# Patient Record
Sex: Male | Born: 1935 | Race: White | Hispanic: No | Marital: Married | Smoking: Former smoker
Health system: Southern US, Community
[De-identification: ages and names within clinical notes are randomized; demographics above are authoritative.]

## PROBLEM LIST (undated history)

## (undated) DIAGNOSIS — K219 Gastro-esophageal reflux disease without esophagitis: Secondary | ICD-10-CM

## (undated) DIAGNOSIS — I2510 Atherosclerotic heart disease of native coronary artery without angina pectoris: Secondary | ICD-10-CM

## (undated) DIAGNOSIS — D649 Anemia, unspecified: Secondary | ICD-10-CM

## (undated) DIAGNOSIS — I38 Endocarditis, valve unspecified: Secondary | ICD-10-CM

## (undated) DIAGNOSIS — K2270 Barrett's esophagus without dysplasia: Secondary | ICD-10-CM

## (undated) DIAGNOSIS — G5600 Carpal tunnel syndrome, unspecified upper limb: Secondary | ICD-10-CM

## (undated) DIAGNOSIS — I480 Paroxysmal atrial fibrillation: Secondary | ICD-10-CM

## (undated) DIAGNOSIS — Z972 Presence of dental prosthetic device (complete) (partial): Secondary | ICD-10-CM

## (undated) DIAGNOSIS — M6740 Ganglion, unspecified site: Secondary | ICD-10-CM

## (undated) DIAGNOSIS — K449 Diaphragmatic hernia without obstruction or gangrene: Secondary | ICD-10-CM

## (undated) DIAGNOSIS — I739 Peripheral vascular disease, unspecified: Secondary | ICD-10-CM

## (undated) DIAGNOSIS — H9190 Unspecified hearing loss, unspecified ear: Secondary | ICD-10-CM

## (undated) DIAGNOSIS — I779 Disorder of arteries and arterioles, unspecified: Secondary | ICD-10-CM

## (undated) DIAGNOSIS — Z973 Presence of spectacles and contact lenses: Secondary | ICD-10-CM

## (undated) DIAGNOSIS — Z7901 Long term (current) use of anticoagulants: Secondary | ICD-10-CM

## (undated) DIAGNOSIS — R001 Bradycardia, unspecified: Secondary | ICD-10-CM

## (undated) DIAGNOSIS — N400 Enlarged prostate without lower urinary tract symptoms: Secondary | ICD-10-CM

## (undated) DIAGNOSIS — D126 Benign neoplasm of colon, unspecified: Secondary | ICD-10-CM

## (undated) DIAGNOSIS — K648 Other hemorrhoids: Secondary | ICD-10-CM

## (undated) DIAGNOSIS — E039 Hypothyroidism, unspecified: Secondary | ICD-10-CM

## (undated) DIAGNOSIS — Z950 Presence of cardiac pacemaker: Secondary | ICD-10-CM

## (undated) DIAGNOSIS — I4892 Unspecified atrial flutter: Secondary | ICD-10-CM

## (undated) DIAGNOSIS — I5032 Chronic diastolic (congestive) heart failure: Secondary | ICD-10-CM

## (undated) DIAGNOSIS — G4733 Obstructive sleep apnea (adult) (pediatric): Secondary | ICD-10-CM

## (undated) DIAGNOSIS — M1990 Unspecified osteoarthritis, unspecified site: Secondary | ICD-10-CM

## (undated) DIAGNOSIS — I10 Essential (primary) hypertension: Secondary | ICD-10-CM

## (undated) DIAGNOSIS — K4090 Unilateral inguinal hernia, without obstruction or gangrene, not specified as recurrent: Secondary | ICD-10-CM

## (undated) DIAGNOSIS — M810 Age-related osteoporosis without current pathological fracture: Secondary | ICD-10-CM

## (undated) DIAGNOSIS — E785 Hyperlipidemia, unspecified: Secondary | ICD-10-CM

## (undated) DIAGNOSIS — D509 Iron deficiency anemia, unspecified: Secondary | ICD-10-CM

## (undated) HISTORY — DX: Benign neoplasm of colon, unspecified: D12.6

## (undated) HISTORY — DX: Anemia, unspecified: D64.9

## (undated) HISTORY — DX: Hyperlipidemia, unspecified: E78.5

## (undated) HISTORY — DX: Atherosclerotic heart disease of native coronary artery without angina pectoris: I25.10

## (undated) HISTORY — DX: Essential (primary) hypertension: I10

## (undated) HISTORY — DX: Paroxysmal atrial fibrillation: I48.0

## (undated) HISTORY — DX: Barrett's esophagus without dysplasia: K22.70

## (undated) HISTORY — DX: Iron deficiency anemia, unspecified: D50.9

## (undated) HISTORY — PX: COLONOSCOPY W/ BIOPSIES AND POLYPECTOMY: SHX1376

## (undated) HISTORY — DX: Unspecified atrial flutter: I48.92

## (undated) HISTORY — DX: Long term (current) use of anticoagulants: Z79.01

## (undated) HISTORY — DX: Peripheral vascular disease, unspecified: I73.9

## (undated) HISTORY — DX: Disorder of arteries and arterioles, unspecified: I77.9

## (undated) HISTORY — PX: KNEE ARTHROSCOPY: SUR90

## (undated) HISTORY — DX: Endocarditis, valve unspecified: I38

## (undated) HISTORY — DX: Obstructive sleep apnea (adult) (pediatric): G47.33

## (undated) HISTORY — PX: HERNIA REPAIR: SHX51

## (undated) HISTORY — PX: AORTIC VALVE REPLACEMENT: SHX41

## (undated) HISTORY — PX: TOTAL HIP ARTHROPLASTY: SHX124

## (undated) HISTORY — DX: Age-related osteoporosis without current pathological fracture: M81.0

## (undated) HISTORY — DX: Benign prostatic hyperplasia without lower urinary tract symptoms: N40.0

## (undated) HISTORY — DX: Carpal tunnel syndrome, unspecified upper limb: G56.00

## (undated) HISTORY — DX: Chronic diastolic (congestive) heart failure: I50.32

## (undated) HISTORY — DX: Bradycardia, unspecified: R00.1

## (undated) HISTORY — DX: Diaphragmatic hernia without obstruction or gangrene: K44.9

## (undated) HISTORY — PX: PATELLA FRACTURE SURGERY: SHX735

## (undated) HISTORY — DX: Other hemorrhoids: K64.8

## (undated) HISTORY — DX: Hypothyroidism, unspecified: E03.9

---
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
## 2006-04-29 ENCOUNTER — Ambulatory Visit: Payer: Self-pay | Admitting: Cardiology

## 2006-05-06 ENCOUNTER — Ambulatory Visit: Payer: Self-pay | Admitting: Cardiology

## 2006-05-12 ENCOUNTER — Inpatient Hospital Stay (HOSPITAL_BASED_OUTPATIENT_CLINIC_OR_DEPARTMENT_OTHER): Admission: RE | Admit: 2006-05-12 | Discharge: 2006-05-12 | Payer: Self-pay | Admitting: Cardiology

## 2006-05-12 ENCOUNTER — Ambulatory Visit: Payer: Self-pay | Admitting: Cardiology

## 2006-05-31 ENCOUNTER — Ambulatory Visit: Payer: Self-pay | Admitting: Cardiology

## 2006-06-04 ENCOUNTER — Ambulatory Visit: Payer: Self-pay | Admitting: Cardiology

## 2006-07-27 HISTORY — PX: CORONARY ARTERY BYPASS GRAFT: SHX141

## 2006-09-13 ENCOUNTER — Ambulatory Visit: Payer: Self-pay | Admitting: Cardiology

## 2006-09-14 ENCOUNTER — Ambulatory Visit: Payer: Self-pay | Admitting: Cardiology

## 2006-09-24 ENCOUNTER — Ambulatory Visit: Payer: Self-pay | Admitting: Cardiology

## 2006-10-11 ENCOUNTER — Ambulatory Visit: Payer: Self-pay | Admitting: Cardiology

## 2006-10-12 ENCOUNTER — Ambulatory Visit: Payer: Self-pay | Admitting: Cardiology

## 2006-11-23 ENCOUNTER — Ambulatory Visit: Payer: Self-pay | Admitting: Cardiology

## 2007-05-17 ENCOUNTER — Ambulatory Visit: Payer: Self-pay | Admitting: Cardiology

## 2007-05-24 ENCOUNTER — Ambulatory Visit: Payer: Self-pay | Admitting: Cardiology

## 2007-07-28 HISTORY — PX: CARPAL TUNNEL RELEASE: SHX101

## 2007-07-28 HISTORY — PX: PACEMAKER INSERTION: SHX728

## 2007-08-11 ENCOUNTER — Ambulatory Visit: Payer: Self-pay | Admitting: Cardiology

## 2007-11-21 ENCOUNTER — Ambulatory Visit: Payer: Self-pay | Admitting: Cardiology

## 2008-01-07 ENCOUNTER — Encounter: Payer: Self-pay | Admitting: Cardiology

## 2008-01-09 ENCOUNTER — Ambulatory Visit: Payer: Self-pay | Admitting: Cardiology

## 2008-01-09 ENCOUNTER — Ambulatory Visit: Payer: Self-pay | Admitting: Internal Medicine

## 2008-01-09 ENCOUNTER — Inpatient Hospital Stay (HOSPITAL_COMMUNITY): Admission: AD | Admit: 2008-01-09 | Discharge: 2008-01-11 | Payer: Self-pay | Admitting: Cardiology

## 2008-01-23 ENCOUNTER — Ambulatory Visit: Payer: Self-pay

## 2008-02-02 ENCOUNTER — Ambulatory Visit: Payer: Self-pay | Admitting: Cardiology

## 2008-02-28 ENCOUNTER — Ambulatory Visit (HOSPITAL_BASED_OUTPATIENT_CLINIC_OR_DEPARTMENT_OTHER): Admission: RE | Admit: 2008-02-28 | Discharge: 2008-02-28 | Payer: Self-pay | Admitting: Orthopedic Surgery

## 2008-03-13 ENCOUNTER — Ambulatory Visit: Payer: Self-pay | Admitting: Cardiology

## 2008-04-20 ENCOUNTER — Ambulatory Visit: Payer: Self-pay | Admitting: Internal Medicine

## 2008-06-28 ENCOUNTER — Ambulatory Visit: Payer: Self-pay | Admitting: Cardiology

## 2008-07-02 ENCOUNTER — Encounter: Payer: Self-pay | Admitting: Cardiology

## 2008-07-02 LAB — CONVERTED CEMR LAB
ALT: 15 units/L
AST: 19 units/L
Alkaline Phosphatase: 65 units/L
Total Bilirubin: 0.8 mg/dL
Total Protein: 6.6 g/dL

## 2008-07-10 ENCOUNTER — Encounter: Payer: Self-pay | Admitting: Cardiology

## 2008-07-12 ENCOUNTER — Ambulatory Visit: Payer: Self-pay | Admitting: Cardiology

## 2008-08-13 ENCOUNTER — Ambulatory Visit: Payer: Self-pay | Admitting: Cardiology

## 2008-08-17 ENCOUNTER — Ambulatory Visit: Payer: Self-pay | Admitting: Internal Medicine

## 2008-10-15 ENCOUNTER — Encounter: Payer: Self-pay | Admitting: Cardiology

## 2008-12-25 ENCOUNTER — Encounter: Payer: Self-pay | Admitting: Cardiology

## 2009-01-11 ENCOUNTER — Encounter: Payer: Self-pay | Admitting: Cardiology

## 2009-01-23 ENCOUNTER — Ambulatory Visit: Payer: Self-pay | Admitting: Cardiology

## 2009-02-01 ENCOUNTER — Encounter (INDEPENDENT_AMBULATORY_CARE_PROVIDER_SITE_OTHER): Payer: Self-pay | Admitting: *Deleted

## 2009-02-12 ENCOUNTER — Encounter: Payer: Self-pay | Admitting: Cardiology

## 2009-02-25 ENCOUNTER — Encounter (INDEPENDENT_AMBULATORY_CARE_PROVIDER_SITE_OTHER): Payer: Self-pay | Admitting: *Deleted

## 2009-04-10 DIAGNOSIS — I4892 Unspecified atrial flutter: Secondary | ICD-10-CM | POA: Insufficient documentation

## 2009-04-10 DIAGNOSIS — I429 Cardiomyopathy, unspecified: Secondary | ICD-10-CM | POA: Insufficient documentation

## 2009-04-10 DIAGNOSIS — I2510 Atherosclerotic heart disease of native coronary artery without angina pectoris: Secondary | ICD-10-CM | POA: Insufficient documentation

## 2009-04-10 DIAGNOSIS — I4891 Unspecified atrial fibrillation: Secondary | ICD-10-CM | POA: Insufficient documentation

## 2009-04-10 DIAGNOSIS — I6529 Occlusion and stenosis of unspecified carotid artery: Secondary | ICD-10-CM | POA: Insufficient documentation

## 2009-04-10 DIAGNOSIS — I119 Hypertensive heart disease without heart failure: Secondary | ICD-10-CM | POA: Insufficient documentation

## 2009-04-10 DIAGNOSIS — I498 Other specified cardiac arrhythmias: Secondary | ICD-10-CM | POA: Insufficient documentation

## 2009-06-07 ENCOUNTER — Telehealth (INDEPENDENT_AMBULATORY_CARE_PROVIDER_SITE_OTHER): Payer: Self-pay | Admitting: *Deleted

## 2009-06-11 ENCOUNTER — Ambulatory Visit (HOSPITAL_BASED_OUTPATIENT_CLINIC_OR_DEPARTMENT_OTHER): Admission: RE | Admit: 2009-06-11 | Discharge: 2009-06-11 | Payer: Self-pay | Admitting: Orthopedic Surgery

## 2009-06-11 ENCOUNTER — Encounter (INDEPENDENT_AMBULATORY_CARE_PROVIDER_SITE_OTHER): Payer: Self-pay | Admitting: Orthopedic Surgery

## 2009-08-26 ENCOUNTER — Ambulatory Visit: Payer: Self-pay | Admitting: Cardiology

## 2009-08-30 ENCOUNTER — Encounter (INDEPENDENT_AMBULATORY_CARE_PROVIDER_SITE_OTHER): Payer: Self-pay | Admitting: *Deleted

## 2009-08-30 ENCOUNTER — Telehealth (INDEPENDENT_AMBULATORY_CARE_PROVIDER_SITE_OTHER): Payer: Self-pay | Admitting: *Deleted

## 2009-09-05 ENCOUNTER — Encounter: Payer: Self-pay | Admitting: Cardiology

## 2009-09-05 ENCOUNTER — Ambulatory Visit: Payer: Self-pay | Admitting: Cardiology

## 2009-09-12 ENCOUNTER — Encounter (INDEPENDENT_AMBULATORY_CARE_PROVIDER_SITE_OTHER): Payer: Self-pay | Admitting: *Deleted

## 2009-09-25 ENCOUNTER — Encounter: Payer: Self-pay | Admitting: Cardiology

## 2009-10-11 ENCOUNTER — Ambulatory Visit: Payer: Self-pay | Admitting: Internal Medicine

## 2009-10-14 ENCOUNTER — Encounter: Payer: Self-pay | Admitting: Internal Medicine

## 2009-10-14 LAB — CONVERTED CEMR LAB
ALT: 21 units/L (ref 0–53)
AST: 24 units/L (ref 0–37)
Albumin: 3.8 g/dL (ref 3.5–5.2)
Alkaline Phosphatase: 67 units/L (ref 39–117)
BUN: 19 mg/dL (ref 6–23)
Bilirubin, Direct: 0.1 mg/dL (ref 0.0–0.3)
CO2: 30 meq/L (ref 19–32)
Calcium: 9.2 mg/dL (ref 8.4–10.5)
Chloride: 104 meq/L (ref 96–112)
Creatinine, Ser: 1.1 mg/dL (ref 0.4–1.5)
Free T4: 0.9 ng/dL (ref 0.6–1.6)
GFR calc non Af Amer: 68.71 mL/min (ref >60)
Glucose, Bld: 83 mg/dL (ref 70–99)
Potassium: 4 meq/L (ref 3.5–5.1)
Sodium: 140 meq/L (ref 135–145)
TSH: 6.19 microintl units/mL — ABNORMAL HIGH (ref 0.35–5.50)
Total Bilirubin: 0.6 mg/dL (ref 0.3–1.2)
Total Protein: 6.8 g/dL (ref 6.0–8.3)

## 2009-11-01 ENCOUNTER — Encounter: Payer: Self-pay | Admitting: Cardiology

## 2009-11-01 DIAGNOSIS — G4733 Obstructive sleep apnea (adult) (pediatric): Secondary | ICD-10-CM | POA: Insufficient documentation

## 2009-12-09 ENCOUNTER — Encounter: Payer: Self-pay | Admitting: Cardiology

## 2010-01-20 ENCOUNTER — Encounter: Payer: Self-pay | Admitting: Cardiology

## 2010-01-23 ENCOUNTER — Encounter (INDEPENDENT_AMBULATORY_CARE_PROVIDER_SITE_OTHER): Payer: Self-pay | Admitting: *Deleted

## 2010-04-14 ENCOUNTER — Ambulatory Visit: Payer: Self-pay | Admitting: Internal Medicine

## 2010-04-18 ENCOUNTER — Ambulatory Visit: Payer: Self-pay | Admitting: Cardiology

## 2010-05-23 ENCOUNTER — Encounter (INDEPENDENT_AMBULATORY_CARE_PROVIDER_SITE_OTHER): Payer: Self-pay | Admitting: *Deleted

## 2010-06-30 ENCOUNTER — Encounter: Payer: Self-pay | Admitting: Cardiology

## 2010-07-23 ENCOUNTER — Encounter: Payer: Self-pay | Admitting: Cardiology

## 2010-08-26 NOTE — Letter (Signed)
Summary: Engineer, materials at [facility]  [address]   [place], [place] [address]   Phone: [phone]  Fax: [phone]    February 25, 2009 MRN: [id number]   [name] [address] [place], [place]  [address]   Dear [name],  Your test ordered by Selena Batten has been reviewed by your physician (or physician assistant) and was found to be normal or stable. Your physician (or physician assistant) felt no changes were needed at this time.  ____ Echocardiogram  ____ Cardiac Stress Test  __X__ Lab Work (02/12/09)  ____ Peripheral vascular study of arms, legs or neck  ____ CT scan or X-ray  ____ Lung or Breathing test  ____ Other:   Thank you.   Cyril Loosen, RN, BSN    Duane Boston, M.D., F.A.C.C. Thressa Sheller, M.D., F.A.C.C. Oneal Grout, M.D., F.A.C.C. Cheree Ditto, M.D., F.A.C.C. Daiva Nakayama, M.D., F.A.C.C. Kenney Houseman, M.D., F.A.C.C. Jeanne Ivan, PA-C

## 2010-08-26 NOTE — Miscellaneous (Signed)
Summary: refill amiodarone 200mg   Clinical Lists Changes  Medications: Added new medication of AMIODARONE HCL 200 MG TABS (AMIODARONE HCL) take 1 tablet daily - Signed Rx of AMIODARONE HCL 200 MG TABS (AMIODARONE HCL) take 1 tablet daily;  #30 x 6;  Signed;  Entered by: Dreama Saa, CNA;  Authorized by: Lewayne Bunting, MD, [facility];  Method [facility]  [address] [id number] [address] [place], [place]  [address], Ph: [phone], Fax: [phone]    Prescriptions: AMIODARONE HCL 200 MG TABS (AMIODARONE HCL) take 1 tablet daily  #30 x 6   Entered by:   Dreama Saa, CNA   Authorized by:   Lewayne Bunting, MD, [facility]   Signed by:   Dreama Saa, CNA on 02/01/2009   Method used:   [facility]        [facility]  [address] [phone]* (retail)       [address]       [place], [place]  [address]       Ph: [phone]       Fax: [phone]   RxID:   [id number]

## 2010-08-26 NOTE — Assessment & Plan Note (Signed)
Summary: 6 MONTH FU -RECV LETTER VS   Visit Type:  Follow-up Primary Provider:  Criss Alvine  CC:  follow-up visit.  History of Present Illness: the patient is a 77 year old male with a history of left-sided atrial flutter remaining now an AV sequential paced rhythm. The patient has a history of aortic valve replacement and mitral valve repair as well as Cox-Maze procedure. He remains on amiodarone therapy. Related was some concern that the patient had gait instability versus neurotoxicity from amiodarone, however this was very unlikely given the patient's significant problems with degenerative hip disorder and no definite neurological symptoms. Patient's main complaint is noting occasional blood in his mouth usually in the morning. He also had a hematoma drained from his right hand. Patient is requesting whether he needs to stay on both aspirin and warfarin and we discussed his in the office today. The patient denies any chest pain shortness of breath apnea PND. His ejection fraction normalized to 55-60% in June of 2009. However he did not have a recent echocardiogram. He denies any palpitations or syncope.  Preventive Screening-Counseling & Management  Alcohol-Tobacco     Smoking Status: quit     Year Quit: 1956  Current Medications (verified): 1)  Amlodipine Besylate 10 Mg Tabs (Amlodipine Besylate) .... Take 1 Tablet By Mouth Once A Day 2)  Aspirin 81 Mg Tabs (Aspirin) .... Take One Tab By Mouth Every Other Day 3)  Benazepril Hcl 40 Mg Tabs (Benazepril Hcl) .... Take 1 Tablet By Mouth Once A Day 4)  Carvedilol 6.25 Mg Tabs (Carvedilol) .... Take 1 Tablet By Mouth Two Times A Day 5)  Warfarin Sodium 2 Mg Tabs (Warfarin Sodium) .... Use As Directed 6)  Furosemide 40 Mg Tabs (Furosemide) .... Take 1 1/2 Tablet By Mouth Two Times A Day 7)  Klor-Con M20 20 Meq Cr-Tabs (Potassium Chloride Crys Cr) .... Take 1 Tablet By Mouth Two Times A Day 8)  Oxybutynin Chloride 5 Mg Xr24h-Tab (Oxybutynin  Chloride) .... Take 1 Tablet By Mouth Once A Day 9)  Pacerone 200 Mg Tabs (Amiodarone Hcl) .... Take 1 Tablet By Mouth Once A Day 10)  Pravastatin Sodium 40 Mg Tabs (Pravastatin Sodium) .... Take 1 Tablet By Mouth At Bedtime 11)  Multivitamins  Tabs (Multiple Vitamin) .... Take 1 Tablet By Mouth Once A Day 12)  Metamucil 30.9 % Powd (Psyllium) .... Daily 13)  Nitrostat 0.4 Mg Subl (Nitroglycerin) .... Use As Directed  Allergies (verified): No Known Drug Allergies  Comments:  Nurse/Medical Assistant: The patient's medications and allergies were reviewed with the patient and were updated in the Medication and Allergy Lists. List reviewed.  Past History:  Past Surgical History: Last updated: 04/10/2009 CABG Hip Arthroplasty-Total Knee Arthroscopy Carpal tunnel pacemaker Valve Replacement-Aortic (S/P)  Past Medical History: 1. Symptomatic bradycardia status post dual-chamber pacemaker     implantation Medtronic device on January 10, 2008. 2. Recurrent left-sided atrial flutter.     a.     Amiodarone therapy.     b.     Coumadin therapy.     c.     Prior history of cardioversion and overdrive pacing.     d.     Maintenance on normal sinus rhythm. 3. Mixed ischemic and nonischemic cardiomyopathy with previous     ejection fraction of 35-40%, most recently in June 2009 normalized     to 55-60%. 4. Coronary artery disease status post coronary artery bypass grafting     x2 at [facility] in September 2008.  5. Valvular heart disease.     a.     Status post aortic valve replacement to bioprosthetic valve      at [facility] in September 2008.     b.     Status post mitral valve repair secondary to mitral      regurgitation in December 2008 also at [facility].     c.     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 6. History of atrial fibrillation status post Cox maze procedure. 7. Chronic Coumadin therapy. 8. Less than 50% bilateral internal carotid artery stenosis by      Dopplers 2009. 9. Hypertension, controlled. 10.Benign prostatic hypertrophy. 11.Left hip and thigh fusion at age 17. 12.Status post left total hip replacement at age 36. 13.Carpal tunnel syndrome.CAROTID ARTERY STENOSIS, WITHOUT INFARCTION (ICD-433.10) HYPERTENSION, UNSPECIFIED (ICD-401.9) ATRIAL FIBRILLATION (ICD-427.31) CAD, ARTERY BYPASS GRAFT (ICD-414.04) CARDIOMYOPATHY, ISCHEMIC (ICD-414.8) ATRIAL FLUTTER (ICD-427.32) BRADYCARDIA (ICD-427.89) PACEMAKER (ICD-V45.Marland Kitchen01) Benign prostatic hypertrophy.  Carpal tunnel syndrome.   Family History: Reviewed history and no changes required. Negative FH of Diabetes, Hypertension, or Coronary Artery Disease  Social History: Reviewed history from 04/10/2009 and no changes required. Married  Tobacco Use - No.  Alcohol Use - no Smoking Status:  quit  Review of Systems       The patient complains of coughing up blood, muscle weakness, and joint pain.    Vital Signs:  Patient profile:   77 year old male Height:      67 inches Weight:      184 pounds BMI:     28.92 Pulse rate:   75 / minute BP sitting:   116 / 73  (left arm) Cuff size:   large  Vitals Entered By: Carlye Grippe (August 26, 2009 11:09 AM)  Nutrition Counseling: Patient's BMI is greater than 25 and therefore counseled on weight management options. CC: follow-up visit   Physical Exam  Additional Exam:  General: Well-developed, well-nourished in no distress head: Normocephalic and atraumatic eyes PERRLA/EOMI intact, conjunctiva and lids normal nose: No deformity or lesions mouth normal dentition, normal posterior pharynx neck: Supple, no JVD.  No masses, thyromegaly or abnormal cervical nodes lungs: Normal breath sounds bilaterally without wheezing.  Normal percussion heart: regular rate and rhythm with normal S1 and S2, no S3 or S4.  PMI is normal.  No pathological murmurs abdomen: Normal bowel sounds, abdomen is soft and nontender without masses, organomegaly  or hernias noted.  No hepatosplenomegaly musculoskeletal: Back normal, difficulty with gait due to chronic hip problems. pulsus: Pulse is normal in all 4 extremities Extremities: No peripheral pitting edema neurologic: Alert and oriented x 3 skin: Intact without lesions or rashes cervical nodes: No significant adenopathy psychologic: Normal affect    EKG  Procedure date:  08/26/2009  Findings:      AV sequential paced rhythm heart rate 75 beats per minute  Impression & Recommendations:  Problem # 1:  ATRIAL FLUTTER (ICD-427.32) the patient has a history of atrial flutter. He now remains in AV sequential paced rhythm on amiodarone therapy. His laboratory work is followed by Dr. Criss Alvine. No definite evidence of major side effects due to amiodarone.of note is that the patient is also status post Cox-Maze procedure at the time of his bypass surgery. The following medications were removed from the medication list:    Amiodarone Hcl 200 Mg Tabs (Amiodarone hcl) .Marland Kitchen... Take 1 tablet daily His updated medication list for this problem includes:    Aspirin 81 Mg Tabs (Aspirin) .Marland Kitchen... Take  one tab by mouth every other day    Carvedilol 6.25 Mg Tabs (Carvedilol) .Marland Kitchen... Take 1 tablet by mouth two times a day    Warfarin Sodium 2 Mg Tabs (Warfarin sodium) ..... Use as directed    Pacerone 200 Mg Tabs (Amiodarone hcl) .Marland Kitchen... Take 1 tablet by mouth once a day  Problem # 2:  CARDIOMYOPATHY, ISCHEMIC (ICD-414.8) the patient has a mixed ischemic and nonischemic cardiomyopathy but his ejection fraction has normalized 55-60%. However there's been no recent evaluation with an echocardiogram and this has been ordered. The following medications were removed from the medication list:    Amiodarone Hcl 200 Mg Tabs (Amiodarone hcl) .Marland Kitchen... Take 1 tablet daily His updated medication list for this problem includes:    Amlodipine Besylate 10 Mg Tabs (Amlodipine besylate) .Marland Kitchen... Take 1 tablet by mouth once a day     Aspirin 81 Mg Tabs (Aspirin) .Marland Kitchen... Take one tab by mouth every other day    Benazepril Hcl 40 Mg Tabs (Benazepril hcl) .Marland Kitchen... Take 1 tablet by mouth once a day    Carvedilol 6.25 Mg Tabs (Carvedilol) .Marland Kitchen... Take 1 tablet by mouth two times a day    Warfarin Sodium 2 Mg Tabs (Warfarin sodium) ..... Use as directed    Furosemide 40 Mg Tabs (Furosemide) .Marland Kitchen... Take 1 1/2 tablet by mouth two times a day    Pacerone 200 Mg Tabs (Amiodarone hcl) .Marland Kitchen... Take 1 tablet by mouth once a day    Nitrostat 0.4 Mg Subl (Nitroglycerin) ..... Use as directed  Problem # 3:  CAD, ARTERY BYPASS GRAFT (ICD-414.04) the patient reports no recurrent chest pain. Given his promise with bleeding I recommended to change his aspirin to every other day. We will also decrease his Coumadin level to be maintained to between 2 and 2.5. His updated medication list for this problem includes:    Amlodipine Besylate 10 Mg Tabs (Amlodipine besylate) .Marland Kitchen... Take 1 tablet by mouth once a day    Aspirin 81 Mg Tabs (Aspirin) .Marland Kitchen... Take one tab by mouth every other day    Benazepril Hcl 40 Mg Tabs (Benazepril hcl) .Marland Kitchen... Take 1 tablet by mouth once a day    Carvedilol 6.25 Mg Tabs (Carvedilol) .Marland Kitchen... Take 1 tablet by mouth two times a day    Warfarin Sodium 2 Mg Tabs (Warfarin sodium) ..... Use as directed    Nitrostat 0.4 Mg Subl (Nitroglycerin) ..... Use as directed  Problem # 4:  PACEMAKER (ICD-V45.Marland Kitchen01) apparent normal pacemaker function.  Problem # 5:  AORTIC VALVE REPLACEMENT, HX OF (ICD-V43.3) the patient is status post aortic valve replacement with no evidence of malfunction on physical exam. He has a bioprosthetic valve placed in September of 2008 and this was followed by mitral valve repair in December of 2008 at [facility].  Problem # 6:  CAROTID ARTERY STENOSIS, WITHOUT INFARCTION (ICD-433.10) the patient has less than 50% stenosis bilaterally of the internal carotid arteries by Doppler in 2009. No definite  indication for followup carotid Dopplers. No definite carotid bruits on exam. His updated medication list for this problem includes:    Aspirin 81 Mg Tabs (Aspirin) .Marland Kitchen... Take one tab by mouth every other day    Warfarin Sodium 2 Mg Tabs (Warfarin sodium) ..... Use as directed  Problem # 7:  COUMADIN THERAPY (ICD-V58.61) recommendations as outlined above.  Other Orders: EKG w/ Interpretation (93000) 2-D Echocardiogram (2D Echo)  Patient Instructions: 1)  Aspirin 81mg  every other day 2)  Decrease  Warfarin goal to:  INR 2.0 - 2.5 3)  Schedule appt. with Dr. Johney Frame  4)  Echo 5)  Follow up in  6 months.     Appended Document: Orders Update    Clinical Lists Changes  Orders: Added new Referral order of Sleep Study (Sleep Study) - Signed

## 2010-08-26 NOTE — Miscellaneous (Signed)
Summary: Orders Update - Neurology referra  Clinical Lists Changes  Problems: Added new problem of OBSTRUCTIVE SLEEP APNEA (ICD-327.23) Orders: Added new Referral order of Neurology Referral (Neuro) - Signed

## 2010-08-26 NOTE — Progress Notes (Signed)
Summary: Needs to hold coumadn for surgery  Phone Note Call from Patient Call back at Home Phone [phone]   Summary of Call: Pt's wife called stating he is having surgery on Tuesday, November 16th. He has 2 hematoma's on his hands from using the cane. Dr. Teressa Senter is going to do I & D on this. She needs to know when to stop the Coumadin. She doesn't think he needs Lovenox but needs to know for sure. His INR was 2.9 on Monday. Please advise ASAP. Initial call taken by: Cyril Loosen, RN, BSN,  June 07, 2009 12:05 PM  Follow-up for Phone Call        Discussed with Dr. Earnestine Leys. He states to confirm with patient that he has never had a stroke. As long as he has not had a stroke, he is cleared to hold coumadin without Lovenox bridging for 5 days prior to surgery and resume per surgeon.  Pt's wife notified and verbalizes understanding. Follow-up by: Cyril Loosen, RN, BSN,  June 07, 2009 12:30 PM

## 2010-08-26 NOTE — Cardiovascular Report (Signed)
Summary: Office Visit   Office Visit   Imported By: Roderic Ovens 10/23/2009 15:48:51  _____________________________________________________________________  External Attachment:    Type:   Image     Comment:   External Document

## 2010-08-26 NOTE — Miscellaneous (Signed)
Summary: [id number]  Clinical Lists Changes  Medications: Changed medication from FUROSEMIDE 40 MG TABS (FUROSEMIDE) Take 1 tablet by mouth every eight hours to FUROSEMIDE 40 MG TABS (FUROSEMIDE) Take one tablet by mouth two times a day - Signed Removed medication of KLOR-CON 20 MEQ PACK (POTASSIUM CHLORIDE) Take 1 packet by mouth every twelve hours Changed medication from KLOR-CON M20 20 MEQ CR-TABS (POTASSIUM CHLORIDE CRYS CR) Take 1 tablet by mouth once a day to KLOR-CON M20 20 MEQ CR-TABS (POTASSIUM CHLORIDE CRYS CR) Take 1 tablet by mouth two times a day - Signed Rx of FUROSEMIDE 40 MG TABS (FUROSEMIDE) Take one tablet by mouth two times a day;  #60 x 6;  Signed;  Entered by: Cyril Loosen, RN, BSN;  Authorized by: Lewayne Bunting, MD, [facility];  Method [facility]  [address] [id number] [address] [place], [place]  [address], Ph: [phone], Fax: [phone] Rx of KLOR-CON M20 20 MEQ CR-TABS (POTASSIUM CHLORIDE CRYS CR) Take 1 tablet by mouth two times a day;  #60 x 6;  Signed;  Entered by: Cyril Loosen, RN, BSN;  Authorized by: Lewayne Bunting, MD, [facility];  Method used: [facility]  [address] [address] [place], [place]  [address], Ph: [phone], Fax: [phone]    Prescriptions: KLOR-CON M20 20 MEQ CR-TABS (POTASSIUM CHLORIDE CRYS CR) Take 1 tablet by mouth two times a day  #60 x 6   Entered by:   Cyril Loosen, RN, BSN   Authorized by:   Lewayne Bunting, MD, [facility]   Signed by:   Cyril Loosen, RN, BSN on 12/25/2008   Method used:   Electronically to        [facility]  [address] [phone]* (retail)       [address]       [place], [place]  [address]       Ph: [phone]       Fax: [phone]   RxID:   [id number] FUROSEMIDE 40 MG TABS (FUROSEMIDE) Take one tablet by mouth two times a day  #60 x 6   Entered by:   Cyril Loosen, RN, BSN   Authorized by:   Lewayne Bunting, MD, [facility]   Signed by:   Cyril Loosen, RN, BSN on 12/25/2008  Method used:   [facility]        [facility]   [phone][facility] (retail)       [address]       [place], [place]  [address]       Ph: [phone]       Fax: [phone]   RxID:   [id number]

## 2010-08-26 NOTE — Cardiovascular Report (Signed)
Summary: Office Visit   Office Visit   Imported By: Roderic Ovens 04/14/2010 16:18:07  _____________________________________________________________________  External Attachment:    Type:   Image     Comment:   External Document

## 2010-08-26 NOTE — Assessment & Plan Note (Signed)
Summary: f38m  --agh   Visit Type:  Follow-up Primary Provider:  Criss Alvine   History of Present Illness: the patient is a 77 year old male with a history of left-sided atrial flutter, status post aortic valve replacement and mitral valve repair as well as Cox Maze procedure. The patient status post pacemaker implantation and is in an AV sequential paced rhythm. He is on amiodarone. He has been doing well from a cardiac standpoint. He denies any chest pain short of breath orthopnea or PND. Last echocardiogram in February 2011 demonstrates an ejection fraction of 55-60%. He denies any palpitations or syncope. He presents for routine followup.  Preventive Screening-Counseling & Management  Alcohol-Tobacco     Smoking Status: quit     Year Quit: 1956  Current Medications (verified): 1)  Amlodipine Besylate 10 Mg Tabs (Amlodipine Besylate) .... Take 1 Tablet By Mouth Once A Day 2)  Aspirin 81 Mg Tabs (Aspirin) .... Take One Tab By Mouth Every Other Day 3)  Benazepril Hcl 40 Mg Tabs (Benazepril Hcl) .... Take 1 Tablet By Mouth Once A Day 4)  Carvedilol 6.25 Mg Tabs (Carvedilol) .... Take 1 Tablet By Mouth Two Times A Day 5)  Warfarin Sodium 2 Mg Tabs (Warfarin Sodium) .... Use As Directed 6)  Furosemide 40 Mg Tabs (Furosemide) .... Take 1 1/2 Tablet By Mouth Two Times A Day 7)  Klor-Con M20 20 Meq Cr-Tabs (Potassium Chloride Crys Cr) .... Take 1 Tablet By Mouth Two Times A Day 8)  Oxybutynin Chloride 5 Mg Xr24h-Tab (Oxybutynin Chloride) .... Take 1 Tablet By Mouth Once A Day 9)  Pacerone 200 Mg Tabs (Amiodarone Hcl) .... Take 1/2 Tab (100mg ) Daily 10)  Pravastatin Sodium 40 Mg Tabs (Pravastatin Sodium) .... Take 1 Tablet By Mouth At Bedtime 11)  Multivitamins  Tabs (Multiple Vitamin) .... Take 1 Tablet By Mouth Once A Day 12)  Metamucil 30.9 % Powd (Psyllium) .... Daily 13)  Nitrostat 0.4 Mg Subl (Nitroglycerin) .... Use As Directed 14)  Staph Vaccination/placebo .... Use As Directed Per Research  Program At [facility]  Allergies (verified): No Known Drug Allergies  Comments:  Nurse/Medical Assistant: The patient's medication list and allergies were reviewed with the patient and were updated in the Medication and Allergy Lists.  Past History:  Past Medical History: 1. Symptomatic bradycardia status post dual-chamber pacemaker     implantation Medtronic device on January 10, 2008. 2. Recurrent left-sided atrial flutter.     a.     Amiodarone therapy.     b.     Coumadin therapy.     c.     Prior history of cardioversion and overdrive pacing.     d.     Maintenance on normal sinus rhythm. 3. Mixed ischemic and nonischemic cardiomyopathy with previous     ejection fraction of 35-40%, most recently in June 2009 normalized     to 55-60%.echocardiogram February 2011 ejection fraction 55-60%, status post mitral valve repair with mild MS.  Status post aVR with no aortic insufficiency. 4. Coronary artery disease status post coronary artery bypass grafting     x2 at [facility] in September 2008. 5. Valvular heart disease.     a.     Status post aortic valve replacement to bioprosthetic valve      at [facility] in September 2008.     b.     Status post mitral valve repair secondary to mitral      regurgitation in December 2008 also at [facility].  c.     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 6. History of atrial fibrillation status post Cox maze procedure. 7. Chronic Coumadin therapy. 8. Less than 50% bilateral internal carotid artery stenosis by     Dopplers 2009. 9. Hypertension, controlled. 10.Benign prostatic hypertrophy. 11.Left hip and thigh fusion at age 67. 12.Status post left total hip replacement at age 11. 13.Carpal tunnel syndrome.CAROTID ARTERY STENOSIS, WITHOUT INFARCTION (ICD-433.10) HYPERTENSION, UNSPECIFIED (ICD-401.9) ATRIAL FIBRILLATION (ICD-427.31) CAD, ARTERY BYPASS GRAFT (ICD-414.04) CARDIOMYOPATHY, ISCHEMIC (ICD-414.8) ATRIAL FLUTTER  (ICD-427.32) BRADYCARDIA (ICD-427.89) PACEMAKER (ICD-V45.Marland Kitchen01) Benign prostatic hypertrophy.  Carpal tunnel syndrome.   Review of Systems  The patient denies fatigue, malaise, fever, weight gain/loss, vision loss, decreased hearing, hoarseness, chest pain, palpitations, shortness of breath, prolonged cough, wheezing, sleep apnea, coughing up blood, abdominal pain, blood in stool, nausea, vomiting, diarrhea, heartburn, incontinence, blood in urine, muscle weakness, joint pain, leg swelling, rash, skin lesions, headache, fainting, dizziness, depression, anxiety, enlarged lymph nodes, easy bruising or bleeding, and environmental allergies.    Vital Signs:  Patient profile:   77 year old male Height:      67 inches Weight:      185 pounds Pulse rate:   71 / minute BP sitting:   113 / 69  (left arm) Cuff size:   regular  Vitals Entered By: Carlye Grippe (April 18, 2010 9:27 AM)  Physical Exam  Additional Exam:  General: Well-developed, well-nourished in no distress head: Normocephalic and atraumatic eyes PERRLA/EOMI intact, conjunctiva and lids normal nose: No deformity or lesions mouth normal dentition, normal posterior pharynx neck: Supple, no JVD.  No masses, thyromegaly or abnormal cervical nodes lungs: Normal breath sounds bilaterally without wheezing.  Normal percussion heart: regular rate and rhythm with normal S1 and S2, no S3 or S4.  PMI is normal.  No pathological murmurs abdomen: Normal bowel sounds, abdomen is soft and nontender without masses, organomegaly or hernias noted.  No hepatosplenomegaly musculoskeletal: Back normal, difficulty with gait due to chronic hip problems. pulsus: Pulse is normal in all 4 extremities Extremities: No peripheral pitting edema neurologic: Alert and oriented x 3 skin: Intact without lesions or rashes cervical nodes: No significant adenopathy psychologic: Normal affect    EKG  Procedure date:  04/18/2010  Findings:      AV  sequential paced rhythm. Heart rate 73 beats per minute  PPM Specifications Following MD:  Hillis Range, MD     PPM Vendor:  Medtronic     PPM Model Number:  [id number]     PPM Serial Number:  [id number] PPM DOI:  01/10/2008     PPM Implanting MD:  Sherryl Manges, MD  Lead 1    Location: RA     DOI: 01/10/2008     Model #: [id number]     Serial #: [id number]     Status: active Lead 2    Location: RV     DOI: 01/10/2008     Model #: [id number]     Serial #: [id number]     Status: active   Indications:  SSS, AFIB   PPM Follow Up Pacer Dependent:  No      Episodes Coumadin:  Yes  Parameters Mode:  DDDR     Lower Rate Limit:  70     Upper Rate Limit:  120 Paced AV Delay:  320     Sensed AV Delay:  300  Impression & Recommendations:  Problem # 1:  OBSTRUCTIVE SLEEP APNEA (ICD-327.23) Assessment Comment Only  Problem #  2:  COUMADIN THERAPY (ICD-V58.61) Assessment: Comment Only  Problem # 3:  AORTIC VALVE REPLACEMENT, HX OF (ICD-V43.3) normal valve function by recent echocardiogram  Problem # 4:  ATRIAL FIBRILLATION (ICD-427.31) no recurrence of atrial arrhythmia. We will further decrease the patient's amiodarone to 100 mg p.o. q. daily His updated medication list for this problem includes:    Aspirin 81 Mg Tabs (Aspirin) .Marland Kitchen... Take one tab by mouth every other day    Carvedilol 6.25 Mg Tabs (Carvedilol) .Marland Kitchen... Take 1 tablet by mouth two times a day    Warfarin Sodium 2 Mg Tabs (Warfarin sodium) ..... Use as directed    Pacerone 200 Mg Tabs (Amiodarone hcl) .Marland Kitchen... Take 1/2 tab (100mg ) daily  Orders: EKG w/ Interpretation (93000)  Problem # 5:  PACEMAKER (ICD-V45.Marland Kitchen01) Assessment: Comment Only  Patient Instructions: 1)  Decrease Amiodarone to 100mg  daily 2)  Follow up in  6 months

## 2010-08-26 NOTE — Letter (Signed)
Summary: Handout Printed  Printed Handout:  - Echocardiogram (ECHO) 

## 2010-08-26 NOTE — Letter (Signed)
Summary: Engineer, materials at [facility]  [address]   [place], [place] [address]   Phone: [phone]  Fax: [phone]        September 12, 2009 MRN: [id number]   RHONE OZAKI [address] [place], [place]  [address]   Dear Mr. KOHLBECK,  Your test ordered by Selena Batten has been reviewed by your physician (or physician assistant) and was found to be normal or stable. Your physician (or physician assistant) felt no changes were needed at this time.  __X__ Echocardiogram  ____ Cardiac Stress Test  ____ Lab Work  ____ Peripheral vascular study of arms, legs or neck  ____ CT scan or X-ray  ____ Lung or Breathing test  ____ Other:   Thank you.   Hoover Brunette, LPN    Duane Boston, M.D., F.A.C.C. Thressa Sheller, M.D., F.A.C.C. Oneal Grout, M.D., F.A.C.C. Cheree Ditto, M.D., F.A.C.C. Daiva Nakayama, M.D., F.A.C.C. Kenney Houseman, M.D., F.A.C.C. Jeanne Ivan, PA-C

## 2010-08-26 NOTE — Assessment & Plan Note (Signed)
Summary: 6 MONTH ROV.SL   Visit Type:  Follow-up Primary Provider:  Criss Alvine   History of Present Illness: The patient presents today for routine electrophysiology followup. He reports doing very well since last being seen in our clinic. The patient denies symptoms of palpitations, chest pain, shortness of breath, orthopnea, PND, lower extremity edema,  presyncope, syncope, or neurologic sequela. He reports rare dizziness. The patient is tolerating medications without difficulties and is otherwise without complaint today.   Current Medications (verified): 1)  Amlodipine Besylate 10 Mg Tabs (Amlodipine Besylate) .... Take 1 Tablet By Mouth Once A Day 2)  Aspirin 81 Mg Tabs (Aspirin) .... Take One Tab By Mouth Every Other Day 3)  Benazepril Hcl 40 Mg Tabs (Benazepril Hcl) .... Take 1 Tablet By Mouth Once A Day 4)  Carvedilol 6.25 Mg Tabs (Carvedilol) .... Take 1 Tablet By Mouth Two Times A Day 5)  Warfarin Sodium 2 Mg Tabs (Warfarin Sodium) .... Use As Directed 6)  Furosemide 40 Mg Tabs (Furosemide) .... Take 1 1/2 Tablet By Mouth Two Times A Day 7)  Klor-Con M20 20 Meq Cr-Tabs (Potassium Chloride Crys Cr) .... Take 1 Tablet By Mouth Two Times A Day 8)  Oxybutynin Chloride 5 Mg Xr24h-Tab (Oxybutynin Chloride) .... Take 1 Tablet By Mouth Once A Day 9)  Pacerone 200 Mg Tabs (Amiodarone Hcl) .... Take 1 Tablet By Mouth Once A Day 10)  Pravastatin Sodium 40 Mg Tabs (Pravastatin Sodium) .... Take 1 Tablet By Mouth At Bedtime 11)  Multivitamins  Tabs (Multiple Vitamin) .... Take 1 Tablet By Mouth Once A Day 12)  Metamucil 30.9 % Powd (Psyllium) .... Daily 13)  Nitrostat 0.4 Mg Subl (Nitroglycerin) .... Use As Directed  Allergies (verified): No Known Drug Allergies  Past History:  Past Medical History: Reviewed history from 08/26/2009 and no changes required. 1. Symptomatic bradycardia status post dual-chamber pacemaker     implantation Medtronic device on January 10, 2008. 2. Recurrent  left-sided atrial flutter.     a.     Amiodarone therapy.     b.     Coumadin therapy.     c.     Prior history of cardioversion and overdrive pacing.     d.     Maintenance on normal sinus rhythm. 3. Mixed ischemic and nonischemic cardiomyopathy with previous     ejection fraction of 35-40%, most recently in June 2009 normalized     to 55-60%. 4. Coronary artery disease status post coronary artery bypass grafting     x2 at [facility] in September 2008. 5. Valvular heart disease.     a.     Status post aortic valve replacement to bioprosthetic valve      at [facility] in September 2008.     b.     Status post mitral valve repair secondary to mitral      regurgitation in December 2008 also at [facility].     c.     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 6. History of atrial fibrillation status post Cox maze procedure. 7. Chronic Coumadin therapy. 8. Less than 50% bilateral internal carotid artery stenosis by     Dopplers 2009. 9. Hypertension, controlled. 10.Benign prostatic hypertrophy. 11.Left hip and thigh fusion at age 53. 12.Status post left total hip replacement at age 67. 13.Carpal tunnel syndrome.CAROTID ARTERY STENOSIS, WITHOUT INFARCTION (ICD-433.10) HYPERTENSION, UNSPECIFIED (ICD-401.9) ATRIAL FIBRILLATION (ICD-427.31) CAD, ARTERY BYPASS GRAFT (ICD-414.04) CARDIOMYOPATHY, ISCHEMIC (ICD-414.8) ATRIAL FLUTTER (ICD-427.32) BRADYCARDIA (ICD-427.89) PACEMAKER (ICD-V45.Marland Kitchen01) Benign  prostatic hypertrophy.  Carpal tunnel syndrome.   Past Surgical History: Reviewed history from 04/10/2009 and no changes required. CABG Hip Arthroplasty-Total Knee Arthroscopy Carpal tunnel pacemaker Valve Replacement-Aortic (S/P)  Social History: Reviewed history from 04/10/2009 and no changes required. Married  Tobacco Use - No.  Alcohol Use - no  Review of Systems       All systems are reviewed and negative except as listed in the HPI.   Vital Signs:  Patient  profile:   77 year old male Height:      67 inches Weight:      185 pounds BMI:     29.08 O2 Sat:      92 % Pulse rate:   94 / minute BP sitting:   100 / 70  (left arm)  Vitals Entered By: Laurance Flatten CMA (April 14, 2010 10:04 AM)  Physical Exam  General:  Well developed, well nourished, in no acute distress. Head:  normocephalic and atraumatic Mouth:  Teeth, gums and palate normal. Oral mucosa normal. Neck:  Neck supple, no JVD. No masses, thyromegaly or abnormal cervical nodes. Chest Wall:  pacemaker is well healed Lungs:  Clear bilaterally to auscultation and percussion. Heart:  RRR,  no m/r/g Abdomen:  Bowel sounds positive; abdomen soft and non-tender without masses, organomegaly, or hernias noted. No hepatosplenomegaly. Msk:  walks with a cane Pulses:  pulses normal in all 4 extremities Extremities:  trace edema Neurologic:  Alert and oriented x 3.   PPM Specifications Following MD:  Hillis Range, MD     PPM Vendor:  Medtronic     PPM Model Number:  [id number]     PPM Serial Number:  [id number] PPM DOI:  01/10/2008     PPM Implanting MD:  Sherryl Manges, MD  Lead 1    Location: RA     DOI: 01/10/2008     Model #: [id number]     Serial #: [id number]     Status: active Lead 2    Location: RV     DOI: 01/10/2008     Model #: [id number]     Serial #: [id number]     Status: active   Indications:  SSS, AFIB   PPM Follow Up Battery Voltage:  2.99 V     Pacer Dependent:  No       PPM Device Measurements Atrium  Amplitude: 0.5 mV, Impedance: 568 ohms, Threshold: 1.0 V at 0.4 msec Right Ventricle  Amplitude: 16.1 mV, Impedance: 480 ohms, Threshold: 0.5 V at 0.4 msec  Episodes MS Episodes:  731     Percent Mode Switch:  9%     Coumadin:  Yes Ventricular High Rate:  0     Atrial Pacing:  83.1%     Ventricular Pacing:  99%  Parameters Mode:  DDDR     Lower Rate Limit:  70     Upper Rate Limit:  120 Paced AV Delay:  320     Sensed AV Delay:  300 Next Cardiology Appt Due:   09/25/2010 Tech Comments:  731 AF EPISODES--LONGEST WAS 45 HRS.  + COUMADIN.  NORMAL DEVICE FUNCTION.  NO CHANGES MADE. ROV IN 6 MTHS W/DEVICE CLINIC IN [place]. Vella Kohler  April 14, 2010 10:30 AM MD Comments:  agree  Impression & Recommendations:  Problem # 1:  BRADYCARDIA (ICD-427.89) Normal pacemaker function no changes today  Problem # 2:  ATRIAL FIBRILLATION (ICD-427.31) stable, minimal symptoms of afib or atypical atrial flutter with amiodarone chronically anticoagulated with coumadin  he is not a candidate for pradaxa due to valvular heart disease LFTs/TFTs stable in march,  to be followed by Dr Andee Lineman  Problem # 3:  COUMADIN THERAPY (ICD-V58.61) stable  Problem # 4:  HYPERTENSION, UNSPECIFIED (ICD-401.9) controlled consider decreasing norvasc if BP remains low due to lower extremity swelling  Patient Instructions: 1)  return in 6 months

## 2010-08-26 NOTE — Letter (Signed)
Summary: Engineer, materials at [facility]  [address]   [place], [place] [address]   Phone: [phone]  Fax: [phone]        January 23, 2010 MRN: [id number]   LADONTE VERSTRAETE [address] [place], [place]  [address]   Dear [name],  Your test ordered by Selena Batten has been reviewed by your physician (or physician assistant) and was found to be normal or stable. Your physician (or physician assistant) felt no changes were needed at this time.  ____ Echocardiogram  ____ Cardiac Stress Test  ____ Lab Work - Amiodarone levels within normal limits   ____ Peripheral vascular study of arms, legs or neck  ____ CT scan or X-ray  ____ Lung or Breathing test  ____ Other:   Thank you.   Hoover Brunette, LPN    Duane Boston, M.D., F.A.C.C. Thressa Sheller, M.D., F.A.C.C. Oneal Grout, M.D., F.A.C.C. Cheree Ditto, M.D., F.A.C.C. Daiva Nakayama, M.D., F.A.C.C. Kenney Houseman, M.D., F.A.C.C. Jeanne Ivan, PA-C

## 2010-08-26 NOTE — Progress Notes (Signed)
Summary: PHONE: SLEEP STUDY     Phone Note Call from Patient Call back at Home Phone [phone]   Caller: Spouse Details for Reason: SLEEP STUDY Summary of Call: [name] was under the impression that we were going to order a sleep study and that he may can get into a program that could be free.   09/04/2009 MR. [name] RETURNED YOUR CALL.  Initial call taken by: Zachary George,  August 30, 2009 9:56 AM  Follow-up for Phone Call        Left message to return call. Hoover Brunette, LPN  August 30, 2009 4:20 PM   Spoke with wife [name]).  After further discussion, sounds like this may be a [employer] that she is talking about.   Looks like we don't have lunch scheduled with rep. till 2/22.  Advised her of this also.  Please confirm.    Follow-up by: Hoover Brunette, LPN,  September 04, 2009 3:11 PM  Additional Follow-up for Phone Call Additional follow up Details #1::        he is indeed I was talking about [employer]-please talk with [name] about this and put patient on waiting list Additional Follow-up by: Lewayne Bunting, MD, [facility],  September 04, 2009 4:38 PM    Additional Follow-up for Phone Call Additional follow up Details #2::    Wife notified. Follow-up by: Hoover Brunette, LPN,  September 05, 2009 11:43 AM

## 2010-08-26 NOTE — Miscellaneous (Signed)
Summary: dx correction  Clinical Lists Changes  Problems: Changed problem from PACEMAKER (ICD-V45..01) to PACEMAKER, PERMANENT (ICD-V45.01)  changed the incorrect dx code to correct dx code 

## 2010-08-26 NOTE — Letter (Signed)
Summary: External Correspondence/ REFERRAL REQUEST [facility]  External Correspondence/ REFERRAL REQUEST [facility]   Imported By: Dorise Hiss 11/05/2009 11:17:11  _____________________________________________________________________  External Attachment:    Type:   Image     Comment:   External Document

## 2010-08-26 NOTE — Letter (Signed)
Summary: [employer] Order for Home Sleep Test  [facility] Order for Home Sleep Test   Imported By: Cyril Loosen, RN, BSN 09/25/2009 13:02:15  _____________________________________________________________________  External Attachment:    Type:   Image     Comment:   External Document

## 2010-08-26 NOTE — Miscellaneous (Signed)
Summary: WN:IOEVO  Clinical Lists Changes  Medications: Changed medication from FUROSEMIDE 40 MG TABS (FUROSEMIDE) Take one tablet by mouth two times a day to FUROSEMIDE 40 MG TABS (FUROSEMIDE) Take 1 1/2 tablet by mouth two times a day - Signed Rx of FUROSEMIDE 40 MG TABS (FUROSEMIDE) Take 1 1/2 tablet by mouth two times a day;  #90 x 12;  Signed;  Entered by: Cyril Loosen, RN, BSN;  Authorized by: Lewayne Bunting, MD, [facility];  Method used: [facility]  [id number] [address] [place], [place]  [address], Ph: [phone], Fax: [phone]    Prescriptions: FUROSEMIDE 40 MG TABS (FUROSEMIDE) Take 1 1/2 tablet by mouth two times a day  #90 x 12   Entered by:   Cyril Loosen, RN, BSN   Authorized by:   Lewayne Bunting, MD, [facility]   Signed by:   Cyril Loosen, RN, BSN on 01/23/2009   Method used:   [facility]  [address] [phone]* (retail)       [address]       [place], [place]  [address]       Ph: [phone]       Fax: [phone]   RxID:   [id number]

## 2010-08-26 NOTE — Assessment & Plan Note (Signed)
Summary: 1 YR FU PER DEGENT NEEDED   Visit Type:  Follow-up Primary Provider:  Criss Alvine   History of Present Illness: The patient presents today for routine electrophysiology followup. He reports doing very well recently. The patient denies symptoms of palpitations, chest pain, shortness of breath, orthopnea, PND, lower extremity edema, dizziness, presyncope, syncope, or neurologic sequela. The patient is tolerating medications without difficulties and is otherwise without complaint today.   Current Medications (verified): 1)  Amlodipine Besylate 10 Mg Tabs (Amlodipine Besylate) .... Take 1 Tablet By Mouth Once A Day 2)  Aspirin 81 Mg Tabs (Aspirin) .... Take One Tab By Mouth Every Other Day 3)  Benazepril Hcl 40 Mg Tabs (Benazepril Hcl) .... Take 1 Tablet By Mouth Once A Day 4)  Carvedilol 6.25 Mg Tabs (Carvedilol) .... Take 1 Tablet By Mouth Two Times A Day 5)  Warfarin Sodium 2 Mg Tabs (Warfarin Sodium) .... Use As Directed 6)  Furosemide 40 Mg Tabs (Furosemide) .... Take 1 1/2 Tablet By Mouth Two Times A Day 7)  Klor-Con M20 20 Meq Cr-Tabs (Potassium Chloride Crys Cr) .... Take 1 Tablet By Mouth Two Times A Day 8)  Oxybutynin Chloride 5 Mg Xr24h-Tab (Oxybutynin Chloride) .... Take 1 Tablet By Mouth Once A Day 9)  Pacerone 200 Mg Tabs (Amiodarone Hcl) .... Take 1 Tablet By Mouth Once A Day 10)  Pravastatin Sodium 40 Mg Tabs (Pravastatin Sodium) .... Take 1 Tablet By Mouth At Bedtime 11)  Multivitamins  Tabs (Multiple Vitamin) .... Take 1 Tablet By Mouth Once A Day 12)  Metamucil 30.9 % Powd (Psyllium) .... Daily 13)  Nitrostat 0.4 Mg Subl (Nitroglycerin) .... Use As Directed  Allergies (verified): No Known Drug Allergies  Past History:  Past Medical History: Reviewed history from 08/26/2009 and no changes required. 1. Symptomatic bradycardia status post dual-chamber pacemaker     implantation Medtronic device on January 10, 2008. 2. Recurrent left-sided atrial flutter.     a.      Amiodarone therapy.     b.     Coumadin therapy.     c.     Prior history of cardioversion and overdrive pacing.     d.     Maintenance on normal sinus rhythm. 3. Mixed ischemic and nonischemic cardiomyopathy with previous     ejection fraction of 35-40%, most recently in June 2009 normalized     to 55-60%. 4. Coronary artery disease status post coronary artery bypass grafting     x2 at [facility] in September 2008. 5. Valvular heart disease.     a.     Status post aortic valve replacement to bioprosthetic valve      at [facility] in September 2008.     b.     Status post mitral valve repair secondary to mitral      regurgitation in December 2008 also at [facility].     c.     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 6. History of atrial fibrillation status post Cox maze procedure. 7. Chronic Coumadin therapy. 8. Less than 50% bilateral internal carotid artery stenosis by     Dopplers 2009. 9. Hypertension, controlled. 10.Benign prostatic hypertrophy. 11.Left hip and thigh fusion at age 78. 12.Status post left total hip replacement at age 8. 13.Carpal tunnel syndrome.CAROTID ARTERY STENOSIS, WITHOUT INFARCTION (ICD-433.10) HYPERTENSION, UNSPECIFIED (ICD-401.9) ATRIAL FIBRILLATION (ICD-427.31) CAD, ARTERY BYPASS GRAFT (ICD-414.04) CARDIOMYOPATHY, ISCHEMIC (ICD-414.8) ATRIAL FLUTTER (ICD-427.32) BRADYCARDIA (ICD-427.89) PACEMAKER (ICD-V45.Marland Kitchen01) Benign prostatic hypertrophy.  Carpal tunnel syndrome.  Past Surgical History: Reviewed history from 04/10/2009 and no changes required. CABG Hip Arthroplasty-Total Knee Arthroscopy Carpal tunnel pacemaker Valve Replacement-Aortic (S/P)  Social History: Reviewed history from 04/10/2009 and no changes required. Married  Tobacco Use - No.  Alcohol Use - no  Vital Signs:  Patient profile:   77 year old male Height:      67 inches Weight:      183 pounds BMI:     28.77 Pulse rate:   70 / minute BP sitting:   103 /  63  (right arm)  Vitals Entered By: Laurance Flatten CMA (October 11, 2009 10:10 AM)  Physical Exam  General:  Well developed, well nourished, in no acute distress. Head:  normocephalic and atraumatic Mouth:  Teeth, gums and palate normal. Oral mucosa normal. Neck:  Neck supple, no JVD. No masses, thyromegaly or abnormal cervical nodes. Chest Wall:  pacemaker is well healed Lungs:  Clear bilaterally to auscultation and percussion. Heart:  RRR,  no m/r/g Abdomen:  Bowel sounds positive; abdomen soft and non-tender without masses, organomegaly, or hernias noted. No hepatosplenomegaly. Msk:  walks with a cane Extremities:  trace edema Neurologic:  Alert and oriented x 3. Skin:  Intact without lesions or rashes. Psych:  Normal affect.   PPM Specifications Following MD:  Hillis Range, MD     PPM Vendor:  Medtronic     PPM Model Number:  [id number]     PPM Serial Number:  [id number] PPM DOI:  01/10/2008     PPM Implanting MD:  Sherryl Manges, MD  Lead 1    Location: RA     DOI: 01/10/2008     Model #: [id number]     Serial #: [id number]     Status: active Lead 2    Location: RV     DOI: 01/10/2008     Model #: [id number]     Serial #: [id number]     Status: active   Indications:  SSS, AFIB   PPM Follow Up Remote Check?  No Battery Voltage:  2.99 V     Pacer Dependent:  No       PPM Device Measurements Atrium  Amplitude: PACED AT 30 mV, Impedance: 504 ohms, Threshold: 0.5 V at 0.4 msec Right Ventricle  Amplitude: 16.6 mV, Impedance: 448 ohms, Threshold: 1.0 V at 0.4 msec  Episodes MS Episodes:  1401     Percent Mode Switch:  6.3%     Coumadin:  Yes Ventricular High Rate:  0     Atrial Pacing:  97.5%     Ventricular Pacing:  97.5%  Parameters Mode:  DDDR     Lower Rate Limit:  70     Upper Rate Limit:  120 Paced AV Delay:  320     Sensed AV Delay:  300 Next Cardiology Appt Due:  03/27/2010 Tech Comments:  Normal device function. RA decreased to 2V today.  No other changes made.  Pt prefers office  visits to [facility].  ROV 6 months [facility]. Gypsy Balsam RN BSN  October 11, 2009 10:34 AM  MD Comments:  agree  Impression & Recommendations:  Problem # 1:  ATRIAL FLUTTER (ICD-427.32) The patient has a h/o atypical atrial flutter.  He appears to be maintaining sinus the majority of the time with amiodarone.  We will check TFTs and LFTs today.  Continue coumadin. No changes  Problem # 2:  BRADYCARDIA (ICD-427.89)  Normal pacemaker function no changes  His updated medication list for  this problem includes:    Amlodipine Besylate 10 Mg Tabs (Amlodipine besylate) .Marland Kitchen... Take 1 tablet by mouth once a day    Aspirin 81 Mg Tabs (Aspirin) .Marland Kitchen... Take one tab by mouth every other day    Benazepril Hcl 40 Mg Tabs (Benazepril hcl) .Marland Kitchen... Take 1 tablet by mouth once a day    Carvedilol 6.25 Mg Tabs (Carvedilol) .Marland Kitchen... Take 1 tablet by mouth two times a day    Warfarin Sodium 2 Mg Tabs (Warfarin sodium) ..... Use as directed    Pacerone 200 Mg Tabs (Amiodarone hcl) .Marland Kitchen... Take 1 tablet by mouth once a day    Nitrostat 0.4 Mg Subl (Nitroglycerin) ..... Use as directed  Problem # 3:  COUMADIN THERAPY (ICD-V58.61) goal INR 2-3  Other Orders: TLB-BMP (Basic Metabolic Panel-BMET) (80048-METABOL) TLB-T4 (Thyrox), Free [phone]) TLB-TSH (Thyroid Stimulating Hormone) (84443-TSH) TLB-Hepatic/Liver Function Pnl (80076-HEPATIC)  Patient Instructions: 1)  Your physician recommends that you schedule a follow-up appointment in: 6 months in the [facility] clinic 2)  Your physician recommends that you return for lab work today

## 2010-09-11 ENCOUNTER — Encounter (INDEPENDENT_AMBULATORY_CARE_PROVIDER_SITE_OTHER): Payer: Medicare Other | Admitting: Internal Medicine

## 2010-09-11 ENCOUNTER — Encounter: Payer: Self-pay | Admitting: Internal Medicine

## 2010-09-11 DIAGNOSIS — I495 Sick sinus syndrome: Secondary | ICD-10-CM

## 2010-09-11 DIAGNOSIS — I4891 Unspecified atrial fibrillation: Secondary | ICD-10-CM

## 2010-09-11 DIAGNOSIS — I5022 Chronic systolic (congestive) heart failure: Secondary | ICD-10-CM

## 2010-09-17 NOTE — Assessment & Plan Note (Signed)
Summary: [facility] pacer check   Visit Type:  Pacemaker check Primary Provider:  Criss Alvine   History of Present Illness: The patient is a 77 year old male with a history of left-sided atrial flutter, status post aortic valve replacement and mitral valve repair as well as Cox Maze procedure. The patient status post pacemaker implantation and is on amiodarone 100mg  daily. He has been doing well from a cardiac standpoint.  His primary limitation is due to hip and knee pain.  He walks with  a cane.  He reports mild BLE edema.  He denies any chest pain, short of breath, orthopnea or PND. Last echocardiogram in February 2011 demonstrates an ejection fraction of 55-60%. He denies any palpitations or syncope.    Preventive Screening-Counseling & Management  Alcohol-Tobacco     Smoking Status: quit     Year Quit: 1956  Current Medications (verified): 1)  Amlodipine Besylate 10 Mg Tabs (Amlodipine Besylate) .... Take 1 Tablet By Mouth Once A Day 2)  Aspirin 81 Mg Tabs (Aspirin) .... Take One Tab By Mouth Every Other Day 3)  Benazepril Hcl 40 Mg Tabs (Benazepril Hcl) .... Take 1 Tablet By Mouth Once A Day 4)  Carvedilol 6.25 Mg Tabs (Carvedilol) .... Take 1 Tablet By Mouth Two Times A Day 5)  Warfarin Sodium 2 Mg Tabs (Warfarin Sodium) .... Use As Directed 6)  Furosemide 40 Mg Tabs (Furosemide) .... Take 1 1/2 Tablet By Mouth Two Times A Day 7)  Klor-Con M20 20 Meq Cr-Tabs (Potassium Chloride Crys Cr) .... Take 1 Tablet By Mouth Two Times A Day 8)  Oxybutynin Chloride 5 Mg Xr24h-Tab (Oxybutynin Chloride) .... Take 1 Tablet By Mouth Once A Day 9)  Pacerone 200 Mg Tabs (Amiodarone Hcl) .... Take 1/2 Tab (100mg ) Daily 10)  Pravastatin Sodium 40 Mg Tabs (Pravastatin Sodium) .... Take 1 Tablet By Mouth At Bedtime 11)  Multivitamins  Tabs (Multiple Vitamin) .... Take 1 Tablet By Mouth Once A Day 12)  Metamucil 30.9 % Powd (Psyllium) .... Daily 13)  Nitrostat 0.4 Mg Subl (Nitroglycerin) .... Use As  Directed 14)  Staph Vaccination/placebo .... Use As Directed Per [facility]  Allergies (verified): No Known Drug Allergies  Comments:  Nurse/Medical Assistant: The patient's medications and allergies were reviewed with the patient and were updated in the Medication and Allergy Lists. Tammi Romine CMA (September 11, 2010 3:25 PM)  Past History:  Past Medical History: Reviewed history from 04/18/2010 and no changes required. 1. Symptomatic bradycardia status post dual-chamber pacemaker     implantation Medtronic device on January 10, 2008. 2. Recurrent left-sided atrial flutter.     a.     Amiodarone therapy.     b.     Coumadin therapy.     c.     Prior history of cardioversion and overdrive pacing.     d.     Maintenance on normal sinus rhythm. 3. Mixed ischemic and nonischemic cardiomyopathy with previous     ejection fraction of 35-40%, most recently in June 2009 normalized     to 55-60%.echocardiogram February 2011 ejection fraction 55-60%, status post mitral valve repair with mild MS.  Status post aVR with no aortic insufficiency. 4. Coronary artery disease status post coronary artery bypass grafting     x2 at [facility] in September 2008. 5. Valvular heart disease.     a.     Status post aortic valve replacement to bioprosthetic valve      at [facility] in  September 2008.     b.     Status post mitral valve repair secondary to mitral      regurgitation in December 2008 also at [facility].     c.     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 6. History of atrial fibrillation status post Cox maze procedure. 7. Chronic Coumadin therapy. 8. Less than 50% bilateral internal carotid artery stenosis by     Dopplers 2009. 9. Hypertension, controlled. 10.Benign prostatic hypertrophy. 11.Left hip and thigh fusion at age 22. 12.Status post left total hip replacement at age 44. 13.Carpal tunnel syndrome.CAROTID ARTERY STENOSIS, WITHOUT INFARCTION  (ICD-433.10) HYPERTENSION, UNSPECIFIED (ICD-401.9) ATRIAL FIBRILLATION (ICD-427.31) CAD, ARTERY BYPASS GRAFT (ICD-414.04) CARDIOMYOPATHY, ISCHEMIC (ICD-414.8) ATRIAL FLUTTER (ICD-427.32) BRADYCARDIA (ICD-427.89) PACEMAKER (ICD-V45.Marland Kitchen01) Benign prostatic hypertrophy.  Carpal tunnel syndrome.   Past Surgical History: Reviewed history from 04/10/2009 and no changes required. CABG Hip Arthroplasty-Total Knee Arthroscopy Carpal tunnel pacemaker Valve Replacement-Aortic (S/P)  Social History: Reviewed history from 04/10/2009 and no changes required. Married  Tobacco Use - No.  Alcohol Use - no  Review of Systems       All systems are reviewed and negative except as listed in the HPI.   Vital Signs:  Patient profile:   77 year old male Height:      67 inches Weight:      189 pounds BMI:     29.71 O2 Sat:      93 % on Room air Pulse rate:   72 / minute BP sitting:   118 / 75  (left arm) Cuff size:   regular  Vitals Entered By: Fuller Plan CMA (September 11, 2010 3:25 PM)  O2 Flow:  Room air  Physical Exam  General:  elderly, NAD Head:  normocephalic and atraumatic Eyes:  PERRLA/EOM intact; conjunctiva and lids normal. Mouth:  Teeth, gums and palate normal. Oral mucosa normal. Neck:  supple Chest Wall:  pacemaker is well healed Lungs:  Clear bilaterally to auscultation and percussion. Heart:  RRR (paced),  no m/r/g Abdomen:  Bowel sounds positive; abdomen soft and non-tender without masses, organomegaly, or hernias noted. No hepatosplenomegaly. Msk:  walks with a cane Extremities:  1+ edema Neurologic:  Alert and oriented x 3. Skin:  Intact without lesions or rashes. Psych:  Normal affect.   PPM Specifications Following MD:  Hillis Range, MD     PPM Vendor:  Medtronic     PPM Model Number:  [id number]     PPM Serial Number:  [id number] PPM DOI:  01/10/2008     PPM Implanting MD:  Sherryl Manges, MD  Lead 1    Location: RA     DOI: 01/10/2008     Model #: [id number]      Serial #: [id number]     Status: active Lead 2    Location: RV     DOI: 01/10/2008     Model #: [id number]     Serial #: [id number]     Status: active   Indications:  SSS, AFIB   PPM Follow Up Pacer Dependent:  No      Episodes Coumadin:  Yes  Parameters Mode:  DDDR     Lower Rate Limit:  70     Upper Rate Limit:  120 Paced AV Delay:  320     Sensed AV Delay:  300 MD Comments:  see scanned report  Impression & Recommendations:  Problem # 1:  CARDIOMYOPATHY, ISCHEMIC (ICD-414.8) He has stable chronic systolic dysfunction with mild volume  overload.  No symptoms of ischemia Salt restriction advised no medicine changes today  Problem # 2:  ATRIAL FIBRILLATION (ICD-427.31) he has both afib and atypical flutter,  minimally symptomatic at this time continue amiodarone 100mg  daily pt reports that PCP is following TFTs and LFTs,  he will ask Dr Criss Alvine to forward results to our office  Problem # 3:  COUMADIN THERAPY (ICD-V58.61) goal INR 2-3 not a candidate for pradaxa or xarelto due to valvular heart disease  Problem # 4:  HYPERTENSION, UNSPECIFIED (ICD-401.9) stable  Problem # 5:  BRADYCARDIA (ICD-427.89) normal pacemaker function see scanned report  Patient Instructions: 1)  return to device clinic in 6 months 2)  follow-up with Dr Andee Lineman as scheduled 3)  Amiodarone [facility] labs with Dr Criss Alvine every 6 months

## 2010-09-22 ENCOUNTER — Encounter: Payer: Self-pay | Admitting: Cardiology

## 2010-10-02 NOTE — Cardiovascular Report (Signed)
Summary: [facility]/  [facility]  [facility]   Imported By: Dorise Hiss 09/24/2010 16:49:25  _____________________________________________________________________  External Attachment:    Type:   Image     Comment:   External Document

## 2010-10-10 ENCOUNTER — Encounter: Payer: Self-pay | Admitting: Cardiology

## 2010-10-10 ENCOUNTER — Ambulatory Visit (INDEPENDENT_AMBULATORY_CARE_PROVIDER_SITE_OTHER): Payer: Medicare Other | Admitting: Cardiology

## 2010-10-10 DIAGNOSIS — I4891 Unspecified atrial fibrillation: Secondary | ICD-10-CM

## 2010-10-10 DIAGNOSIS — I059 Rheumatic mitral valve disease, unspecified: Secondary | ICD-10-CM | POA: Insufficient documentation

## 2010-10-10 DIAGNOSIS — I359 Nonrheumatic aortic valve disorder, unspecified: Secondary | ICD-10-CM

## 2010-10-10 DIAGNOSIS — E785 Hyperlipidemia, unspecified: Secondary | ICD-10-CM | POA: Insufficient documentation

## 2010-10-21 ENCOUNTER — Encounter: Payer: Self-pay | Admitting: Cardiology

## 2010-10-23 NOTE — Assessment & Plan Note (Signed)
Summary: FOLLOW UP - 6 MONTHS   Visit Type:  Follow-up Primary Provider:  Criss Alvine   History of Present Illness: Patient 77 year old male with history of left-sided atrial flutter, status post aortic valve replacement and mitral valve repair as well as a Cox maze procedure.  The patient is status post pacemaker but patient is on amiodarone hundred milligrams daily.  His most recent ejection fraction is 55 to 60% in February of 2011.  He remains on Coumadin. Thyroid function studies are followed by Dr. Criss Alvine.  Her last TSH however was from December of 2011. The patient does report some dizziness upon standing.  He also had some lower extremity edema and his wife increased his Lasix to 80 mg twice a day.  He reported a single episode of double vision which lasted 30 to 35 seconds. He has had no cardiac complications and reports no orthopnea PND palpitations or syncope. 12-lead electrocardiogram in the office shows AV sequential paced rhythm. A limited portable ultrasound of the heart was done there was normal LV and RV function.  There was mild to moderate tricuspid regurgitation but no significant mitral regurgitation the aortic prosthesis was well seated with no significant regurgitation.   Preventive Screening-Counseling & Management  Alcohol-Tobacco     Smoking Status: quit     Year Quit: 1956  Current Medications (verified): 1)  Amlodipine Besylate 10 Mg Tabs (Amlodipine Besylate) .... Take 1 Tablet By Mouth Once A Day 2)  Aspirin 81 Mg Tabs (Aspirin) .... Take One Tab By Mouth Every Other Day 3)  Benazepril Hcl 40 Mg Tabs (Benazepril Hcl) .... Take 1 Tablet By Mouth Once A Day 4)  Carvedilol 6.25 Mg Tabs (Carvedilol) .... Take 1 Tablet By Mouth Two Times A Day 5)  Warfarin Sodium 2 Mg Tabs (Warfarin Sodium) .... Use As Directed 6)  Furosemide 40 Mg Tabs (Furosemide) .... Take 2 Tablet By Mouth Two Times A Day 7)  Klor-Con M20 20 Meq Cr-Tabs (Potassium Chloride Crys Cr) .... Take 1  Tablet By Mouth Two Times A Day 8)  Oxybutynin Chloride 5 Mg Xr24h-Tab (Oxybutynin Chloride) .... Take 1 Tablet By Mouth Once A Day 9)  Pacerone 200 Mg Tabs (Amiodarone Hcl) .... Take 1/2 Tab (100mg ) Daily 10)  Pravastatin Sodium 40 Mg Tabs (Pravastatin Sodium) .... Take 1 Tablet By Mouth At Bedtime 11)  Multivitamins  Tabs (Multiple Vitamin) .... Take 1 Tablet By Mouth Once A Day 12)  Metamucil 30.9 % Powd (Psyllium) .... Daily 13)  Nitrostat 0.4 Mg Subl (Nitroglycerin) .... Use As Directed  Allergies (verified): No Known Drug Allergies  Comments:  Nurse/Medical Assistant: The patient's medication list and allergies were reviewed with the patient and were updated in the Medication and Allergy Lists.  Past History:  Past Medical History: Last updated: 04/18/2010 1. Symptomatic bradycardia status post dual-chamber pacemaker     implantation Medtronic device on January 10, 2008. 2. Recurrent left-sided atrial flutter.     a.     Amiodarone therapy.     b.     Coumadin therapy.     c.     Prior history of cardioversion and overdrive pacing.     d.     Maintenance on normal sinus rhythm. 3. Mixed ischemic and nonischemic cardiomyopathy with previous     ejection fraction of 35-40%, most recently in June 2009 normalized     to 55-60%.echocardiogram February 2011 ejection fraction 55-60%, status post mitral valve repair with mild MS.  Status post aVR with  no aortic insufficiency. 4. Coronary artery disease status post coronary artery bypass grafting     x2 at [facility] in September 2008. 5. Valvular heart disease.     a.     Status post aortic valve replacement to bioprosthetic valve      at [facility] in September 2008.     b.     Status post mitral valve repair secondary to mitral      regurgitation in December 2008 also at [facility].     c.     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 6. History of atrial fibrillation status post Cox maze procedure. 7. Chronic Coumadin  therapy. 8. Less than 50% bilateral internal carotid artery stenosis by     Dopplers 2009. 9. Hypertension, controlled. 10.Benign prostatic hypertrophy. 11.Left hip and thigh fusion at age 49. 12.Status post left total hip replacement at age 75. 13.Carpal tunnel syndrome.CAROTID ARTERY STENOSIS, WITHOUT INFARCTION (ICD-433.10) HYPERTENSION, UNSPECIFIED (ICD-401.9) ATRIAL FIBRILLATION (ICD-427.31) CAD, ARTERY BYPASS GRAFT (ICD-414.04) CARDIOMYOPATHY, ISCHEMIC (ICD-414.8) ATRIAL FLUTTER (ICD-427.32) BRADYCARDIA (ICD-427.89) PACEMAKER (ICD-V45.Marland Kitchen01) Benign prostatic hypertrophy.  Carpal tunnel syndrome.   Past Surgical History: Last updated: 04/10/2009 CABG Hip Arthroplasty-Total Knee Arthroscopy Carpal tunnel pacemaker Valve Replacement-Aortic (S/P)  Family History: Last updated: 08/26/2009 Negative FH of Diabetes, Hypertension, or Coronary Artery Disease  Social History: Last updated: 04/10/2009 Married  Tobacco Use - No.  Alcohol Use - no  Risk Factors: Smoking Status: quit (10/10/2010)  Review of Systems       The patient complains of muscle weakness and joint pain.  The patient denies fatigue, malaise, fever, weight gain/loss, vision loss, decreased hearing, hoarseness, chest pain, palpitations, shortness of breath, prolonged cough, wheezing, sleep apnea, coughing up blood, abdominal pain, blood in stool, nausea, vomiting, diarrhea, heartburn, incontinence, blood in urine, leg swelling, rash, skin lesions, headache, fainting, dizziness, depression, anxiety, enlarged lymph nodes, easy bruising or bleeding, and environmental allergies.    Vital Signs:  Patient profile:   77 year old male Height:      67 inches Weight:      186 pounds O2 Sat:      94 % on Room air Pulse rate:   69 / minute Pulse (ortho):   78 / minute BP sitting:   96 / 63  (left arm) BP standing:   101 / 66 Cuff size:   large  Vitals Entered By: Carlye Grippe (October 10, 2010 2:39 PM)  O2  Flow:  Room air  Serial Vital Signs/Assessments:  Time      Position  BP       Pulse  Resp  Temp     By 3:18 PM   Lying LA  106/67   71                    Lydia Anderson 3:18 PM   Sitting   98/63    71                    Lydia Anderson 3:18 PM   Standing  101/66   78                    Lydia Anderson 3:22 PM   Standing  115/69   71                    Lydia Anderson 3:27 PM   Standing  117/71   69  Carlye Grippe   Physical Exam  Additional Exam:  General: Well-developed, well-nourished in no distress head: Normocephalic and atraumatic eyes PERRLA/EOMI intact, conjunctiva and lids normal nose: No deformity or lesions mouth normal dentition, normal posterior pharynx neck: Supple, no JVD.  No masses, thyromegaly or abnormal cervical nodes lungs: Normal breath sounds bilaterally without wheezing.  Normal percussion heart: regular rate and rhythm with normal S1 and S2, no S3 or S4.  PMI is normal.  No pathological murmurs abdomen: Normal bowel sounds, abdomen is soft and nontender without masses, organomegaly or hernias noted.  No hepatosplenomegaly musculoskeletal: Back normal, difficulty with gait due to chronic hip problems. pulsus: Pulse is normal in all 4 extremities Extremities: No peripheral pitting edema neurologic: Alert and oriented x 3 skin: Intact without lesions or rashes cervical nodes: No significant adenopathy psychologic: Normal affect    PPM Specifications Following MD:  Hillis Range, MD     PPM Vendor:  Medtronic     PPM Model Number:  [id number]     PPM Serial Number:  [id number] PPM DOI:  01/10/2008     PPM Implanting MD:  Sherryl Manges, MD  Lead 1    Location: RA     DOI: 01/10/2008     Model #: [id number]     Serial #: [id number]     Status: active Lead 2    Location: RV     DOI: 01/10/2008     Model #: [id number]     Serial #: [id number]     Status: active   Indications:  SSS, AFIB   PPM Follow Up Pacer Dependent:  No      Episodes Coumadin:   Yes  Parameters Mode:  DDDR     Lower Rate Limit:  70     Upper Rate Limit:  120 Paced AV Delay:  320     Sensed AV Delay:  300  Impression & Recommendations:  Problem # 1:  AORTIC VALVE DISORDERS (ICD-424.1)  status-post aortic valve replacement secondary to endocarditis His updated medication list for this problem includes:    Benazepril Hcl 40 Mg Tabs (Benazepril hcl) .Marland Kitchen... Take 1 tablet by mouth once a day    Carvedilol 6.25 Mg Tabs (Carvedilol) .Marland Kitchen... Take 1 tablet by mouth two times a day    Furosemide 40 Mg Tabs (Furosemide) .Marland Kitchen... Take 2 tablet by mouth two times a day    Nitrostat 0.4 Mg Subl (Nitroglycerin) ..... Use as directed  Orders: T-Basic Metabolic Panel [phone]) T-TSH [phone])  Problem # 2:  COUMADIN THERAPY (ICD-V58.61) left-sided atrial flutter status post Cox maze procedure and amiodarone therapy: TSH followed by his primary care physician Coumadin therapy  Problem # 3:  CAD, ARTERY BYPASS GRAFT (ICD-414.04) coronary disease status post coronary bypass grafting x 2 His updated medication list for this problem includes:    Amlodipine Besylate 10 Mg Tabs (Amlodipine besylate) .Marland Kitchen... Take 1 tablet by mouth once a day    Aspirin 81 Mg Tabs (Aspirin) .Marland Kitchen... Take one tab by mouth every other day    Benazepril Hcl 40 Mg Tabs (Benazepril hcl) .Marland Kitchen... Take 1 tablet by mouth once a day    Carvedilol 6.25 Mg Tabs (Carvedilol) .Marland Kitchen... Take 1 tablet by mouth two times a day    Warfarin Sodium 2 Mg Tabs (Warfarin sodium) ..... Use as directed    Nitrostat 0.4 Mg Subl (Nitroglycerin) ..... Use as directed  Problem # 4:  PACEMAKER, PERMANENT (ICD-V45.01) Assessment: Comment Only  Problem # 5:  CARDIOMYOPATHY,  ISCHEMIC (ICD-414.8) mixed ischemic and nonischemic cardiomyopathy normal as ejection fraction His updated medication list for this problem includes:    Amlodipine Besylate 10 Mg Tabs (Amlodipine besylate) .Marland Kitchen... Take 1 tablet by mouth once a day    Aspirin 81 Mg  Tabs (Aspirin) .Marland Kitchen... Take one tab by mouth every other day    Benazepril Hcl 40 Mg Tabs (Benazepril hcl) .Marland Kitchen... Take 1 tablet by mouth once a day    Carvedilol 6.25 Mg Tabs (Carvedilol) .Marland Kitchen... Take 1 tablet by mouth two times a day    Warfarin Sodium 2 Mg Tabs (Warfarin sodium) ..... Use as directed    Furosemide 40 Mg Tabs (Furosemide) .Marland Kitchen... Take 2 tablet by mouth two times a day    Pacerone 200 Mg Tabs (Amiodarone hcl) .Marland Kitchen... Take 1/2 tab (100mg ) daily    Nitrostat 0.4 Mg Subl (Nitroglycerin) ..... Use as directed  Other Orders: EKG w/ Interpretation (93000) T-Lipid Profile ([address]-60454) T-Hepatic Function (09811-91478)  Patient Instructions: 1)  Labs today:  BMET & TSH 2)  Follow up in  6 months Prescriptions: BENAZEPRIL HCL 40 MG TABS (BENAZEPRIL HCL) Take 1 tablet by mouth once a day  #30 Tablet x 6   Entered by:   Hoover Brunette, LPN   Authorized by:   Lewayne Bunting, MD, [facility]   Signed by:   Hoover Brunette, LPN on 29/56/2130   Method used:   Electronically to        [facility]  [address] [phone]* (retail)       [address]       [place], [place]  [address]       Ph: [phone]       Fax: [phone]   RxID:   [id number] PRAVASTATIN SODIUM 40 MG TABS (PRAVASTATIN SODIUM) Take 1 tablet by mouth at bedtime  #30 x 6   Entered by:   Hoover Brunette, LPN   Authorized by:   Lewayne Bunting, MD, [facility]   Signed by:   Hoover Brunette, LPN on 56/43/3295   Method used:   [facility]ron[facility]        [facility]  [address] [phone]* (retail)       [address]       [place], [place]  [address]       Ph: [phone]       Fax: [phone]   RxID:   [id number] CARVEDILOL 6.25 MG TABS (CARVEDILOL) Take 1 tablet by mouth two times a day  #60 x 6   Entered by:   Hoover Brunette, LPN   Authorized by:   Lewayne Bunting, MD, [facility]   Signed by:   Hoover Brunette, LPN on 61/60/7371   Method used:   [facility]ically [facility]        [facility]  [address] [phone]* (retail)       [address]       [place], [place]  [address]       Ph:  [phone]       Fax: [phone]   RxID:   [id number] FUROSEMIDE 40 MG TABS (FUROSEMIDE) Take 2 tablet by mouth two times a day  #120 x 6   Entered by:   Hoover Brunette, LPN   Authorized by:   Lewayne Bunting, MD, [facility]   Signed by:   Hoover Brunette, LPN on 85/27/7824   Method used:   [facility]        [facility]  [address] [phone]* (retail)       [address]  [place], [place]  [address]       Ph: [phone]       Fax: [phone]   RxID:   [id number]

## 2010-10-29 LAB — POCT I-STAT, CHEM 8
BUN: 19 mg/dL (ref 6–23)
Calcium, Ion: 1.17 mmol/L (ref 1.12–1.32)
Chloride: 102 mEq/L (ref 96–112)
Creatinine, Ser: 1.1 mg/dL (ref 0.4–1.5)
Glucose, Bld: 95 mg/dL (ref 70–99)
HCT: 45 % (ref 39.0–52.0)
Hemoglobin: 15.3 g/dL (ref 13.0–17.0)
Potassium: 3.6 mEq/L (ref 3.5–5.1)
Sodium: 141 mEq/L (ref 135–145)
TCO2: 30 mmol/L (ref 0–100)

## 2010-10-29 LAB — WOUND CULTURE: Culture: NO GROWTH

## 2010-10-29 LAB — ANAEROBIC CULTURE

## 2010-10-29 LAB — PROTIME-INR
INR: 1.41 (ref 0.00–1.49)
Prothrombin Time: 17.1 seconds — ABNORMAL HIGH (ref 11.6–15.2)

## 2010-10-29 LAB — AFB CULTURE WITH SMEAR (NOT AT ARMC): Acid Fast Smear: NONE SEEN

## 2010-10-29 LAB — APTT: aPTT: 32 seconds (ref 24–37)

## 2010-12-09 NOTE — Assessment & Plan Note (Signed)
[facility]                          [facility] OFFICE NOTE   NAME:WINNZhi, Geier                  MRN:          [id number]  DATE:08/11/2007                            DOB:          [birth year]    REFERRING PHYSICIAN:  Murlean Hark, III in [place].   PRESENT ILLNESS:  The patient is a 77 year old male with a history of  nonischemic cardiomyopathy and LV dysfunction secondary to aortic  insufficiency and mitral regurgitation.  Due to progressive symptoms,  the patient was referred, last year, for valve surgery.  The patient had  2-vessel coronary artery bypass grafting, a Cox-Mays procedure as well a  bioaortic valve replacement, and mitral valve repair.  Postoperatively  the patient was initially in atrial flutter.  This was confirmed in the  last office visit on May 17, 2007.  The patient; however, in the  interim, has reverted back to normal sinus rhythm (albeit sinus  bradycardia with variable P-wave amplitude).  The patient has done  remarkably well postoperatively, and he states that his lifestyle has  much improved.  He has increased level of activity.  His shortness of  breath has markedly improved.  He denies any substernal chest pain,  palpitations, or syncope.   Of note is that his preoperative course was complicated by a bout of  endocarditis which was treated successfully after the patient received  antibiotics for a prolonged period of time.  He also reports to me that  somewhat unrelated, he has developed significant improvement in his left  hip pain.  The patient and his wife are very pleased with the care, and  they both feel that he has made a dramatic improvement in his functional  capacity.   MEDICATIONS:  1. Benazepril 40 mg p.o. daily.  2. Lasix 40 mg p.o. daily.  3. Potassium 20 mg p.o. daily.  4. Norvasc 10 mg p.o. daily.  5. Metamucil p.r.n.  6. Coumadin as directed.  7. Multivitamin.  8.  Aspirin 81 mg p.o. daily.  9. Oxybutynin 5 mg p.o. daily.  10.Nitroglycerin p.r.n.   PHYSICAL EXAMINATION:  VITAL SIGNS:  Blood pressure is 110/57, heart  rate 55, weight 163 pounds.  NECK EXAM:  Normal carotid upstroke and no carotid bruits.  LUNGS:  Clear breath sounds bilaterally with no crackles or wheezing.  HEART:  Regular rate and rhythm, bradycardic with a normal S1-S2.  The  patient does have a 1-2/6 systolic murmur at the left upper sternal  border as well as somewhat of a harsh murmur at the apex, which is  approximately 2/6.  ABDOMEN:  Soft, nontender, no rebound or guarding.  Good bowel sounds.  EXTREMITY EXAM:  No cyanosis, clubbing or edema.   A 12-lead electrocardiogram sinus bradycardia with nonspecific ST-T wave  changes.   PROBLEM LIST:  1. Nonischemic/ischemic cardiomyopathy.      a.     Current ejection fraction 40%.      b.     NYH class II.  2. Status post aortic valve replacement by prosthesis (secondary to      mild-to-severe aortic insufficiency).  3. Status  post mitral valve repair secondary to mitral regurgitation.  4. Status post 2-vessel coronary artery bypass grafting as outlined      above.  5. Status post Cox-Mays procedure with postoperative atrial flutter,      now in sinus bradycardia.  6. Hypertension, controlled.  7. History of endocarditis, prevalve surgery.  8. Coumadin anticoagulation (secondary to postoperative atrial      flutter).  9. Small left pleural effusion, resolved.  10.Abnormal cardiac exam with systolic murmurs at the base and the      apex of the heart (see details above.)   PLAN:  1. The patient has somewhat of a harsh systolic murmur at the apex of      the heart, however, an echocardiographic study was done and shows      durability of his mitral valve repair with only mild mitral      regurgitation.  Also, the bioprosthetic aortic valve was well      seated, and demonstrated normal gradients.  The patient will get a       follow up echocardiographic study in October 2009, and continue to      monitor the durability of his mitral valve repair and aortic valve      replacement.  I documented, carefully, the somewhat unusual cardiac      murmurs; but, given the patient's very good functional status and      recent echocardiogram study, I do not think a repeat      echocardiographic study is indicated at the present time.  2. In regards to the patient's anticoagulation.  He should be      discontinued, hopefully, in the next couple of months.  The patient      reports to me that he has a followup appointment at [facility] where they plan to do one more Holter monitor, I      assume, to confirm the continued maintenance of a normal sinus      rhythm.  The plan is then. to stop his Coumadin.  I told the      patient's wife to further discuss this with [facility], and and if they      have not heard from [facility] to call me and we will make a decision to      discontinue his Coumadin at the appropriate time.  3. The patient's blood pressure is controlled, and he appears to be      under good medical regimen.  The patient does report rare episodes      of epistaxis; and, again, I told him that hopefully this will      resolve once Coumadin has been discontinued hopefully within the      next 3 months.  4. The patient will continue to follow up with me, and we will see him      back for a follow up appointment in October 2009 at the time of his      echocardiographic study.    [name], MD,FACC  Electronically Signed   GED/MedQ  DD: 08/12/2007  DT: 08/12/2007  Job #: [id number]   cc:   [facility]  [place] Murlean Hark, III

## 2010-12-09 NOTE — Op Note (Signed)
NAME:  [name], [name] NO.:  [id number]   MEDICAL RECORD NO.:  [id number]          PATIENT TYPE:  INP   LOCATION:  2034                         FACILITY:  [facility]   PHYSICIAN:  [facility] Salvia, MD, FACCDATE OF BIRTH:  September 29, 1928   DATE OF PROCEDURE:  01/11/2008  DATE OF DISCHARGE:  01/11/2008                               OPERATIVE REPORT   Mr. Spainhour was admitted to anesthesia under the care of Dr. Gelene Mink.  He  received 175 mg of sodium Pentothal.  A single 150-joule shock was  delivered synchronizely via biphasic defibrillator restoring sinus  rhythm .  The patient tolerated the procedure without apparent  complication.      [facility] Salvia, MD, [facility]  Electronically Signed     SCK/MEDQ  D:  01/11/2008  T:  01/12/2008  Job:  [id number]

## 2010-12-09 NOTE — Assessment & Plan Note (Signed)
[facility]                          [facility]   NAME:[name], [name]                  MRN:          [id number]  DATE:02/02/2008                            DOB:          13-Mar-1929    CARDIOLOGIST:  [name], MD, [facility]   PRIMARY CARE PHYSICIAN:  Lajoyce Lauber, MD   REASON FOR VISIT:  Posthospitalization followup.   HISTORY OF PRESENT ILLNESS:  [name] is a 77 year old male patient with  a history of mixed ischemic and nonischemic cardiomyopathy with a  previous EF of 35-40%, CAD and atrial fibrillation.  He is status post  aortic valve replacement and mitral valve repair secondary to mitral  regurgitation.  He developed bacterial endocarditis sometime prior to  his surgery.  He underwent two-vessel CABG and Cox-Maze procedure at the  time of his valve surgery at [facility] in September  2008.  The patient developed postoperative atrial flutter.  He has been  maintained on Coumadin therapy as well as amiodarone therapy, and when  he was last seen by Dr. Andee Lineman in April 2009, the plan was to hopefully  get him off Coumadin and amiodarone at some point in the extended  future.  He presented back to [facility] in mid June secondary to  syncope.  He was noted to have significant pauses of 2-3 seconds.  He  was also noted to be back in atrial flutter.  He was transferred to  [facility] for possible radiofrequency catheter ablation  therapy as well as permanent pacemaker implantation.   The patient did undergo dual-chamber pacemaker implantation by Dr. Graciela Husbands  with a Medtronic device.  The patient was not a candidate for ablation  and antitachycardia pacing was attempted in the EP Lab, but this was  unsuccessful.  He was cardioverted to normal sinus rhythm on the day of  discharge.  He has seen one of our EP nurses back in the EP Clinic for  wound check.  He does have a small hematoma noted  over his device.  He  was apparently noted to still be in atrial flutter when he was seen in  clinic sometime last week.   The patient notes today that he is doing well.  He denies chest pain.  There is swelling over his pacemaker site that seems to be resolving.  He denies pain or discharge.  He denies any fevers.  He denies any  significant changes in his shortness of breath.  He denies orthopnea or  PND.  He denies any syncope.  He does note some lightheadedness from  time to time.  Of note, he is to undergo carpal tunnel release on the  right with Dr. Molly Maduro Cypher on February 28, 2008.  We have received word  from Dr. Teressa Senter that the patient needs to come off his Coumadin for the  procedure.  His recent INR per Dr. Criss Alvine was 2.1 on February 01, 2008.   It should be noted that during the patient's admission recently at  [facility], he did have a followup echocardiogram that revealed  recovery of his  LV function.  His EF was 55-60%.  The bioprosthetic  aortic valve appeared be functioning normally.  He had mild mitral  regurgitation status post mitral valve repair.   CURRENT MEDICATIONS:  1. Benazepril 40 mg daily.  2. Lasix 40 mg daily.  3. Potassium 20 mEq daily.  4. Metamucil.  5. Warfarin as directed.  6. Amiodarone 100 mg daily.  7. Study drug from [place].  8. Multivitamin.  9. Pravastatin 40 mg daily.  10.Coreg 3.125 mg b.i.d.  11.Aspirin 81 mg daily.  12.Oxybutynin 5 mg daily.  13.Nitroglycerin p.r.n.  14.Potassium p.r.n.   ALLERGIES:  No known drug allergies.   PHYSICAL EXAMINATION:  GENERAL:  He is a well nourished, well developed,  in no distress.  VITAL SIGNS:  Blood pressure is 112/66, pulse 76, and weight 161.2  pounds.  HEENT:  Normal.  NECK:  Without JVD.  CARDIAC:  Normal S1 and S2.  Regular rate and rhythm.  A 2/6 systolic  ejection murmur heard best along the left sternal border that is harsh.  LUNGS:  Clear to auscultation bilaterally.  ABDOMEN:   Soft and nontender.  EXTREMITIES:  Without Edema.  NEUROLOGIC:  Alert and oriented x3.  Cranial nerves II-XII are grossly  intact.  SKIN:  Pacemaker site on the left reveals no erythema or discharge.  He  does have a soft hematoma noted over the pacemaker.  This is nontender.  As noted, the patient feels it is decreasing in size.   Electrocardiogram reveals AV pacing with a ventricular rate of 74.   IMPRESSION:  1. Symptomatic bradycardia status post dual-chamber pacemaker      implantation (Medtronic device) on January 10, 2008.  2. Recurrent atrial flutter.      a.     Amiodarone therapy.      b.     Coumadin therapy.      c.     Status post direct current cardioversion on January 11, 2008.      d.     Maintaining normal sinus rhythm.  3. Mixed ischemic and nonischemic cardiomyopathy with a previous      ejection fraction of 35-40%.      a.     Improved ejection fraction to 55-60% by recent       echocardiogram.  4. Coronary artery disease, status post coronary artery bypass graft      x2 at [facility] in September 2008.  5. Valvular heart disease.      a.     Status post aortic valve replacement with a bioprosthetic       valve at [facility] in September 2008.      b.     Status post mitral valve repair secondary to mitral       regurgitation in September 2008 at [facility].      c.     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  6. History of atrial fibrillation, status post Cox-Maze procedure at      the time of his bypass and valve surgery at [facility].  7. Chronic Coumadin therapy.      a.     Less than 50% bilateral internal carotid artery stenosis by       carotid Dopplers on January 09, 2008.  8. Hypertension.  9. Benign prostatic hypertrophy.  10.History of left hip and thigh fusion at age 25.  11.Status post left total hip replacement at age 56.  12.Carpal tunnel syndrome.  PLAN:  1. [name] returns for posthospitalization followup.  He  does have a      small hematoma noted over his pacer site.  This is soft, and the      patient notes that it is decreasing in size.  There is no pain,      erythema, or discharge.  There have been no reports of fevers.  2. The patient is maintaining sinus rhythm with AV pacing noted on his      electrocardiogram.  According to the report, he apparently had      recurrent atrial flutter when he had his wound checked last week in      our EP Clinic in [place].  It appears he may be having      paroxysmal atrial flutter.  Given the fact that he could not      undergo radiofrequency catheter ablation and he has significant      thromboembolic risk factors with a CHADS2 score of 3, he may indeed      need long-term Coumadin therapy.  We will continue to review this      over time with Dr. Andee Lineman who is his primary cardiologist.  3. The patient needs carpal tunnel surgery with Dr. Teressa Senter in early      August 2009.  This will be greater than 4 weeks post cardioversion.      He does not have any history of stroke, and he does not have a      mechanical valve.  It would be acceptable for him to come off      Coumadin preoperatively without any bridging therapy.  He has been      instructed to hold his Coumadin a total of 4 days prior to his      procedure.  I had suggested that he get his INR checked with Dr.      Criss Alvine the day before he starts to hold his Coumadin.  Dr. Teressa Senter      wants his INR at 1.4      for the surgery.  4. The patient will follow up with Dr. Andee Lineman in the next 4-6 weeks or      sooner p.r.n.      Tereso Newcomer, PA-C  Electronically Signed      Luis Abed, MD, [facility]  Electronically Signed   SW/MedQ  DD: 02/02/2008  DT: 02/03/2008  Job #: [id number]   cc:   Lajoyce Lauber, MD  Katy Fitch. Sypher, M.D.

## 2010-12-09 NOTE — Assessment & Plan Note (Signed)
[facility]                          [facility]   NAME:[name], [name]                  MRN:          [id number]  DATE:05/17/2007                            DOB:          1929-06-20    HISTORY OF PRESENT ILLNESS:  Patient is a 77 year old male with a  history of previous chronic atrial fibrillation and predominantly non-  ischemic cardiomyopathy with a known ejection fraction of 35%.  The  patient has a history of valvular heart disease, aortic insufficiency,  and mitral regurgitation.  Due to progressive symptoms of congestive  heart failure and dyspnea, the patient was referred to [facility] for possible valve surgery.  The patient underwent  cardiac catheterization, and it was felt he could benefit from aortic  replacement and mitral valve repair.  Unfortunately, prior to coronary  artery bypass grafting, the patient developed endocarditis, and was  treated with a prolonged course of antibiotics.  Ultimately, the patient  underwent surgery.  Although I do not have the details of the operative  report, I do have a discharge summary from [facility], which reports that the patient had 2-vessel coronary artery  bypass grafting, a Cox maze procedure, as well as a bioprosthetic aortic  valve replacement and mitral valve repair.  Postoperatively, the patient  remained in atrial flutter, and according to the patient's wife, there  are possible future plans for cardioversion.  The patient, however, is  currently not on antiarrhythmic drug therapy.  The patient also  currently has remained on Coumadin therapy.  Overall, the patient had a  remarkably good recovery post surgery.  He stated he feels stronger.  His appetite has increased.  His exercise tolerance has also improved.  He does have to walk with a 4-wheel walker, but he is able to walk more  than 1000 feet every day.  His wife, today, is  concerned particularly  about the patient's blood pressure, which has been recorded on several  occasions at home to be well over 200 mm of mercury systolic.  Today in  the office, the patient's blood pressure was 152/90.   At rest, he has no shortness of breath.  The patient denies any  substernal chest pain, orthopnea, or PND.  He also appears to have no  complications related to his chest sternotomy.   MEDICATIONS:  1. Coumadin as directed.  2. Pravastatin 40 mg p.o. daily.  3. Coreg 3.125 mg p.o. b.i.d.  4. Lasix 40 mg p.o. q.8 hours.  5. Benazepril 20 mg p.o. daily.  6. Aspirin 81 mg p.o. daily.  7. Oxybutynin 5 mg p.o. daily.  8. Potassium 20 mEq every 12 hours while on Lasix.  9. Magnesium q.12 hours x30 days at 400 mg.  10.Metamucil 1 tablespoon p.o. daily.   PHYSICAL EXAMINATION:  VITAL SIGNS:  Blood pressure 149/89 in the left  arm, 152/90 in the right arm.  Heart rate 79 beats per minute.  Weight  is 162 pounds.  NECK EXAM:  Normal carotid upstroke.  No carotid bruits.  JVD is  approximately 7 cm.  LUNGS:  Clear breath sounds bilaterally with diminished breath sounds in  the left base.  HEART:  Regular rate and rhythm with normal S1 and S2 with no  pathological murmurs at present.  PMI is non-displaced.  ABDOMEN:  Soft and nontender.  EXTREMITY EXAM:  No cyanosis, clubbing, or edema.  NEUROLOGIC:  Patient is alert, oriented, and grossly nonfocal.   PROBLEM LIST:  1. Mixed nonischemic/ischemic cardiomyopathy.  Previous ejection      fraction 35%.  2. New [place] Heart Association Class II.  3. Status post aortic replacement and bioprosthesis secondary to      moderately severe aortic insufficiency.  4. History of mitral regurgitation, status post mitral valve repair.  5. Status post 2-vessel coronary artery bypass graft as outlined      above.  6. Postoperative atrial flutter.  7. Hypertension.  Poorly controlled.  8. History of endocarditis pre-valve surgery.  9.  Coumadin anticoagulation.  10.Small left pleural effusion.   PLAN:  1. The patient has done remarkably well.  Symptomatically, he appears      to have improved.  2. Major concern currently is the patient's uncontrolled hypertension.      I have increased the patient's benazepril back from 20 mg to 40 mg      p.o. daily, and also added Norvasc 5 mg p.o. daily.  I told the      patient and his wife that we will further increase Norvasc to 10 mg      p.o. daily if indicated, and if the patient remains hypertensive,      Coreg can be further increased also, particularly now in light of      the fact the patient's aortic insufficiency has been corrected.  3. In regards to the anticoagulation, the patient should remain on      Coumadin, as he may still need to undergo cardioversion.  He      currently is in atrial flutter.  The patient's wife tells me that      Dr. Dorna Leitz does have plans in the next couple of months after the      inflammation of the surgery has subsided to possibly proceed with      cardioversion.  Whether or not the patient will require      antiarrhythmic therapy will need to be determined.  4. The patient was instructed to adhere to future endocarditis      prophylaxis.  5. An echocardiographic study will be obtained in 4 to 6 weeks to      reassess the patient's ejection fraction, and a followup CBC, BMET,      and BNP level will also be obtained.  6. The patient's wife told me that she will e-mail me the results of      the blood pressure readings, and we will adjust medications further      as needed.     [name], MD,FACC  Electronically Signed    GED/MedQ  DD: 05/19/2007  DT: 05/19/2007  Job #: [id number]   cc:   Murlean Hark, MD

## 2010-12-09 NOTE — Letter (Signed)
November 21, 2007    [facility]  [address] [address]  [place], [place]  [address]   RE:  [name], [name]  MRN:  [id number]  /  DOB:  1928-08-21   To whom this may concern at the [facility]:   I am referring [name], a patient with known valvular heart  disease status post aortic valve replacement and cardioversion for  evaluation of right arm pain and numbness. The patient mentioned these  symptoms during the last office visit and would like to seek medical  opinion. I have referred the patient to your practice. Please see my  last office note regarding the details of the patient's current  symptoms. If you have any further questions, please do not hesitate to  contact me.    Sincerely,      [name], MD,FACC  Electronically Signed    GED/MedQ  DD: 01/04/2008  DT: 01/04/2008  Job #: [id number]

## 2010-12-09 NOTE — Letter (Signed)
August 17, 2008    [name], MD,FACC  [address]  [place], [place] [address]   RE:  [name], [name]  MRN:  [id number]  /  DOB:  [birth year]   Dear [name],   It was a pleasure to see [name] today at your her request regarding  his recurrent atrial arrhythmias.   As you remember, he is an elderly gentleman who has a history of  endocarditis for which, he underwent mitral valve replacement with a  bioprosthesis in [facility] in 2008.  This occurred in the context of having  Strep viridans endocarditis, and I do not have all the details of the  timing of that.  He also underwent a Maze procedure.   Sometime subsequent to that, he was having atrial arrhythmias and post-  termination pauses.  Apparently, he underwent an EP study at where he  was found to have left atrial arrhythmias and a pacemaker was implanted.  Amiodarone was initiated and he was then rhythm free for some period of  time.   In December 2009, he had an episode where he had significant tachy  palpitations.  He presented to the hospital here.  He was found to be in  an atrial arrhythmia with a rate of about 110 and underwent overdrive  pacing with termination of that rhythm.   He then presented to hospital this last week with fevers, chills.  Blood  cultures were taken there were sterile after 48 hours and sterile at  this time.  Endocarditis is still a concern; however, he was also again  noted to be in his slow atrial tachycardia and again anti-tachycardial  pacing was undertaken.   After the recurrence of the events in December, amiodarone which had  been initiated by Dr. Romona Curls at [facility] was up titrated to 400 mg a day for  couple of weeks and has subsequently been down titrated to 200 mg a day.  The patient thinks that he feels better on the lower dose of amiodarone.  There is also concerns about gait instability, which may be attributable  to the amiodarone.   He has mixed ischemic and nonischemic heart  disease.  He is status post  bypass grafting as noted in September 2008 at the time of his Maze.  His  most recent ejection fraction was pretty good.  I should note that his  surgery was aortic valve replacement and mitral valve repair.  We do not  have data as to whether left atrial appendage was resected or not.   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   His past medical history in addition to above is notable for  hypertension and prostatic hypertrophy.   His family history is noncontributory.   SOCIAL HISTORY:  He is married.  He lives with his wife.  He does not  use alcohol or cigarettes.   REVIEW OF SYSTEMS:  Broadly reviewed and is most notable for the  instability and the fall.   His medications currently include:  1. Pravastatin 40.  2. Carvedilol 6.25 b.i.d.  3. Amiodarone 200 a day.  4. Amlodipine 10.  5. Coumadin.  6. Benazepril 40.  7. Lasix 40 b.i.d. titrated up and down by his wife related to his      edema.   He  has no known drug allergies.   PHYSICAL EXAMINATION:  GENERAL:  He is an older Caucasian male appearing  his stated age of 85, his blood pressure was 137/71, his pulse was 80,  and his weight was 172.  HEENT:  Demonstrated no icterus or xanthomata.  NECK:  Veins were flat.  His carotids were brisk.  There was no bruit  appreciated.  BACK:  Without kyphosis or scoliosis.  LUNGS:  Clear.  HEART:  Sounds were regular with the 2/6 holosystolic murmur at the  apex.  ABDOMEN:  Soft with active bowel sounds.  EXTREMITIES:  Femoral pulses were not examined.  Distal pulses were  trace.  There was no clubbing or cyanosis or edema.  He does have the  leg length discrepancy on the left and wears a prosthesis in his shoe.  NEUROLOGIC:  Grossly normal.  His speech was somewhat  hesitant.   His laboratories were reviewed from the old record and were normal.   Pacemaker was interrogated and unfortunately the flutter/atrial  tachycardia rate is below the programmable rate for atrial therapy using  the device.   IMPRESSION:  1. Recurrent atrial arrhythmias either a very slow flutter and atrial      tachycardia.  The cycle length is about 550 milliseconds.  2. Status post Maze operation at the time of aortic valve replacement      (bioprosthesis) and mitral valve repair/coronary artery bypass      grafting.  3. Unknown status as to the presence of a left atrial appendage.  4. Recurrent falls and questioned gait instability.  5. Somewhat symptomatically better on lower doses of amiodarone.  6. Antecedent history of bacterial endocarditis now with recurrent      episode of fevers and chills with blood cultures pending.  Negative      here till date.   [name], [name] has atrial tachycardia or very slow atrial flutter, which  was amenable to anti-tachycardia pacing, but unfortunately is below the  automatic program ability of the device.  That means, he will need to be  done manually.  I spoke with the family that in the event that it recurs  as it almost certainly will, but this is not an emergency unless his  symptoms dictate it and that the office can be called and that can be  done electively in the office.  This would easily be doable in  [place], as there is one of the P docs in the office almost every  day.   The other issue is amiodarone and whether it is contributing to his gait  instability versus neurotoxicity.  Neurotoxicity is typically quite dose-  dependent and so, if the symptoms resolve in large part as this dose is  down titrated, most of that will have to make [place] quite wary to using  amiodarone on long term.  Alternative strategies would be to consider  Dronedarone or catheter ablation using a CARTO system and Dr. Jenel Lucks  skills.   The  last question that I have is whether he has the left atrial  appendage or not.  In the event that he had surgical appendectomy, a  consideration will be given to the discontinuation of Coumadin, which  may be a value particularly in this man who is falling.   Thanks very much for allowing [place] to see him.    Sincerely,      [place] Salvia, MD, [facility]  Electronically Signed    SCK/MedQ  DD: 08/17/2008  DT: 08/18/2008  Job #: [id number]   CC:    Jesusita Oka Dr. Criss Alvine

## 2010-12-09 NOTE — Op Note (Signed)
NAME:  [name], [name] NO.:  [id number]   MEDICAL RECORD NO.:  [id number]          PATIENT TYPE:  AMB   LOCATION:  DSC                          FACILITY:  [facility]   PHYSICIAN:  Katy Fitch. Sypher, M.D. DATE OF BIRTH:  12/31/1928   DATE OF PROCEDURE:  02/28/2008  DATE OF DISCHARGE:                               OPERATIVE REPORT   PREOPERATIVE DIAGNOSIS:  Severe right carpal tunnel syndrome with thenar  atrophy.   POSTOPERATIVE DIAGNOSIS:  Severe right carpal tunnel syndrome with  thenar atrophy.   OPERATIONS:  1. Release of right transverse carpal ligament.  2. Palmaris longus transferred to abductor pollicis brevis and flexor      pollicis brevis in the manner of Camitz to reconstruct thumb palmar      abduction.   OPERATING SURGEON:  Katy Fitch. Sypher, MD   ASSISTANT:  Marveen Reeks. Dasnoit, PA-C.   ANESTHESIA:  General by LMA.   SUPERVISING ANESTHESIOLOGIST:  Zenon Mayo, MD   INDICATIONS:  [name]. Tracz is a 77 year old gentleman referred through  the courtesy of Dr. Avie Echevaria, attending neurologist for evaluation and  management of severe right carpal tunnel syndrome with thenar atrophy.  [name] was evaluated by Dr. Sandria Manly due to hand numbness and thenar  atrophy.  Clinical examination, electrodiagnostic studies confirmed  severe right carpal tunnel syndrome.  [name] has multiple background  medical problems including a cardiomyopathy, a history of aortic valve  replacement, mitral valve repair, and coronary artery bypass performed  at [facility].  He is followed by Dr. Willa Rough  at [facility] and Dr. Andee Lineman.   He has been on chronic Coumadin anticoagulation and was advised  preoperatively to wean from the Coumadin for 5 days preoperatively.  He  is now brought to the operating room anticipating right carpal tunnel  release and transfer of his palmaris longus to augment his palmar  abduction and MP flexion  muscles.   After informed consent, he is brought to the operating room at this  time.   [name]. Kalas is brought to the operating room and placed in supine  position on the operating table.   Following an anesthesia consult with Dr. Sampson Goon, general anesthesia  by LMA technique was recommended and accepted.   He was brought to room six, placed in supine position on the operating  table, and under Dr. Jarrett Ables direct supervision, general anesthesia  by LMA technique induced.   [name] right arm was prepped with Betadine soap solution and  sterilely draped.  Following exsanguination of the right arm with  Esmarch bandage, the arterial tourniquet was inflated to 220 mmHg.  The  procedure commenced with a traditional carpal tunnel incision  paralleling the thenar crease.  Subcutaneous tissues were carefully  divided taking care to isolate the palmar fascia and identified the  extension to the palmaris longus proximally.   The fascial extensions of the palmaris longus in the distal forearm were  released with scissors followed by mobilization of an extension of the  palmar fascia measuring 3.5  cm in length attached to the palmaris  longus.  This was placed under traction and penalized until a 1.5-cm  excursion was achieved.   The distal margin in the transverse carpal ligament was then identified  followed by release of the transverse carpal ligament along its ulnar  border extending into the distal forearm.  This widely opened carpal  canal.   The median nerve was noted have a hourglass deformity with hyperemia.  The motor branch was dissected distally and found to penetrate the  thenar fascia and transverse carpal ligament.  A formal neurolysis of  the thenar motor branch was not performed due to the fact that we found  a very significant level of entrapped neuropathy proximal to the motor  branch takeoff.   The margins of the transverse carpal ligament were inspected  for  bleeding points, which were then electrocauterized with bipolar current  followed by passage of the palmaris longus through a subcutaneous tunnel  to the radial aspect of the metacarpal phalangeal joint of the thumb.   The tendon of the flexor pollicis brevis and abductor pollicis brevis  was identified and a two past Pulvertaft weave accomplished with a  curved tendon braider secured by total of four mattress sutures of 3-0  Ethibond.   Very satisfactory palmar abduction of the thumb was achieved with a very  slight flexion moment at the MP joint.   The wounds were then repaired with intradermal 3-0 Prolene suture and  Steri-Strips followed by infiltration of the wounds with 2% lidocaine.  The hand and arm was then placed in a compressive dressing with a volar  plaster splint and thumb spica extension maintaining the thumb in full  palmar abduction.   There are no apparent complications.   [name] tolerated the surgery and anesthesia well.  He is transferred  to the recovery room in stable signs.   He and his wife have been advised to resume his Coumadin with a dose of  5 mg p.o. this evening followed by his routine dosing.  He will check  with Dr. Myrtis Ser' office in one week to check a INR and protime to  determine whether or not he has adequate anticoagulation established.       Katy Fitch Sypher, M.D.  Electronically Signed     RVS/MEDQ  D:  02/28/2008  T:  02/29/2008  Job:  [id number]   cc:   Genene Churn. Love, M.D.  Luis Abed, MD, [facility]

## 2010-12-09 NOTE — Assessment & Plan Note (Signed)
[facility]                          [facility] NOTE   NAME:WINNLarry, Alcock                  MRN:          [id number]  DATE:03/13/2008                            DOB:          10/09/1928    Mr. Gasper is doing very well.  There is an extensive note in the chart  from February 02, 2008.  He feels well.  He has had hand surgery by Dr. Laroy Apple.  He is doing very well.  He is not having any chest pain.  He  had had LV dysfunction, but this improved.  He hopes to have some dental  work done soon.   ALLERGIES:  No known drug allergies.   MEDICATIONS:  1. Benazepril 40.  2. Lasix 40.  3. Potassium 20.  4. Metamucil.  5. Coumadin.  6. Amiodarone 100 mg daily.  7. Study drug from [place].  8. Amlodipine 10 mg daily.   OTHER MEDICAL PROBLEMS:  See the list below.   REVIEW OF SYSTEMS:  He is feeling well.  His review of systems is  negative.   PHYSICAL EXAMINATION:  VITAL SIGNS:  Blood pressure is 111/68.  Heart  rate is 71.  Weight is 163 pounds.  GENERAL:  The patient is oriented to person, time, and place.  Affect is  normal.  HEENT:  Reveals no xanthelasma.  He has normal extraocular motion.  NECK:  There are no carotid bruits.  There is no jugular venous  distention.  LUNGS:  Clear.  Respiratory effort is not labored.  CARDIAC:  A 2-3/6 outflow murmur from his aortic prosthesis.  There is  no MR heard.  ABDOMEN:  Soft.  EXTREMITIES:  He has no significant peripheral edema.  The patient is  wearing a cast on his right arm after his wrist surgery.  Also, there  was question of whether he could have a remaining sutures at his  pacemaker site.  There is the slightest possibility that there may be a  very slight remaining segment, but I feel it is not appropriate to  manipulate this area.  There is no redness, heat, or swelling.  This can  be followed.   No labs were done today.   PROBLEMS:  Problems include:  1. Symptomatic  bradycardia with a dual-chamber pacemaker placed on      January 10, 2008.  2. Recurrent atrial flutter.  He is on amiodarone and Coumadin and      appears to be holding sinus rhythm since his cardioversion in June      2009.  3. Mixed ischemic and nonischemic cardiomyopathy.  His ejection      fraction had been 35%-40% in the past, but improved to 55%-60% by      recent echo.  4. Status post two-vessel coronary artery bypass graft at [facility] in      September 2008, aortic valve replacement with a bioprosthetic valve      at [facility] in 2008, and a mitral valve repair at [facility] in 2008.  5. History of endocarditis in the past prior to his valve surgery.  6. History of  atrial fibrillation with a Cox maze procedure done at      the time of his bypass.  7. Coumadin therapy that is chronic and ongoing at this point.  8. Mild carotid stenosis by history.  9. Hypertension.  10.BPH.  11.History of left hip and thigh fusion at age 46.  12.Status post total hip at age 45 on the left.  13.Carpal tunnel with surgery recently by Dr. Teressa Senter.   His pacer site is improving and looking well.  His rhythm is stable.  I  will not change his Coumadin at this time.  He is doing well after his  carpal tunnel surgery.  It is now, but a year since his heart surgery.  He would like to have dental work done.  From the cardiac viewpoint,  this can be allowed.  We will ask him to check with Dr. Stark Jock office  to see if there is any limitations after his hand surgery.     Luis Abed, MD, [facility]  Electronically Signed    JDK/MedQ  DD: 03/13/2008  DT: 03/14/2008  Job #: [id number]   cc:   Lajoyce Lauber, MD

## 2010-12-09 NOTE — Assessment & Plan Note (Signed)
[facility]                          [facility] NOTE   NAME:[name], [name]                  MRN:          [id number]  DATE:11/21/2007                            DOB:          10/05/1928    REFERRING PHYSICIAN:  Barrie Folk, M.D.   The patient is a 78-year male with a history of nonischemic  cardiomyopathy status post aortic bioprosthetic valve replacement and  mitral valve repair.  The patient's ejection fraction was 35-40%.  The  patient has been recently cardioverted by Dr. Romona Curls.  The patient is in  sinus bradycardia with a very slow rate of 43 beats per minute.  He  denies having substernal chest pain or shortness of breath.  He actually  participates in cardiac rehab.  The patient has been placed on  amiodarone and Coumadin.  The anticipated plan is for him to see Dr.  Romona Curls in July for another 48-hour Holter monitor.   MEDICATIONS:  1. Benazepril 40 mg p.o. daily.  2. Lasix 40 mg p.o. daily.  3. Potassium 20 mEq p.o. daily.  4. Norvasc 10 mg daily.  5. Metamucil.  6. Warfarin 2 mg a day as directed by Dr. Criss Alvine.  7. Amiodarone 200 mg p.o. daily.   PHYSICAL EXAMINATION:  VITAL SIGNS:  Blood pressure is 113/55, heart  rate is 46, weight is 160 pounds.  NECK:  Normal carotid stroke.  No carotid bruits.  LUNGS:  Clear breath sounds bilaterally.  No crackles or wheezing.  HEART:  Regular rate and rhythm.  Bradycardic with a normal S1-S2.  There is a 2/6 to 3/6 harsh systolic murmur at the left lower sternal  border and radiating to the apex as described previously.  ABDOMEN:  Soft, nontender.  No rebound or guarding.  EXTREMITIES:  No cyanosis, clubbing or edema.   PROBLEM LIST:  1. Nonischemic/ischemic cardiomyopathy.      a.     Ejection fraction 35-40%.      b.     NYHA class II.  2. Status post aortic valve replacement with bioprosthesis.  3. Status post mitral valve repair secondary to mitral  regurgitation.  4. Status post two-vessel coronary bypass grafting.  5. Cox Maze procedure.  6. Postoperative atrial flutter.      a.     Sinus bradycardia.      b.     Recurrent flutter status post cardioversion.  7. History of endocarditis (prior to valve surgery).  8. Coumadin anticoagulation secondary to postoperative atrial flutter.  9. Abnormal cardiac exam with a systolic murmur at the base and apex;      see details above.   PLAN:  1. The patient will need SBE prophylaxis in the future, and this has      been clearly explained.  2. Given the patient's profound sinus bradycardia, I will cut back on      his amiodarone to 100 mg p.o. daily.  3. I anticipate that the patient should stay on amiodarone until he      sees Dr. Romona Curls back for a 48-hour Holter monitor.  If he is in  normal sinus rhythm, I would be inclined to discontinue this.  4. Regarding anticoagulation, I would certainly extend this to a full      year post surgery.  My preference would be to repeat a CardioNet      monitor in November or October with no evidence of recurrent atrial      fibrillation.  I would also anticipate to discontinue warfarin at      that time.  5. The patient was given a schedule for amiodarone monitoring and      trying avoiding future side effects.  The patient will also give a      copy of this to Dr. Criss Alvine who can do the blood work.  6. The patient will follow up with me after his appointment with Dr.      Romona Curls.     [name], MD,FACC  Electronically Signed    GED/MedQ  DD: 11/21/2007  DT: 11/21/2007  Job #: [id number]   cc:   Barrie Folk, M.D.

## 2010-12-09 NOTE — Discharge Summary (Signed)
NAME:  [name], [name] NO.:  [id number]   MEDICAL RECORD NO.:  [id number]          PATIENT TYPE:  INP   LOCATION:  2034                         FACILITY:  [facility]   PHYSICIAN:  Duke Salvia, MD, FACCDATE OF BIRTH:  11-Jul-1929   DATE OF ADMISSION:  01/09/2008  DATE OF DISCHARGE:  01/11/2008                               DISCHARGE SUMMARY   FINAL DIAGNOSES:  1. Admitted to [facility] with syncope and bradycardia.  2. The patient was ruled out for pulmonary embolism by computed      tomogram of the chest on January 07, 2008.  3. CT of the head on January 07, 2008, no acute intracranial abnormality.  4. A 2-D echocardiogram on January 08, 2008, at [facility] ejection      fraction of 50%-55%.  5. Carotid duplex ultrasound done at [facility] not on our chart.  6. Suspected post termination pauses transferred to [facility] for pacemaker.  7. Discharge day 1 status post implant of a Medtronic EnRhythm dual-      chamber pacemaker.      a.     ATP attempted to convert atrial flutter in the       electrophysiologic lab without success.  8. Discharging day of direct current cardioversion atrial flutter      converted to sinus rhythm with a 150 joule shock.   SECONDARY DIAGNOSES:  1. Nonischemic cardiomyopathy/ischemic cardiomyopathy.      a.     Prior ejection fraction 35%-40%.      b.     New York Heart Association class II chronic systolic       congestive heart failure.  2. 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  3. Mitral valve annuloplasty, aortic valve replacement with      bioprosthetic valve on April 11, 2007.  4. Cox-Maze procedure done at the time of the valvuloplasty on      April 11, 2007.  5. Two-vessel coronary artery bypass graft surgery on April 11, 2007.  6. Post surgery atrial flutter.      a.     Left sided, unable to ablate at [facility].      b.     Recurrent flutter after cardioversion in January  2009.      c.     Started on amiodarone about 1 month ago.  7. Status post left hip and thigh fusion at age 89.  41. Status post left total hip arthroplasty at age 37.  38. Status post hiatal hernia repair.  10.Hypertension.  11.Benign prostatic hypertrophy with prior prostate biopsy benign.   PROCEDURES:  1. On January 10, 2008, implant of the Medtronic EnRhythm dual-chamber      pacemaker, Dr. Sherryl Manges.  The patient had no postprocedural      complications.  No hematoma at the pacer pocket.  Interrogation      showed the device within normal limits.  Chest x-ray shows no      pneumothorax.  Leads in appropriate position.  2.  On January 11, 2008, direct current cardioversion, atrial flutter to      sinus rhythm with a 150 joule shock, Dr. Sherryl Manges.   BRIEF HISTORY:  [name] is a 77 year old male.  He has a history of  valvulopathy.  He had endocarditis, and then after treatment aortic  valve replacement with a bioprosthetic valve, mitral valve annuloplasty  for mitral regurgitation and then in a combined procedure the Cox-Maze  procedure as well as two-vessel coronary artery bypass graft surgery all  done on April 10, 2008.  The patient did have atrial flutter after  the surgery.  This was identified as a left-sided flutter.  He has had  cardioversion in January 2009, which did not maintain sinus rhythm and  then was started on amiodarone.   On Saturday, January 07, 2008, the patient was outside of the garden  briefly after about 10 minutes in the garden he came in to read the  paper.  He read about 1 page and then suddenly fell warmth involving his  head.  He became diaphoretic.  He called to his wife and the next room.  When she arrived, she noted that he had passed out.  She called 911.   The patient was admitted to [facility].  Extensive workup was  accomplished including a new echocardiogram which showed ejection  fraction had improved to 50%-55%.  He had CT of the  chest which ruled  out pulmonary embolism.  He had a CT of the head which showed no acute  intracranial process.  He also underwent carotid duplex ultrasound.  The  results of that study are not available on discharge chart.  He was  assessed on telemetry and known to be bradycardic.  He also is suspected  of having post atrial flutter termination pauses.  Because of this, he  was transferred to [facility] for possible pacemaker  and possible atrial flutter ablation.   HOSPITAL COURSE:  The patient presented to [facility] on December 30, 2007, at about 4 o'clock.  Dr. Andee Lineman had been in communication with  the electrophysiologists, both Dr. Lewayne Bunting and Dr. Sherryl Manges.  The patient was set up for pacemaker implantation the following day on  September 16.  Careful study of the electrocardiograms and telemetry  showed that the flutter was probably left-sided and the patient would be  able to have the ablation therapy in our electrophysiology lab.  In  addition, the ejection fraction had improved from the prior study to 50%-  555 and he was not a candidate for cardioverter defibrillator.  Medtronic dual-chamber pacemaker was implanted on January 10, 2008.  In the  EP lab, the patient underwent antitachycardia pacing in an effort to  overdrive pace out of atrial flutter.  This was unsuccessful.  Cardioversion was set up for the next day on January 11, 2008.  This was  done and successful.  The patient maintaining sinus rhythm for least a 3-  hour.  After the procedure, the patient was discharged the same day on  January 11, 2008.   [name] discharges on the following medication:  1. Pravastatin 40 mg daily at bedtime.  2. Carvedilol 6.25 mg twice daily.  3. Furosemide 40 mg daily.  4. Benazepril 40 mg daily.  5. Warfarin 3 mg Monday, Wednesday, Friday, and Saturday and 4 mg      Tuesday, Thursday, and Sunday.  6. Amiodarone 100 mg daily.  7. Oxybutynin 5 mg  daily.   8. Potassium chloride 20 mEq daily.  9. Amlodipine 10 mg daily.  10.Multivitamin daily.  11.Metamucil 1 packet daily.  12.Nitroglycerin 0.4 mg one tablet under the tongue every 5 minutes x3      doses as needed for chest pain.   I might add that the patient's protime has been monitored during this  hospitalization and has been within normal limits for atrial flutter a  target between 2 and 3 INR.  The patient will follow up at both [facility] in [place] and also [facility] in [place].  1. Appointment on Monday, January 23, 2008, at 9:20 at the [facility] in [place].  2. Follow up with Dr. Criss Alvine for protime in about 10 days, this      appointment has already been scheduled.  3. He will see Dr. Myrtis Ser at Dr. Margarita Mail office on Thursday, February 02, 2008, at 3 o'clock, the purpose of this visit will be to check to      see if the patient is still in sinus rhythm and to check to see if      he has had any further syncopal events.  The patient has been asked      not to drive until he sees Dr. Myrtis Ser in the office on February 02, 2008.  4. See Dr. Graciela Husbands, [facility] in [place] office on Friday,      April 20, 2008, at 2 o'clock for reinterrogation of the      pacemaker.   LAB STUDIES THIS ADMISSION:  On January 11, 2008, sodium 138, potassium  3.8, chloride 104, carbonate 28, BUN is 24, creatinine 1.23, and glucose  126.  Protime on day of discharge 29.2 and INR is 2.7.      Maple Mirza, [place]      Duke Salvia, MD, [facility]  Electronically Signed    GM/MEDQ  D:  01/11/2008  T:  01/12/2008  Job:  [id number]   cc:   [name], MD,FACC  Duke Salvia, MD, [facility]  Carmelo Milano  Maryclare Labrador

## 2010-12-09 NOTE — Assessment & Plan Note (Signed)
[facility]                          [facility]   NAME:[name], [name]                  MRN:          [id number]  DATE:01/23/2009                            DOB:          1928/08/12    REFERRING PHYSICIAN:  Albertha Ghee, MD   HISTORY OF PRESENT ILLNESS:  The patient is a 77 year old male with a  history of left-sided atrial flutter with a most recent admission in  January 2010, when his pacemaker was checked in at the upper rate.  He  is also status post aortic valve replacement and mitral valve repair, as  well as a Cox maze procedure.  He is on amiodarone therapy for  maintenance of normal sinus rhythm.  The patient presents for followup.  Overall, he stated he has been doing well.  He was seen by  electrophysiologist, Dr. Graciela Husbands.  He was worried that he had gait  instability versus neurotoxicity from amiodarone.  However, I felt this  is very unlikely.  The patient had significant problems with  degenerative hip disorder and clinically there is absolutely no evidence  that he has amiodarone toxicity particularly at this low dose.  The  patient also states that he feels quite good and that he is able to  ambulate with a walker without any difficulty.  There have been no  recent falls.  The patient denies chest pain, shortness of breath,  orthopnea or PND.  He does report some increasing lower extremity edema.   MEDICATIONS:  1. Pravastatin 40 mg p.o. nightly.  2. Benazepril 40 mg p.o. daily.  3. Coumadin as directed and followed by Dr. Criss Alvine.  4. Oxybutynin 5 mg p.o. daily.  5. Amlodipine 10 mg p.o. daily.  6. Multivitamin daily.  7. Aspirin 81 mg daily p.o. daily.  8. Metamucil 1 pack daily.  9. Lasix 40 mg p.o. b.i.d.  10.Amiodarone 200 mg p.o. daily.  11.Carvedilol 6.25 b.i.d.  12.Potassium supplements 20 mg p.o. b.i.d.   PHYSICAL EXAMINATION:  VITAL SIGNS:  Blood pressure 111/66, heart rate  70, weight 177  pounds.  GENERAL:  A well-nourished white male in no apparent distress.  HEENT:  Pupils are isocoric.  Conjunctivae are clear.  NECK:  Supple.  Normal carotid upstroke.  No carotid bruits.  LUNGS:  Clear breath sounds bilaterally.  HEART:  Regular rate and rhythm with normal S1 and S2.  There is a 2/6  holosystolic murmur at the apex.  ABDOMEN:  Soft, nontender.  EXTREMITIES:  No cyanosis.  There is no clubbing, but there is some  edema.   PROBLEM LIST:  1. Symptomatic bradycardia status post dual-chamber pacemaker      implantation Medtronic device on January 10, 2008.  2. Recurrent left-sided atrial flutter.      a.     Amiodarone therapy.      b.     Coumadin therapy.      c.     Prior history of cardioversion and overdrive pacing.      d.     Maintenance on normal sinus rhythm.  3. Mixed ischemic and nonischemic cardiomyopathy with  previous      ejection fraction of 35-40%, most recently in June 2009 normalized      to 55-60%.  4. Coronary artery disease status post coronary artery bypass grafting      x2 at [facility] in September 2008.  5. Valvular heart disease.      a.     Status post aortic valve replacement to bioprosthetic valve       at [facility] in September 2008.      b.     Status post mitral valve repair secondary to mitral       regurgitation in December 2008 also at [facility].      c.     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  6. History of atrial fibrillation status post Cox maze procedure.  7. Chronic Coumadin therapy.  8. Less than 50% bilateral internal carotid artery stenosis by      Dopplers 2009.  9. Hypertension, controlled.  10.Benign prostatic hypertrophy.  11.Left hip and thigh fusion at age 62.  12.Status post left total hip replacement at age 5.  13.Carpal tunnel syndrome.   PLAN:  1. From my clinical perspective, I have no concern about neurotoxicity      from amiodarone therapy.  The patient has a history of left hip and       thigh fusion at age 21 and total hip replacement, which is quite      acceptable explanation for his somewhat unstable gait requiring a      walker.  2. The patient does appear to be somewhat fluid overloaded and I asked      him to increase his Lasix to 60 mg twice a day.  I did gave him an      order to follow up with Dr. Criss Alvine carefully and followup his      creatinine as well as potassium levels.  3. At this point, we will continue amiodarone.  He will also need      every 6 months TSH and liver function test done to follow for      possible amiodarone toxicity.  4. Hemodynamically, the patient is actually quite well compensated.      In short of the Lasix, I have made no further changes in medical      regimen.     [name], MD,FACC  Electronically Signed    GED/MedQ  DD: 01/24/2009  DT: 01/24/2009  Job #: [id number]   cc:   Albertha Ghee, MD

## 2010-12-09 NOTE — Op Note (Signed)
NAME:  [name], [name] NO.:  [id number]   MEDICAL RECORD NO.:  [id number]          PATIENT TYPE:  INP   LOCATION:  2034                         FACILITY:  [facility]   PHYSICIAN:  Duke Salvia, MD, FACCDATE OF BIRTH:  1929/04/09   DATE OF PROCEDURE:  01/10/2008  DATE OF DISCHARGE:  01/11/2008                               OPERATIVE REPORT   PREOPERATIVE DIAGNOSES:  Syncope and bradycardia.   POSTOPERATIVE DIAGNOSIS:  Syncope and bradycardia.   PROCEDURE:  Dual-chamber pacemaker insertion with attempted termination  of atrial arrhythmias persistent post maze procedure.   Following obtaining informed consent, the patient was brought to the  electrophysiology laboratory and placed on the fluoroscopic table in  supine position.  After routine prep and drape of the left upper chest,  lidocaine was infiltrated into the pre and subclavicular region.  An  incision was made and carried down to the level of the prepectoral  fascia with electrocautery and sharp dissection.  A pocket was formed  similarly.  Hemostasis was obtained.   Thereafter, attention was turned to gain access to the extrathoracic  left subclavian vein, which was accomplished without difficulty without  the aspiration of air and without puncturing the artery.  Two separate  venipunctures were accomplished.  Guidewires were placed and retained,  and sequentially, a Medtronic 4076, 52-cm active fixation atrial lead,  serial number [id number] and a Medtronic 4076, 58-cm active fixation  ventricular lead, serial number [id number] placed under fluoroscopic  guidance to the right ventricle and the right atrium respectively.  The  locations are not recalled at this time.  The bipolar R wave was 14.1  with pace impedance of 906 and threshold of 0.8 at 0.5.  The current at  threshold was 0.9 mA and the current of injury was prominent.   The bipolar flutter wave was 1.1 with a pace impedance of 589 and  threshold of 1.7 V at 0.5 msec.  The current threshold was 1.2 mA.  The  current of injury was brisk.   These leads were secured to the prepectoral fascia and were then  attached to Medtronic EnRhythm P1501 pulse generator, serial number  [id number].  The pocket was copiously irrigated with antibiotic  containing saline solution.  Hemostasis was assured and the leads and  the pulse generator was placed in the pocket, secured to the  prepectoral fascia, and the wound was then closed in three layers in  normal fashion.  The wound was washed, dried, and a benzoin and Steri-  Strip dressing was applied.  Needle counts, sponge counts, and  instrument counts were correct at the end of the procedure according to  the staff.      Duke Salvia, MD, [facility]  Electronically Signed     SCK/MEDQ  D:  04/05/2008  T:  04/05/2008  Job:  [id number]   cc:   Albertha Ghee, MD  [name], MD,FACC  G. Romona Curls, MD

## 2010-12-12 NOTE — Cardiovascular Report (Signed)
NAME:  WADDELL, ITEN NO.:  [id number]   MEDICAL RECORD NO.:  [id number]          PATIENT TYPE:  OIB   LOCATION:  1963                         FACILITY:  [facility]   PHYSICIAN:  Arturo Morton. Riley Kill, MD, FACCDATE OF BIRTH:  1929/06/21   DATE OF PROCEDURE:  05/12/2006  DATE OF DISCHARGE:  05/12/2006                              CARDIAC CATHETERIZATION   INDICATIONS:  Mr. Guarisco is a delightful 77 year old gentleman who comes in  for further evaluation.  He has had some mild symptoms.  His studies have  suggested a significant reduction in overall left ventricular function and a  redistribution defect in the lateral wall.  The patient has had long  standing aortic regurgitation as well as atrial fibrillation.  His aortic  regurgitation has been thought to be moderate, but Dr. Andee Lineman thought the LV  function was out of proportion to the presence of coronary findings on the  radionuclide imaging and felt that right and left heart catheterization was  indicated.   PROCEDURE:  1. Right left heart catheterization.  2. Selective coronary arteriography.  3. Selective left ventriculography.   DESCRIPTION OF PROCEDURE:  The patient was taken to the cath lab and prepped  and draped in the usual fashion.  Following informed consent, through an  anterior puncture, the right femoral vein was entered and a 7-French sheath  placed. A 7.5 French thermodilution Swan-Ganz catheter was then taken to the  superior vena cava where saturations were obtained.  Serial pressures were  obtained in the right heart. Pulmonary capillary wedge pressure was  recorded.  Thermodilution cardiac outputs were performed.  Following this,  the right femoral artery was entered and a 5-French sheath was placed.  Central aortic and left ventricular pressures were measured with the  pigtail.  Ventriculography was performed.  Following a pullback, proximal  aortic root aortography was performed.  This was  followed by coronary  arteriography.  All of this was accomplished without complication and he was  taken to the holding area in satisfactory clinical condition.   ANGIOGRAPHIC DATA:  1. Ventriculography was performed in the RAO projection.  The overall      ejection fraction was somewhat hard to determine due to the patient's      atrial fibrillation.  I would estimate the ejection fraction in the      range of 30-40%, but again it is difficult to be sure.  There is not a      definite wall motion abnormality, although the inferior base is      somewhat hypokinetic.   1. Aortic root aortography was performed.  There is mild dilatation of the      aortic root.  There is at least possibly moderately severe aortic      regurgitation.  The ventricle nearly fills completely.   1. The right coronary artery is a moderate size vessel.  There is a fair      amount of ectasia throughout the right coronary.  There is a small PD      branch and then the posterior descending  is a fairly large vessel and      actually divides proximally and there is segmental irregularities      throughout the point of division, although it does not appear to be      critical stenoses. The posterolateral segment is diffusely and      segmentally irregular but without critical narrowing. On late filling,      the circumflex fills by retrograde collaterals in the distribution of      the marginal branch.   1. The left main fills and is free of critical disease.   1. The circumflex coronary artery provides some irregularity in the first      marginal branch with bumpy irregularity throughout. The second      significant marginal branch is basically totally occluded, and does      fill by retrograde collaterals as noted from the right coronary artery.      The AV circumflex is irregular throughout, but supplies only a tiny      posterolateral segment.   1. The proximal left anterior descending artery is without  critical      disease. The LAD proper demonstrates segmental plaquing of 30-40%      within its mid portion at the takeoff of its two major diagonal      branches.  There are two sequential diagonal branches, both of them      have a lot of segmental irregularities of 30-40% throughout and one has      about a 60% area of narrowing just before it divides and that      represents predominantly the second diagonal. The LAD proper does      contain luminal irregularities, but is without critical disease going      all the way down to the apex.   CONCLUSION:  1. Reduced overall LV function out of proportion to the degree of coronary      artery disease.  2. Moderately severe and cannot exclude severe aortic regurgitation.  3. Persistent atrial fibrillation.  4. 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   DISPOSITION:  I have discussed the case in detail with Dr. Andee Lineman.  He will  see him back and the patient really does need a TEE to determine the issue  with regard to the aortic regurgitation.  His examination is really quite  impressive suggesting more severe aortic regurgitation.  Dr. Andee Lineman will  make the final disposition.      Arturo Morton. Riley Kill, MD, [facility]  Electronically Signed     TDS/MEDQ  D:  05/13/2006  T:  05/14/2006  Job:  [id number]

## 2010-12-12 NOTE — Assessment & Plan Note (Signed)
[facility]                            [facility]   NAME:[name], [name]                  MRN:          [id number]  DATE:04/29/2006                            DOB:          05/09/1929    REFERRING PHYSICIAN:  Dr. Alta Corning III at [facility], [address] [place], [place]  [address].   HISTORY OF PRESENT ILLNESS:  The patient is a pleasant 77 year old male  husband of Ms. Sharee Pimple who is a patient of mine.  The patient has a history of  chronic atrial fibrillation for which he is on Coumadin.  He also has a  history of aortic insufficiency with a late diastolic murmur all the way up  to S1.  The patient has mild aortic insufficiency by echo.  He denies any  substernal chest pain and had a prior stress test done in 1995 which was  within normal limits.  There is no history of prior cardiac catheterization.  The patient denies any chest pain.  He has no orthopnea or PND.  The patient  has problems with chronic arthritis and is status post left hip replacement  and left knee cap surgery as well as left hip replacement redo.   The patient is here to establish with a new cardiologist.  He was seen  previously by Dr. Jim Desanctis and Dr. Mayford Knife in [place] and  echocardiographic report is in the chart today which showed normal LV  function and mild-to-moderate aortic insufficiency and mild mitral  regurgitation.  Previously the patient had been taking antibiotic  prophylaxis.  From a cardiac perspective the patient appears to be doing  well. He denies any palpitations or syncope.   MEDICATIONS:  1. Caduet 5 mg/10 mg p.o. daily.  2. Benazepril HCl 40 mg p.o. daily.  3. Warfarin.  4. Sodium.  5. Oxybutynin.  6. Multivitamin.   PAST MEDICAL HISTORY:  See problem list below.   SOCIAL HISTORY:  The patient is retired from [employer] in  [place], [place].  He is married for 50 years and has 3 children.   Mother died at age 56 from cancer.  Father died at age 67 from heart attack  or stroke.  Sisters are age 32 and 49.   REVIEW OF SYSTEMS:  As per HPI.  No nausea or vomiting.  No fever or chills.  No abdominal pain, no dysuria or frequency.  The remainder of the review of  systems is negative.  Short of chronic hip and joint pain secondary to  arthritis.   PHYSICAL EXAMINATION:  VITAL SIGNS:  Blood pressure is 142/74, heart rate is  84 beats/minute.  NECK EXAM:  Reveals normal carotid upstroke.  No carotid bruits.  LUNGS:  Clear breath sounds bilaterally.  HEART:  Irregular rate and rhythm, normal S1-S2 and at the right upper  sternal border as well as the left upper sternal border there is a soft  systolic ejection murmur and a long diastolic murmur extending all the way  up to S1 with an intensity of approximately 2-3/6.  The PMI is not  displaced.  There is no  definite S3.  ABDOMEN:  Soft, nontender.  No rebound or guarding.  EXTREMITIES EXAM:  No clubbing, cyanosis, or edema.  NEUROLOGIC:  The patient is alert, oriented, grossly nonfocal.   PROBLEM LIST:  1. Atrial fibrillation, rate controlled.  2. Coumadin anticoagulation.  3. Mild-to-moderate aortic insufficiency.  4. Diastolic murmur on exam.  5. History of left hip surgery with redo.  6. History of hypertension.  7. History of benign prostatic hypertrophy.  8. Dyslipidemia.   PLAN:  1. From a perspective of atrial fibrillation the patient's heart rate      appears to be well-controlled.  No further change in medication is      required at this point in time.  2. The patient is followed by Dr. Criss Alvine regarding his Coumadin and is      monitored.  I did recommend a medical alert bracelet.  3. The patient has not had any recent stress testing done and we have      scheduled her for an adenosine Cardiolite study.  4. From the perspective of antibiotic prophylaxis, given the new      guidelines not particularly needed in  the setting of his aortic      insufficiency.  On exam his aortic insufficiency although loud is mild      based on the duration of the diastolic murmur.  5. The patient can follow up with [place] in 6 months.       [name], MD,FACC     GED/MedQ  DD:  04/29/2006  DT:  04/30/2006  Job #:  [id number]   cc:   Christy Sartorius, MD

## 2010-12-12 NOTE — Assessment & Plan Note (Signed)
[facility]                            [facility] NOTE   NAME:WINNBronsyn, Shappell                  MRN:          [id number]  DATE:06/04/2006                            DOB:          [birth year]    REFERRING PHYSICIAN:  Dr. Rosanna Randy   HISTORY OF PRESENT ILLNESS:  The patient is a 77 year old male with a  history of chronic atrial fibrillation. The patient underwent recent cardiac  catheterization with a finding of significant LV dysfunction. His ejection  fraction is 30-35%. Catheterization demonstrated no obstructive coronary  artery disease with total occlusion of the circumflex marginal with  retrograde filling. It was not certain whether the patient could have  moderate to severe aortic regurgitation. He does have a diastolic aortic  murmur on exam. Following his catheterization, the patient underwent  transesophageal echocardiographic study. This confirmed the presence of an  ejection fraction of 35%. The patient had mild aortic insufficiency with a  prolapsed cusp. He also has mild to moderate mitral regurgitation. It was  not felt that his valvular heart disease was the cause of his  cardiomyopathy. The patient was placed on beta blocker therapy for improved  control of his atrial fibrillation. He now has been placed on a CardioNet  monitor to assess his rate control. He has been 4 days on the monitor and he  appears to have adequate rate control with heart rates between 50-70. He  also felt that he has improved. He denies any substernal chest pain,  shortness of breath, orthopnea or PND. He has no palpitations or syncope.   MEDICATIONS:  Please see listed in the chart. This includes:  1. Caduet 5/10 mg p.o. daily.  2. Benazepril 40 mg a day.  3. Coumadin as directed.  4. Multivitamin.  5. Bisoprolol 2.5 mg p.o. daily.   PHYSICAL EXAMINATION:  VITAL SIGNS:  Blood pressure is 120/60, heart rate  64, weight is 197  pounds.  NECK:  Reveals normal carotid upstroke and no carotid bruits.  LUNGS:  Clear bilaterally.  HEART:  Reveal regular rate and rhythm with normal S1, S2. There is a soft 2-  3/6 diastolic murmur at the left upper sternal border. Heart rate is  irregular.  LUNGS:  Clear.  EXTREMITIES:  Reveals no cyanosis, clubbing or edema.   PROBLEM LIST:  1. Nonischemic cardiomyopathy, ejection fraction 35%.  2. Mild aortic insufficiency.  3. Mild to moderate mitral regurgitation.  4. 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  5. Permanent atrial fibrillation with rate control.  6. History of hypertension.  7. History of benign prostatic hypertrophy.   PLAN:  1. The patient seems to be tolerating his bisoprolol well. His heart rate      is well-controlled. He has 2 more weeks of monitoring with the      CardioNet monitor which we will review.  2. The patient will follow back up 6 months to reassess his ejection      fraction. This needs to be followed on a longitudinal basis going      forward.  3. The patient may be  at risk for sleep apnea and we have ordered an apnea      link for him.     [name], MD,FACC  Electronically Signed    GED/MedQ  DD: 06/04/2006  DT: 06/04/2006  Job #: [id number]

## 2010-12-12 NOTE — Assessment & Plan Note (Signed)
[facility]                          [facility] NOTE   NAME:[name], [name]                  MRN:          [id number]  DATE:11/23/2006                            DOB:          25-Jun-1929    REFERRING PHYSICIAN:  Alta Corning, MD   HISTORY OF PRESENT ILLNESS:  The patient is a 77 year old male with a  history of chronic atrial fibrillation and nonischemic cardiomyopathy  with ejection fraction of 35%. The patient has known aortic  insufficiency and mitral regurgitation. It is felt that his ejection  fraction is out of proportion to the degree of his valvular heart  disease. Of note was at the time of catheterization, his AI was noted to  be more severe. He had recent hospitalization with congestive heart  failure largely due to volume overload. The patient is currently NYHA  class 2B. He has some difficulty getting around due to chronic arthritic  back and hip problems. The patient states that he has had no chest pain,  he has markedly improved and his weight is down about 8 pounds since his  last visit. The patient denies any chest pain, palpitations or syncope.   MEDICATIONS:  1. Benazepril 40 mg a day.  2. Coumadin as directed.  3. Pravastatin 40 daily.  4. Coreg 3.125 b.i.d.  5. Detrol LA 2 mg p.o. daily.  6. __________  40 mg a day.  7. Spironolactone 25 mg p.o. daily.   PHYSICAL EXAMINATION:  VITAL SIGNS:  Blood pressure is 155/74, heart  rate 72 beats per minute, weight 181 pounds.  NECK:  Normal carotid upstrokes, no carotid bruits.  LUNGS:  Clear breath sounds bilaterally.  HEART:  Regular rate and rhythm. Normal S1, S2. There is a diastolic  murmur at the left sternal border.  ABDOMEN:  Soft.  EXTREMITIES:  No cyanosis, clubbing or edema.   PROBLEM LIST:  1. Nonischemic cardiomyopathy with ejection fraction 35% (also total      occlusion of circumflex retrograde filling out of proportion to the      ejection  fraction).  2. Aortic insufficiency mild to moderate by TEE, possibly severe by      catheterization.  3. Moderate mitral regurgitation.  4. Status post heart failure with volume overload.  5. Atrial fibrillation with rate control.  6. History of hypertension.   PLAN:  1. The patient is doing remarkably well after his recent      hospitalization. His weight is down and he has controlled his      volume. He really has cut back on his salt intake.  2. I have made no change in the medical regimen.  3. The patient remains an NYHA class 2B. His ejection fraction is out      of proportion to his degree of coronary disease and also appears to      be out of proportion to his degree of valvular heart disease. At      this point, I do not feel that is a surgical candidate. His      situation is also complicated by the fact that he has  chronic      arthritic hip and back problems which cause him to walk at a      limited pace with a walker.  4. We will send the patient to [facility] to see Dr. Regino Schultze for a      second opinion for evaluation of his aortic insufficiency and      mitral regurgitation. I have also asked Dr. Regino Schultze to review the      catheterization films __________ that the patient's aortic      insufficiency was more severe.     [name], MD,FACC     GED/MedQ  DD: 11/23/2006  DT: 11/23/2006  Job #: [id number]   cc:   Nat Christen, MD

## 2010-12-12 NOTE — Assessment & Plan Note (Signed)
[facility]                          [facility] OFF[facility] NOTE   NAME:[name], [name]                  MRN:          [id number]  DATE:10/11/2006                            DOB:          1928/10/01    REFERRING PHYSICIAN:  Alta Corning, Dr.   ADMISSION HISTORY AND PHYSICAL.   REASON FOR ADMISSION:  Congestive heart failure.   HISTORY OF PRESENT ILLNESS:  The patient is a 77 year old male with a  history of chronic atrial fibrillation and ischemic cardiomyopathy.  The  patient was last evaluated on September 13, 2006, in the office.  The  patient was found to have increased symptoms of dyspnea.  I had concerns  regarding worsening valvular heart disease.  The patient underwent a TEE  to reassess his aortic insufficiency and mitral regurgitation; aortic  insufficiency still sounded mild by physical examination, however.  It  was also felt that physical therapy was contributing to the patient's  decreased exercise tolerance, potentially worsening his aortic  insufficiency.  The transesophageal echocardiographic study showed mild  aortic insufficiency secondary to cusp prolapse as well as moderate to  moderately severe mitral regurgitation.  There was biventricular failure  with LV and RV dysfunction.  Ejection fraction was estimated at 30-35%.   PAST MEDICAL HISTORY:  1. Catheterization was done by Dr. Riley Kill.  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  He had no other significant coronary artery      disease, ejection fraction was 30-35% by catheterization, he was      felt to have significant aortic regurgitation.  This was      interpreted as mild to severe.  We called back later on for      catheterization report result and the patient's PA pressures were      essentially within near normal limits.  2. The patient now presents to the office after he developed  increased      symptoms of dyspnea.  Interestingly, since his procedure he had a      weight gain of 10 pounds.  He also is quite hypertensive with blood      pressure 170/74.  He states, I was unable to lie down at home      without getting a smothering feeling.  He also has shortness of      breath on minimal exertion.  In the office, saturation dropped from      91 to 86% just moving from the table to the chair.  He denies,      however, any substernal chest pain; he remains in atrial      fibrillation but his rate is controlled.  He has no acute ischemic      changes by EKG.   MEDICATIONS:  1. Benazepril 40 mg a day.  2. Coumadin as directed.  3. Multivitamin.  4. Pravastatin 40 mg a day.  5. Coreg 3.125 b.i.d.  6. Lasix 40 mg 1/2 tab p.o. daily.  7. Detrol LA 2 mg p.o. daily.  ALLERGIES:  NO KNOWN DRUG ALLERGIES.   SOCIAL HISTORY:  1. The patient lives in [place] with his wife, he does not smoke      or drink.  2. The patient is retired from [employer] in [place].  3. He is married for 50 years and has 3 children.   FAMILY HISTORY:  1. Mother died at age 85 from cancer.  2. Father died at age 45 from heart attack or stroke.  3. He has sisters that are age 84 and 90.   REVIEW OF SYSTEMS:  As per HPI.  Dyspnea and orthopnea as well as PND.  No fever or chills, no chest pain, no melena or hematochezia, no dysuria  or frequency.  Chronic hip pain and joint pain secondary to arthritis.  Remainder of review of systems is negative.   PHYSICAL EXAMINATION:  VITAL SIGNS:  Blood pressure 170/74, heart rate  is 95 b.p.m., weight is 197 pounds which is up from 189.  GENERAL:  A somewhat cyanotic-appearing white male in mild respiratory  distress.  HEENT:  Pupils are isocoric, conjunctiva clear.  NECK:  Supple, normal carotid upstroke.  The JVD is markedly elevated at  10 cm and normal carotid upstroke, no carotid bruits.  LUNGS:  Clear breath sounds  bilaterally.  HEART:  Regular rate and rhythm with normal S1, S2.  No murmur, rubs or  gallops.  ABDOMEN:  Soft and nontender.  EXTREMITY:  No cyanosis, clubbing or edema.  NEURO:  Patient alert, oriented, grossly nonfocal.   PROBLEM LIST:  1. Nonischemic cardiomyopathy, ejection fraction 35%.  2. History of aortic insufficiency, mild to moderate by      transesophageal echocardiogram, possibly severe by catheterization.  3. Moderate mitral regurgitation.  4. Heart failure with volume overload.  5. Normal right heart catheterization data previously.  6. 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  7. Atrial fibrillation with rate control.  8. History of hypertension, poorly controlled.   PLAN:  1. The patient presents with congestive heart failure, appears to be      volume overloaded.  2. His Lasix was cut back recently and this may have contributed to      his symptomatology.  3. The patient will be admitted for IV diuresis.  He was placed on      oxygen and nitro paste with marked immediate improvement in his      symptoms.  4. The patient will be ruled out for myocardial infarction.  5. I will make a CD copy of the TEE and his catheterization and will      ask for a second opinion from Dr. Modesto Charon at [facility].  I do      not think the patient is a candidate for valve replacement given      the lack of severity of both AI and mitral regurgitation.  He does      have LV dysfunction, ejection fraction of 30-35% as well as RV      dysfunction.   Patient will be admitted for diuresis and will continue to make  decisions based on his improvement.     [name], MD,FACC  Electronically Signed    GED/MedQ  DD: 10/11/2006  DT: 10/11/2006  Job #: [id number]   cc:   Alta Corning, Dr.

## 2010-12-12 NOTE — Assessment & Plan Note (Signed)
[facility]                          [facility] NOTE   NAME:[name], [name]                  MRN:          [id number]  DATE:09/13/2006                            DOB:          [birth year]    REFERRING PHYSICIAN:  Dr. Criss Becker   HISTORY OF PRESENT ILLNESS:  The patient is a 77 year old male with a  history of chronic atrial fibrillation and nonischemic cardiomyopathy.  The patient was last evaluated in the office on June 04, 2006. He  presented after a recent catheterization performed by Dr. [name]. 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 He had no other significant  coronary artery disease. His ejection fraction was 30-35%. By  catheterization, he is felt to have significant aortic regurgitation.  This was read as moderate to severe. Unfortunately during the  catheterization in reviewing the report, no pressures were reported and  also the right heart catheterization data are currently missing from the  dictated report from that catheterization. However, the patient in  followup had a transesophageal echocardiographic study done which showed  only mild aortic insufficiency with a prolapsed cusp. He also had mild  to moderate mitral regurgitation. At the time, it was felt that his  valvular heart disease was not contributing to his cardiomyopathy. The  patient was at that time placed on a low-dose beta blocker therapy and  given the options of severe aortic insufficiency. In followup on  June 04, 2006, the patient stated that he was doing well.   The patient now has been referred actually by his wife who noticed that  he has developed increased dyspnea on minimal exertion. She questions  whether this could be attributed to the bisoprolol. She also noticed  that he has significant loss of stamina and she feels that he is not  quite the same.   The patient  reports that right after Christmas he had an episode of  sinusitis complicated by post nasal drip. Since that time, he has had  difficulty sleeping, had some loud snoring and has been increasingly  fatigued.   A 12-lead electrocardiogram  in the office today demonstrates atrial  fibrillation with left ventricular hypertrophy.   MEDICATIONS:  1. Caduet 5/10 mg p.o. daily.  2. Benazepril 40 mg p.o. daily.  3. Coumadin as directed.  4. Multivitamin.  5. Bisoprolol 2.5 mg p.o. daily.   PHYSICAL EXAMINATION:  VITAL SIGNS:  Blood pressure 153/77 mmHg, heart  rate 74 beats per minute, the weight is 189 pounds.  NECK:  Normal carotid upstroke with no carotid bruits.  LUNGS:  Clear breath sounds bilaterally.  HEART:  Regular rate and rhythm with a 3/6 decrescendo murmur at the  right upper sternal border. This murmur is pan diastolic and more  noticeable with the patient leaning forward.  ABDOMEN:  Soft, nontender, no rebound tenderness or guarding, good bowel  sounds.  EXTREMITIES:  No cyanosis, clubbing or edema.  NEUROLOGIC:  The patient is alert and oriented, the rest nonfocal.   PROBLEM LIST:  1. Nonischemic cardiomyopathy with ejection fraction 35%.  2. History of aortic insufficiency felt to be mild by TEE, possibly      more severe by catheterization.  3. No right heart catheterization data currently available.  4. Murmur of aortic insufficiency appears mild by physical exam.  5. 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  6. Atrial fibrillation with rate control.  7. History of hypertension.   PLAN:  1. The patient's symptoms are concerning for worsening valvular      disease.  2. I have ordered an echocardiographic study to reassess the patient's      aortic insufficiency and mitral regurgitation. The aortic      insufficiency still sounds mild by physical examination however.  3. If the patient has more severe aortic insufficiency, certainly       bisoprolol could be exacerbating his symptoms. I have discussed      with the patient's wife.  4. The patient could also have developed significant bradycardia on      bisoprolol therapy contributing to decreased exercise tolerance.  5. The patient will have laboratory work done by Dr. Criss Becker which we      have asked to fax over to our office.  6. Will see the patient back in a week and review his      electrocardiographic study. If there is any suggestion of more      severe aortic insufficiency, the patient will likely need a repeat      transesophageal echocardiographic study.     [name], MD,FACC  Electronically Signed    GED/MedQ  DD: 09/13/2006  DT: 09/13/2006  Job #: [id number]   cc:   Dr. Rosanna Becker, [place]

## 2011-01-06 DIAGNOSIS — I359 Nonrheumatic aortic valve disorder, unspecified: Secondary | ICD-10-CM

## 2011-01-07 DIAGNOSIS — I339 Acute and subacute endocarditis, unspecified: Secondary | ICD-10-CM

## 2011-01-08 DIAGNOSIS — I339 Acute and subacute endocarditis, unspecified: Secondary | ICD-10-CM

## 2011-01-09 ENCOUNTER — Inpatient Hospital Stay (HOSPITAL_COMMUNITY)
Admission: AD | Admit: 2011-01-09 | Discharge: 2011-01-20 | DRG: 260 | Disposition: A | Discharge status: Skilled Nursing Facility | Payer: Medicare Other | Source: Other Acute Inpatient Hospital | Attending: Cardiovascular Disease | Admitting: Cardiovascular Disease

## 2011-01-09 ENCOUNTER — Encounter: Payer: Self-pay | Admitting: Cardiology

## 2011-01-09 DIAGNOSIS — Z7982 Long term (current) use of aspirin: Secondary | ICD-10-CM

## 2011-01-09 DIAGNOSIS — R7881 Bacteremia: Secondary | ICD-10-CM | POA: Diagnosis present

## 2011-01-09 DIAGNOSIS — N400 Enlarged prostate without lower urinary tract symptoms: Secondary | ICD-10-CM | POA: Diagnosis present

## 2011-01-09 DIAGNOSIS — J189 Pneumonia, unspecified organism: Secondary | ICD-10-CM | POA: Diagnosis present

## 2011-01-09 DIAGNOSIS — I10 Essential (primary) hypertension: Secondary | ICD-10-CM | POA: Diagnosis present

## 2011-01-09 DIAGNOSIS — I5033 Acute on chronic diastolic (congestive) heart failure: Secondary | ICD-10-CM | POA: Diagnosis present

## 2011-01-09 DIAGNOSIS — G4733 Obstructive sleep apnea (adult) (pediatric): Secondary | ICD-10-CM | POA: Diagnosis present

## 2011-01-09 DIAGNOSIS — I2510 Atherosclerotic heart disease of native coronary artery without angina pectoris: Secondary | ICD-10-CM | POA: Diagnosis present

## 2011-01-09 DIAGNOSIS — Z951 Presence of aortocoronary bypass graft: Secondary | ICD-10-CM

## 2011-01-09 DIAGNOSIS — I498 Other specified cardiac arrhythmias: Secondary | ICD-10-CM | POA: Diagnosis present

## 2011-01-09 DIAGNOSIS — Z954 Presence of other heart-valve replacement: Secondary | ICD-10-CM

## 2011-01-09 DIAGNOSIS — B952 Enterococcus as the cause of diseases classified elsewhere: Secondary | ICD-10-CM | POA: Diagnosis present

## 2011-01-09 DIAGNOSIS — Z7901 Long term (current) use of anticoagulants: Secondary | ICD-10-CM

## 2011-01-09 DIAGNOSIS — Z96649 Presence of unspecified artificial hip joint: Secondary | ICD-10-CM

## 2011-01-09 DIAGNOSIS — I959 Hypotension, unspecified: Secondary | ICD-10-CM | POA: Diagnosis present

## 2011-01-09 DIAGNOSIS — Z950 Presence of cardiac pacemaker: Secondary | ICD-10-CM

## 2011-01-09 DIAGNOSIS — I4891 Unspecified atrial fibrillation: Secondary | ICD-10-CM | POA: Diagnosis present

## 2011-01-09 DIAGNOSIS — D649 Anemia, unspecified: Secondary | ICD-10-CM | POA: Diagnosis present

## 2011-01-09 DIAGNOSIS — R5381 Other malaise: Secondary | ICD-10-CM | POA: Diagnosis present

## 2011-01-09 DIAGNOSIS — I6529 Occlusion and stenosis of unspecified carotid artery: Secondary | ICD-10-CM | POA: Diagnosis present

## 2011-01-09 DIAGNOSIS — E876 Hypokalemia: Secondary | ICD-10-CM | POA: Diagnosis not present

## 2011-01-09 DIAGNOSIS — I2589 Other forms of chronic ischemic heart disease: Secondary | ICD-10-CM | POA: Diagnosis present

## 2011-01-09 DIAGNOSIS — I330 Acute and subacute infective endocarditis: Principal | ICD-10-CM | POA: Diagnosis present

## 2011-01-09 DIAGNOSIS — I509 Heart failure, unspecified: Secondary | ICD-10-CM | POA: Diagnosis present

## 2011-01-09 DIAGNOSIS — I428 Other cardiomyopathies: Secondary | ICD-10-CM | POA: Diagnosis present

## 2011-01-09 LAB — MRSA PCR SCREENING: MRSA by PCR: NEGATIVE

## 2011-01-09 LAB — PROTIME-INR
INR: 2.47 — ABNORMAL HIGH (ref 0.00–1.49)
Prothrombin Time: 27.2 seconds — ABNORMAL HIGH (ref 11.6–15.2)

## 2011-01-10 ENCOUNTER — Inpatient Hospital Stay (HOSPITAL_COMMUNITY): Payer: Medicare Other

## 2011-01-10 DIAGNOSIS — R7881 Bacteremia: Secondary | ICD-10-CM

## 2011-01-10 DIAGNOSIS — R0902 Hypoxemia: Secondary | ICD-10-CM

## 2011-01-10 DIAGNOSIS — B952 Enterococcus as the cause of diseases classified elsewhere: Secondary | ICD-10-CM

## 2011-01-10 DIAGNOSIS — I38 Endocarditis, valve unspecified: Secondary | ICD-10-CM

## 2011-01-10 LAB — BASIC METABOLIC PANEL
BUN: 15 mg/dL (ref 6–23)
BUN: 16 mg/dL (ref 6–23)
CO2: 34 mEq/L — ABNORMAL HIGH (ref 19–32)
CO2: 33 mEq/L — ABNORMAL HIGH (ref 19–32)
Calcium: 9 mg/dL (ref 8.4–10.5)
Calcium: 8.7 mg/dL (ref 8.4–10.5)
Chloride: 100 mEq/L (ref 96–112)
Chloride: 99 mEq/L (ref 96–112)
Creatinine, Ser: 1.02 mg/dL (ref 0.50–1.35)
Creatinine, Ser: 0.9 mg/dL (ref 0.50–1.35)
GFR calc Af Amer: >60 mL/min (ref >60)
GFR calc Af Amer: >60 mL/min (ref >60)
GFR calc non Af Amer: >60 mL/min (ref >60)
GFR calc non Af Amer: >60 mL/min (ref >60)
Glucose, Bld: 145 mg/dL — ABNORMAL HIGH (ref 70–99)
Glucose, Bld: 104 mg/dL — ABNORMAL HIGH (ref 70–99)
Potassium: 4.1 mEq/L (ref 3.5–5.1)
Potassium: 3 mEq/L — ABNORMAL LOW (ref 3.5–5.1)
Sodium: 138 mEq/L (ref 135–145)
Sodium: 139 mEq/L (ref 135–145)

## 2011-01-10 LAB — STREP PNEUMONIAE URINARY ANTIGEN: Strep Pneumo Urinary Antigen: NEGATIVE

## 2011-01-10 LAB — APTT: aPTT: 66 seconds — ABNORMAL HIGH (ref 24–37)

## 2011-01-10 LAB — PROTIME-INR
INR: 2.42 — ABNORMAL HIGH (ref 0.00–1.49)
Prothrombin Time: 26.7 seconds — ABNORMAL HIGH (ref 11.6–15.2)

## 2011-01-10 LAB — PRO B NATRIURETIC PEPTIDE: Pro B Natriuretic peptide (BNP): 3110 pg/mL — ABNORMAL HIGH (ref 0–450)

## 2011-01-11 ENCOUNTER — Inpatient Hospital Stay (HOSPITAL_COMMUNITY): Payer: Medicare Other

## 2011-01-11 LAB — GENTAMICIN LEVEL, TROUGH: Gentamicin Trough: 0.8 ug/mL (ref 0.5–2.0)

## 2011-01-11 LAB — BASIC METABOLIC PANEL
BUN: 15 mg/dL (ref 6–23)
BUN: 16 mg/dL (ref 6–23)
CO2: 33 mEq/L — ABNORMAL HIGH (ref 19–32)
CO2: 31 mEq/L (ref 19–32)
Calcium: 8.9 mg/dL (ref 8.4–10.5)
Calcium: 9 mg/dL (ref 8.4–10.5)
Chloride: 100 mEq/L (ref 96–112)
Chloride: 98 mEq/L (ref 96–112)
Creatinine, Ser: 0.92 mg/dL (ref 0.50–1.35)
Creatinine, Ser: 0.91 mg/dL (ref 0.50–1.35)
GFR calc Af Amer: >60 mL/min (ref >60)
GFR calc Af Amer: >60 mL/min (ref >60)
GFR calc non Af Amer: >60 mL/min (ref >60)
GFR calc non Af Amer: >60 mL/min (ref >60)
Glucose, Bld: 110 mg/dL — ABNORMAL HIGH (ref 70–99)
Glucose, Bld: 135 mg/dL — ABNORMAL HIGH (ref 70–99)
Potassium: 2.9 mEq/L — ABNORMAL LOW (ref 3.5–5.1)
Potassium: 3.3 mEq/L — ABNORMAL LOW (ref 3.5–5.1)
Sodium: 139 mEq/L (ref 135–145)
Sodium: 138 mEq/L (ref 135–145)

## 2011-01-11 LAB — CBC
HCT: 30.5 % — ABNORMAL LOW (ref 39.0–52.0)
Hemoglobin: 10.1 g/dL — ABNORMAL LOW (ref 13.0–17.0)
MCH: 27 pg (ref 26.0–34.0)
MCHC: 33.1 g/dL (ref 30.0–36.0)
MCV: 81.6 fL (ref 78.0–100.0)
Platelets: 141 10*3/uL — ABNORMAL LOW (ref 150–400)
RBC: 3.74 MIL/uL — ABNORMAL LOW (ref 4.22–5.81)
RDW: 17.2 % — ABNORMAL HIGH (ref 11.5–15.5)
WBC: 7.7 10*3/uL (ref 4.0–10.5)

## 2011-01-11 LAB — PROCALCITONIN: Procalcitonin: 0.3 ng/mL

## 2011-01-11 LAB — HIV ANTIBODY (ROUTINE TESTING W REFLEX): HIV: NONREACTIVE

## 2011-01-11 LAB — PROTIME-INR
INR: 2.71 — ABNORMAL HIGH (ref 0.00–1.49)
Prothrombin Time: 29.2 seconds — ABNORMAL HIGH (ref 11.6–15.2)

## 2011-01-11 LAB — SEDIMENTATION RATE: Sed Rate: 89 mm/hr — ABNORMAL HIGH (ref 0–16)

## 2011-01-11 LAB — PRO B NATRIURETIC PEPTIDE: Pro B Natriuretic peptide (BNP): 1884 pg/mL — ABNORMAL HIGH (ref 0–450)

## 2011-01-11 LAB — C-REACTIVE PROTEIN: CRP: 18.8 mg/dL — ABNORMAL HIGH (ref <0.6)

## 2011-01-12 ENCOUNTER — Inpatient Hospital Stay (HOSPITAL_COMMUNITY): Payer: Medicare Other

## 2011-01-12 DIAGNOSIS — J13 Pneumonia due to Streptococcus pneumoniae: Secondary | ICD-10-CM

## 2011-01-12 DIAGNOSIS — B952 Enterococcus as the cause of diseases classified elsewhere: Secondary | ICD-10-CM

## 2011-01-12 DIAGNOSIS — J810 Acute pulmonary edema: Secondary | ICD-10-CM

## 2011-01-12 DIAGNOSIS — I509 Heart failure, unspecified: Secondary | ICD-10-CM

## 2011-01-12 DIAGNOSIS — R7881 Bacteremia: Secondary | ICD-10-CM

## 2011-01-12 LAB — BASIC METABOLIC PANEL
BUN: 17 mg/dL (ref 6–23)
CO2: 32 mEq/L (ref 19–32)
Calcium: 8.8 mg/dL (ref 8.4–10.5)
Chloride: 102 mEq/L (ref 96–112)
Creatinine, Ser: 1.03 mg/dL (ref 0.50–1.35)
GFR calc Af Amer: >60 mL/min (ref >60)
GFR calc non Af Amer: >60 mL/min (ref >60)
Glucose, Bld: 105 mg/dL — ABNORMAL HIGH (ref 70–99)
Potassium: 3.8 mEq/L (ref 3.5–5.1)
Sodium: 139 mEq/L (ref 135–145)

## 2011-01-12 LAB — LEGIONELLA ANTIGEN, URINE: Legionella Antigen, Urine: NEGATIVE

## 2011-01-12 LAB — PROTIME-INR
INR: 2.7 — ABNORMAL HIGH (ref 0.00–1.49)
Prothrombin Time: 29.1 seconds — ABNORMAL HIGH (ref 11.6–15.2)

## 2011-01-13 ENCOUNTER — Inpatient Hospital Stay (HOSPITAL_COMMUNITY): Payer: Medicare Other

## 2011-01-13 LAB — BASIC METABOLIC PANEL
BUN: 21 mg/dL (ref 6–23)
CO2: 32 mEq/L (ref 19–32)
Calcium: 9 mg/dL (ref 8.4–10.5)
Chloride: 102 mEq/L (ref 96–112)
Creatinine, Ser: 0.99 mg/dL (ref 0.50–1.35)
GFR calc Af Amer: >60 mL/min (ref >60)
GFR calc non Af Amer: >60 mL/min (ref >60)
Glucose, Bld: 101 mg/dL — ABNORMAL HIGH (ref 70–99)
Potassium: 3.6 mEq/L (ref 3.5–5.1)
Sodium: 140 mEq/L (ref 135–145)

## 2011-01-13 LAB — PHOSPHORUS: Phosphorus: 3.6 mg/dL (ref 2.3–4.6)

## 2011-01-13 LAB — MAGNESIUM: Magnesium: 2.3 mg/dL (ref 1.5–2.5)

## 2011-01-13 LAB — VANCOMYCIN, TROUGH: Vancomycin Tr: 25.3 ug/mL (ref 10.0–20.0)

## 2011-01-14 DIAGNOSIS — T827XXA Infection and inflammatory reaction due to other cardiac and vascular devices, implants and grafts, initial encounter: Secondary | ICD-10-CM

## 2011-01-14 LAB — TYPE AND SCREEN
ABO/RH(D): A POS
Antibody Screen: NEGATIVE

## 2011-01-14 LAB — BASIC METABOLIC PANEL
BUN: 22 mg/dL (ref 6–23)
CO2: 31 mEq/L (ref 19–32)
Calcium: 8.9 mg/dL (ref 8.4–10.5)
Chloride: 101 mEq/L (ref 96–112)
Creatinine, Ser: 1.15 mg/dL (ref 0.50–1.35)
GFR calc Af Amer: >60 mL/min (ref >60)
GFR calc non Af Amer: >60 mL/min (ref >60)
Glucose, Bld: 101 mg/dL — ABNORMAL HIGH (ref 70–99)
Potassium: 3.1 mEq/L — ABNORMAL LOW (ref 3.5–5.1)
Sodium: 139 mEq/L (ref 135–145)

## 2011-01-14 LAB — CBC
HCT: 30.7 % — ABNORMAL LOW (ref 39.0–52.0)
Hemoglobin: 10.3 g/dL — ABNORMAL LOW (ref 13.0–17.0)
MCH: 27.5 pg (ref 26.0–34.0)
MCHC: 33.6 g/dL (ref 30.0–36.0)
MCV: 81.9 fL (ref 78.0–100.0)
Platelets: 223 10*3/uL (ref 150–400)
RBC: 3.75 MIL/uL — ABNORMAL LOW (ref 4.22–5.81)
RDW: 17.4 % — ABNORMAL HIGH (ref 11.5–15.5)
WBC: 8.8 10*3/uL (ref 4.0–10.5)

## 2011-01-14 LAB — PROTIME-INR
INR: 2.04 — ABNORMAL HIGH (ref 0.00–1.49)
Prothrombin Time: 23.4 seconds — ABNORMAL HIGH (ref 11.6–15.2)

## 2011-01-14 LAB — ABO/RH: ABO/RH(D): A POS

## 2011-01-15 ENCOUNTER — Inpatient Hospital Stay (HOSPITAL_COMMUNITY): Payer: Medicare Other

## 2011-01-15 DIAGNOSIS — T82198A Other mechanical complication of other cardiac electronic device, initial encounter: Secondary | ICD-10-CM

## 2011-01-15 LAB — BASIC METABOLIC PANEL
BUN: 19 mg/dL (ref 6–23)
CO2: 30 mEq/L (ref 19–32)
Calcium: 9 mg/dL (ref 8.4–10.5)
Chloride: 104 mEq/L (ref 96–112)
Creatinine, Ser: 1.11 mg/dL (ref 0.50–1.35)
GFR calc Af Amer: >60 mL/min (ref >60)
GFR calc non Af Amer: >60 mL/min (ref >60)
Glucose, Bld: 97 mg/dL (ref 70–99)
Potassium: 3.6 mEq/L (ref 3.5–5.1)
Sodium: 143 mEq/L (ref 135–145)

## 2011-01-15 LAB — DIFFERENTIAL
Basophils Absolute: 0.2 10*3/uL — ABNORMAL HIGH (ref 0.0–0.1)
Basophils Relative: 2 % — ABNORMAL HIGH (ref 0–1)
Eosinophils Absolute: 0.4 10*3/uL (ref 0.0–0.7)
Eosinophils Relative: 4 % (ref 0–5)
Lymphocytes Relative: 18 % (ref 12–46)
Lymphs Abs: 1.6 10*3/uL (ref 0.7–4.0)
Monocytes Absolute: 1 10*3/uL (ref 0.1–1.0)
Monocytes Relative: 11 % (ref 3–12)
Neutro Abs: 5.7 10*3/uL (ref 1.7–7.7)
Neutrophils Relative %: 65 % (ref 43–77)

## 2011-01-15 LAB — CBC
HCT: 30 % — ABNORMAL LOW (ref 39.0–52.0)
Hemoglobin: 10 g/dL — ABNORMAL LOW (ref 13.0–17.0)
MCH: 27.5 pg (ref 26.0–34.0)
MCHC: 33.3 g/dL (ref 30.0–36.0)
MCV: 82.4 fL (ref 78.0–100.0)
Platelets: 231 10*3/uL (ref 150–400)
RBC: 3.64 MIL/uL — ABNORMAL LOW (ref 4.22–5.81)
RDW: 17.6 % — ABNORMAL HIGH (ref 11.5–15.5)
WBC: 8.9 10*3/uL (ref 4.0–10.5)

## 2011-01-15 LAB — PROTIME-INR
INR: 1.7 — ABNORMAL HIGH (ref 0.00–1.49)
Prothrombin Time: 20.3 seconds — ABNORMAL HIGH (ref 11.6–15.2)

## 2011-01-16 ENCOUNTER — Inpatient Hospital Stay (HOSPITAL_COMMUNITY): Payer: Medicare Other

## 2011-01-16 LAB — CULTURE, BLOOD (ROUTINE X 2)
Culture  Setup Time: 201206161705
Culture  Setup Time: 201206161705
Culture: NO GROWTH
Culture: NO GROWTH

## 2011-01-16 LAB — BASIC METABOLIC PANEL
BUN: 14 mg/dL (ref 6–23)
CO2: 32 mEq/L (ref 19–32)
Calcium: 8.8 mg/dL (ref 8.4–10.5)
Chloride: 103 mEq/L (ref 96–112)
Creatinine, Ser: 1.09 mg/dL (ref 0.50–1.35)
GFR calc Af Amer: >60 mL/min (ref >60)
GFR calc non Af Amer: >60 mL/min (ref >60)
Glucose, Bld: 99 mg/dL (ref 70–99)
Potassium: 3.6 mEq/L (ref 3.5–5.1)
Sodium: 141 mEq/L (ref 135–145)

## 2011-01-16 LAB — CBC
HCT: 31.6 % — ABNORMAL LOW (ref 39.0–52.0)
Hemoglobin: 10.4 g/dL — ABNORMAL LOW (ref 13.0–17.0)
MCH: 27.3 pg (ref 26.0–34.0)
MCHC: 32.9 g/dL (ref 30.0–36.0)
MCV: 82.9 fL (ref 78.0–100.0)
Platelets: 269 10*3/uL (ref 150–400)
RBC: 3.81 MIL/uL — ABNORMAL LOW (ref 4.22–5.81)
RDW: 17.5 % — ABNORMAL HIGH (ref 11.5–15.5)
WBC: 9.4 10*3/uL (ref 4.0–10.5)

## 2011-01-16 LAB — DIFFERENTIAL
Basophils Absolute: 0.2 10*3/uL — ABNORMAL HIGH (ref 0.0–0.1)
Basophils Relative: 2 % — ABNORMAL HIGH (ref 0–1)
Eosinophils Absolute: 0.4 10*3/uL (ref 0.0–0.7)
Eosinophils Relative: 4 % (ref 0–5)
Lymphocytes Relative: 14 % (ref 12–46)
Lymphs Abs: 1.3 10*3/uL (ref 0.7–4.0)
Monocytes Absolute: 0.9 10*3/uL (ref 0.1–1.0)
Monocytes Relative: 10 % (ref 3–12)
Neutro Abs: 6.6 10*3/uL (ref 1.7–7.7)
Neutrophils Relative %: 71 % (ref 43–77)

## 2011-01-16 LAB — PROTIME-INR
INR: 1.77 — ABNORMAL HIGH (ref 0.00–1.49)
Prothrombin Time: 20.9 seconds — ABNORMAL HIGH (ref 11.6–15.2)

## 2011-01-17 LAB — BASIC METABOLIC PANEL
BUN: 15 mg/dL (ref 6–23)
CO2: 30 mEq/L (ref 19–32)
Calcium: 8.7 mg/dL (ref 8.4–10.5)
Chloride: 102 mEq/L (ref 96–112)
Creatinine, Ser: 1.06 mg/dL (ref 0.50–1.35)
GFR calc Af Amer: >60 mL/min (ref >60)
GFR calc non Af Amer: >60 mL/min (ref >60)
Glucose, Bld: 102 mg/dL — ABNORMAL HIGH (ref 70–99)
Potassium: 3.3 mEq/L — ABNORMAL LOW (ref 3.5–5.1)
Sodium: 140 mEq/L (ref 135–145)

## 2011-01-17 LAB — PROTIME-INR
INR: 1.76 — ABNORMAL HIGH (ref 0.00–1.49)
Prothrombin Time: 20.8 seconds — ABNORMAL HIGH (ref 11.6–15.2)

## 2011-01-17 LAB — DIFFERENTIAL
Basophils Absolute: 0.1 10*3/uL (ref 0.0–0.1)
Basophils Relative: 2 % — ABNORMAL HIGH (ref 0–1)
Eosinophils Absolute: 0.3 10*3/uL (ref 0.0–0.7)
Eosinophils Relative: 4 % (ref 0–5)
Lymphocytes Relative: 18 % (ref 12–46)
Lymphs Abs: 1.5 10*3/uL (ref 0.7–4.0)
Monocytes Absolute: 0.8 10*3/uL (ref 0.1–1.0)
Monocytes Relative: 10 % (ref 3–12)
Neutro Abs: 5.5 10*3/uL (ref 1.7–7.7)
Neutrophils Relative %: 67 % (ref 43–77)

## 2011-01-17 LAB — CBC
HCT: 29.3 % — ABNORMAL LOW (ref 39.0–52.0)
Hemoglobin: 9.9 g/dL — ABNORMAL LOW (ref 13.0–17.0)
MCH: 27.8 pg (ref 26.0–34.0)
MCHC: 33.8 g/dL (ref 30.0–36.0)
MCV: 82.3 fL (ref 78.0–100.0)
Platelets: 249 10*3/uL (ref 150–400)
RBC: 3.56 MIL/uL — ABNORMAL LOW (ref 4.22–5.81)
RDW: 17.2 % — ABNORMAL HIGH (ref 11.5–15.5)
WBC: 8.2 10*3/uL (ref 4.0–10.5)

## 2011-01-17 LAB — PRO B NATRIURETIC PEPTIDE: Pro B Natriuretic peptide (BNP): 817 pg/mL — ABNORMAL HIGH (ref 0–450)

## 2011-01-18 DIAGNOSIS — I471 Supraventricular tachycardia: Secondary | ICD-10-CM

## 2011-01-18 LAB — BASIC METABOLIC PANEL
BUN: 17 mg/dL (ref 6–23)
CO2: 30 mEq/L (ref 19–32)
Calcium: 8.7 mg/dL (ref 8.4–10.5)
Chloride: 104 mEq/L (ref 96–112)
Creatinine, Ser: 1.13 mg/dL (ref 0.50–1.35)
GFR calc Af Amer: >60 mL/min (ref >60)
GFR calc non Af Amer: >60 mL/min (ref >60)
Glucose, Bld: 99 mg/dL (ref 70–99)
Potassium: 4 mEq/L (ref 3.5–5.1)
Sodium: 140 mEq/L (ref 135–145)

## 2011-01-18 LAB — PROTIME-INR
INR: 1.86 — ABNORMAL HIGH (ref 0.00–1.49)
Prothrombin Time: 21.8 seconds — ABNORMAL HIGH (ref 11.6–15.2)

## 2011-01-18 LAB — PRO B NATRIURETIC PEPTIDE: Pro B Natriuretic peptide (BNP): 717.2 pg/mL — ABNORMAL HIGH (ref 0–450)

## 2011-01-19 DIAGNOSIS — I5033 Acute on chronic diastolic (congestive) heart failure: Secondary | ICD-10-CM

## 2011-01-19 LAB — CBC
HCT: 32.6 % — ABNORMAL LOW (ref 39.0–52.0)
Hemoglobin: 10.6 g/dL — ABNORMAL LOW (ref 13.0–17.0)
MCH: 27.2 pg (ref 26.0–34.0)
MCHC: 32.5 g/dL (ref 30.0–36.0)
MCV: 83.6 fL (ref 78.0–100.0)
Platelets: 280 10*3/uL (ref 150–400)
RBC: 3.9 MIL/uL — ABNORMAL LOW (ref 4.22–5.81)
RDW: 17.6 % — ABNORMAL HIGH (ref 11.5–15.5)
WBC: 8.4 10*3/uL (ref 4.0–10.5)

## 2011-01-19 LAB — BASIC METABOLIC PANEL
BUN: 14 mg/dL (ref 6–23)
CO2: 30 mEq/L (ref 19–32)
Calcium: 9.1 mg/dL (ref 8.4–10.5)
Chloride: 103 mEq/L (ref 96–112)
Creatinine, Ser: 1.11 mg/dL (ref 0.50–1.35)
GFR calc Af Amer: >60 mL/min (ref >60)
GFR calc non Af Amer: >60 mL/min (ref >60)
Glucose, Bld: 98 mg/dL (ref 70–99)
Potassium: 3.9 mEq/L (ref 3.5–5.1)
Sodium: 140 mEq/L (ref 135–145)

## 2011-01-19 LAB — PROTIME-INR
INR: 2.01 — ABNORMAL HIGH (ref 0.00–1.49)
Prothrombin Time: 23.1 seconds — ABNORMAL HIGH (ref 11.6–15.2)

## 2011-01-20 LAB — PROTIME-INR
INR: 2.14 — ABNORMAL HIGH (ref 0.00–1.49)
Prothrombin Time: 24.3 seconds — ABNORMAL HIGH (ref 11.6–15.2)

## 2011-01-20 LAB — GENTAMICIN LEVEL, TROUGH: Gentamicin Trough: 2.5 ug/mL (ref 0.5–2.0)

## 2011-01-29 ENCOUNTER — Ambulatory Visit (INDEPENDENT_AMBULATORY_CARE_PROVIDER_SITE_OTHER): Payer: Medicare Other | Admitting: *Deleted

## 2011-01-29 DIAGNOSIS — Z7901 Long term (current) use of anticoagulants: Secondary | ICD-10-CM

## 2011-01-29 DIAGNOSIS — I4891 Unspecified atrial fibrillation: Secondary | ICD-10-CM

## 2011-01-29 LAB — POCT INR: INR: 2.9

## 2011-01-29 NOTE — Discharge Summary (Addendum)
  NAME:  [name], MIMBS NO.:  [id number]  MEDICAL RECORD NO.:  [id number]  LOCATION:  3712                         FACILITY:  [facility]  PHYSICIAN:  Hillis Range, MD       DATE OF BIRTH:  [birth year]  DATE OF ADMISSION:  01/09/2011 DATE OF DISCHARGE:  01/20/2011                              DISCHARGE SUMMARY   Please note, two changes were made to the patient's medication reconciliation upon discharge.  He will be taking gentamicin 70 mg q.12 h., which has been adjusted per pharmacy as well his Coumadin has been changed to 3 mg daily.     Ronie Spies, P.A.C.   ______________________________ Hillis Range, MD    DD/MEDQ  D:  01/20/2011  T:  01/20/2011  Job:  [id number]  cc:   Hillis Range, MD Dr. Alta Corning  Electronically Signed by Ronie Spies  on 01/29/2011 01:31:32 PM Electronically Signed by Hillis Range MD on 02/16/2011 09:58:24 AM

## 2011-01-29 NOTE — Progress Notes (Signed)
Pt also presents for wound check following extraction of PTVP on January 15, 2011. Tegaderm removed. Unable to remove Tegaderm with removal of steri-strips. Pt's incision is completely closed without evidence of drainage or other signs/symptoms of infection. There is minimal bruising and a small amount of swelling remains at pocket site.   Pt has not complaints or problems at present. He is doing well at [facility] center. His wife asked that Dr. Johney Frame call Dr. Criss Alvine to discuss discharge plans. Pt's wife is aware a note will be sent to Dr. Johney Frame requesting that this call be made.

## 2011-02-12 NOTE — Consult Note (Signed)
NAME:  [name], [name] NO.:  [id number]  MEDICAL RECORD NO.:  [id number]  LOCATION:  3712                         FACILITY:  [facility]  PHYSICIAN:  Doylene Canning. Ladona Ridgel, MD    DATE OF BIRTH:  06-01-1929  DATE OF CONSULTATION:  01/12/2011 DATE OF DISCHARGE:                                CONSULTATION   Consultation is requested by Dr. Ninetta Lights.  INDICATION FOR CONSULTATION:  Evaluation for possible pacemaker lead extraction in the setting of enterococcus faecalis bacteremia and pneumonia.  HISTORY OF PRESENT ILLNESS:  The patient is a 77 year old man who has a history of valvular heart disease and is status post aortic valve replacement and mitral valve repair.  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  He was in his usual state of health until several days prior to admission when he began to experience fevers, chills, and presented to the hospital profoundly weak.  Blood cultures grew out Enterococcus faecalis and he was transferred here for additional evaluation.  Transesophageal echo demonstrated no obvious vegetations on either his bowels or on his pacing leads.  He is referred for additional evaluation.  He denies a history of syncope.  He has a history of recurrent atrial arrhythmias, which has been treated with medical therapy.  He has a history of atrial fibrillation status post Cox maze procedure.  He has a history of hypertension.  FAMILY HISTORY:  Negative for premature coronary artery disease.  SOCIAL HISTORY:  The patient is married.  He retired.  He denies tobacco or ethanol abuse.  REVIEW OF SYSTEMS:  All systems reviewed and negative except as noted in the HPI.  He does have chronic class II heart failure symptoms.  PHYSICAL EXAMINATION:  GENERAL:  He is a pleasant 77 year old man in no distress. VITAL SIGNS:  Blood pressure was 115/65, pulse was 70 and regular, respirations were 18, temperature  98. HEENT:  Normocephalic and atraumatic.  Pupils are equal and round. Oropharynx moist.  Sclerae anicteric. NECK:  No jugular venous distention. LUNGS:  Rales in the bases bilaterally.  No egophony was present.  No wheezes or rhonchi. CARDIOVASCULAR:  Regular rate and rhythm.  Normal S1 and S2.  There was systolic ejection murmur present.  PMI was enlarged laterally displaced. ABDOMEN:  Soft, nontender.  There is no organomegaly. EXTREMITIES:  No cyanosis, clubbing, or edema.  Pulses are 2+ and symmetric.  LABORATORY DATA:  Notable for hypokalemia with potassium in the 3 range. The chest x-ray is notable for patchy right upper and right lower infiltrates and confluent air space disease and cardiomegaly.  IMPRESSION: 1. Enterococcus bacteremia/septicemia/pneumonia in the setting of an     indwelling permanent pacemaker. 2. Status post aortic, mitral valve repair/replacement.  DISCUSSION:  The patient has an indication for pacemaker leads and system extraction.  I discussed the risks and benefits with the patient. It would be highly unlikely that his infection could be cured permanently without removal of his pacing hardware.  The main question is whether or not his valves are in fact contaminated and if so whether they can be cured, but in addition to  removal of his pacing system, he will require long-term antibiotics.  We will follow the patient once his device is removed to determine whether or not he will need a temporary permanent pacemaker to be inserted prior to discharge home.  We will also plan to defer the decision about permanent pacing once his device is removed based on his clinical course.     Doylene Canning. Ladona Ridgel, MD     GWT/MEDQ  D:  01/16/2011  T:  01/16/2011  Job:  [id number]  cc:   [name], MD,FACC Hillis Range, MD  Electronically Signed by Lewayne Bunting MD on 02/12/2011 08:35:03 AM

## 2011-02-16 ENCOUNTER — Encounter: Payer: Self-pay | Admitting: Internal Medicine

## 2011-02-16 NOTE — Discharge Summary (Signed)
NAME:  [name], [name] NO.:  [id number]  MEDICAL RECORD NO.:  [id number]  LOCATION:  3712                         FACILITY:  [facility]  PHYSICIAN:  Hillis Range, MD       DATE OF BIRTH:  1929-01-15  DATE OF ADMISSION:  01/09/2011 DATE OF DISCHARGE:  01/20/2011                              DISCHARGE SUMMARY   PROCEDURES: 1. Fluoroscopy for peripherally inserted central catheter line     placement. 2. Serial chest x-rays to follow up heart failure. 3. Extraction of a permanent pacemaker system.  PRIMARY FINAL DISCHARGE DIAGNOSES: 1. Enterococcus faecalis bacteremia. 2. Acute-on-chronic diastolic congestive heart failure.  SECONDARY DIAGNOSES: 1. Status post two-vessel bypass surgery as well as bioprosthetic     aortic valve replacement and mitral valve repair with a cox maze     procedure in 2008 at [facility]. 2. History of endocarditis prior to bypass surgery. 3. Chronic atrial fibrillation. 4. History of atrial flutter. 5. History of bradycardia and posttermination pauses requiring a     [employer]tronic dual-lead pacemaker. 6. Chronic anticoagulation with Coumadin. 7. History of nonobstructive carotid artery disease. 8. Hypertension. 9. Benign prostatic hypertrophy. 10.Status post left total hip replacement and left hip/thigh effusion     remotely. 11.History of obstructive sleep apnea, on continuous positive airway     pressure. 12.Mixed ischemic and nonischemic cardiomyopathy. 13.Possible pneumonia at [facility].  TIME AT DISCHARGE:  67 minutes.  HOSPITAL COURSE:  [name] is a 78 year old male with a history of coronary artery disease and valvular heart disease.  He went to [facility] on January 06, 2011, with fevers and chills.  Blood cultures were positive, and he was started on antibiotics.  A 2-D echocardiogram was performed and he was also felt to have pneumonia with an abnormal chest x-ray.  It was felt that he  should be transferred to [facility] for further evaluation and treatment and this was done on January 09, 2011.  [name] was felt to have congestive heart failure and was diuresed with his weight decreasing by 4 kg during his hospital stay.  His O2 saturation improved with this but he may still need oxygen at discharge. His chest x-ray improved significantly with diuresis.  His renal function was followed closely with diuresis and he required some potassium supplementation, but his BUN and creatinine remained stable. Prior to discharge, his BNP was down to 717.  He was anemic with a hemoglobin of 10.6 and hematocrit of 32.6.  His MCV was within normal limits and this is to be followed as an outpatient.  His Coumadin level was followed by pharmacy and was therapeutic at discharge.  Blood cultures came back with Enterococcus faecalis, and Infectious Disease was consulted for recommendations on antibiotic therapy and management of this.  Dr. Ninetta Lights recommended removing of the permanent pacemaker system as the most likely source of the bacteremia.  He recommended a 3- week course of antibiotics and stated the best options for this patient are ampicillin and gentamicin.  Because of the frequency of administration, the patient is not able to return home with home health assistance.  Skilled nursing facility is therefore  indicated.  The patient also had problems with deconditioning and will need physical therapy and occupational therapy as well.  As the patient's respiratory status improved, he was scheduled for extraction of the pacemaker system.  This was performed on January 15, 2011.  Overnight, his heart rate dropped into the 30s and he had some problems with junctional bradycardia.  His heart rate was frequently dropping into the 40s.  He is asymptomatic with this.  Dr. Ladona Ridgel and Dr. Johney Frame were in agreement that he should be followed closely but the best option is to avoid pacemaker  reimplantation at this time.  The longer pauses were happening while he was asleep and he was asymptomatic.  He was felt to have sick sinus syndrome, but is currently asymptomatic.  Dr. Johney Frame recommended continuing amiodarone and low-dose Coreg.  Dr. Johney Frame feels repeat pacing may be inevitable but recommends avoiding it if possible until antibiotics are complete.  On January 18, 2011, his Foley was discontinued. He was voiding well without it.  He had some problems with hypotension, but as his condition improved, his blood pressure improved.  As he improved, his blood pressure medications and heart failure medications were resumed.  As his volume status has improved, he will be reassessed for hypoxia prior to discharge to see if home O2 was required.  Because of his physical therapy and IV antibiotic needs, short-term skilled nursing facility is being pursued.  By January 19, 2011, Dr. Johney Frame felt that [name] was stable for discharge to a skilled nursing facility when a bed became available.  DISCHARGE INSTRUCTIONS: 1. His activity level is to be increased per Physical and Occupational     Therapy. 2. He is to be on a 2-g sodium fat modified diet.  He is to get daily     weights. 3. His incision has Steri-Strips and it is to be kept dry until January 23, 2011.  Signs of infection such as drainage are to be reported.     After January 23, 2011, he can shower.  He is to follow up with Dr.     Johney Frame on February 19, 2011, at 9:30.  He is to follow up with a wound     check in the [facility] which will be set up prior to discharge.     He is to follow up with Dr. Andee Lineman and Dr. Alta Corning as needed.  DISCHARGE MEDICATIONS: 1. Gentamicin 40 mg/mL, 70 mg IV q.8 h. for 17 more days. 2. Benazepril 40 mg daily. 3. Amiodarone 100 mg daily. 4. Coreg 3.125 mg b.i.d. 5. Colace 100 mg b.i.d. 6. Psyllium fiber pack daily. 7. Senokot daily p.r.n. 8. Amlodipine 10 mg daily. 9. Ampicillin 500 mg in 2 mL,  3000 mg IV q.6 h. for 17 more days. 10.Ditropan 5 mg nightly. 11.Pravachol 40 mg a day. 12.Lasix 40 mg p.o. b.i.d. 13.Multivitamins daily. 14.Sublingual nitroglycerin q.5 minutes x3 p.r.n. 15.Aspirin 81 mg a day. 16.Protonix 40 mg a day. 17.Potassium 20 mEq b.i.d. 18.Coumadin 2 mg p.o. nightly or as directed.     Theodore Demark, PA-C   ______________________________ Hillis Range, MD    RB/MEDQ  D:  01/19/2011  T:  01/19/2011  Job:  [id number]  cc:   Dr. Alta Corning  Electronically Signed by Theodore Demark PA-C on 02/10/2011 04:00:06 PM Electronically Signed by Hillis Range MD on 02/16/2011 09:58:26 AM

## 2011-02-18 NOTE — Consult Note (Signed)
NAME:  [name], [name] NO.:  [id number]  MEDICAL RECORD NO.:  [id number]  LOCATION:  2927                         FACILITY:  [facility]  PHYSICIAN:  Acey Lav, MD  DATE OF BIRTH:  1928/08/24  DATE OF CONSULTATION:  01/10/2011 DATE OF DISCHARGE:                                CONSULTATION   REQUESTING PHYSICIAN:  Marca Ancona, MD  REASON FOR INFECTIOUS DISEASE CONSULTATION:  The patient with enterococcus faecalis bacteremia in the setting of bioprosthetic aortic valve and pacemaker, also with what appears to be healthcare-associated ammonia.  HISTORY OF PRESENT ILLNESS:  [name] is a 77 year old Caucasian male with a past medical history significant for coronary artery disease status post coronary artery bypass grafting, also with history of cardiomyopathy and valvular heart disease who was diagnosed with viridans group streptococcal endocarditis just prior to replacement of his aortic valve and repair of his mitral valve.  He was diagnosed shortly before they were planning to treat his valvular heart disease and he was treated with antibiotics per the family effectively prior to repair of the mitral valve and replacement of the aortic valve in September 2008 at [facility].  The patient had been feeling in his usual state of health up until Monday, June 11 when he became acutely febrile with shaking chills and profoundly weak.  At that time, he had no other symptoms of cough or abdominal pain.  He had no increased urination at night or difficulty emptying his bladder.  He did not have nausea, but he was simply profoundly fatigued and febrile.  He was so weak that he was unable to get up and walk and EMS was called and he was transported to [facility].  Upon arrival at [facility], there was concern for pneumonia based on a right-sided infiltrate in the medial lobe and he was placed on levofloxacin.  Blood cultures were obtained and  have subsequently grown ampicillin-sensitive enterococcus.  The patient has been changed in the interim to ampicillin and gentamicin, along with continuation of his levofloxacin.  He underwent transesophageal echocardiogram at [facility] by Dr. Andee Lineman and this failed to show any vegetations or involvement of the pacemaker lead on TEE.  He had progressive respiratory problems with increasing oxygen requirements and has been transferred to [facility] to the 2900 unit for further care.  We are asked to see the patient to assist in management and work up this patient with bacteremia, concern for prosthetic valve infection, pacemaker infection, and now also for healthcare-associated pneumonia. Currently, the patient is on face mask.  His chest x-ray here does show an extensive right-sided infiltrate concerning for possible healthcare- associated pneumonia.  PAST MEDICAL HISTORY: 1. Multivessel coronary artery disease. 2. Ischemic and nonischemic cardiomyopathy. 3. Mixed valvular heart disease.  Bioprosthetic heart valve     replacement in 2008, mitral valve repair in 2008 at [facility],     endocarditis Streptococcus viridans group treated prior to     replacement of his valves. 4. Two-vessel coronary bypass grafting in 2008 at [facility]. 5. History of atrial fibrillation status post Cox maze procedure. 6. Atrial flutter, on  amiodarone. 7. Carotid artery disease without obstructive pathology. 8. Dual-chamber pacemaker in 2009 placed due to symptomatic     bradycardia. 9. Hypertension. 10.Benign prostatic hypertrophy. 11.Left hip and thigh fusion at the age of 77, left-sided hip     replacement.  SOCIAL HISTORY:  The patient lives in [place], [place] with wife and 2 sons.  No tobacco, no alcohol use.  FAMILY HISTORY:  Negative for coronary artery disease, otherwise unremarkable.  REVIEW OF SYSTEMS:  Described in history of present illness, otherwise 12-point  review of systems is negative.  ALLERGIES:  No known drug allergies.  CURRENT MEDICATIONS:  Levaquin, gentamicin, ampicillin, Pravachol, Norvasc, amiodarone, Lotensin, Coreg, aspirin, Lasix, K-Dur, potassium, Coumadin, and Ditropan.  PHYSICAL EXAMINATION:  VITAL SIGNS:  Temperature maximum since admission to [facility] is 99.4, temperature current 98.8; blood pressure 112/64; heart rate 75, sinus rhythm; saturating 89% on 50% face mask. GENERAL:  A pleasant gentleman in no acute distress. HEENT:  Normocephalic and atraumatic.  His pupils are equal, round, and reactive to light.  His sclerae are anicteric.  His conjunctivae were clear.  The oropharynx is clear without petechiae, erythema, or exudate. NECK:  Supple without significant lymphadenopathy. CARDIOVASCULAR:  Regular rate and rhythm with a loud 3/6 systolic murmur heard most prominently at the PMI. LUNGS:  Diminished breath sounds on the right side. ABDOMEN:  Distended, but minimally tender to palpation.  No evidence of rebound.  Positive bowel sounds. EXTREMITIES:  Trace edema. NEUROLOGIC:  Nonfocal. SKIN:  No evidence of petechiae or Janeway or splinter lesions.  No evidence of Osler nodes.  LABORATORY DATA:  Chest x-ray done at [facility] shows extensive right-sided upper and lower airspace disease, also possible left lower lobe infiltrate.  Beta natriuretic peptide was 3110.  His metabolic panel shows a sodium of 138, potassium 4.1, chloride 100, bicarb 34, BUN and creatinine 15 and 1.02, and glucose 145.  Protime was 26.7 and INR 2.42. MRSA by PCR screening was negative.  Blood cultures done on June 12 at [facility] were positive for Enterococcus faecalis sensitive to ampicillin.  Of note, I cannot see this actual cultures as they do not come with the patient, but this is per the notes that accompanied the patient.  IMPRESSION AND RECOMMENDATIONS:  This is a 77 year old gentleman with history of prosthetic aortic valve  replacement, pacemaker placement as well, now found to be with enterococcal bacteremia and concern for healthcare-associated pneumonia. 1. Enterococcal bacteremia:  Despite the patient's clean TEE, I would     be concerned for pacemaker infection and prosthetic valve     infection.  My recommendation would be to remove the pacemaker and     to treat him for prosthetic valve infection with high-dose     ampicillin and gentamicin provided his kidneys can tolerate the     gentamicin.  I would then do surveillance cultures to make sure he     has cleared the infection and then replace the pacemaker.  For now,     however, I am going to broaden his antibiotics to Zosyn,     vancomycin, and gentamicin.  See below discussion.  The source of     this could be due to prostatitis, bladder infection potentially,     although I do not believe anything has been isolated from urine.     Additionally, he could have an intra-abdominal source.  I think it     might be prudent to get a CT scan of his abdomen, although I  am     going to hold off doing so at this point in time as we are in the     midst of trying to resolve his pulmonary process.  There is no     evidence that he has anything going on in the abdomen at this point     in time. 2. Healthcare-associated pneumonia.  We will broaden him out to     vancomycin for coverage for methicillin-resistant staph aureus as     well as Zosyn which will cover his enterococcus nicely but also     cover Pseudomonas.  We will also continue gentamicin, but actually     using more doses for synergy for endocarditis for the time being.     I will check a pneumococcal antigen in the urine and a Legionella     antigen, although I am skeptical of Legionella infection.  I will     discontinue his levofloxacin.  I will check an HIV antibody.  Thank you for this infectious disease consultation.  We will follow along closely.     Acey Lav,  MD     CV/MEDQ  D:  01/10/2011  T:  01/10/2011  Job:  [id number]  cc:   Leslye Peer, MD  Electronically Signed by Paulette Blanch DAM MD on 02/18/2011 05:46:36 PM

## 2011-02-19 ENCOUNTER — Other Ambulatory Visit: Payer: Self-pay | Admitting: Internal Medicine

## 2011-02-19 ENCOUNTER — Other Ambulatory Visit (INDEPENDENT_AMBULATORY_CARE_PROVIDER_SITE_OTHER): Payer: Medicare Other | Admitting: *Deleted

## 2011-02-19 ENCOUNTER — Telehealth: Payer: Self-pay | Admitting: *Deleted

## 2011-02-19 ENCOUNTER — Encounter: Payer: Self-pay | Admitting: Internal Medicine

## 2011-02-19 ENCOUNTER — Ambulatory Visit (INDEPENDENT_AMBULATORY_CARE_PROVIDER_SITE_OTHER): Payer: Medicare Other | Admitting: Internal Medicine

## 2011-02-19 DIAGNOSIS — I4891 Unspecified atrial fibrillation: Secondary | ICD-10-CM

## 2011-02-19 DIAGNOSIS — I359 Nonrheumatic aortic valve disorder, unspecified: Secondary | ICD-10-CM

## 2011-02-19 DIAGNOSIS — I498 Other specified cardiac arrhythmias: Secondary | ICD-10-CM

## 2011-02-19 DIAGNOSIS — Z954 Presence of other heart-valve replacement: Secondary | ICD-10-CM

## 2011-02-19 NOTE — Patient Instructions (Signed)
Follow up as scheduled with Dr. Johney Frame and Dr. Earnestine Leys. Your physician has requested that you have an echocardiogram. Echocardiography is a painless test that uses sound waves to create images of your heart. It provides your doctor with information about the size and shape of your heart and how well your heart's chambers and valves are working. This procedure takes approximately one hour. There are no restrictions for this procedure. Your physician recommends that you continue on your current medications as directed. Please refer to the Current Medication list given to you today.

## 2011-02-19 NOTE — Assessment & Plan Note (Addendum)
Stable with minimal symptoms We will continue to follow,  I would like to defer PPM implant as long as possible to minimize risks for re-infection.  He is instructed not to drive until I see him again in 6 weeks.

## 2011-02-19 NOTE — Telephone Encounter (Signed)
2 D ECHO FOR 02-19-2011 IN OFFICE Checking percert

## 2011-02-19 NOTE — Telephone Encounter (Signed)
No precert required 

## 2011-02-19 NOTE — Progress Notes (Signed)
The patient presents today for routine electrophysiology followup.  Since his recent hospital discharge, the patient reports doing very well. He has had only 1 brief epiosode of dizziness several days ago lasting only several seconds.  Today, he denies symptoms of fevers, chills, palpitations, chest pain, shortness of breath, orthopnea, PND, lower extremity edema, presyncope, syncope, or neurologic sequela.   He is now off of antibiotics.  His initial blood cultures were negative.  The patient feels that he is tolerating medications without difficulties and is otherwise without complaint today.   Past Medical History  Diagnosis Date  . Symptomatic bradycardia   . Atrial flutter     Recurrent, left side. Amiodarone therapy, coumadin therapy, prior history of cardioversion and overdrive pacing; maintenance on normal sinus rhythm  . Cardiomyopathy     Mixed ischemic and nonischemic with previous EF of 35-40%, most recently in June 2009 normalized to 55-60%. Echo 2/11 EF 55-60%  . CAD (coronary artery disease)   . Valvular heart disease   . Endocarditis     s/p PPM extraction by Dr Ladona Ridgel 2012  . Atrial fibrillation   . History of Doppler echocardiogram     Less than 50% bilateral internal carotid artery stenosis by Doppler 2009  . Hypertension   . Benign prostatic hyperplasia   . Carpal tunnel syndrome   . Carotid artery stenosis    Past Surgical History  Procedure Date  . Coronary artery bypass graft   . Total hip arthroplasty   . Knee arthroscopy   . Carpal tunnel release   . Pacemaker insertion     initially implanted at [facility],  extracted by Dr Ladona Ridgel for endocarditisi  . Aortic valve replacement     Current Outpatient Prescriptions  Medication Sig Dispense Refill  . amiodarone (PACERONE) 200 MG tablet Take 100 mg by mouth daily.        Marland Kitchen amLODipine (NORVASC) 10 MG tablet Take 10 mg by mouth daily.        Marland Kitchen aspirin 81 MG tablet Take 81 mg by mouth daily.        . benazepril  (LOTENSIN) 40 MG tablet Take 40 mg by mouth daily.        . carvedilol (COREG) 3.125 MG tablet Take 3.125 mg by mouth 2 (two) times daily with a meal.        . furosemide (LASIX) 40 MG tablet Take 80 mg by mouth 2 (two) times daily.        . Multiple Vitamins-Minerals (THEREMS M PO) Take 1 tablet by mouth daily.        . nitroGLYCERIN (NITROSTAT) 0.4 MG SL tablet Place 0.4 mg under the tongue every 5 (five) minutes as needed.        Marland Kitchen oxybutynin (DITROPAN) 5 MG tablet Take 5 mg by mouth at bedtime.        . potassium chloride SA (K-DUR,KLOR-CON) 20 MEQ tablet Take 20 mEq by mouth 2 (two) times daily.        . pravastatin (PRAVACHOL) 40 MG tablet Take 40 mg by mouth daily.        Marland Kitchen docusate sodium (COLACE) 100 MG capsule Take 100 mg by mouth 2 (two) times daily.        . pantoprazole (PROTONIX) 40 MG tablet Take 40 mg by mouth daily.        . psyllium (METAMUCIL) 58.6 % packet Take 1 packet by mouth daily.        Marland Kitchen senna (SENOKOT) 8.6  MG tablet Take 1 tablet by mouth daily as needed.        . torsemide (DEMADEX) 20 MG tablet Take 40 mg by mouth daily.        Marland Kitchen warfarin (COUMADIN) 2 MG tablet Take 1-2 tablets daily as directed          No Known Allergies  History   Social History  . Marital Status: Married    Spouse Name: N/A    Number of Children: N/A  . Years of Education: N/A   Occupational History  . Not on file.   Social History Main Topics  . Smoking status: Unknown If Ever Smoked  . Smokeless tobacco: Not on file  . Alcohol Use: No  . Drug Use: Not on file  . Sexually Active: Not on file   Other Topics Concern  . Not on file   Social History Narrative  . No narrative on file    Family History  Problem Relation Age of Onset  . Diabetes Neg Hx   . Hypertension Neg Hx   . Coronary artery disease Neg Hx     ROS-  All systems are reviewed and are negative except as outlined in the HPI above    Physical Exam: Filed Vitals:   02/19/11 0933  BP: 123/73  Pulse:  65  Resp: 18  Height: 5\' 7"  (1.702 m)  Weight: 180 lb 12.8 oz (82.01 kg)  SpO2: 94%    GEN- The patient is well appearing, alert and oriented x 3 today.   Head- normocephalic, atraumatic Eyes-  Sclera clear, conjunctiva pink Ears- hearing intact Oropharynx- clear Neck- supple, no JVP Lymph- no cervical lymphadenopathy L chest pacemaker extraction site is healing nicely,  There was a small retained stitch superficially which I removed today. Lungs- Clear to ausculation bilaterally, normal work of breathing Heart- Regular rate and rhythm, no murmurs, rubs or gallops, PMI not laterally displaced GI- soft, NT, ND, + BS Extremities- no clubbing, cyanosis, or edema MS- no significant deformity or atrophy Skin- no rash or lesion Psych- euthymic mood, full affect Neuro- strength and sensation are intact  ekg today reveals atrial flutter with variable V response, V rate 50 bpm  Assessment and Plan:

## 2011-02-19 NOTE — Assessment & Plan Note (Addendum)
Stable Repeat echo prior to follow-up with Dr Andee Lineman to assess valves with recent endocarditis  He also has ID follow-up with Dr Ninetta Lights in 2 weeks.

## 2011-02-19 NOTE — Assessment & Plan Note (Addendum)
He has a h/o afib and atrial flutter but is minimally symptomatic Continue coumadin

## 2011-02-28 ENCOUNTER — Emergency Department (HOSPITAL_COMMUNITY): Payer: Medicare Other

## 2011-02-28 ENCOUNTER — Inpatient Hospital Stay (HOSPITAL_COMMUNITY)
Admission: EM | Admit: 2011-02-28 | Discharge: 2011-03-03 | DRG: 312 | Disposition: A | Discharge status: Home / Self Care | Payer: Medicare Other | Attending: Cardiology | Admitting: Cardiology

## 2011-02-28 DIAGNOSIS — I428 Other cardiomyopathies: Secondary | ICD-10-CM | POA: Diagnosis present

## 2011-02-28 DIAGNOSIS — Z8249 Family history of ischemic heart disease and other diseases of the circulatory system: Secondary | ICD-10-CM

## 2011-02-28 DIAGNOSIS — R55 Syncope and collapse: Principal | ICD-10-CM | POA: Diagnosis present

## 2011-02-28 DIAGNOSIS — Z96649 Presence of unspecified artificial hip joint: Secondary | ICD-10-CM

## 2011-02-28 DIAGNOSIS — Z951 Presence of aortocoronary bypass graft: Secondary | ICD-10-CM

## 2011-02-28 DIAGNOSIS — I2589 Other forms of chronic ischemic heart disease: Secondary | ICD-10-CM | POA: Diagnosis present

## 2011-02-28 DIAGNOSIS — Z79899 Other long term (current) drug therapy: Secondary | ICD-10-CM

## 2011-02-28 DIAGNOSIS — I4892 Unspecified atrial flutter: Secondary | ICD-10-CM | POA: Diagnosis present

## 2011-02-28 DIAGNOSIS — Z7982 Long term (current) use of aspirin: Secondary | ICD-10-CM

## 2011-02-28 DIAGNOSIS — I2510 Atherosclerotic heart disease of native coronary artery without angina pectoris: Secondary | ICD-10-CM | POA: Diagnosis present

## 2011-02-28 DIAGNOSIS — I4891 Unspecified atrial fibrillation: Secondary | ICD-10-CM | POA: Diagnosis present

## 2011-02-28 DIAGNOSIS — Z952 Presence of prosthetic heart valve: Secondary | ICD-10-CM

## 2011-02-28 DIAGNOSIS — N400 Enlarged prostate without lower urinary tract symptoms: Secondary | ICD-10-CM | POA: Diagnosis present

## 2011-02-28 DIAGNOSIS — G4733 Obstructive sleep apnea (adult) (pediatric): Secondary | ICD-10-CM | POA: Diagnosis present

## 2011-02-28 DIAGNOSIS — I10 Essential (primary) hypertension: Secondary | ICD-10-CM | POA: Diagnosis present

## 2011-02-28 DIAGNOSIS — Z7901 Long term (current) use of anticoagulants: Secondary | ICD-10-CM

## 2011-02-28 DIAGNOSIS — E876 Hypokalemia: Secondary | ICD-10-CM | POA: Diagnosis not present

## 2011-02-28 LAB — BASIC METABOLIC PANEL
BUN: 23 mg/dL (ref 6–23)
CO2: 31 mEq/L (ref 19–32)
Calcium: 10.1 mg/dL (ref 8.4–10.5)
Chloride: 102 mEq/L (ref 96–112)
Creatinine, Ser: 1.13 mg/dL (ref 0.50–1.35)
GFR calc Af Amer: >60 mL/min (ref >60)
GFR calc non Af Amer: >60 mL/min (ref >60)
Glucose, Bld: 94 mg/dL (ref 70–99)
Potassium: 4.7 mEq/L (ref 3.5–5.1)
Sodium: 140 mEq/L (ref 135–145)

## 2011-02-28 LAB — CBC
HCT: 35.7 % — ABNORMAL LOW (ref 39.0–52.0)
Hemoglobin: 11.9 g/dL — ABNORMAL LOW (ref 13.0–17.0)
MCH: 27.3 pg (ref 26.0–34.0)
MCHC: 33.3 g/dL (ref 30.0–36.0)
MCV: 81.9 fL (ref 78.0–100.0)
Platelets: 154 10*3/uL (ref 150–400)
RBC: 4.36 MIL/uL (ref 4.22–5.81)
RDW: 16.1 % — ABNORMAL HIGH (ref 11.5–15.5)
WBC: 7.1 10*3/uL (ref 4.0–10.5)

## 2011-02-28 LAB — TROPONIN I: Troponin I: <0.3 ng/mL (ref <0.30)

## 2011-02-28 LAB — PROTIME-INR
INR: 2.7 — ABNORMAL HIGH (ref 0.00–1.49)
Prothrombin Time: 29.1 seconds — ABNORMAL HIGH (ref 11.6–15.2)

## 2011-03-01 DIAGNOSIS — R55 Syncope and collapse: Secondary | ICD-10-CM

## 2011-03-01 NOTE — H&P (Signed)
NAME:  [name], [name] NO.:  [id number]  MEDICAL RECORD NO.:  [id number]  LOCATION:  [facility]                         FACILITY:  [facility]  PHYSICIAN:  Seward Speck, MD        DATE OF BIRTH:  1928-08-29  DATE OF ADMISSION:  03/01/2011 DATE OF DISCHARGE:                             HISTORY & PHYSICAL   PRIMARY CARDIOLOGIST:  Hillis Range, MD  PRIMARY CARE PHYSICIAN:  Dr. Alta Corning, [place], [place].  CHIEF COMPLAINT:  Lightheadedness.  HISTORY OF PRESENT ILLNESS:  The patient is a 77 year old male with a significant cardiac history, status post CABG, status post bioprosthetic aortic valve replacement, status post mitral valve repair, status post permanent pacemaker implantation for history of bradycardia and post AFib termination pauses, who presents for the above complaint.  The patient was in the hospital in June 2012 with infective endocarditis. It was thought that his pacemaker could be the nidus of infection and thus it was explanted during that admission.  He completed his antibiotics in mid July.  Since that time has had no signs of recurrent endocarditis such as fevers, chills, decreased appetite, malaise, fatigue, etc.  On his blood work that he has been getting weekly per his wife locally suggests no recurrence of his infection.  His pacemaker was never reimplanted after the episode due to concerns that it could cause recurrence of his infection.  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  The patient has been doing well without his pacemaker implanted until yesterday on August 4 when at noon and at 7 p.m. he had identical episode.  He was sitting down and had 5-10 seconds spell of lightheadedness described as a funny feeling in his head where he felt like he may pass out.  During this spell he was conscious.  He knew what was going on and soon after the spell resolved, he felt normal.  He feels normal currently.  He was sitting during the  spell both times.  He did not have any head turning motions during the spell.  Do not feel like vertigo.  He has not had any hearing changes or tinnitus recently. He did not have any sort of headache.  He did not have any weakness, numbness, or tingling in any extremities.  He did not have any palpitations, chest pain, or shortness of breath.  He simply states it was a funny sensation where he felt like he may pass out.  He did nothing to make these resolve.  They went away on their own after simply sitting still for 5-10 seconds.  His wife took his blood pressure and pulse immediately after each episode.  His blood pressure was high with a systolic in the 160s.  She was unable to remember what his pulse was, but she states it was "not low."  Currently, he is asymptomatic.  He received no intervention in the emergency room.  The on-call cardiologist Marca Ancona for [facility] was contacted regarding the patient who stated he could either follow up in the clinic on Monday or be admitted.  The patient elected to be admitted for telemetry monitoring.PAST MEDICAL HISTORY: 1. CAD status post two-vessel  bypass surgery and bioprosthetic aortic     valve replacement and mitral valve repair with a maze procedure in     2008 at [facility]. 2. History of endocarditis prior to his bypass surgery. 3. 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 4. History of bradycardia and post AFib termination pauses requiring     implantation of a dual lead permanent pacemaker that was explanted     in June 2012 during his endocarditis spell and has not been     reimplanted.  Pacemaker implantation was 2009. 5. History of chronic atrial fibrillation, on Coumadin. 6. History of atrial flutter. 7. History of chronic anticoagulation. 8. History of nonobstructive coronary artery disease status post      bypass. 9. Hypertension. 10.BPH. 11.History of left hip replacement. 12.History of OSA on CPAP. 13.History of mixed ischemic and nonischemic cardiomyopathy.  FAMILY HISTORY:  Noncontributory.  SOCIAL HISTORY:  The patient lives with his wife.  He used to smoke but quit in the 1950s.  No recent alcohol use.  REVIEW OF SYSTEMS:  The 12-point review of systems was reviewed with the patient as HPI, otherwise negative.  His wife thinks that he may be a little more distended in the abdomen possibly due to fluid retention. He is currently 3 pounds above his dry weight.  ALLERGIES:  No known drug allergies.  MEDICATIONS:  Medication list confirmed with him and his wife.  Warfarin 3 mg once nightly, potassium 20 mEq once daily, Lotensin 40 mg once daily, amiodarone 100 mg daily, Coreg 3.125 twice daily, Metamucil one- pack once daily, oxybutynin 5 mg once daily, pravastatin 40 mg at night, furosemide 40 mg twice daily, amlodipine 5 mg daily, aspirin 81 mg every other day.  PHYSICAL EXAM:  VITAL SIGNS:  Afebrile, blood pressure 138/75, pulse 62, respiratory 12, oxygen saturation 99% on room air. GENERAL/CONSTITUTIONAL:  Well-developed elderly male in no acute distress. HEENT:  Moist mucous membranes. NECK:  Supple.  His jugular veins are distended and elevated to the mid neck sitting at 45 degrees. CARDIOVASCULAR:  Regular rate and rhythm.  Normal splitting of S2.  No S3.  No S4.  He has a 4/6 systolic murmur heard throughout the precordium, loudest at the left upper sternal border.  There is early peaking and radiates to the carotids and subclavians. PULMONARY:  Clear to auscultation bilaterally. ABDOMEN:  Soft, nontender.  His abdomen seems little bit protuberant. EXTREMITIES:  Warm, well perfused.  He has normal pulses.  He has trace bilateral ankle edema. PSYCHIATRIC:  Appropriate. NEUROLOGIC:  Alert and oriented x3.  Nonfocal.  Normal strength.  Normal motor and sensory exam in  both extremities.  EOMI.  No tongue deviation. SKIN:  No rash.  DIAGNOSTIC STUDIES:  An EKG rhythm strip was performed after his initial EKG which shows either an atrial tachycardia or slow atypical atrial flutter at a rate of approximately 180 with 3:1 block with a ventricular response rate of around 62.  Both his atrial rhythm and his ventricular rhythm are regular.  His PR interval was constant and prolonged after the P waves that do conduct in a 3:1 fashion as noted above.  QTc is mildly prolonged at 493.  There are no ischemic changes.  LABORATORY STUDIES:  CBC with a mild anemia, mildly reduced hemoglobin and INR of 2. 7 and BMP that is normal including a  negative troponin. His chest x-ray is unremarkable.  ASSESSMENTS: 1. Presyncopal spell. 2. Coronary artery disease status post coronary artery bypass graft. 3. Status post bioprosthetic aortic valve replacement. 4. Chronic atrial fibrillation. 5. Therapeutic anticoagulation. 6. Status post pacemaker explantation. 7. Status post recent endocarditis. 8. History of hypertension. 9. Atrial flutter vs. Atrial Tachycardia on current ECG.  PLAN:  It is difficulty to know what the spell was.  Additionally, he does say in the past he has had a few episodes where head turning elicited presyncopal spells.  This raises the possibility of carotid sinus hypersensitivity or even vertigo as explanation of her symptoms, although today's spells were not associated with head turning or movement.  He was given the option of being discharged with followup on Monday with cardiology or inpatient observation.  He elected for the latter.  Thus, we will place him on observation with telemetry to see if we can document any sort of bradycardia.  His wife took his pulse soon after these episodes and his pulse was per her not low.  This does not rule out a bradycardic episode at the time of the event.  His EKG do show some sort of atrial tachycardia or  slow atrial flutter with a 3:1 block with ventricular rate in the low 60s.  This in and of itself should not cause presyncope, although we cannot exclude a higher degree of heart block at the time of the event.  He certainly does not have any evidence of sinus bradycardia or sinus node dysfunction on his current EKGs.  This does not sound like a TIA or stroke and he is on therapeutic anticoagulation for AFib with an INR of 2.7 in the setting that is not likely explanation for his symptoms and needs no further workup at this time.  If he has no symptoms and his telemetry is stable, he can likely be discharged with some sort of event monitor with followup with cardiology.  It will probably be a good idea to touch base with the on- call physician for [facility] tomorrow regarding his followup as they were onto the impression that he was going to be likely discharged with followup scheduled for early next week.  Obviously should his condition change, we will reevaluate the need for additional evaluations.  We will continue all of his outpatient medicines unchanged here in the hospital.          ______________________________ Seward Speck, MD     DP/MEDQ  D:  03/01/2011  T:  03/01/2011  Job:  [id number]  Electronically Signed by Seward Speck MD on 03/01/2011 06:56:28 AM

## 2011-03-02 DIAGNOSIS — Y831 Surgical operation with implant of artificial internal device as the cause of abnormal reaction of the patient, or of later complication, without mention of misadventure at the time of the procedure: Secondary | ICD-10-CM

## 2011-03-02 DIAGNOSIS — R42 Dizziness and giddiness: Secondary | ICD-10-CM

## 2011-03-02 DIAGNOSIS — T827XXA Infection and inflammatory reaction due to other cardiac and vascular devices, implants and grafts, initial encounter: Secondary | ICD-10-CM

## 2011-03-02 LAB — PROTIME-INR
INR: 2.66 — ABNORMAL HIGH (ref 0.00–1.49)
Prothrombin Time: 28.8 seconds — ABNORMAL HIGH (ref 11.6–15.2)

## 2011-03-03 ENCOUNTER — Other Ambulatory Visit: Payer: Self-pay | Admitting: *Deleted

## 2011-03-03 DIAGNOSIS — R55 Syncope and collapse: Secondary | ICD-10-CM

## 2011-03-03 LAB — BASIC METABOLIC PANEL
BUN: 21 mg/dL (ref 6–23)
BUN: 23 mg/dL (ref 6–23)
CO2: 31 mEq/L (ref 19–32)
CO2: 33 mEq/L — ABNORMAL HIGH (ref 19–32)
Calcium: 9.1 mg/dL (ref 8.4–10.5)
Calcium: 9.5 mg/dL (ref 8.4–10.5)
Chloride: 101 mEq/L (ref 96–112)
Chloride: 100 mEq/L (ref 96–112)
Creatinine, Ser: 1.13 mg/dL (ref 0.50–1.35)
Creatinine, Ser: 1.22 mg/dL (ref 0.50–1.35)
GFR calc Af Amer: >60 mL/min (ref >60)
GFR calc Af Amer: >60 mL/min (ref >60)
GFR calc non Af Amer: >60 mL/min (ref >60)
GFR calc non Af Amer: 57 mL/min — ABNORMAL LOW (ref >60)
Glucose, Bld: 101 mg/dL — ABNORMAL HIGH (ref 70–99)
Glucose, Bld: 109 mg/dL — ABNORMAL HIGH (ref 70–99)
Potassium: 2.6 mEq/L — CL (ref 3.5–5.1)
Potassium: 3.4 mEq/L — ABNORMAL LOW (ref 3.5–5.1)
Sodium: 142 mEq/L (ref 135–145)
Sodium: 142 mEq/L (ref 135–145)

## 2011-03-03 LAB — PROTIME-INR
INR: 2.96 — ABNORMAL HIGH (ref 0.00–1.49)
Prothrombin Time: 31.3 seconds — ABNORMAL HIGH (ref 11.6–15.2)

## 2011-03-04 ENCOUNTER — Ambulatory Visit: Payer: Medicare Other | Admitting: Infectious Diseases

## 2011-03-06 DIAGNOSIS — R55 Syncope and collapse: Secondary | ICD-10-CM

## 2011-03-06 NOTE — Discharge Summary (Signed)
NAME:  [name], ZOLLNER NO.:  [id number]  MEDICAL RECORD NO.:  [id number]  LOCATION:  2040                         FACILITY:  [facility]  PHYSICIAN:  Doylene Canning. Ladona Ridgel, MD    DATE OF BIRTH:  1928-08-15  DATE OF ADMISSION:  02/28/2011 DATE OF DISCHARGE:  03/03/2011                              DISCHARGE SUMMARY   PROCEDURES:  Two-view chest x-ray.  PRIMARY FINAL DISCHARGE DIAGNOSIS:  Presyncope.  SECONDARY DIAGNOSES: 1. History of symptomatic bradycardia with syncope, status post     Medtronic pacemaker in 2009. 2. History of endocarditis in June 2012, status post explantation of     pacemaker. 3. Status post aortocoronary bypass surgery in 2008 with two vessels,     no further details available and a bioprosthetic aortic valve     replacement, mitral valve repair and Cox Maze procedure. 4. History of atrial fibrillation and left-sided atrial flutter. 5. History of endocarditis in June 2012. 6. History of mixed ischemic and nonischemic cardiomyopathy with an     ejection fraction of 60-65%, no vegetations and normal bowel     function by echocardiogram in July 2012. 7. History of nonobstructive carotid arterial disease in 2009 (less     than 50% bilaterally). 8. Hypertension. 9. Benign prostatic hypertrophy. 10.Osteoarthritis with left total hip replacement. 11.Obstructive sleep apnea, on continuous positive airway pressure. 12.History of right hand and carpal tunnel surgery. 13.Family history of coronary artery disease in his father.  TIME AT DISCHARGE:  41 minutes.  HOSPITAL COURSE:  [name] is a 77 year old male with a history of coronary artery disease.  He had presyncope and came to the hospital where he was admitted for further evaluation and treatment.  His symptoms were concerning for bradyarrhythmias, some pauses, and bradycardia were seen, but his heart rate was generally sustained greater than 50.  He was seen by Infectious Disease to offer an  opinion on replacing his pacemaker.  Dr. Daiva Eves followed up on blood cultures that had been done on February 23, 2011 and these were negative.  He felt that it was okay to reimplant the device.  Mr. Alcorn had been on Coumadin prior to admission and this was continued.  His INR was therapeutic during his hospital stay.  He had problems with hypokalemia and his potassium was supplemented as needed.  He was on Lasix on admission, but the dose was decreased to avoid hypotension.  He remained in atrial fibrillation, but with no critical bradycardia and no significant tachycardia either.  His blood pressure was well controlled.  Dr. Ladona Ridgel evaluated Mr. Gee and felt that it was unclear it the syncope was from bradycardia or not.  Therefore, an outpatient event monitor is indicated and he is to follow up in the office.  On March 03, 2011, Mr. Ballo had been seen by Physical Therapy with no further needs identified.  He was ambulating without chest pain or shortness of breath and had no critical arrhythmias on telemetry.  Dr. Ladona Ridgel considered him stable for discharge, to follow up as an outpatient.  DISCHARGE INSTRUCTIONS: 1. His activity level is to be increased gradually. 2. He is encouraged to stick  to a low-sodium, heart-healthy diet. 3. He is to follow up with Dr. Johney Frame in [place] on April 02, 2011, at     8:45 after his event monitor. 4. Reimplantation of the pacemaker can be discussed at that time. 5. He is to follow up with Dr. Andee Lineman and Dr. Criss Alvine as needed or as     scheduled.  DISCHARGE MEDICATIONS: 1. Benazepril 40 mg a day. 2. Amiodarone 100 mg a day. 3. Coumadin 3 mg, one half tablet today and then one tablet daily,     Coumadin check Monday or Tuesday next week. 4. Coreg 3.125 b.i.d. 5. Metamucil daily. 6. Amlodipine 5 mg a day. 7. Saline nasal spray p.r.n. 8. Oxybutynin 5 mg nightly. 9. Pravachol 40 mg daily. 10.Lasix 40 mg one half tablet daily, one tablet daily p.r.n.  for     weight gain of 3 pounds in 24 hours or 5 pounds in a week. 11.Multivitamins daily. 12.Aspirin 81 mg a day. 13.K-Dur 20 mEq a day.     Theodore Demark, PA-C   ______________________________ Doylene Canning. Ladona Ridgel, MD    RB/MEDQ  D:  03/03/2011  T:  03/04/2011  Job:  [id number]  cc:   Albertha Ghee III, MD  Electronically Signed by Theodore Demark PA-C on 03/05/2011 02:29:21 PM Electronically Signed by Lewayne Bunting MD on 03/06/2011 09:54:49 AM

## 2011-03-08 ENCOUNTER — Telehealth: Payer: Self-pay | Admitting: Physician Assistant

## 2011-03-08 NOTE — Telephone Encounter (Signed)
[name] is a 77 y.o. male with a h/o AFib and prior pacer, s/p explant in setting of SBE who was recently admitted for near syncope.  He is now on an event monitor.  [employer] called the answering service for 3 significant pauses tonight.  The patient had 4.3 sec pause, then went in AF with HR in 60s, then a 5.5 sec pause followed by sinus brady with HR 23, then another 4.3 second pause.  Last check demonstrated AFib with HR in 60s.   I do not have access to a fax machine.  I called the patient.  He denies syncope.  He had some lightheadedness.  No chest pain or SOB.  I spoke with the patient and his wife.  I have recommended he go to the closest emergency room, which is [place].  I have called [facility] to make them aware the patient is coming. Tereso Newcomer, PA-C

## 2011-03-09 ENCOUNTER — Inpatient Hospital Stay (HOSPITAL_COMMUNITY): Payer: Medicare Other

## 2011-03-09 ENCOUNTER — Inpatient Hospital Stay (HOSPITAL_COMMUNITY)
Admission: EM | Admit: 2011-03-09 | Discharge: 2011-03-12 | DRG: 244 | Disposition: A | Discharge status: Home / Self Care | Payer: Medicare Other | Source: Other Acute Inpatient Hospital | Attending: Internal Medicine | Admitting: Internal Medicine

## 2011-03-09 DIAGNOSIS — I10 Essential (primary) hypertension: Secondary | ICD-10-CM | POA: Diagnosis present

## 2011-03-09 DIAGNOSIS — N400 Enlarged prostate without lower urinary tract symptoms: Secondary | ICD-10-CM | POA: Diagnosis present

## 2011-03-09 DIAGNOSIS — I498 Other specified cardiac arrhythmias: Principal | ICD-10-CM | POA: Diagnosis present

## 2011-03-09 DIAGNOSIS — Z8249 Family history of ischemic heart disease and other diseases of the circulatory system: Secondary | ICD-10-CM

## 2011-03-09 DIAGNOSIS — G4733 Obstructive sleep apnea (adult) (pediatric): Secondary | ICD-10-CM | POA: Diagnosis present

## 2011-03-09 DIAGNOSIS — I2510 Atherosclerotic heart disease of native coronary artery without angina pectoris: Secondary | ICD-10-CM | POA: Diagnosis present

## 2011-03-09 DIAGNOSIS — Z8679 Personal history of other diseases of the circulatory system: Secondary | ICD-10-CM

## 2011-03-09 DIAGNOSIS — M1990 Unspecified osteoarthritis, unspecified site: Secondary | ICD-10-CM | POA: Diagnosis present

## 2011-03-09 DIAGNOSIS — Z96649 Presence of unspecified artificial hip joint: Secondary | ICD-10-CM

## 2011-03-09 DIAGNOSIS — R42 Dizziness and giddiness: Secondary | ICD-10-CM

## 2011-03-09 DIAGNOSIS — Z954 Presence of other heart-valve replacement: Secondary | ICD-10-CM

## 2011-03-09 DIAGNOSIS — Z951 Presence of aortocoronary bypass graft: Secondary | ICD-10-CM

## 2011-03-09 LAB — COMPREHENSIVE METABOLIC PANEL
ALT: 9 U/L (ref 0–53)
AST: 16 U/L (ref 0–37)
Albumin: 3.4 g/dL — ABNORMAL LOW (ref 3.5–5.2)
Alkaline Phosphatase: 62 U/L (ref 39–117)
BUN: 21 mg/dL (ref 6–23)
CO2: 27 mEq/L (ref 19–32)
Calcium: 9.3 mg/dL (ref 8.4–10.5)
Chloride: 106 mEq/L (ref 96–112)
Creatinine, Ser: 0.93 mg/dL (ref 0.50–1.35)
GFR calc Af Amer: >60 mL/min (ref >60)
GFR calc non Af Amer: >60 mL/min (ref >60)
Glucose, Bld: 93 mg/dL (ref 70–99)
Potassium: 3.4 mEq/L — ABNORMAL LOW (ref 3.5–5.1)
Sodium: 140 mEq/L (ref 135–145)
Total Bilirubin: 0.3 mg/dL (ref 0.3–1.2)
Total Protein: 6.6 g/dL (ref 6.0–8.3)

## 2011-03-09 LAB — MAGNESIUM: Magnesium: 2.2 mg/dL (ref 1.5–2.5)

## 2011-03-09 LAB — TSH: TSH: 7.382 u[IU]/mL — ABNORMAL HIGH (ref 0.350–4.500)

## 2011-03-09 LAB — PROTIME-INR
INR: 3.29 — ABNORMAL HIGH (ref 0.00–1.49)
Prothrombin Time: 34 seconds — ABNORMAL HIGH (ref 11.6–15.2)

## 2011-03-09 LAB — MRSA PCR SCREENING: MRSA by PCR: NEGATIVE

## 2011-03-09 NOTE — Consult Note (Signed)
NAME:  [name], [name] NO.:  [id number]  MEDICAL RECORD NO.:  [id number]  LOCATION:  2040                         FACILITY:  [facility]  PHYSICIAN:  Cassell Clement, M.D. DATE OF BIRTH:  28-Nov-1928  DATE OF CONSULTATION:  03/01/2011 DATE OF DISCHARGE:                                CONSULTATION   PRIMARY CARDIOLOGIST:  [name], MD, FACC  ELECTROPHYSIOLOGIST:  Hillis Range, MD  PRIMARY CARE DOCTOR:  De Hollingshead in [place], [place].  PATIENT PROFILE:  A 77 year old male with prior history of symptomatic bradycardia and syncope status post pacemaker and subsequent pacemaker explant related to endocarditis who presents with presyncope.  PROBLEM LIST: 1. Presyncope. 2. History of symptomatic bradycardia and syncope.     a.     Status post Medtronic dual-chamber permanent pacemaker, June      2009.     b.     Status post explant of permanent pacemaker June 2012      secondary to endocarditis. 3. Coronary artery disease.     a.     Status post coronary artery bypass graft x2 in 2008 at [facility]. 4. History of aortic insufficiency and mitral regurgitation.     a.     Status post bioprosthetic aortic valve replacement and      mitral valve replacement at [facility] in 2008. 5. History of atrial fibrillation/left-sided atrial flutter.     a.     Status post Cox maze procedure at [facility] in 2008. 6. History of endocarditis.     a.     First episode occurred preoperatively in 2008.     b.     Status post prolonged course of antibiotics starting in June      2012 related to endocarditis with pacemaker explant at that time.      Infectious Disease follow up tentatively scheduled for August 8. 7. History of mixed ischemic and nonischemic cardiomyopathy.     a.     February 19, 2011, 2-D echocardiogram, ejection fraction 60-65%,      normal wall motion, no vegetation, normal valvular function. 8. History of Enterococcus faecalis bacteremia, June 2012. 9. History of  nonobstructive carotid arterial disease with less than     50% stenoses bilaterally in 2009. 10.Hypertension. 11.Benign prostatic hypertrophy. 12.Osteoarthritis status post left total hip arthroplasty. 13.Obstructive sleep apnea, on CPAP. 14.Status post right hand surgery, November 2010. 15.Status post right carpal tunnel release, August 2009.  ALLERGIES:  No known drug allergies.  HISTORY OF PRESENT ILLNESS:  A 77 year old male with the above-complex problem list.  The patient has a history of bradycardia with post termination pauses and was status post pacemaker placement in 2009, but this required explantation in June 2012 secondary to endocarditis.  The patient had since completed his course of antibiotics and ID follow up scheduled.  He was seen in the office on July 26 by Dr. Johney Frame and was doing well at that time.  He was noted be in A flutter with variable ventricular response at a rate of 50 which was asymptomatic.  He has been maintained on amiodarone and carvedilol.  It is accepted that the patient will require  reimplantation of pacemaker; however, hope has been to hold off as long as possible to distance him from his endocarditis episode.  He has follow up with Infectious Disease scheduled for later this week.  Over the past few months, the patient has had 4 lightheaded spells.  The first occurred prior to his pacer explant and he described this as blurred vision while in his car, had to stop, lasted about 5 seconds, and resolving spontaneously.  Second episode occurred shortly after being discharged after his explant and it was described as a pressure- like sensation around his head followed by lightheadedness and feeling as though he might pass out.  He did not had any blackening of his vision or vertiginous-type symptoms.  Symptoms lasted about 5-10 seconds and resolved spontaneously.  The third and fourth episodes both occurred yesterday and were similar in  description to the second episode described above.  The episode that occurred yesterday evening while he was sitting on his porch lasted about 10-15 seconds and was more pronounced, although he did not experience syncope.  It was witnessed by his wife who said his stand shaking somewhat.  Immediately following the episode, she checked his blood pressure and noted that his heart rate was normal while his blood pressure was elevated.  They decided to have him come to the ED for evaluation.  In the ED, he was in A flutter with variable response with rates between 50s and 60s.  He has had no recurrence of presyncope.  On the monitor, he has had a few pauses of greater than 2 seconds but nothing greater than 3 seconds.  We have been asked to evaluate.  CURRENT MEDICATIONS: 1. Amiodarone 100 mg daily. 2. Norvasc 5 mg daily. 3. Aspirin 81 mg q.48 h. 4. Lotensin 40 mg daily. 5. Coreg 3.125 mg b.i.d. 6. Lasix 80 mg b.i.d. 7. Ditropan 5 mg daily. 8. K-Dur 20 mEq daily. 9. Simvastatin 20 mg at bedtime. 10.Coumadin per pharmacy.  FAMILY HISTORY:  Mother died of cancer at 59.  Father died of heart disease at 68.  He has 2 sisters, one is 19, is alive and well and the other is 8, who recently had a mini stroke.  SOCIAL HISTORY:  The patient lives in [place] with his wife.  He is retired.  He previously smoked for a few years but quit in 1957.  He drinks a half a glass of red wine each night.  He denies any drug use. He does not routinely exercise secondary to chronic left hip pain.  REVIEW OF SYSTEMS:  Positive for presyncope as outlined above. Otherwise, all systems are reviewed and negative.  He is a full code.  PHYSICAL EXAMINATION:  VITAL SIGNS:  Temperature 97.9, heart rate 63, respirations 19, blood pressure 137/75, and pulse oximetry 95% on room air.  Weight 80 kg.  Orthostatic vital signs are unrevealing. GENERAL:  A pleasant white male in no acute distress.  Awake, alert,  and oriented x3.  He has a normal affect. HEENT:  Normal. NEUROLOGIC:  Grossly intact and nonfocal. SKIN:  Warm and dry without lesions or masses. NECK:  Supple without bruits or JVD. LUNGS:  Respirations are regular and unlabored with mildly diminished breath sounds in the left base but otherwise clear to auscultation. CARDIAC:  Irregular S1-S2.  A 3/6 systolic murmur loudest at the right upper sternal border but heard throughout. ABDOMEN:  Round, soft, nontender, and nondistended.  Bowel sounds present x4. EXTREMITIES:  Warm, dry, and pink  with trace bilateral lower extremity edema.  Distal pulses are 2+ and equal bilaterally.  EKG shows A flutter with variable response, normal axis, rate of 62, no acute ST-T changes.  Hemoglobin 11.9, hematocrit 35.7, WBC 7.1, and platelets 154.  Sodium 140, potassium 4.7, chloride 102, CO2 of 31, BUN 23, creatinine 1.13, and glucose 94.  Troponin I less than 0.30.  INR 2.7.  ASSESSMENT AND PLAN: 1. Presyncope.  The patient had increasing frequency of presyncopal     episodes, two occurring during the day yesterday, that are     concerning for potential bradyarrhythmias.  The patient has a     history of atrial flutter with variable response with rates     generally in the 50s to 60s and here has had a few asymptomatic     pauses of greater than 2 seconds, but no prolonged pauses.  He is     followed by Dr. Johney Frame with a goal of holding off on pacemaker     implantation as long as possible to further distance him from his     recent bout of endocarditis.  Given his symptoms, however, it is     likely he will need pacemaker placement this admission.  Therefore,     we will keep him in the hospital and transfer him to our service.     We will ask Infectious Disease to see him in the a.m. for clearance     for possible pacemaker placement this admission.  EP to see as     well. 2. History of atrial arrhythmias.  The patient remains on amiodarone      as well as low-dose carvedilol.  We will not change his dose of     either of these medications.  He is on chronic Coumadin therapy.     INR is 2.7.  We like to keep him in the lower end of the     therapeutic range given probable pacer placement this admission. 3. Coronary artery disease.  The patient denies any history of chest     pain. 4. Recent Enterococcus faecalis endocarditis.  The patient is status     post antibiotic placement and has been followed by Infectious     Disease as an outpatient.  He has had surveillance blood cultures     drawn and these have not shown growth.  We will ask Infectious     Disease to see him in the a.m. to assist [place] as we go forward. 5. History of mixed ischemic and nonischemic cardiomyopathy.  Follow     up echo July 26 showed normal left ventricular function.  His     volume status is stable.  Continue low-dose beta-blocker therapy     and ACE inhibitor.     Nicolasa Ducking, ANP   ______________________________ Cassell Clement, M.D.    CB/MEDQ  D:  03/01/2011  T:  03/01/2011  Job:  [id number]  Electronically Signed by Nicolasa Ducking ANP on 03/06/2011 03:35:45 PM Electronically Signed by Cassell Clement M.D. on 03/09/2011 02:29:05 PM

## 2011-03-10 DIAGNOSIS — I495 Sick sinus syndrome: Secondary | ICD-10-CM

## 2011-03-10 LAB — CBC
HCT: 33.2 % — ABNORMAL LOW (ref 39.0–52.0)
Hemoglobin: 10.8 g/dL — ABNORMAL LOW (ref 13.0–17.0)
MCH: 26.9 pg (ref 26.0–34.0)
MCHC: 32.5 g/dL (ref 30.0–36.0)
MCV: 82.6 fL (ref 78.0–100.0)
Platelets: 114 10*3/uL — ABNORMAL LOW (ref 150–400)
RBC: 4.02 MIL/uL — ABNORMAL LOW (ref 4.22–5.81)
RDW: 16.3 % — ABNORMAL HIGH (ref 11.5–15.5)
WBC: 5.9 10*3/uL (ref 4.0–10.5)

## 2011-03-10 LAB — BASIC METABOLIC PANEL
BUN: 14 mg/dL (ref 6–23)
CO2: 27 mEq/L (ref 19–32)
Calcium: 8.8 mg/dL (ref 8.4–10.5)
Chloride: 111 mEq/L (ref 96–112)
Creatinine, Ser: 0.89 mg/dL (ref 0.50–1.35)
GFR calc Af Amer: >60 mL/min (ref >60)
GFR calc non Af Amer: >60 mL/min (ref >60)
Glucose, Bld: 94 mg/dL (ref 70–99)
Potassium: 3.3 mEq/L — ABNORMAL LOW (ref 3.5–5.1)
Sodium: 143 mEq/L (ref 135–145)

## 2011-03-10 LAB — PROTIME-INR
INR: 3.01 — ABNORMAL HIGH (ref 0.00–1.49)
Prothrombin Time: 31.7 seconds — ABNORMAL HIGH (ref 11.6–15.2)

## 2011-03-11 DIAGNOSIS — I498 Other specified cardiac arrhythmias: Secondary | ICD-10-CM

## 2011-03-11 LAB — PROTIME-INR
INR: 2.56 — ABNORMAL HIGH (ref 0.00–1.49)
Prothrombin Time: 27.9 seconds — ABNORMAL HIGH (ref 11.6–15.2)

## 2011-03-11 LAB — APTT: aPTT: 43 seconds — ABNORMAL HIGH (ref 24–37)

## 2011-03-12 ENCOUNTER — Inpatient Hospital Stay (HOSPITAL_COMMUNITY): Payer: Medicare Other

## 2011-03-13 ENCOUNTER — Ambulatory Visit: Payer: Medicare Other | Admitting: Cardiology

## 2011-03-14 ENCOUNTER — Telehealth: Payer: Self-pay | Admitting: Nurse Practitioner

## 2011-03-14 NOTE — Telephone Encounter (Signed)
pts wife called stating that since d/c following ppm replacement earlier this week (d/c'd 8/16), he has not had a bowel movement despite using metamucil and MOM.  He feels bloated and is getting uncomfortable.  I recommended that they try and OTC Fleets enema and that if this is not successful and pt is more uncomfortable, consider eval @ local UC or ER.  Pts. Wife verbalized understanding.

## 2011-03-15 NOTE — Consult Note (Signed)
NAME:  [name], [name] NO.:  [id number]  MEDICAL RECORD NO.:  [id number]  LOCATION:  2040                         FACILITY:  [facility]  PHYSICIAN:  Acey Lav, MD  DATE OF BIRTH:  27-Aug-1928  DATE OF CONSULTATION:  03/02/2011 DATE OF DISCHARGE:                                CONSULTATION   REQUESTING PHYSICIAN:  Cassell Clement, M.D. with Cardiology.  REASON FOR INFECTIOUS DISEASE CONSULTATION:  Clearance for reimplantation of pacemaker in patient with prior enterococcal bacteremia and presumed pacemaker infection.  DISCUSSION:  I have examined the patient.  I have reviewed the pertinent electronic and the paper medical record and reviewed pertinent laboratory data including microbiological data.  Briefly, this is a 77 year old Caucasian male that I met in June of 2012.  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  He was admitted to the hospital in [facility] in June and found to have enterococcus faecalis bacteremia.  I saw the patient as an inpatient and was concerned for infection of the pacemaker lead.  He was admitted to the hospital and placed on a broad-spectrum antibiotics initially as there was concern also for pneumonia, but ultimately narrowed to ampicillin and gentamicin.  He had repeat blood cultures done on June 16 when he was admitted to [facility] and these were negative for growth of any organism.  His pacemaker was removed on the 18th.  He continued on ampicillin and gentamicin and continued therapy through February 06, 2011. He had repeat surveillance cultures done on 3 occasions, the first was on the February 09, 2011 three days after stopping antibiotics and this  did not grow any organisms, the next was on the 23rd which was 10 days after stopping antibiotics and these failed to grow any organism.  The final one was on the 30th and do not yet have the results back from his primary care physician with regards to these culture data.  In any case, he appears to have received more than a month of antibiotics after initial diagnosis of his bacteremia and at least 26 days of effective antibiotics with beta-lactam and aminoglycoside for enterococcal endocarditis treatment and his renal cultures are negative.  In the interim, unfortunately he has had three episodes of presyncopal symptoms, one occurred while he was driving his car and required to pull off to the side road, two occurred this past Saturday and ultimately led to his admission to [facility].  The patient has not had much in the way of chest infection.  He occasionally has chills, but he has had these chronically and he attributes this to having anemia.  He is otherwise not had any fevers or drenching sweats or other evidence to suggest recurrence of infection.  He has been admitted to the cardiology service and while an inpatient so far has been afebrile.  His labs are reviewed and showed him to have normal white count,  slightly depressed hemoglobin, INR of 2.70.  Serum creatinine of 1.13, troponin of note that was normal.  We were consulted to comment whether this patient has received adequate therapy for enterococcal bacteremia with the presumption of endocarditis and device-related infection.  PHYSICAL EXAMINATION:  GENERAL:  On exam, the patient is afebrile, in no acute distress.  He was walking down the hallway conducting physical therapy with his physical therapist and then later with his wife with the walker. HEENT:  He is normocephalic, atraumatic.  Pupils are equal, round, and reactive to light.  Sclerae icteric.  Oropharynx clear. NECK:  Supple. CARDIOVASCULAR:  Regular  rate and rhythm with 2/6 systolic murmur most prominent at the PMI. ABDOMEN:  Soft, nondistended, nontender. EXTREMITIES:  Without edema. NEUROLOGIC:  Exam was nonfocal. SKIN:  No evidence of petechiae or splinter hemorrhages.  LABORATORY DATA:  As described above.  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  The patient has in total received more than a month of effective antibiotic therapy with beta lactam and aminoglycoside.  He has received 26 days of beta lactam therapy with aminoglycoside therapy after explantation of his pacemaker leads.  His surveillance cultures have been done have so far all been negative.  I will check his doctor's office, his primary care physician, Lajoyce Lauber, his phone number [phone], fax number [phone].  I will call him and request the final cultures be faxed to me from the February 23, 2011.  If these cultures were negative, I feel confident that we have aggressively and effectively cured his bacteremia.  Certainly, I feel safe with cardiology proceeding to reimplant his device again provided his surveillance cultures on the 30th were negative.  Thank you for this Infectious Disease consultation.  I will leave a note back in the chart as to whether his cultures were negative on the 30th and give the go ahead for reimplantation of his echo if this is negative.   ADDENDUM: Surveillance cultures faxed to me by PCP were also negative.  Acey Lav, MD     CV/MEDQ  D:  03/02/2011  T:  03/02/2011  Job:  [id number]  Electronically Signed by Paulette Blanch DAM MD on  03/15/2011 11:24:16 AM

## 2011-03-19 NOTE — Discharge Summary (Signed)
NAME:  [name], [name] NO.:  [id number]  MEDICAL RECORD NO.:  [id number]  LOCATION:  3712                         FACILITY:  [facility]  PHYSICIAN:  Hillis Range, MD       DATE OF BIRTH:  1929-06-20  DATE OF ADMISSION:  03/09/2011 DATE OF DISCHARGE:  03/12/2011                              DISCHARGE SUMMARY   PROCEDURES:  Insertion of a Medtronic dual lead permanent pacemaker.  PRIMARY FINAL DISCHARGE DIAGNOSIS:  Symptomatic bradycardia.  SECONDARY DIAGNOSES: 1. History of syncope with bradycardia in 2009, leading to a     pacemaker. 2. Endocarditis in June 2012, with explantation of pacemaker. 3. Status post aortocoronary bypass surgery in 2008, 2 vessel with a     bioprosthetic aortic valve, mitral valve repair, and Cox maze     procedure. 4. History of atrial fibrillation and left-sided atrial flutter. 5. History of mixed ischemic and nonischemic cardiomyopathy, EF     improved to 60% by echocardiogram in July 2012. 6. Cerebrovascular disease, less than 50% bilateral internal carotid     artery stenosis. 7. Hypertension. 8. Benign prostatic hypertrophy. 9. Osteoarthritis. 10.Left total hip replacement. 11.Obstructive sleep apnea on CPAP. 12.History of right hand surgery and carpal tunnel surgery. 13.Family history of coronary artery disease.  TIME AT DISCHARGE:  34 minutes.  HOSPITAL COURSE:  Mr. Leard is a 77 year old male with a recent admission for syncope.  He had recurrent syncopal episodes and an outpatient monitor showed a 5-second pauses.  He came to the hospital and was admitted for permanent pacemaker.  His Coumadin was held and his INR was allowed to drift down.  By March 11, 2011, his INR was 2.56.  A permanent pacemaker was implanted without complications.  He had some hypokalemia and his potassium was supplemented.  He was continued on his home dose of Lasix.  He was also on amiodarone.  On March 12, 2011, the postprocedure chest  x-ray showed pacemaker leads in satisfactory position with no pneumothorax.  His device was checked and had normal device function.  He was evaluated by Dr. Ladona Ridgel and considered stable for discharge to follow up as an outpatient.  DISCHARGE INSTRUCTIONS:  His activity level is to be increased gradually per the discharge instruction sheet.  He is to get a wound check in [place] on March 26, 2011, at 2 p.m. and follow up with Dr. Johney Frame in [place] on June 25, 2011, at 1:45.  He is to follow up with Dr. Andee Lineman and Dr. Criss Alvine as needed.  He is to get a Coumadin check next week.  DISCHARGE MEDICATIONS: 1. Benazepril 40 mg a day. 2. Amiodarone 200 mg 1/2 tablet daily. 3. Coumadin 3 mg at bedtime. 4. Coreg 3.125 mg b.i.d. 5. Metamucil daily. 6. Amlodipine 5 mg a day. 7. Sodium chloride nasal spray at bedtime p.r.n. 8. Oxybutynin 5 mg at bedtime. 9. Pravachol 40 mg at bedtime. 10.Lasix 40 mg 1/2 tab daily p.r.n. 11.Multivitamins daily. 12.Aspirin 81 mg every other day. 13.Potassium 20 mEq daily with Lasix.     Theodore Demark, PA-C   ______________________________ Hillis Range, MD    RB/MEDQ  D:  03/12/2011  T:  03/13/2011  Job:  [id number]  cc:   Dr. Criss Alvine  Electronically Signed by Theodore Demark PA-C on 03/18/2011 06:38:00 AM Electronically Signed by Hillis Range MD on 03/19/2011 09:44:28 AM

## 2011-03-23 ENCOUNTER — Ambulatory Visit: Payer: BC Managed Care – PPO | Admitting: *Deleted

## 2011-03-24 ENCOUNTER — Other Ambulatory Visit: Payer: Self-pay | Admitting: *Deleted

## 2011-03-24 MED ORDER — POTASSIUM CHLORIDE CRYS ER 20 MEQ PO TBCR: 20.0000 meq | EXTENDED_RELEASE_TABLET | Freq: Two times a day (BID) | ORAL | Status: DC

## 2011-03-26 ENCOUNTER — Ambulatory Visit (INDEPENDENT_AMBULATORY_CARE_PROVIDER_SITE_OTHER): Payer: Medicare Other | Admitting: *Deleted

## 2011-03-26 DIAGNOSIS — Z950 Presence of cardiac pacemaker: Secondary | ICD-10-CM

## 2011-03-26 DIAGNOSIS — I4892 Unspecified atrial flutter: Secondary | ICD-10-CM

## 2011-03-26 DIAGNOSIS — I4891 Unspecified atrial fibrillation: Secondary | ICD-10-CM

## 2011-03-26 LAB — PACEMAKER DEVICE OBSERVATION
AL AMPLITUDE: 0.7 mv
AL IMPEDENCE PM: 558 Ohm
ATRIAL PACING PM: 1
BATTERY VOLTAGE: 2.79 V
BRDY-0002RV: 60 {beats}/min
RV LEAD AMPLITUDE: 22.4 mv
RV LEAD IMPEDENCE PM: 633 Ohm
RV LEAD THRESHOLD: 0.5 V
VENTRICULAR PACING PM: 1

## 2011-03-26 NOTE — Progress Notes (Signed)
Pacer check in clinic  

## 2011-04-02 ENCOUNTER — Ambulatory Visit: Payer: Medicare Other | Admitting: Internal Medicine

## 2011-04-02 ENCOUNTER — Encounter: Payer: BC Managed Care – PPO | Admitting: Internal Medicine

## 2011-04-19 NOTE — Progress Notes (Signed)
NAME:  [name], JOUNG NO.:  [id number]  MEDICAL RECORD NO.:  [id number]  LOCATION:  3315                         FACILITY:  [facility]  PHYSICIAN:  Henderson Cloud, MD     DATE OF BIRTH:  08/10/1928                                PROGRESS NOTE   ATTENDING PHYSICIAN: Dr. Johney Frame.  CHIEF COMPLAINT: Dizziness.  HISTORY OF PRESENT ILLNESS: The patient is a 77 year old white male, past medical history significant for syncope and bradycardia which lead to pacemaker placement in 2009, endocarditis in June 2012 status post explantation of pacemaker, coronary artery disease status post 2-vessel bypass surgery in 2008 with bioprosthetic aortic valve placement and mitral valve repair at that time, who is presenting with recurrent dizziness.  The patient was discharged from the hospital on March 04, 2011 with home telemetry monitor.  During that hospital admission, he was seen by Infectious Disease, who felt that it would be safe to reimplant a device as the patient's blood cultures have been negative.  While at home this evening, the patient had a several-second episode of dizziness while in the seated position.  The home telemetry monitoring noted a pause greater than 4 seconds correlated with these symptoms.  The patient states other than this one episode, he has been feeling more fatigued than he had prior to pacemaker explantation.  PAST MEDICAL HISTORY: As above in HPI.  The patient also has a history of: 1. Atrial fibrillation. 2. Nonobstructive carotid artery disease. 3. Hypertension. 4. BPH. 5. Osteoarthritis. 6. Obstructive sleep apnea.  SOCIAL HISTORY: He lives with his wife, does not smoke or use alcohol.  FAMILY HISTORY: Noncontributory.  ALLERGIES: No known drug allergies.  MEDICATIONS: 1. Aspirin 81 mg daily. 2. Coumadin as directed by his INR. 3. Amiodarone 100 mg daily. 4. Norvasc 5 mg daily. 5. Coreg 3.125 mg b.i.d. 6. Benazepril 40 mg  daily. 7. Oxybutynin 5 mg every evening. 8. Pravastatin 40 mg daily. 9. Lasix 20 mg daily.  REVIEW OF SYSTEMS: As in HPI.  All other systems are reviewed and are negative.  PHYSICAL EXAMINATION: VITAL SIGNS:  Temperature 97.6, blood pressure 130/50, heart rate 60, respiratory rate 12, satting 98% on 2 L. GENERAL:  No acute distress. HEENT:  Normocephalic, atraumatic. NECK:  Supple. HEART:  Regular rate and rhythm with a 3/6 holosystolic murmur throughout the precordium. LUNGS:  Clear bilaterally. ABDOMEN:  Soft, nontender, nondistended. EXTREMITIES: A 1+ bilateral lower extremity edema. SKIN:  Warm and dry. PSYCHIATRIC:  The patient is appropriate.  LABORATORY DATA: Sodium 138, potassium 3.5, chloride 105, CO2 of 28, BUN 25, creatinine 1.1, glucose 95.  White count 7, hemoglobin 11, hematocrit 35, platelet count 121.  Reviewed the at the patient's telemetry strips from the home monitoring system, demonstrates an approximate 5-second pause with a baseline atrial fibrillation.  ASSESSMENT: 1. Symptomatic bradycardia with a pause greater than 4 seconds. 2. Atrial fibrillation. 3. Coronary artery disease. 4. Valvular heart disease. 5. History of recent endocarditis.  PLAN: The patient will be admitted.  His Coreg will be held.  His other home medications with the exception of Coumadin will be given.  Coumadin will be held in  anticipation for a pacemaker reinsertion.     Henderson Cloud, MD     SGA/MEDQ  D:  03/09/2011  T:  03/09/2011  Job:  [id number]  Electronically Signed by Raynelle Bring MD on 04/19/2011 10:05:54 AM

## 2011-04-23 LAB — BASIC METABOLIC PANEL
BUN: 16
BUN: 24 — ABNORMAL HIGH
CO2: 28
CO2: 32
Calcium: 9.2
Calcium: 9.2
Chloride: 104
Chloride: 104
Creatinine, Ser: 0.96
Creatinine, Ser: 1.23
GFR calc Af Amer: >60
GFR calc Af Amer: >60
GFR calc non Af Amer: 57 — ABNORMAL LOW
GFR calc non Af Amer: >60
Glucose, Bld: 126 — ABNORMAL HIGH
Glucose, Bld: 88
Potassium: 3.2 — ABNORMAL LOW
Potassium: 3.8
Sodium: 138
Sodium: 139

## 2011-04-23 LAB — PROTIME-INR
INR: 2.2 — ABNORMAL HIGH
INR: 2.2 — ABNORMAL HIGH
INR: 2.7 — ABNORMAL HIGH
Prothrombin Time: 24.7 — ABNORMAL HIGH
Prothrombin Time: 25.5 — ABNORMAL HIGH
Prothrombin Time: 29.2 — ABNORMAL HIGH

## 2011-04-23 LAB — APTT: aPTT: 40 — ABNORMAL HIGH

## 2011-04-24 LAB — BASIC METABOLIC PANEL
BUN: 19
CO2: 33 — ABNORMAL HIGH
Calcium: 9.5
Chloride: 104
Creatinine, Ser: 0.83
GFR calc Af Amer: >60
GFR calc non Af Amer: >60
Glucose, Bld: 69 — ABNORMAL LOW
Potassium: 4.1
Sodium: 140

## 2011-04-24 LAB — PROTIME-INR
INR: 1.3
Prothrombin Time: 17 — ABNORMAL HIGH

## 2011-04-24 LAB — APTT: aPTT: 33

## 2011-04-24 LAB — POCT HEMOGLOBIN-HEMACUE: Hemoglobin: 13.5

## 2011-04-27 ENCOUNTER — Other Ambulatory Visit: Payer: Self-pay | Admitting: *Deleted

## 2011-04-27 MED ORDER — AMIODARONE HCL 200 MG PO TABS: 100.0000 mg | ORAL_TABLET | Freq: Every day | ORAL | Status: DC

## 2011-05-07 NOTE — Op Note (Signed)
NAME:  [name], [name] NO.:  [id number]  MEDICAL RECORD NO.:  [id number]  LOCATION:  3712                         FACILITY:  [facility]  PHYSICIAN:  Doylene Canning. Ladona Ridgel, MD    DATE OF BIRTH:  August 07, 1928  DATE OF PROCEDURE:  03/11/2011 DATE OF DISCHARGE:                              OPERATIVE REPORT   PROCEDURE PERFORMED:  Dual-chamber pacemaker insertion.  INDICATIONS:  Symptomatic bradycardia with pauses of 5 seconds.  INTRODUCTION:  The patient is a 77 year old male with a history of atrial fibrillation/flutter, status post pacemaker insertion who developed pacemaker lead endocarditis approximately 3 months ago.  He underwent extraction.  He has been stable except he had recurrent syncopal episodes.  These were thought to be orthostatic in nature, however, a cardiac monitor was obtained and while the patient was in the supine position, he had a 5-second pause in atrial fibrillation/flutter. He is now referred for dual-chamber pacemaker insertion.  PROCEDURE:  After informed consent was obtained, the patient was taken to the diagnostic EP lab in a fasting state.  After usual preparation and draping, intravenous fentanyl and midazolam was given for sedation. Lidocaine 30 mL was infiltrated into the right infraclavicular region. A 5-cm incision was carried out over this region.  Electrocautery was utilized to dissect down to the fascial plane.  The right subclavian vein was punctured x2 and the Medtronic model 5076 52-cm active fixation pacing lead, serial number [id number] was advanced in the right ventricle and a Medtronic model 5076 45-cm active fixation pacing lead, serial number [id number] was advanced into the right atrium.  Mapping was carried out in the right ventricle.  At the final site, the R-waves were 13 mV, the pacing impedance was 850 ohms and threshold 0.7 volts at 0.5 milliseconds.  There was a modest injury current with active fixation of the lead.   With the ventricular lead in satisfactory position, attention was then turned to placement of the atrial lead.  It should be noted that the atrium was electrically fairly silent.  At the final site, the fibrillation waves measured between 0.7 and 1 mV. Pacing impedance with lead actively fixed was 520 ohms and 10 volts pacing both in the atrium and the ventricle did not stimulate the diaphragm.  With these satisfactory parameters, the leads were secured to the subpectoralis fascia with a figure-of-eight silk suture.  The sewing sleeve was secured with silk suture.  Electrocautery was utilized to make a subcutaneous pocket.  Antibiotic irrigation was utilized to irrigate the pocket and electrocautery was utilized to assure hemostasis.  The Medtronic Sensia dual-chamber pacemaker, serial number [id number] was connected to the atrial and RV leads and placed back in the subcutaneous pocket where they were secured.  The pocket was irrigated and the incision was closed with 2-0 and 3-0 Vicryl.  Benzoin and Steri-Strips were painted on the skin, a pressure dressing was applied, and the patient was returned to his room in satisfactory condition.  COMPLICATIONS:  There were no immediate procedure complications.  RESULTS:  Demonstrate successful implantation of a Medtronic dual- chamber pacemaker in a patient with symptomatic bradycardia.     Sharlot Gowda  Myna Hidalgo, MD     GWT/MEDQ  D:  03/11/2011  T:  03/12/2011  Job:  [id number]  cc:   Hillis Range, MD  Electronically Signed by Lewayne Bunting MD on 05/07/2011 06:33:59 PM

## 2011-05-18 ENCOUNTER — Ambulatory Visit (INDEPENDENT_AMBULATORY_CARE_PROVIDER_SITE_OTHER): Payer: Medicare Other | Admitting: Cardiology

## 2011-05-18 ENCOUNTER — Encounter: Payer: Self-pay | Admitting: Cardiology

## 2011-05-18 VITALS — BP 136/75 | HR 68 | Ht 67.0 in | Wt 179.0 lb

## 2011-05-18 DIAGNOSIS — I4891 Unspecified atrial fibrillation: Secondary | ICD-10-CM

## 2011-05-18 DIAGNOSIS — I498 Other specified cardiac arrhythmias: Secondary | ICD-10-CM

## 2011-05-18 DIAGNOSIS — I059 Rheumatic mitral valve disease, unspecified: Secondary | ICD-10-CM

## 2011-05-18 DIAGNOSIS — I25810 Atherosclerosis of coronary artery bypass graft(s) without angina pectoris: Secondary | ICD-10-CM

## 2011-05-18 DIAGNOSIS — I2589 Other forms of chronic ischemic heart disease: Secondary | ICD-10-CM

## 2011-05-18 DIAGNOSIS — Z950 Presence of cardiac pacemaker: Secondary | ICD-10-CM

## 2011-05-18 DIAGNOSIS — I359 Nonrheumatic aortic valve disorder, unspecified: Secondary | ICD-10-CM

## 2011-05-18 DIAGNOSIS — R0602 Shortness of breath: Secondary | ICD-10-CM

## 2011-05-18 NOTE — Patient Instructions (Signed)
   Lab - Blood cultures x 2 & sed rate   Echo   If the results of your test are normal or stable, you will receive a letter.  If they are abnormal, the nurse will contact you by phone. Your physician wants you to follow up in: 6 months.  You will receive a reminder letter in the mail one-two months in advance.  If you don't receive a letter, please call our office to schedule the follow up appointment

## 2011-05-20 ENCOUNTER — Telehealth: Payer: Self-pay | Admitting: *Deleted

## 2011-05-20 ENCOUNTER — Other Ambulatory Visit: Payer: Self-pay | Admitting: *Deleted

## 2011-05-20 ENCOUNTER — Encounter: Payer: Self-pay | Admitting: *Deleted

## 2011-05-20 DIAGNOSIS — Z01810 Encounter for preprocedural cardiovascular examination: Secondary | ICD-10-CM

## 2011-05-20 DIAGNOSIS — I4891 Unspecified atrial fibrillation: Secondary | ICD-10-CM

## 2011-05-20 NOTE — Telephone Encounter (Signed)
Patient scheduled for TEE/DCCV for Friday, 10/26 at 9 [facility]   Checking percert

## 2011-05-20 NOTE — Progress Notes (Signed)
    Sent from my iPhone   Begin forwarded message:  From: "DeGent, Guy" [email or web address]> Date: May 19, 2011, 1:15:58 PM EDT To: "Allred, Urbano" [email or web address]> Subject: Re: Billee Cashing Medtronic [facility]  [place]- will make sure he has appointment with you .  Inocencio Homes can you review Dr. Amedeo Plenty recommendation below regarding follow up and take care of this.  Lorre Munroe from my iPhone  On May 19, 2011, at 11:15 AM, "Allred, Jasyah" [email or web address]> wrote:  > I like that idea. Has he seen Sharlot Gowda since the procedure? If not, just make sure he has scheduled follow-up w me on [place]. Would typically be 3 months post implant. I could see him sooner if needed. > > Thanks > > Sent from my iPhone > > On May 19, 2011, at 10:58 AM, "DeGent, Guy" [email or web address]> wrote: > >> Gordy, >> Correct, you have not seem him since. If ok with you I am going to cardiovert him. He is on 100 mg Amio - I am going to bump his maintenance dose for now. >> Thanks >> [name] >> >> Sent from my iPhone >> >> On May 18, 2011, at 8:37 PM, "Allred, Rankin" [email or web address]> wrote: >> >>> Agreed. Appears to have a very high atrial fib burden on interrogation. I recall previously that he had atrial tachycardia and afib when in the hospital but was predominant sinus at that time. I don't think I've seen him since his new device was placed. >>> >>> Sent from my iPhone >>> >>> On May 18, 2011, at 5:45 PM, "Glory Buff, Hospital doctor" [email or web address]> wrote: >>> >>>> 95% of time. Because other atrial rates are relatively fast, would think that is prob undersensing. More than likely 100% afib >>>> >>>> Gypsy Balsam >>>> >>>> On May 18, 2011, at 5:08 PM, "Peyton Bottoms" [email or web address]> wrote: >>>> >>>>> Amber/Mavin >>>>> Patient in persistent afib- correct? >>>>> Let me know >>>>> [name] >>>>> >>>>> -- >>>>> Alvin Critchley [facility] >>>>> >>>>>  Board Certified in Internal Medicine, Adult Cardiovascular Medicine and >>>>> Critical Care Medicine >>>>> >>>>> The information in this electronic mail is sensitive, protected information >>>>> intended only for the addressee(s). Any other person, including anyone who >>>>> believes he/she might have received it due to an addressing error, is >>>>> requested >>>>> to notify the sender immediately and delete it without further reading or >>>>> retention. The information is not to be forwarded or shared unless in >>>>> compliance with [employer] on confidentiality and/or with >>>>> the approval of >>>>> the sender. >>>>> <Medtronic Eula Flax [email or web address]>

## 2011-05-21 NOTE — Progress Notes (Signed)
History of present illness:  The patient is a 77 year old male with a history of coronary artery disease, status post bypass surgery, status post bioprosthetic aortic valve replacement status post mitral valve repair. He had a prior history of pacemaker implantation Mr. bradycardia and postop atrial fibrillation termination pauses. In June the patient was admitted with infective endocarditis secondary to Enterococcus faecalis. It was felt that the pacemaker was a site of infection and this was removed. The patient was done several months without pacemaker until he developed presyncope and significant pauses and required reimplantation of a pacemaker with a Medtronic device in August of 2012. The patient previously has a history of paroxysmal atrial fibrillation and was maintained on amiodarone. In August apparently he was in normal sinus rhythm but now has reverted back into atrial fibrillation. He denies any presyncope or syncope. He does report lightheadedness. He also has noticed some lower extremity edema. 12 electrocardiogram today demonstrates atrial fibrillation with complete heart block with a junctional escape rhythm. The patient is currently on Coumadin but his INRs have not been particularly therapeutic. His latest INR however was 2.9.    Allergies, family history and social history: As documented in chart and reviewed  Medications: Documented and reviewed in chart.  Past medical history: Reviewed and see problem list below   Review of systems: No nausea or vomiting. No fever chills no melena hematochezia. No dysuria or frequency. No orthopnea PND. No visual changes.   Physical examination : Vital signs documented below General: Well-nourished white male with difficult gait HEENT: EOMI, PERRLA normal carotid upstroke no carotid bruits. No thyromegaly nonnodular thyroid Lungs: Clear breath sounds bilaterally. No crackles or wheezes Heart: Regular rate and rhythm with normal S1-S2  and no murmur rubs or gallops Abdomen: Soft nontender no rebound or guarding good bowel sounds. Extremity exam: No cyanosis clubbing or edema Neurologic: Alert and oriented and grossly nonfocal.  Psychiatric: Normal affect Vascular exam: Normal peripheral pulses bilaterally  Medtronic [facility] interrogation revealed that the patient was in permanent atrial fibrillation 12-lead electrocardiogram is as discussed above.

## 2011-05-21 NOTE — Assessment & Plan Note (Signed)
Normal pacemaker function. Pacemaker reinserted due to due symptomatic pauses.

## 2011-05-21 NOTE — Telephone Encounter (Signed)
No precert required 

## 2011-05-21 NOTE — Assessment & Plan Note (Signed)
No recurrent chest pain. Continue medical therapy. 

## 2011-05-21 NOTE — Assessment & Plan Note (Signed)
Normal pacemaker function. Patient is in atrial fibrillation with complete heart block this was confirmed by Medtronic interrogation

## 2011-05-21 NOTE — Assessment & Plan Note (Signed)
No evidence of volume overload continue medical therapy 

## 2011-05-21 NOTE — Assessment & Plan Note (Signed)
Recurrent atrial fibrillation. No evidence however of significant mitral regurgitation

## 2011-05-21 NOTE — Assessment & Plan Note (Signed)
No pathological murmurs

## 2011-05-21 NOTE — Assessment & Plan Note (Signed)
We'll proceed with increasing amiodarone to 200 mg a day and then proceed with TEE guided cardioversion as the patient's INRs have not been well-controlled recently

## 2011-05-22 DIAGNOSIS — I4891 Unspecified atrial fibrillation: Secondary | ICD-10-CM

## 2011-05-22 LAB — PROTIME-INR

## 2011-05-26 ENCOUNTER — Ambulatory Visit (INDEPENDENT_AMBULATORY_CARE_PROVIDER_SITE_OTHER): Payer: Medicare Other | Admitting: *Deleted

## 2011-05-26 DIAGNOSIS — I4892 Unspecified atrial flutter: Secondary | ICD-10-CM

## 2011-05-26 DIAGNOSIS — I4891 Unspecified atrial fibrillation: Secondary | ICD-10-CM

## 2011-05-26 LAB — POCT INR: INR: 2.2

## 2011-05-27 ENCOUNTER — Other Ambulatory Visit: Payer: Medicare Other | Admitting: *Deleted

## 2011-06-01 NOTE — Op Note (Signed)
NAME:  [name], [name] NO.:  [id number]  MEDICAL RECORD NO.:  [id number]  LOCATION:                                 FACILITY:  PHYSICIAN:  Doylene Canning. Ladona Ridgel, MD    DATE OF BIRTH:  1929/06/17  DATE OF PROCEDURE:  01/15/2011 DATE OF DISCHARGE:                              OPERATIVE REPORT   PROCEDURE PERFORMED:  Pacemaker system extraction.  INDICATION:  Enterococcus faecalis bacteremia.  INTRODUCTION:  The patient is a 77 year old male with a history of symptomatic tachybrady syndrome, who underwent permanent pacemaker insertion several years ago.  The patient developed enterococcal faecalis bacteremia and is now referred for pacemaker system extraction.  PROCEDURE:  After informed consent was obtained, the patient was taken to the operating room in a fasting state.  He had general anesthesia utilized to provide sedation.  Lidocaine 30 mL was infiltrated into the left infraclavicular region.  After usual preparation and draping, a 5- cm incision was carried out over the pacemaker insertion site pocket. Electrocautery was utilized to dissect down through the pacemaker pocket.  There was no obvious frank purulence in the pocket.  The Medtronic dual-chamber pacemaker, which was a model [id number] implanted in June 2009 was removed and the Medtronic 5076, atrial and ventricular leads were disconnected from the can.  Of note, the ventricular lead was actually a model 4076 lead.  Initial attention was then turned towards removal of the atrial lead.  The stylet was advanced into the lead and the helix was retracted in total.  Gentle traction was then placed on the lead and the lead was removed in total.  Following this, attention was then turned to the ventricular lead.  The stylet was advanced into the lead and the helix was again retracted.  With gentle traction, however, the lead did not become loose.  A liberator locking stylet was then advanced down the lead body all  the way to the lead tip.  With utilization of gentle traction, the lead remained adhered to the tissue. The 11-[facility] helical extraction sheath was advanced over the lead and into the subclavian vein.  With gentle traction, the entire lead was removed in total.  Pressure was then held.  Electrocautery was utilized to assure hemostasis.  The incision was then closed with 2-0 and 3-0 Vicryl.  The patient was not bradycardic and was returned to the recovery area in satisfactory condition.  COMPLICATIONS:  There are no immediate procedure complications.  RESULTS:  Demonstrate successful lead system and pacemaker system extraction in a patient with enterococcus faecalis bacteremia.     Doylene Canning. Ladona Ridgel, MD    GWT/MEDQ  D:  05/31/2011  T:  05/31/2011  Job:  [id number]

## 2011-06-05 ENCOUNTER — Ambulatory Visit (INDEPENDENT_AMBULATORY_CARE_PROVIDER_SITE_OTHER): Payer: Medicare Other | Admitting: *Deleted

## 2011-06-05 DIAGNOSIS — I4892 Unspecified atrial flutter: Secondary | ICD-10-CM

## 2011-06-05 DIAGNOSIS — I4891 Unspecified atrial fibrillation: Secondary | ICD-10-CM

## 2011-06-05 LAB — POCT INR: INR: 3.2

## 2011-06-11 ENCOUNTER — Encounter: Payer: BC Managed Care – PPO | Admitting: Internal Medicine

## 2011-06-12 ENCOUNTER — Ambulatory Visit (INDEPENDENT_AMBULATORY_CARE_PROVIDER_SITE_OTHER): Payer: Medicare Other | Admitting: *Deleted

## 2011-06-12 DIAGNOSIS — I4891 Unspecified atrial fibrillation: Secondary | ICD-10-CM

## 2011-06-12 DIAGNOSIS — I4892 Unspecified atrial flutter: Secondary | ICD-10-CM

## 2011-06-12 LAB — POCT INR: INR: 3

## 2011-06-16 ENCOUNTER — Ambulatory Visit (INDEPENDENT_AMBULATORY_CARE_PROVIDER_SITE_OTHER): Payer: Medicare Other | Admitting: *Deleted

## 2011-06-16 DIAGNOSIS — I4891 Unspecified atrial fibrillation: Secondary | ICD-10-CM

## 2011-06-16 DIAGNOSIS — I4892 Unspecified atrial flutter: Secondary | ICD-10-CM

## 2011-06-16 LAB — POCT INR: INR: 3.7

## 2011-06-25 ENCOUNTER — Other Ambulatory Visit: Payer: Self-pay | Admitting: *Deleted

## 2011-06-25 ENCOUNTER — Ambulatory Visit (INDEPENDENT_AMBULATORY_CARE_PROVIDER_SITE_OTHER): Payer: Medicare Other | Admitting: Internal Medicine

## 2011-06-25 ENCOUNTER — Encounter: Payer: Self-pay | Admitting: Internal Medicine

## 2011-06-25 DIAGNOSIS — I498 Other specified cardiac arrhythmias: Secondary | ICD-10-CM

## 2011-06-25 DIAGNOSIS — I4891 Unspecified atrial fibrillation: Secondary | ICD-10-CM

## 2011-06-25 DIAGNOSIS — Z950 Presence of cardiac pacemaker: Secondary | ICD-10-CM

## 2011-06-25 DIAGNOSIS — I10 Essential (primary) hypertension: Secondary | ICD-10-CM

## 2011-06-25 DIAGNOSIS — I4430 Unspecified atrioventricular block: Secondary | ICD-10-CM

## 2011-06-25 LAB — PACEMAKER DEVICE OBSERVATION
AL AMPLITUDE: 0.35 mv
AL IMPEDENCE PM: 495 Ohm
AL THRESHOLD: 3.75 V
ATRIAL PACING PM: 25
BATTERY VOLTAGE: 2.79 V
RV LEAD AMPLITUDE: 22.4 mv
RV LEAD IMPEDENCE PM: 577 Ohm
RV LEAD THRESHOLD: 0.75 V
VENTRICULAR PACING PM: 1

## 2011-06-25 MED ORDER — AMIODARONE HCL 200 MG PO TABS: 200.0000 mg | ORAL_TABLET | Freq: Every day | ORAL | Status: DC

## 2011-06-25 NOTE — Progress Notes (Signed)
Addended by: Murriel Hopper on: 06/25/2011 02:39 PM   Modules accepted: Orders

## 2011-06-25 NOTE — Progress Notes (Signed)
Addended by: Eustace Moore on: 06/25/2011 02:33 PM   Modules accepted: Orders

## 2011-06-25 NOTE — Assessment & Plan Note (Addendum)
Normal pacemaker function, though atrial lead pacing not capturing today at max output. Sensing from atrial channel seems ok.  Given h/o persistent afib as well as infection of device in past, I would be very reluctant to revise this lead!   Will obtain PA/lat chest X ray today See Pace Art report No changes today Return in 3 months

## 2011-06-25 NOTE — Assessment & Plan Note (Signed)
He has a h/o afib and atrial flutter,  He has been minimally symptomatic with these in the past. Continue coumadin long term Amiodarone,  Dr Andee Lineman to follow LFTs and TFTs

## 2011-06-25 NOTE — Assessment & Plan Note (Signed)
Stable No change required today  

## 2011-06-25 NOTE — Progress Notes (Signed)
PCP:  Mirian Mo, MD, MD  The patient presents today for routine electrophysiology followup.  Since last being seen in our clinic, the patient reports doing very well.  He was cardioverted from afib by Dr Andee Lineman in October. Today, he denies symptoms of palpitations, chest pain, shortness of breath, orthopnea, PND, lower extremity edema, dizziness, presyncope, syncope, or neurologic sequela.  The patient feels that he is tolerating medications without difficulties and is otherwise without complaint today.   Past Medical History  Diagnosis Date  . Symptomatic bradycardia   . Atrial flutter     Recurrent, left side. Amiodarone therapy, coumadin therapy, prior history of cardioversion and overdrive pacing; maintenance on normal sinus rhythm  . Cardiomyopathy     Mixed ischemic and nonischemic with previous EF of 35-40%, most recently in June 2009 normalized to 55-60%. Echo 2/11 EF 55-60%  . CAD (coronary artery disease)   . Valvular heart disease   . Endocarditis     s/p PPM extraction by Dr Ladona Ridgel 2012  . Atrial fibrillation   . History of Doppler echocardiogram     Less than 50% bilateral internal carotid artery stenosis by Doppler 2009  . Hypertension   . Benign prostatic hyperplasia   . Carpal tunnel syndrome   . Carotid artery stenosis    Past Surgical History  Procedure Date  . Coronary artery bypass graft   . Total hip arthroplasty   . Knee arthroscopy   . Carpal tunnel release   . Pacemaker insertion     initially implanted at [facility],  extracted by Dr Ladona Ridgel for endocarditisi and reimplanted 8/12  . Aortic valve replacement     Current Outpatient Prescriptions  Medication Sig Dispense Refill  . amiodarone (PACERONE) 200 MG tablet Take 0.5 tablets (100 mg total) by mouth daily.  45 tablet  3  . amLODipine (NORVASC) 10 MG tablet Take 10 mg by mouth daily.        Marland Kitchen aspirin 81 MG tablet Take 81 mg by mouth every other day.       . benazepril (LOTENSIN) 40 MG tablet Take 40 mg by  mouth daily.        . carvedilol (COREG) 3.125 MG tablet Take 3.125 mg by mouth 2 (two) times daily with a meal.        . furosemide (LASIX) 40 MG tablet Take 20 mg by mouth daily.        . Multiple Vitamins-Minerals (THEREMS M PO) Take 1 tablet by mouth daily.        . nitroGLYCERIN (NITROSTAT) 0.4 MG SL tablet Place 0.4 mg under the tongue every 5 (five) minutes as needed.        Marland Kitchen oxybutynin (DITROPAN) 5 MG tablet Take 5 mg by mouth at bedtime.        . potassium chloride SA (K-DUR,KLOR-CON) 20 MEQ tablet Take 20 mEq by mouth daily.        . pravastatin (PRAVACHOL) 40 MG tablet Take 40 mg by mouth daily.        . psyllium (METAMUCIL) 58.6 % packet Take 1 packet by mouth daily.        Marland Kitchen warfarin (COUMADIN) 3 MG tablet Take 3 mg by mouth daily.          No Known Allergies  History   Social History  . Marital Status: Married    Spouse Name: N/A    Number of Children: N/A  . Years of Education: N/A   Occupational History  .  Not on file.   Social History Main Topics  . Smoking status: Former Smoker -- 0.3 packs/day for 8 years    Types: Cigarettes    Quit date: 07/27/1954  . Smokeless tobacco: Never Used  . Alcohol Use: No  . Drug Use: Not on file  . Sexually Active: Not on file   Other Topics Concern  . Not on file   Social History Narrative  . No narrative on file    Family History  Problem Relation Age of Onset  . Diabetes Neg Hx   . Hypertension Neg Hx   . Coronary artery disease Neg Hx     ROS-  All systems are reviewed and are negative except as outlined in the HPI above   Physical Exam: Filed Vitals:   06/25/11 1350  BP: 106/74  Pulse: 74  Height: 5\' 7"  (1.702 m)  Weight: 181 lb (82.101 kg)  SpO2: 96%    GEN- The patient is well appearing, alert and oriented x 3 today.   Head- normocephalic, atraumatic Eyes-  Sclera clear, conjunctiva pink Ears- hearing intact Oropharynx- clear Neck- supple, no JVP Lymph- no cervical lymphadenopathy Lungs-  Clear to ausculation bilaterally, normal work of breathing Chest- pacemaker pocket is well healed, old L sided pocket also well healed Heart- Regular rate and rhythm, 2/6 SEM LUSB (early peaking) GI- soft, NT, ND, + BS Extremities- no clubbing, cyanosis, or edema  Pacemaker interrogation- reviewed in detail today,  See PACEART report  Assessment and Plan:

## 2011-07-07 ENCOUNTER — Other Ambulatory Visit: Payer: Self-pay | Admitting: Cardiology

## 2011-07-15 ENCOUNTER — Other Ambulatory Visit: Payer: Self-pay | Admitting: Cardiology

## 2011-07-28 HISTORY — PX: CATARACT EXTRACTION, BILATERAL: SHX1313

## 2011-09-15 ENCOUNTER — Other Ambulatory Visit: Payer: Self-pay | Admitting: Cardiology

## 2011-10-21 ENCOUNTER — Other Ambulatory Visit: Payer: Self-pay | Admitting: Cardiology

## 2011-10-26 ENCOUNTER — Encounter: Payer: Self-pay | Admitting: Internal Medicine

## 2011-11-03 ENCOUNTER — Telehealth: Payer: Self-pay | Admitting: *Deleted

## 2011-11-03 DIAGNOSIS — R609 Edema, unspecified: Secondary | ICD-10-CM

## 2011-11-03 DIAGNOSIS — R0602 Shortness of breath: Secondary | ICD-10-CM

## 2011-11-03 DIAGNOSIS — Z79899 Other long term (current) drug therapy: Secondary | ICD-10-CM

## 2011-11-03 NOTE — Telephone Encounter (Signed)
Wife Louie Casa) left messag on voicemail - question if she should give an extra Furosemide 20mg , states he has had a weight gain.  Attempted to return call - left message.

## 2011-11-03 NOTE — Telephone Encounter (Signed)
Has gained 4-5 lbs over the last week.  Takes Furosemide 20mg  daily, but wife increase to another pill 6 hours after that.  States that Dr. Andee Lineman told her to increase med the last time he was here.  Recently put on Iron for low blood.  No SOB that she can tell, but does get tired pretty easily.  Will forward to PA for review as GD is out of office x next 2 weeks.  Does have OV scheduled for 4/30 with Dr. Andee Lineman.

## 2011-11-04 ENCOUNTER — Other Ambulatory Visit: Payer: Self-pay | Admitting: *Deleted

## 2011-11-04 DIAGNOSIS — R0602 Shortness of breath: Secondary | ICD-10-CM

## 2011-11-04 DIAGNOSIS — Z79899 Other long term (current) drug therapy: Secondary | ICD-10-CM

## 2011-11-04 DIAGNOSIS — R609 Edema, unspecified: Secondary | ICD-10-CM

## 2011-11-04 NOTE — Telephone Encounter (Signed)
Agree with increase in Lasix to 40 mg daily. Recommend baseline BMET/BNP this week, then repeat BMET 1 week.

## 2011-11-04 NOTE — Telephone Encounter (Signed)
Wife notified of below.  Will fax order to [facility] for lab work.  Will do tomorrow or Friday.

## 2011-11-09 ENCOUNTER — Telehealth: Payer: Self-pay | Admitting: *Deleted

## 2011-11-09 DIAGNOSIS — R0602 Shortness of breath: Secondary | ICD-10-CM

## 2011-11-09 DIAGNOSIS — Z79899 Other long term (current) drug therapy: Secondary | ICD-10-CM

## 2011-11-09 DIAGNOSIS — R609 Edema, unspecified: Secondary | ICD-10-CM

## 2011-11-09 NOTE — Telephone Encounter (Signed)
Notes Recorded by Lesle Chris, LPN on 1/61/0960 at 4:43 PM Wife Louie Casa) notified of below. States she had been giving him the Lasix 40mg  in the morning & an extra 20mg  in the evneing. Advised her to do the Lasix 40mg  daily as instructed by Gene Serpe, PA-C & repeat BMET/BNP in one week. Patient has follow up scheduled with GD for 4/30. States she will call office back if have weight gain greater than 3 lbs.

## 2011-11-09 NOTE — Telephone Encounter (Signed)
Message copied by Murriel Hopper on Mon Nov 09, 2011  4:44 PM ------      Message from: Prescott Parma C      Created: Mon Nov 09, 2011 10:47 AM       Labs stable

## 2011-11-17 ENCOUNTER — Encounter: Payer: Self-pay | Admitting: *Deleted

## 2011-11-17 ENCOUNTER — Other Ambulatory Visit: Payer: Self-pay | Admitting: Cardiology

## 2011-11-24 ENCOUNTER — Encounter: Payer: Self-pay | Admitting: Cardiology

## 2011-11-24 ENCOUNTER — Ambulatory Visit (INDEPENDENT_AMBULATORY_CARE_PROVIDER_SITE_OTHER): Payer: Medicare Other | Admitting: Cardiology

## 2011-11-24 VITALS — BP 124/72 | HR 65 | Ht 66.0 in | Wt 185.0 lb

## 2011-11-24 DIAGNOSIS — Z09 Encounter for follow-up examination after completed treatment for conditions other than malignant neoplasm: Secondary | ICD-10-CM

## 2011-11-24 DIAGNOSIS — Z7901 Long term (current) use of anticoagulants: Secondary | ICD-10-CM

## 2011-11-24 DIAGNOSIS — Z9229 Personal history of other drug therapy: Secondary | ICD-10-CM

## 2011-11-24 DIAGNOSIS — Z8679 Personal history of other diseases of the circulatory system: Secondary | ICD-10-CM | POA: Insufficient documentation

## 2011-11-24 DIAGNOSIS — I059 Rheumatic mitral valve disease, unspecified: Secondary | ICD-10-CM

## 2011-11-24 DIAGNOSIS — R011 Cardiac murmur, unspecified: Secondary | ICD-10-CM

## 2011-11-24 DIAGNOSIS — R0602 Shortness of breath: Secondary | ICD-10-CM

## 2011-11-24 DIAGNOSIS — D649 Anemia, unspecified: Secondary | ICD-10-CM

## 2011-11-24 DIAGNOSIS — I779 Disorder of arteries and arterioles, unspecified: Secondary | ICD-10-CM

## 2011-11-24 DIAGNOSIS — Z954 Presence of other heart-valve replacement: Secondary | ICD-10-CM

## 2011-11-24 DIAGNOSIS — Z950 Presence of cardiac pacemaker: Secondary | ICD-10-CM

## 2011-11-24 DIAGNOSIS — I4892 Unspecified atrial flutter: Secondary | ICD-10-CM

## 2011-11-24 DIAGNOSIS — I480 Paroxysmal atrial fibrillation: Secondary | ICD-10-CM

## 2011-11-24 DIAGNOSIS — D509 Iron deficiency anemia, unspecified: Secondary | ICD-10-CM

## 2011-11-24 DIAGNOSIS — I38 Endocarditis, valve unspecified: Secondary | ICD-10-CM

## 2011-11-24 DIAGNOSIS — I4891 Unspecified atrial fibrillation: Secondary | ICD-10-CM

## 2011-11-24 DIAGNOSIS — I359 Nonrheumatic aortic valve disorder, unspecified: Secondary | ICD-10-CM

## 2011-11-24 DIAGNOSIS — I25810 Atherosclerosis of coronary artery bypass graft(s) without angina pectoris: Secondary | ICD-10-CM

## 2011-11-24 DIAGNOSIS — I6529 Occlusion and stenosis of unspecified carotid artery: Secondary | ICD-10-CM

## 2011-11-24 NOTE — Assessment & Plan Note (Signed)
Patient should remain on amiodarone because he has had recurrent atrial fibrillation even after his Cox-Maze procedure. Whether or not he is on amiodarone the patient will need to continue on warfarin unless he has major bleeding because of his increased risk for stroke.

## 2011-11-24 NOTE — Assessment & Plan Note (Signed)
Status post aortic valve replacement with a bioprosthesis normal auscultatory exam.

## 2011-11-24 NOTE — Assessment & Plan Note (Addendum)
Patient has a new murmur on examination. His murmur is consistent with mitral regurgitation and is graded 3/6. He also reports increased weakness and shortness of breath as well as some lower extremity edema. We will obtain an echocardiogram to reassess his mitral valve in particular.

## 2011-11-24 NOTE — Patient Instructions (Signed)
Your physician recommends that you go to the [facility] for lab work TODAY.  Echo - this week if possible If the results of your test are normal or stable, you will receive a letter.  If they are abnormal, the nurse will contact you by phone. Follow up in 4 weeks

## 2011-11-24 NOTE — Assessment & Plan Note (Signed)
Apparent iron deficiency anemia with low total iron and high TIBC. Patient had guaiac positive stools that were positive x2 and negative x1. He had a colonoscopy several years ago and according to his primary care physician does not need to be repeated. The patient has not noted any frank hematochezia or melena. Hemoglobin was 8.5. He is on very low dose iron supplementation only 65 mg twice a day. Ideally he should receive 325 mg by mouth 3 times a day. Currently the patient has associated constipation. Depending on the etiology of his anemia i.e. hemolytic anemia versus iron loss/GI loss the patient could benefit from intravenous iron with [name]. However we need to make sure that is not hemolyzing. I've ordered a reticulocyte count and haptoglobin as well as repeat CBC.

## 2011-11-24 NOTE — Assessment & Plan Note (Signed)
No evidence of endocarditis. Pocket size looks normal. Patient reports no fever or chills.

## 2011-11-24 NOTE — Progress Notes (Signed)
Peyton Bottoms, MD, [facility] Board Certified in Adult Cardiovascular Medicine,Internal Medicine and Critical Care Medicine    CC: Followup patient with a history of valvular heart disease status post by prostatic aortic valve and mitral valve repair and prior history of endocarditis with reinsertion of pacemaker  HPI: The patient is a 78 year old male with a complicated past medical history as delineated below. Most recently has been complaining of weakness and had blood work done in February of 2013. His hemoglobin was 8.5. He had 2 guaiac positive stools. He was not referred for repeat colonoscopy. Is not clear it is also hemolyzing. TIBC and iron levels were consistent with iron deficiency anemia. The patient however continues to complain of weakness and is only on iron replacement at 65 mg twice a day. He has noticed some swelling intermittently and his wife increased his Lasix from 40 a day to an additional dose of 20 mg in the evening. He denies however any chest pain he has no orthopnea PND. He has decreased exercise tolerance but this is mainly secondary to orthopedic problems. He reports significant constipation even with low dose iron. Is up 4 pounds from his last visit. He reports lower extremity edema although this is not very obvious on exam today. He reports no fever or chills.  PMH: reviewed and listed in Problem List in Electronic Records (and see below) Past Medical History  Diagnosis Date  . Symptomatic bradycardia   . Atrial flutter     Recurrent, left side. Amiodarone therapy, coumadin therapy, prior history of cardioversion and overdrive pacing; maintenance on normal sinus rhythm  . Cardiomyopathy     Mixed ischemic and nonischemic with previous EF of 35-40%, most recently in June 2009 normalized to 55-60%. Echo 2/11 EF 55-60%  . CAD (coronary artery disease)     Status post coronary bypass grafting  . Valvular heart disease     Status post by prostatic aortic valve and  mitral valve repair  . 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 Paroxysmal atrial fibrillation      amiodarone therapy, status post Cox-Maze procedure but with recurrent atrial fibrillation while on amiodarone August 2012  . Carotid artery disease     Less than 50% bilateral internal carotid artery stenosis by Doppler 2009  . Hypertension   . Benign prostatic hyperplasia   . Carpal tunnel syndrome   . Warfarin anticoagulation    Past Surgical History  Procedure Date  . Coronary artery bypass graft   . Total hip arthroplasty   . Knee arthroscopy   . Carpal tunnel release   . Pacemaker insertion     initially implanted at [facility],  extracted by Dr Ladona Ridgel for endocarditisi and reimplanted 8/12  . Aortic valve replacement     Allergies/SH/FHX : available in Electronic Records for review  No Known Allergies History   Social History  . Marital Status: Married    Spouse Name: N/A    Number of Children: N/A  . Years of Education: N/A   Occupational History  . Not on file.   Social History Main Topics  . Smoking status: Former Smoker -- 0.3 packs/day for 8 years    Types: Cigarettes    Quit date: 07/27/1954  . Smokeless tobacco: Never Used  . Alcohol Use: No  . Drug Use: Not on file  . Sexually Active: Not on file   Other Topics Concern  . Not on  file   Social History Narrative  . No narrative on file   Family History  Problem Relation Age of Onset  . Diabetes Neg Hx   . Hypertension Neg Hx   . Coronary artery disease Neg Hx     Medications: Current Outpatient Prescriptions  Medication Sig Dispense Refill  . amiodarone (PACERONE) 200 MG tablet Take 1 tablet (200 mg total) by mouth daily.  30 tablet  6  . amLODipine (NORVASC) 10 MG tablet TAKE 1 TABLET BY MOUTH ONCE A DAY  90 tablet  3  . aspirin 81 MG tablet Take 81 mg by mouth every other day.       . benazepril (LOTENSIN) 40 MG tablet TAKE 1 TABLET BY MOUTH  EVERY DAY  30 tablet  6  . carvedilol (COREG) 3.125 MG tablet Take 3.125 mg by mouth 2 (two) times daily with a meal.        . Ferrous Fumarate-Vitamin C (VITRON-C PO) Take 1 tablet by mouth 2 (two) times daily.      . furosemide (LASIX) 40 MG tablet Take 1 tablet (40 mg total) by mouth daily.  30 tablet  6  . Multiple Vitamins-Minerals (THEREMS M PO) Take 1 tablet by mouth daily.        . nitroGLYCERIN (NITROSTAT) 0.4 MG SL tablet Place 0.4 mg under the tongue every 5 (five) minutes as needed.        Marland Kitchen oxybutynin (DITROPAN) 5 MG tablet Take 5 mg by mouth at bedtime.        . potassium chloride SA (K-DUR,KLOR-CON) 20 MEQ tablet Take 20 mEq by mouth daily.        . pravastatin (PRAVACHOL) 40 MG tablet TAKE 1 TABLET BY MOUTH AT BEDTIME  30 tablet  6  . psyllium (METAMUCIL) 58.6 % packet Take 1 packet by mouth daily.        Marland Kitchen warfarin (COUMADIN) 3 MG tablet Take 3 mg by mouth daily.        Marland Kitchen DISCONTD: carvedilol (COREG) 6.25 MG tablet Take 0.5 tablets (3.125 mg total) by mouth 2 (two) times daily with a meal.  60 tablet  6    ROS: No nausea or vomiting. No fever or chills.No melena or hematochezia.No bleeding.No claudication  Physical Exam: BP 124/72  Pulse 65  Ht 5\' 6"  (1.676 m)  Wt 185 lb (83.915 kg)  BMI 29.86 kg/m2 General: Well-nourished white male slightly overweight, difficulty with walking Neck: Normal carotid upstroke and no carotid bruits. No thyromegaly nonnodular thyroid JVP is approximately 9 cm Lungs: Clear breath sounds bilaterally without crackles Cardiac: Regular rate and rhythm normal S1-S2 no definite S3. 2/6 Shenna decrescendo murmur at the right percent border and a 3/6 holosystolic murmur at the left lower sternal border and apex and radiating towards the axilla Vascular: No edema. Normal distal pulses Skin: Warm and dry Physcologic: Normal affect  12lead ECG: Single lead tracing shows normal sinus rhythm Limited bedside ECHO:N/A No images are attached to the  encounter.   Assessment and Plan  Valvular heart disease Patient has a new murmur on examination. His murmur is consistent with mitral regurgitation and is graded 3/6. He also reports increased weakness and shortness of breath as well as some lower extremity edema. We will obtain an echocardiogram to reassess his mitral valve in particular.  Anemia Apparent iron deficiency anemia with low total iron and high TIBC. Patient had guaiac positive stools that were positive x2 and negative x1. He had a colonoscopy  several years ago and according to his primary care physician does not need to be repeated. The patient has not noted any frank hematochezia or melena. Hemoglobin was 8.5. He is on very low dose iron supplementation only 65 mg twice a day. Ideally he should receive 325 mg by mouth 3 times a day. Currently the patient has associated constipation. Depending on the etiology of his anemia i.e. hemolytic anemia versus iron loss/GI loss the patient could benefit from intravenous iron with [name]. However we need to make sure that is not hemolyzing. I've ordered a reticulocyte count and haptoglobin as well as repeat CBC.  AORTIC VALVE REPLACEMENT, HX OF Status post aortic valve replacement with a bioprosthesis normal auscultatory exam.  Paroxysmal atrial fibrillation Patient should remain on amiodarone because he has had recurrent atrial fibrillation even after his Cox-Maze procedure. Whether or not he is on amiodarone the patient will need to continue on warfarin unless he has major bleeding because of his increased risk for stroke.  CAD, ARTERY BYPASS GRAFT Patient should remain on low-dose aspirin for graft patency  Pacemaker No evidence of endocarditis. Pocket size looks normal. Patient reports no fever or chills.    Patient Active Problem List  Diagnoses  . OBSTRUCTIVE SLEEP APNEA  . HYPERTENSION, UNSPECIFIED  . CAD, ARTERY BYPASS GRAFT  . CARDIOMYOPATHY, ISCHEMIC  . BRADYCARDIA  .  AORTIC VALVE REPLACEMENT, HX OF  . HYPERLIPIDEMIA  . Aortic valve disorders  . Warfarin anticoagulation  . Paroxysmal atrial fibrillation  . Valvular heart disease  . Atrial flutter  . Carotid artery disease  . Endocarditis  . Anemia  . Pacemaker

## 2011-11-24 NOTE — Assessment & Plan Note (Signed)
Patient should remain on low-dose aspirin for graft patency

## 2011-11-25 ENCOUNTER — Encounter: Payer: Self-pay | Admitting: *Deleted

## 2011-11-26 ENCOUNTER — Other Ambulatory Visit: Payer: Self-pay

## 2011-11-26 ENCOUNTER — Other Ambulatory Visit (INDEPENDENT_AMBULATORY_CARE_PROVIDER_SITE_OTHER): Payer: Medicare Other

## 2011-11-26 DIAGNOSIS — I4891 Unspecified atrial fibrillation: Secondary | ICD-10-CM

## 2011-11-26 DIAGNOSIS — R011 Cardiac murmur, unspecified: Secondary | ICD-10-CM

## 2011-11-26 DIAGNOSIS — I2510 Atherosclerotic heart disease of native coronary artery without angina pectoris: Secondary | ICD-10-CM

## 2011-11-26 DIAGNOSIS — R0602 Shortness of breath: Secondary | ICD-10-CM

## 2011-11-26 DIAGNOSIS — I059 Rheumatic mitral valve disease, unspecified: Secondary | ICD-10-CM

## 2011-11-30 ENCOUNTER — Encounter: Payer: Self-pay | Admitting: *Deleted

## 2011-12-23 ENCOUNTER — Encounter: Payer: Self-pay | Admitting: Cardiology

## 2011-12-23 ENCOUNTER — Ambulatory Visit (INDEPENDENT_AMBULATORY_CARE_PROVIDER_SITE_OTHER): Payer: Medicare Other | Admitting: Cardiology

## 2011-12-23 VITALS — BP 137/72 | HR 67 | Ht 66.0 in | Wt 187.0 lb

## 2011-12-23 DIAGNOSIS — I4891 Unspecified atrial fibrillation: Secondary | ICD-10-CM

## 2011-12-23 DIAGNOSIS — R609 Edema, unspecified: Secondary | ICD-10-CM | POA: Insufficient documentation

## 2011-12-23 DIAGNOSIS — D649 Anemia, unspecified: Secondary | ICD-10-CM

## 2011-12-23 DIAGNOSIS — I38 Endocarditis, valve unspecified: Secondary | ICD-10-CM

## 2011-12-23 DIAGNOSIS — I480 Paroxysmal atrial fibrillation: Secondary | ICD-10-CM

## 2011-12-23 MED ORDER — FUROSEMIDE 40 MG PO TABS: ORAL_TABLET | ORAL | Status: DC

## 2011-12-23 NOTE — Assessment & Plan Note (Addendum)
Mild iron deficiency anemia but hemoglobin improved to 11.3. The patient can continue on iron replacement. Clinically feels much improved. I asked him to followup with Dr. Criss Alvine regarding his anemia. There is no evidence of hemolysis with a normal haptoglobin.

## 2011-12-23 NOTE — Assessment & Plan Note (Signed)
Patient remains in normal sinus rhythm. His continued on amiodarone. He will need a TSH 3 times a year and this can be followed by Dr. Criss Alvine

## 2011-12-23 NOTE — Assessment & Plan Note (Signed)
Followup echocardiogram shows a normal bioprosthetic aortic valve and normal mitral valve repair. There is no aortic stenosis and no significant mitral regurgitation despite a holosystolic murmur that is heard at the apex and radiating to the axilla. The patient also has no heart failure symptoms.

## 2011-12-23 NOTE — Assessment & Plan Note (Signed)
The patient does have some lower extremity edema. He has no obvious left-sided heart failure, but we will increase his Lasix to 80 mg on Monday Wednesday Friday and 40 mg on the other days. I will ask Dr. Criss Alvine did check a BMET in 2 weeks to make sure that renal function and potassium are stable. His edema is resolved patient can cut back to 40 mg a day and weigh himself daily. His weight is up 3 pounds since his last office visit.

## 2011-12-23 NOTE — Assessment & Plan Note (Addendum)
No recurrent fevers or chills. 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

## 2011-12-23 NOTE — Progress Notes (Signed)
Riley Bottoms, MD, [facility] Board Certified in Adult Cardiovascular Medicine,Internal Medicine and Critical Care Medicine    CC: Follow patient with a history of aortic valve replacement and mitral valve repair. Followup after newly detected murmur left physical examination however in the setting of anemia  HPI:  The patient is doing much better than during the last office visit. He feels he has more energy and less short of breath. I did hear a 3/6 holosystolic murmur in the last office visit and we performed an echocardiogram. Interestingly there was no significant mitral regurgitation. By prostatic aortic valve appear to be functioning normal also. Ejection fraction is 60-65%. Murmur was heard in the setting of anemia. The patient also was found to have low total iron high TIBC and guaiac-positive stools x2. He has been on iron replacement therapy and this probably is followed by Dr. Criss Alvine. His hemoglobin in the interim has improved to 11.3. The patient feels much better. Echocardiogram also revealed a restrictive filling pattern possibly accounting for some degree of shortness of breath and lower extremity edema. The patient still has decreased level activity secondary to joint problems and now he also has problems with his right knee which may require surgery in the future. From a cardiac standpoint he appears stable otherwise. He reports no orthopnea or PND. He denies any palpitations presyncope or syncope.  PMH: reviewed and listed in Problem List in Electronic Records (and see below) Past Medical History  Diagnosis Date  . Symptomatic bradycardia   . Atrial flutter     Recurrent, left side. Amiodarone therapy, coumadin therapy, prior history of cardioversion and overdrive pacing; maintenance on normal sinus rhythm  . Cardiomyopathy     Mixed ischemic and nonischemic with previous EF of 35-40%, most recently in June 2009 normalized to 55-60%. Echo 2/11 EF 55-60%  . CAD (coronary  artery disease)     Status post coronary bypass grafting  . Valvular heart disease     Status post by prostatic aortic valve and mitral valve repair  . 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 Paroxysmal atrial fibrillation      amiodarone therapy, status post Cox-Maze procedure but with recurrent atrial fibrillation while on amiodarone August 2012  . Carotid artery disease     Less than 50% bilateral internal carotid artery stenosis by Doppler 2009  . Hypertension   . Benign prostatic hyperplasia   . Carpal tunnel syndrome   . Warfarin anticoagulation    Past Surgical History  Procedure Date  . Coronary artery bypass graft   . Total hip arthroplasty   . Knee arthroscopy   . Carpal tunnel release   . Pacemaker insertion     initially implanted at [facility],  extracted by Dr Ladona Ridgel for endocarditisi and reimplanted 8/12  . Aortic valve replacement     Allergies/SH/FHX : available in Electronic Records for review  No Known Allergies History   Social History  . Marital Status: Married    Spouse Name: N/A    Number of Children: N/A  . Years of Education: N/A   Occupational History  . Not on file.   Social History Main Topics  . Smoking status: Former Smoker -- 0.3 packs/day for 8 years    Types: Cigarettes    Quit date: 07/27/1954  . Smokeless tobacco: Never Used  . Alcohol Use: No  . Drug Use: Not on file  . Sexually Active: Not  on file   Other Topics Concern  . Not on file   Social History Narrative  . No narrative on file   Family History  Problem Relation Age of Onset  . Diabetes Neg Hx   . Hypertension Neg Hx   . Coronary artery disease Neg Hx     Medications: Current Outpatient Prescriptions  Medication Sig Dispense Refill  . amiodarone (PACERONE) 200 MG tablet Take 1 tablet (200 mg total) by mouth daily.  30 tablet  6  . amLODipine (NORVASC) 10 MG tablet TAKE 1 TABLET BY MOUTH ONCE A DAY  90 tablet   3  . aspirin 81 MG tablet Take 81 mg by mouth every other day.       . benazepril (LOTENSIN) 40 MG tablet TAKE 1 TABLET BY MOUTH EVERY DAY  30 tablet  6  . carvedilol (COREG) 3.125 MG tablet Take 3.125 mg by mouth 2 (two) times daily with a meal.        . Ferrous Fumarate-Vitamin C (VITRON-C PO) Take 1 tablet by mouth 2 (two) times daily.      . furosemide (LASIX) 40 MG tablet Take 2 tabs (80mg ) on Monday, Wednesday, and Friday & 1 tab (40mg ) all other days      . Multiple Vitamins-Minerals (THEREMS M PO) Take 1 tablet by mouth daily.        . nitroGLYCERIN (NITROSTAT) 0.4 MG SL tablet Place 0.4 mg under the tongue every 5 (five) minutes as needed.        Marland Kitchen oxybutynin (DITROPAN) 5 MG tablet Take 5 mg by mouth at bedtime.        . potassium chloride SA (K-DUR,KLOR-CON) 20 MEQ tablet Take 20 mEq by mouth daily.        . pravastatin (PRAVACHOL) 40 MG tablet TAKE 1 TABLET BY MOUTH AT BEDTIME  30 tablet  6  . psyllium (METAMUCIL) 58.6 % packet Take 1 packet by mouth daily.        Marland Kitchen warfarin (COUMADIN) 3 MG tablet Take 3 mg by mouth as directed. Managed by Dr. Criss Alvine      . DISCONTD: furosemide (LASIX) 40 MG tablet Take 1 tablet (40 mg total) by mouth daily.  30 tablet  6    ROS: No nausea or vomiting. No fever or chills.No melena or hematochezia.No bleeding.No claudication  Physical Exam: BP 137/72  Pulse 67  Ht 5\' 6"  (1.676 m)  Wt 187 lb (84.823 kg)  BMI 30.18 kg/m2 General: Well-nourished white male looking better than last office visit. Neck: Normal carotid upstroke and no carotid bruits bilaterally. No thyromegaly nonnodular thyroid. JVP is 7 cm at 45 angle Lungs: Clear breath sounds bilaterally without crackles or wheezes. Cardiac: Regular rate and rhythm with normal S1-S2. 2/6 outflow murmur at the right upper sternal border. Also a 2/6 holosystolic murmur towards the apex. Vascular: 1-2+ peripheral pitting edema. Normal distal pulses bilaterally Skin: Warm and dry Physcologic: Normal  affect  12lead ECG: Not EKG was obtained. Limited bedside ECHO:N/A No images are attached to the encounter.   Assessment and Plan  Valvular heart disease Followup echocardiogram shows a normal bioprosthetic aortic valve and normal mitral valve repair. There is no aortic stenosis and no significant mitral regurgitation despite a holosystolic murmur that is heard at the apex and radiating to the axilla. The patient also has no heart failure symptoms.  Paroxysmal atrial fibrillation Patient remains in normal sinus rhythm. His continued on amiodarone. He will need a TSH 3 times  a year and this can be followed by Dr. Criss Alvine  Endocarditis No recurrent fevers or chills. 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 He has no obvious left-sided heart failure, but we will increase his Lasix to 80 mg on Monday Wednesday Friday and 40 mg on the other days. I will ask Dr. Criss Alvine did check a BMET in 2 weeks to make sure that renal function and potassium are stable. His edema is resolved patient can cut back to 40 mg a day and weigh himself daily. His weight is up 3 pounds since his last office visit.  Anemia Mild iron deficiency anemia but hemoglobin improved to 11.3. The patient can continue on iron replacement. Clinically feels much improved. I asked him to followup with Dr. Criss Alvine regarding his anemia. There is no evidence of hemolysis with a normal haptoglobin.

## 2011-12-23 NOTE — Patient Instructions (Signed)
   Increase Lasix to 80mg  on Monday, Wednesday, Friday & 40mg  on all other days  Follow up BMET with Dr. Criss Alvine in 2 weeks Your physician wants you to follow up in: 6 months.  You will receive a reminder letter in the mail one-two months in advance.  If you don't receive a letter, please call our office to schedule the follow up appointment

## 2012-01-25 ENCOUNTER — Other Ambulatory Visit: Payer: Self-pay | Admitting: Cardiology

## 2012-02-06 ENCOUNTER — Other Ambulatory Visit: Payer: Self-pay | Admitting: Cardiology

## 2012-02-08 ENCOUNTER — Other Ambulatory Visit: Payer: Self-pay | Admitting: Cardiology

## 2012-02-16 ENCOUNTER — Other Ambulatory Visit: Payer: Self-pay | Admitting: *Deleted

## 2012-02-16 MED ORDER — FUROSEMIDE 40 MG PO TABS: ORAL_TABLET | ORAL | Status: DC

## 2012-02-18 ENCOUNTER — Other Ambulatory Visit: Payer: Self-pay | Admitting: Cardiology

## 2012-02-19 ENCOUNTER — Other Ambulatory Visit: Payer: Self-pay | Admitting: Cardiology

## 2012-02-20 ENCOUNTER — Other Ambulatory Visit: Payer: Self-pay | Admitting: Cardiology

## 2012-02-22 ENCOUNTER — Other Ambulatory Visit: Payer: Self-pay | Admitting: Cardiology

## 2012-03-16 ENCOUNTER — Ambulatory Visit (INDEPENDENT_AMBULATORY_CARE_PROVIDER_SITE_OTHER): Payer: Medicare Other | Admitting: Internal Medicine

## 2012-03-16 ENCOUNTER — Encounter: Payer: Self-pay | Admitting: Internal Medicine

## 2012-03-16 VITALS — BP 119/71 | HR 63 | Ht 66.0 in | Wt 185.0 lb

## 2012-03-16 DIAGNOSIS — I498 Other specified cardiac arrhythmias: Secondary | ICD-10-CM

## 2012-03-16 DIAGNOSIS — I4892 Unspecified atrial flutter: Secondary | ICD-10-CM

## 2012-03-16 DIAGNOSIS — I480 Paroxysmal atrial fibrillation: Secondary | ICD-10-CM

## 2012-03-16 DIAGNOSIS — I4891 Unspecified atrial fibrillation: Secondary | ICD-10-CM

## 2012-03-16 LAB — PACEMAKER DEVICE OBSERVATION
AL AMPLITUDE: 1 mv
AL IMPEDENCE PM: 510 Ohm
AL THRESHOLD: 0.75 V
ATRIAL PACING PM: 46
BATTERY VOLTAGE: 2.79 V
RV LEAD AMPLITUDE: 16 mv
RV LEAD IMPEDENCE PM: 505 Ohm
RV LEAD THRESHOLD: 0.75 V
VENTRICULAR PACING PM: 25

## 2012-03-16 NOTE — Progress Notes (Signed)
PCP:  Mirian Mo, MD Primary Cardiologist:  Dr Andee Lineman  The patient presents today for routine electrophysiology followup.  Since last being seen in our clinic, the patient reports doing very well.  His primarily limitation is due to knee pain.  He has stable edema.   He reports rare episodes of dizziness which is mild.  Today, he denies symptoms of palpitations, chest pain, shortness of breath, orthopnea, PND, presyncope, syncope, or neurologic sequela.  The patient feels that he is tolerating medications without difficulties and is otherwise without complaint today.   Past Medical History  Diagnosis Date  . Symptomatic bradycardia   . Atrial flutter     Recurrent, left side. Amiodarone therapy, coumadin therapy, prior history of cardioversion and overdrive pacing; maintenance on normal sinus rhythm  . Cardiomyopathy     Mixed ischemic and nonischemic with previous EF of 35-40%, most recently in June 2009 normalized to 55-60%. Echo 2/11 EF 55-60%  . CAD (coronary artery disease)     Status post coronary bypass grafting  . Valvular heart disease     Status post by prostatic aortic valve and mitral valve repair  . 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 Paroxysmal atrial fibrillation      amiodarone therapy, status post Cox-Maze procedure but with recurrent atrial fibrillation while on amiodarone August 2012  . Carotid artery disease     Less than 50% bilateral internal carotid artery stenosis by Doppler 2009  . Hypertension   . Benign prostatic hyperplasia   . Carpal tunnel syndrome   . Warfarin anticoagulation    Past Surgical History  Procedure Date  . Coronary artery bypass graft 2008    x2  . Total hip arthroplasty     began at age 42, replacement age 72, age 52  . Knee arthroscopy   . Carpal tunnel release 2009  . Pacemaker insertion 2009    initially implanted at [facility],  extracted by Dr Ladona Ridgel for endocarditisi and  reimplanted 8/12  . Aortic valve replacement   . Tonsillectomy 1939  . Hernia repair age 26  . Patella fracture surgery age 57  . Cardiac catheterization 2007, 2008, 2010  . Cardio version 2009    x2 april, june  . Cataract extraction, bilateral 07/2011    Current Outpatient Prescriptions  Medication Sig Dispense Refill  . amLODipine (NORVASC) 10 MG tablet TAKE 1 TABLET BY MOUTH ONCE A DAY  90 tablet  3  . aspirin 81 MG tablet Take 81 mg by mouth every other day.       . benazepril (LOTENSIN) 40 MG tablet TAKE 1 TABLET BY MOUTH EVERY DAY  30 tablet  6  . carvedilol (COREG) 3.125 MG tablet Take 3.125 mg by mouth 2 (two) times daily with a meal.        . Ferrous Fumarate-Vitamin C (VITRON-C PO) Take 1 tablet by mouth 2 (two) times daily.      . furosemide (LASIX) 40 MG tablet Take 2 tabs (80mg ) on Monday, Wednesday, and Friday & 1 tab (40mg ) all other days  50 tablet  6  . KLOR-CON M20 20 MEQ tablet TAKE 1 TABLET BY MOUTH TWICE A DAY  60 tablet  6  . Multiple Vitamins-Minerals (THEREMS M PO) Take 1 tablet by mouth daily.        . nitroGLYCERIN (NITROSTAT) 0.4 MG SL tablet Place 0.4 mg under the tongue every 5 (five) minutes as needed.        Marland Kitchen  oxybutynin (DITROPAN) 5 MG tablet Take 5 mg by mouth at bedtime.        Marland Kitchen PACERONE 200 MG tablet TAKE 1 TABLET BY MOUTH EVERY DAY  30 tablet  6  . pravastatin (PRAVACHOL) 40 MG tablet TAKE 1 TABLET BY MOUTH AT BEDTIME (NEEDS CHOLESTEROL LABS SENT TO DOCTORS OFFICE)  30 tablet  6  . psyllium (METAMUCIL) 58.6 % packet Take 1 packet by mouth daily.        Marland Kitchen warfarin (COUMADIN) 3 MG tablet Take 3 mg by mouth as directed. Managed by Dr. Criss Alvine      . DISCONTD: potassium chloride SA (K-DUR,KLOR-CON) 20 MEQ tablet Take 20 mEq by mouth daily.          No Known Allergies  History   Social History  . Marital Status: Married    Spouse Name: N/A    Number of Children: N/A  . Years of Education: N/A   Occupational History  . Not on file.   Social  History Main Topics  . Smoking status: Former Smoker -- 0.3 packs/day for 8 years    Types: Cigarettes    Quit date: 07/27/1954  . Smokeless tobacco: Never Used  . Alcohol Use: No  . Drug Use: Not on file  . Sexually Active: Not on file   Other Topics Concern  . Not on file   Social History Narrative  . No narrative on file    Family History  Problem Relation Age of Onset  . Diabetes Neg Hx   . Hypertension Neg Hx   . Coronary artery disease Neg Hx   . Cancer Mother   . Other Father     heart attack or stroke   Physical Exam: Filed Vitals:   03/16/12 1015  BP: 119/71  Pulse: 63  Height: 5\' 6"  (1.676 m)  Weight: 185 lb (83.915 kg)    GEN- The patient is well appearing, alert and oriented x 3 today.   Head- normocephalic, atraumatic Eyes-  Sclera clear, conjunctiva pink Ears- hearing intact Oropharynx- clear Neck- supple, no JVP Lymph- no cervical lymphadenopathy Lungs- Clear to ausculation bilaterally, normal work of breathing Chest- pacemaker pocket is well healed, old L sided pocket also well healed Heart- Regular rate and rhythm, 2/6 SEM LUSB (early peaking) GI- soft, NT, ND, + BS Extremities- no clubbing, cyanosis, trace edema  Pacemaker interrogation- reviewed in detail today,  See PACEART report  Assessment and Plan:

## 2012-03-16 NOTE — Patient Instructions (Addendum)
Your physician recommends that you schedule a follow-up appointment in: 1 year with Dr. Johney Frame. You will receive a reminder letter in the mail in about 10 months reminding you to call and schedule your appointment. If you don't receive this letter, please contact our office.  [facility] device check every 3 months. Your physician recommends that you continue on your current medications as directed. Please refer to the Current Medication list given to you today.

## 2012-03-16 NOTE — Assessment & Plan Note (Signed)
Maintaining sinus rhythm with amiodarone Continue coumadin long term  The importance of following TFTs/LFTs was discussed today.  He wishes to have these followed twice per year by Dr Criss Alvine. No changes today

## 2012-03-16 NOTE — Assessment & Plan Note (Signed)
Normal pacemaker function Atrial threshold was previously elevated, though today it appears to be 1V @0 . He has very frequent atrial ectopy, though afib appears controlled See Arita Miss Art report No changes today

## 2012-06-20 ENCOUNTER — Encounter: Payer: Medicare Other | Admitting: *Deleted

## 2012-06-28 ENCOUNTER — Encounter: Payer: Self-pay | Admitting: *Deleted

## 2012-08-12 ENCOUNTER — Encounter: Payer: Self-pay | Admitting: Cardiology

## 2012-08-19 ENCOUNTER — Encounter: Payer: Self-pay | Admitting: *Deleted

## 2012-08-23 ENCOUNTER — Other Ambulatory Visit: Payer: Self-pay | Admitting: Cardiology

## 2012-09-07 ENCOUNTER — Other Ambulatory Visit: Payer: Self-pay | Admitting: Cardiology

## 2012-09-14 ENCOUNTER — Telehealth: Payer: Self-pay | Admitting: Internal Medicine

## 2012-09-14 NOTE — Telephone Encounter (Signed)
PT'S WIFE CALLING RE NO SHOW REMOTE CHECK LETTER, PT IS SEEING DR DEGENT IN [place], HAD DEVICE CHECKED IN [place]

## 2012-09-15 ENCOUNTER — Other Ambulatory Visit: Payer: Self-pay | Admitting: Cardiology

## 2012-10-02 ENCOUNTER — Other Ambulatory Visit: Payer: Self-pay | Admitting: Cardiology

## 2012-10-03 ENCOUNTER — Other Ambulatory Visit: Payer: Self-pay | Admitting: Cardiology

## 2012-10-11 ENCOUNTER — Other Ambulatory Visit: Payer: Self-pay | Admitting: Physician Assistant

## 2012-11-21 ENCOUNTER — Ambulatory Visit (INDEPENDENT_AMBULATORY_CARE_PROVIDER_SITE_OTHER): Payer: Medicare Other | Admitting: *Deleted

## 2012-11-21 ENCOUNTER — Encounter: Payer: Self-pay | Admitting: Internal Medicine

## 2012-11-21 ENCOUNTER — Other Ambulatory Visit: Payer: Self-pay | Admitting: Internal Medicine

## 2012-11-21 ENCOUNTER — Other Ambulatory Visit: Payer: Self-pay | Admitting: Physician Assistant

## 2012-11-21 DIAGNOSIS — I498 Other specified cardiac arrhythmias: Secondary | ICD-10-CM

## 2012-11-21 DIAGNOSIS — Z950 Presence of cardiac pacemaker: Secondary | ICD-10-CM

## 2012-11-27 LAB — REMOTE PACEMAKER DEVICE
AL AMPLITUDE: 1 mv
AL IMPEDENCE PM: 429 Ohm
ATRIAL PACING PM: 41
BATTERY VOLTAGE: 2.79 V
BRDY-0002RV: 60 {beats}/min
RV LEAD IMPEDENCE PM: 442 Ohm
RV LEAD THRESHOLD: 1.25 V
VENTRICULAR PACING PM: 82

## 2012-11-29 ENCOUNTER — Other Ambulatory Visit: Payer: Self-pay | Admitting: Physician Assistant

## 2012-12-02 ENCOUNTER — Telehealth: Payer: Self-pay | Admitting: Cardiology

## 2012-12-02 NOTE — Telephone Encounter (Signed)
PRAVASTATIN SODIUM 40 MG PO TABS   patient is requesting refill. States she called the pharmacy and was told to call the office

## 2012-12-05 ENCOUNTER — Encounter: Payer: Self-pay | Admitting: *Deleted

## 2012-12-05 ENCOUNTER — Other Ambulatory Visit: Payer: Self-pay | Admitting: Cardiology

## 2013-01-09 ENCOUNTER — Telehealth: Payer: Self-pay | Admitting: Cardiology

## 2013-01-09 ENCOUNTER — Encounter: Payer: Self-pay | Admitting: Cardiology

## 2013-01-09 ENCOUNTER — Ambulatory Visit (INDEPENDENT_AMBULATORY_CARE_PROVIDER_SITE_OTHER): Payer: Medicare Other | Admitting: Cardiology

## 2013-01-09 VITALS — BP 143/74 | HR 60 | Ht 67.5 in | Wt 193.0 lb

## 2013-01-09 DIAGNOSIS — I4891 Unspecified atrial fibrillation: Secondary | ICD-10-CM

## 2013-01-09 DIAGNOSIS — I4892 Unspecified atrial flutter: Secondary | ICD-10-CM

## 2013-01-09 DIAGNOSIS — I480 Paroxysmal atrial fibrillation: Secondary | ICD-10-CM

## 2013-01-09 DIAGNOSIS — R609 Edema, unspecified: Secondary | ICD-10-CM

## 2013-01-09 DIAGNOSIS — I38 Endocarditis, valve unspecified: Secondary | ICD-10-CM

## 2013-01-09 DIAGNOSIS — I359 Nonrheumatic aortic valve disorder, unspecified: Secondary | ICD-10-CM

## 2013-01-09 MED ORDER — MEDICAL COMPRESSION STOCKINGS MISC: 1.0000 | Status: DC

## 2013-01-09 MED ORDER — POTASSIUM CHLORIDE CRYS ER 20 MEQ PO TBCR: 20.0000 meq | EXTENDED_RELEASE_TABLET | Freq: Every day | ORAL | Status: DC

## 2013-01-09 MED ORDER — FUROSEMIDE 80 MG PO TABS: 80.0000 mg | ORAL_TABLET | Freq: Every day | ORAL | Status: DC

## 2013-01-09 NOTE — Progress Notes (Signed)
HPI The patient was added to my schedule today for evaluation of lower extremity swelling and weight gain. He's had about an 8 pound weight gain over the last couple of weeks. There may be some increased dyspnea but he's been chronically short of breath walking a few yards on level ground. He's not describing any PND or orthopnea. He doesn't describe any new palpitations, presyncope or syncope. He's not had any new chest discomfort, neck or arm discomfort. However, chronic lower extremity swelling has been worse. He does report he watches his fluid and salt. Of note his hemoglobin which has been intermittently low is currently down to 8.5. It was greater than 11 earlier this year but was 8.5 last year. He has had guaiac positive stools recently but apparently this is not new. He's been evaluated with a haptoglobin last year and was not thought to have hemolysis. He does have valve replacements as described below. His last echocardiogram demonstrated normal valve function and a preserved ejection fraction.  No Known Allergies  Current Outpatient Prescriptions  Medication Sig Dispense Refill  . amLODipine (NORVASC) 10 MG tablet TAKE 1 TABLET BY MOUTH ONCE A DAY  30 tablet  0  . benazepril (LOTENSIN) 40 MG tablet TAKE 1 TABLET BY MOUTH EVERY DAY  30 tablet  3  . carvedilol (COREG) 3.125 MG tablet Take 3.125 mg by mouth 2 (two) times daily with a meal.        . Ferrous Fumarate-Vitamin C (VITRON-C PO) Take 1 tablet by mouth 2 (two) times daily.      . furosemide (LASIX) 40 MG tablet TAKE 2 TABS (80MG ) ON MONDAY, WEDNESDAY, AND FRIDAY & 1 TAB (40MG ) ALL OTHER DAYS  30 tablet  0  . Multiple Vitamins-Minerals (THEREMS M PO) Take 1 tablet by mouth daily.        . nitroGLYCERIN (NITROSTAT) 0.4 MG SL tablet Place 0.4 mg under the tongue every 5 (five) minutes as needed.        Marland Kitchen PACERONE 200 MG tablet TAKE 1 TABLET BY MOUTH EVERY DAY NEED TO MAKE APPOINTMENT  30 tablet  0  . potassium chloride SA (KLOR-CON  M20) 20 MEQ tablet       . pravastatin (PRAVACHOL) 40 MG tablet TAKE 1 TABLET BY MOUTH AT BEDTIME (NEEDS CHOLESTEROL LABS SENT TO DOCTORS OFFICE)  30 tablet  1  . psyllium (METAMUCIL) 58.6 % packet Take 1 packet by mouth daily.        Marland Kitchen warfarin (COUMADIN) 3 MG tablet Take 3 mg by mouth as directed. Managed by Dr. Criss Alvine       No current facility-administered medications for this visit.    Past Medical History  Diagnosis Date  . Symptomatic bradycardia   . Atrial flutter     Recurrent, left side. Amiodarone therapy, coumadin therapy, prior history of cardioversion and overdrive pacing; maintenance on normal sinus rhythm  . Cardiomyopathy     Mixed ischemic and nonischemic with previous EF of 35-40%, most recently in June 2009 normalized to 55-60%. Echo 2/11 EF 55-60%  . CAD (coronary artery disease)     Status post coronary bypass grafting  . Valvular heart disease     Status post by prostatic aortic valve and mitral valve repair  . 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 Paroxysmal atrial fibrillation      amiodarone therapy, status post Cox-Maze procedure but with recurrent atrial fibrillation while on  amiodarone August 2012  . Carotid artery disease     Less than 50% bilateral internal carotid artery stenosis by Doppler 2009  . Hypertension   . Benign prostatic hyperplasia   . Carpal tunnel syndrome   . Warfarin anticoagulation     Past Surgical History  Procedure Laterality Date  . Coronary artery bypass graft  2008    x2  . Total hip arthroplasty      began at age 54, replacement age 55, age 83  . Knee arthroscopy    . Carpal tunnel release  2009  . Pacemaker insertion  2009    initially implanted at [facility],  extracted by Dr Ladona Ridgel for endocarditisi and reimplanted 8/12  . Aortic valve replacement    . Tonsillectomy  1939  . Hernia repair  age 25  . Patella fracture surgery  age 42  . Cardiac catheterization   2007, 2008, 2010  . Cardio version  2009    x2 april, june  . Cataract extraction, bilateral  07/2011    ROS:  As stated in the HPI and negative for all other systems.  PHYSICAL EXAM BP 143/74  Pulse 60  Ht 5' 7.5" (1.715 m)  Wt 193 lb (87.544 kg)  BMI 29.76 kg/m2 GENERAL:  Well appearing HEENT:  Pupils equal round and reactive, fundi not visualized, oral mucosa unremarkable NECK:  Jugular venous distention to the jaw at 45 with a CV wave, waveform within normal limits, carotid upstroke brisk and symmetric, no bruits, no thyromegaly LYMPHATICS:  No cervical, inguinal adenopathy LUNGS:  Clear to auscultation bilaterally BACK:  No CVA tenderness CHEST:  Well healed right sided pacemaker HEART:  PMI not displaced or sustained,S1 and S2 within normal limits, no S3, no S4, no clicks, no rubs, 3/6 holosystolic murmur heard at the apex and over the entire precordium ABD:  Flat, positive bowel sounds normal in frequency in pitch, no bruits, no rebound, no guarding, no midline pulsatile mass, no hepatomegaly, no splenomegaly EXT:  2 plus pulses throughout, moderately severe bilateral lower extremity edema, no cyanosis no clubbing SKIN:  No rashes no nodules NEURO:  Cranial nerves II through XII grossly intact, motor grossly intact throughout [facility]:  Cognitively intact, oriented to person place and time   EKG:  Sinus rhythm with marked prolongation of the PR interval, ventricular pacing, rate 60 01/09/2013  ASSESSMENT AND PLAN  Valvular heart disease  Given the ongoing edema I will followup with a repeat echocardiogram. Further evaluation and management will be based on this.  Paroxysmal atrial fibrillation  Patient remains in normal sinus rhythm. He will continue his amiodarone. Of note on his recent pacemaker check there appeared to be only limited atrial fibrillation. He apparently does have his TSH and liver enzymes checked routinely by Dr. Criss Alvine  Endocarditis  No recurrent fevers  or chills. 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 He's going to take 80 mg of Lasix 80 every day for the next week with an increased 20 mEq of potassium. He will then go back to his previous regimen. He is given a prescription for compression stockings and instructions on how to keep his feet elevated.  Anemia  This has been an ongoing problem. In particular he was noted in the past to have a normal haptoglobin and no suggestion that this was a hemolysis related to his valve disease. He has guaiac positive stools. His hemoglobin has  fluctuated over many months from a 8.5 up to 11 and now back down. He has had his iron adjusted. This of course will need to be followed closely by Dr. Criss Alvine but at this time no GI evaluation is planned.  Pacemaker I did review the most recent home pacemaker check.  The plan was to have his AV interval adjusted in the clinic when he comes back. I will move this appointment up.

## 2013-01-09 NOTE — Patient Instructions (Addendum)
Your physician recommends that you schedule a follow-up appointment in: as scheduled. Your physician has recommended you make the following change in your medication: Take furosemide 80 mg daily for one week. Take an extra potassium chloride 20 meq for one week. Your new prescriptions have been sent to your pharmacy. All other medications will remain the same. Your physician has requested that you have an echocardiogram. Echocardiography is a painless test that uses sound waves to create images of your heart. It provides your doctor with information about the size and shape of your heart and how well your heart's chambers and valves are working. This procedure takes approximately one hour. There are no restrictions for this procedure. You have been given a prescription for compression stockings. Please keep your feet elevated. Follow up with the device clinic.

## 2013-01-09 NOTE — Telephone Encounter (Signed)
Spoke with wife to recommend patient seeing PCP for weight gain and she states that patient has already seen his PCP and it was advised that they f/u with cardiologist. Wife requesting sooner appointment with provider. Nurse informed wife that patient currently had first available appointment and she would be called back if a sooner appointment became available.

## 2013-01-09 NOTE — Telephone Encounter (Signed)
Patient has had recent weight gain of 4 pounds.  Wife is very concerned over this.  Eating habits have not changed

## 2013-01-12 ENCOUNTER — Other Ambulatory Visit: Payer: Self-pay

## 2013-01-12 ENCOUNTER — Other Ambulatory Visit (INDEPENDENT_AMBULATORY_CARE_PROVIDER_SITE_OTHER): Payer: Medicare Other

## 2013-01-12 DIAGNOSIS — R064 Hyperventilation: Secondary | ICD-10-CM

## 2013-01-12 DIAGNOSIS — I480 Paroxysmal atrial fibrillation: Secondary | ICD-10-CM

## 2013-01-12 DIAGNOSIS — I38 Endocarditis, valve unspecified: Secondary | ICD-10-CM

## 2013-01-12 DIAGNOSIS — R609 Edema, unspecified: Secondary | ICD-10-CM

## 2013-01-12 DIAGNOSIS — I359 Nonrheumatic aortic valve disorder, unspecified: Secondary | ICD-10-CM

## 2013-01-13 ENCOUNTER — Ambulatory Visit (INDEPENDENT_AMBULATORY_CARE_PROVIDER_SITE_OTHER): Payer: Medicare Other | Admitting: *Deleted

## 2013-01-13 DIAGNOSIS — I4892 Unspecified atrial flutter: Secondary | ICD-10-CM

## 2013-01-13 DIAGNOSIS — I498 Other specified cardiac arrhythmias: Secondary | ICD-10-CM

## 2013-01-13 LAB — PACEMAKER DEVICE OBSERVATION
AL AMPLITUDE: 0.35 mv
AL IMPEDENCE PM: 419 Ohm
ATRIAL PACING PM: 34
BATTERY VOLTAGE: 2.78 V
BRDY-0002RV: 60 {beats}/min
RV LEAD AMPLITUDE: 4 mv
RV LEAD IMPEDENCE PM: 436 Ohm
RV LEAD THRESHOLD: 1.25 V
VENTRICULAR PACING PM: 69

## 2013-01-13 NOTE — Progress Notes (Signed)
PPM check in office. 

## 2013-01-19 ENCOUNTER — Telehealth: Payer: Self-pay | Admitting: *Deleted

## 2013-01-19 NOTE — Telephone Encounter (Signed)
Message copied by Eustace Moore on Thu Jan 19, 2013  9:35 AM ------      Message from: FLEMING, PAMELA J      Created: Mon Jan 16, 2013 12:12 PM                   ----- Message -----         From: Rollene Rotunda, MD         Sent: 01/15/2013   9:46 PM           To: Rocco Serene, RN            He does have TR and elevated pulmonary pressures.  This will in part explain his edema.  Continue therapy as prescribed in the previous note.  Follow up in 6 weeks.  Call Mr. Hast with the results and send results to Mirian Mo, MD       ------

## 2013-01-19 NOTE — Telephone Encounter (Signed)
Wife informed

## 2013-01-31 ENCOUNTER — Encounter: Payer: Self-pay | Admitting: Cardiology

## 2013-01-31 ENCOUNTER — Ambulatory Visit (INDEPENDENT_AMBULATORY_CARE_PROVIDER_SITE_OTHER): Payer: Medicare Other | Admitting: Cardiology

## 2013-01-31 VITALS — BP 123/64 | HR 60 | Ht 67.0 in | Wt 188.0 lb

## 2013-01-31 DIAGNOSIS — I10 Essential (primary) hypertension: Secondary | ICD-10-CM

## 2013-01-31 DIAGNOSIS — I480 Paroxysmal atrial fibrillation: Secondary | ICD-10-CM

## 2013-01-31 DIAGNOSIS — I2510 Atherosclerotic heart disease of native coronary artery without angina pectoris: Secondary | ICD-10-CM

## 2013-01-31 DIAGNOSIS — I38 Endocarditis, valve unspecified: Secondary | ICD-10-CM

## 2013-01-31 DIAGNOSIS — R609 Edema, unspecified: Secondary | ICD-10-CM

## 2013-01-31 DIAGNOSIS — I4891 Unspecified atrial fibrillation: Secondary | ICD-10-CM

## 2013-01-31 DIAGNOSIS — I429 Cardiomyopathy, unspecified: Secondary | ICD-10-CM

## 2013-01-31 MED ORDER — BENAZEPRIL HCL 40 MG PO TABS: 40.0000 mg | ORAL_TABLET | Freq: Every day | ORAL | Status: DC

## 2013-01-31 MED ORDER — AMIODARONE HCL 200 MG PO TABS: 200.0000 mg | ORAL_TABLET | Freq: Every day | ORAL | Status: DC

## 2013-01-31 MED ORDER — CARVEDILOL 3.125 MG PO TABS: 3.1250 mg | ORAL_TABLET | Freq: Two times a day (BID) | ORAL | Status: DC

## 2013-01-31 MED ORDER — POTASSIUM CHLORIDE CRYS ER 20 MEQ PO TBCR: 20.0000 meq | EXTENDED_RELEASE_TABLET | Freq: Every day | ORAL | Status: DC

## 2013-01-31 MED ORDER — AMLODIPINE BESYLATE 10 MG PO TABS: 10.0000 mg | ORAL_TABLET | Freq: Every day | ORAL | Status: DC

## 2013-01-31 MED ORDER — NITROGLYCERIN 0.4 MG SL SUBL: 0.4000 mg | SUBLINGUAL_TABLET | SUBLINGUAL | Status: DC | PRN

## 2013-01-31 MED ORDER — FUROSEMIDE 40 MG PO TABS: ORAL_TABLET | ORAL | Status: DC

## 2013-01-31 MED ORDER — PRAVASTATIN SODIUM 40 MG PO TABS: 40.0000 mg | ORAL_TABLET | Freq: Every evening | ORAL | Status: DC

## 2013-01-31 NOTE — Assessment & Plan Note (Signed)
Improvement in LVEF on medical therapy to the range of 55-60%.

## 2013-01-31 NOTE — Assessment & Plan Note (Signed)
Status post bioprosthetic AVR and mitral valve repair with prior history of endocarditis as outlined. Recent echocardiogram reviewed.

## 2013-01-31 NOTE — Assessment & Plan Note (Signed)
No active angina symptoms status post CABG in 2008. Now off aspirin.

## 2013-01-31 NOTE — Assessment & Plan Note (Signed)
Continues on amiodarone and Coumadin status post Cox-Maze procedure.

## 2013-01-31 NOTE — Assessment & Plan Note (Signed)
Blood pressure well-controlled today. 

## 2013-01-31 NOTE — Patient Instructions (Signed)
   Refill on cardiac meds sent to pharm Continue all current medications. Continue with compression stockings - new rx provided today Follow up in  4 months

## 2013-01-31 NOTE — Assessment & Plan Note (Signed)
Likely multifactorial, on Norvasc, also dilated RV with decreased function and RVSP 58 mm mercury. Plan is to continue Lasix, we discussed uptitrating based on increase in weight or change in edema. Also given prescription for new set of compression stockings. Limit sodium.

## 2013-01-31 NOTE — Progress Notes (Signed)
Clinical Summary [name] is a medically complex 79 y.o.male presenting for followup. This is our first meeting in the office. He is a former patient of Dr. Andee Lineman, most recently seen by Dr. Antoine Poche. He reports doing reasonably well. Leg edema is better with use of compression nose. Lasix was intensified temporarily. Weight is down 5 pounds from last visit.  Recent echocardiogram from June demonstrated normal LV chamber size with mild LVH, LVEF 55-60% with basal inferior hypokinesis, bioprosthetic aortic valve with increased gradient but no regurgitation - mean gradient 28 mm mercury, stable mitral valve repair, gradient consistent with mild stenosis and trivial regurgitation - mean gradient 6 mm mercury, severely dilated left atrium, severely dilated right ventricle with pacer wire noted and mild to moderate reduction in function, severely dilated right atrium, moderate tricuspid regurgitation with RVSP 58 mmHg, increased CVP.  He is functionally limited, decreased range of motion in his left leg chronically, also right knee pain related to arthritis.   Recent lab work from May showed potassium 4.6, BUN 24, creatinine 1.1, hemoglobin 8.9. Patient has been taken off aspirin, did have heme positive stools per work up with primary care provider. Plan is iron supplementation. He continues on Coumadin, followed by Dr. Criss Alvine.  He reports no fevers or chills, stable appetite, no palpitations or syncope.   No Known Allergies  Current Outpatient Prescriptions  Medication Sig Dispense Refill  . amLODipine (NORVASC) 10 MG tablet TAKE 1 TABLET BY MOUTH ONCE A DAY  30 tablet  0  . benazepril (LOTENSIN) 40 MG tablet TAKE 1 TABLET BY MOUTH EVERY DAY  30 tablet  3  . carvedilol (COREG) 3.125 MG tablet Take 3.125 mg by mouth 2 (two) times daily with a meal.        . Elastic Bandages & Supports (MEDICAL COMPRESSION STOCKINGS) MISC 1 each by Does not apply route as directed.  1 each  0  . Ferrous  Fumarate-Vitamin C (VITRON-C PO) Take 1.5 tablets by mouth 2 (two) times daily.       . furosemide (LASIX) 40 MG tablet Take 40 mg by mouth. Take 2 tablets on Monday, Wednesday, and Friday and 1 tablet all other days.      . Multiple Vitamins-Minerals (THEREMS M PO) Take 1 tablet by mouth daily.        . nitroGLYCERIN (NITROSTAT) 0.4 MG SL tablet Place 0.4 mg under the tongue every 5 (five) minutes as needed.        Marland Kitchen PACERONE 200 MG tablet TAKE 1 TABLET BY MOUTH EVERY DAY NEED TO MAKE APPOINTMENT  30 tablet  0  . potassium chloride SA (KLOR-CON M20) 20 MEQ tablet Take 20 mEq by mouth daily.       . pravastatin (PRAVACHOL) 40 MG tablet TAKE 1 TABLET BY MOUTH AT BEDTIME (NEEDS CHOLESTEROL LABS SENT TO DOCTORS OFFICE)  30 tablet  1  . psyllium (METAMUCIL) 58.6 % packet Take 1 packet by mouth daily.        Marland Kitchen warfarin (COUMADIN) 3 MG tablet Take 3 mg by mouth as directed. Managed by Dr. Criss Alvine       No current facility-administered medications for this visit.    Past Medical History  Diagnosis Date  . Symptomatic bradycardia   . Atrial flutter     Recurrent, left side. Amiodarone therapy, coumadin therapy, prior history of cardioversion and overdrive pacing; maintenance on normal sinus rhythm  . Cardiomyopathy     Mixed ischemic and nonischemic with previous EF of  35-40%, most recently in June 2009 normalized to 55-60%. Echo 2/11 EF 55-60%  . Coronary atherosclerosis of native coronary artery     Status post coronary bypass grafting  . Valvular heart disease     Status post by prosthetic aortic valve and mitral valve repair  . 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 Paroxysmal atrial fibrillation     Amiodarone therapy, status post Cox-Maze procedure but with recurrent atrial fibrillation while on amiodarone August 2012  . Carotid artery disease     Less than 50% bilateral internal carotid artery stenosis by Doppler 2009  . Essential  hypertension, benign   . Benign prostatic hyperplasia   . Carpal tunnel syndrome   . Warfarin anticoagulation     Past Surgical History  Procedure Laterality Date  . Coronary artery bypass graft  2008    x2  . Total hip arthroplasty      Began at age 48, replacement age 65, age 3  . Knee arthroscopy    . Carpal tunnel release  2009  . Pacemaker insertion  2009    Initially implanted at [facility], extracted by Dr Ladona Ridgel for endocarditis and reimplanted 8/12  . Aortic valve replacement    . Tonsillectomy  1939  . Hernia repair  age 49  . Patella fracture surgery  age 57  . Cataract extraction, bilateral  07/2011    Social History [name] reports that he quit smoking about 58 years ago. His smoking use included Cigarettes. He has a 2.4 pack-year smoking history. He has never used smokeless tobacco. [name] reports that he does not drink alcohol.  Review of Systems Hard of hearing, negative except as outlined.  Physical Examination Filed Vitals:   01/31/13 1337  BP: 123/64  Pulse: 60   Filed Weights   01/31/13 1337  Weight: 188 lb (85.276 kg)   Comfortable at rest. HEENT: Conjunctiva and lids normal, oropharynx clear. Neck: Supple, no elevated JVP, no thyromegaly. Lungs: Clear to auscultation, nonlabored breathing at rest. Cardiac: Regular rate and rhythm, no S3, 2-3/6 systolic murmur at base and apex, no pericardial rub. Abdomen: Soft, nontender, protuberent, bowel sounds present, no guarding or rebound. Extremities: Chronic appearing 2+ edema, distal pulses 1-2+. Skin: Warm and dry. Musculoskeletal: No kyphosis. Neuropsychiatric: Alert and oriented x3, affect grossly appropriate.   Problem List and Plan   Edema Likely multifactorial, on Norvasc, also dilated RV with decreased function and RVSP 58 mm mercury. Plan is to continue Lasix, we discussed uptitrating based on increase in weight or change in edema. Also given prescription for new set of compression stockings.  Limit sodium.  Coronary atherosclerosis of native coronary artery No active angina symptoms status post CABG in 2008. Now off aspirin.  Paroxysmal atrial fibrillation Continues on amiodarone and Coumadin status post Cox-Maze procedure.  Valvular heart disease Status post bioprosthetic AVR and mitral valve repair with prior history of endocarditis as outlined. Recent echocardiogram reviewed.  Essential hypertension, benign Blood pressure well controlled today.  Secondary cardiomyopathy, unspecified Improvement in LVEF on medical therapy to the range of 55-60%.    Jonelle Sidle, M.D., F.A.C.C.

## 2013-02-06 ENCOUNTER — Other Ambulatory Visit: Payer: Self-pay | Admitting: Cardiology

## 2013-02-06 MED ORDER — BENAZEPRIL HCL 40 MG PO TABS: 40.0000 mg | ORAL_TABLET | Freq: Every day | ORAL | Status: DC

## 2013-02-10 ENCOUNTER — Encounter: Payer: Self-pay | Admitting: Internal Medicine

## 2013-02-12 ENCOUNTER — Encounter: Payer: Self-pay | Admitting: Physician Assistant

## 2013-02-13 ENCOUNTER — Encounter: Payer: Self-pay | Admitting: Physician Assistant

## 2013-02-13 DIAGNOSIS — I428 Other cardiomyopathies: Secondary | ICD-10-CM

## 2013-02-13 DIAGNOSIS — G459 Transient cerebral ischemic attack, unspecified: Secondary | ICD-10-CM

## 2013-02-13 DIAGNOSIS — I4891 Unspecified atrial fibrillation: Secondary | ICD-10-CM

## 2013-02-13 DIAGNOSIS — R079 Chest pain, unspecified: Secondary | ICD-10-CM

## 2013-03-13 ENCOUNTER — Encounter: Payer: Medicare Other | Admitting: Physician Assistant

## 2013-03-15 ENCOUNTER — Ambulatory Visit (INDEPENDENT_AMBULATORY_CARE_PROVIDER_SITE_OTHER): Payer: Medicare Other | Admitting: Physician Assistant

## 2013-03-15 VITALS — BP 130/69 | HR 67 | Ht 67.0 in | Wt 188.0 lb

## 2013-03-15 DIAGNOSIS — I429 Cardiomyopathy, unspecified: Secondary | ICD-10-CM

## 2013-03-15 DIAGNOSIS — I480 Paroxysmal atrial fibrillation: Secondary | ICD-10-CM

## 2013-03-15 DIAGNOSIS — D649 Anemia, unspecified: Secondary | ICD-10-CM

## 2013-03-15 DIAGNOSIS — Z950 Presence of cardiac pacemaker: Secondary | ICD-10-CM

## 2013-03-15 DIAGNOSIS — I38 Endocarditis, valve unspecified: Secondary | ICD-10-CM

## 2013-03-15 DIAGNOSIS — I4891 Unspecified atrial fibrillation: Secondary | ICD-10-CM

## 2013-03-15 DIAGNOSIS — I2510 Atherosclerotic heart disease of native coronary artery without angina pectoris: Secondary | ICD-10-CM

## 2013-03-15 NOTE — Assessment & Plan Note (Signed)
Patient is due to follow up with Dr. Johney Frame of our device clinic, next week.

## 2013-03-15 NOTE — Patient Instructions (Signed)
Continue all current medications. Follow up in  3 months 

## 2013-03-15 NOTE — Progress Notes (Signed)
Primary Cardiologist: Simona Huh, MD   HPI: Post hospital followup from [facility], following presentation with TIA.  Patient was referred to in consultation, and deemed stable from a cardiac standpoint. Cardiac markers negative. No evidence of CHF on CXR. EKG indicated V pacing at 60, with underlying AF. An echocardiogram was ordered, and deemed unchanged from prior study of one month ago, with no evidence of thrombus.  We recommended that patient resume prior home medication regimen with the exception of ASA, given that he is on chronic Coumadin with history of chronic thrombocytopenia and anemia.  He presents today reporting no CP, significant dyspnea, or tachycardia palpitations. He has since been seen in followup with Dr. Criss Alvine, who follows his Coumadin dosing. He is scheduled to return to Dr. Johney Frame next week, for routine device followup.  He denies any residual TIA sequelae.  No Known Allergies  Current Outpatient Prescriptions  Medication Sig Dispense Refill  . amiodarone (PACERONE) 200 MG tablet Take 1 tablet (200 mg total) by mouth daily.  30 tablet  6  . amLODipine (NORVASC) 10 MG tablet Take 1 tablet (10 mg total) by mouth daily.  30 tablet  6  . benazepril (LOTENSIN) 40 MG tablet Take 1 tablet (40 mg total) by mouth daily.  30 tablet  6  . carvedilol (COREG) 3.125 MG tablet Take 1 tablet (3.125 mg total) by mouth 2 (two) times daily with a meal.  60 tablet  6  . Elastic Bandages & Supports (MEDICAL COMPRESSION STOCKINGS) MISC 1 each by Does not apply route as directed.  1 each  0  . Ferrous Fumarate-Vitamin C (VITRON-C PO) Take 1.5 tablets by mouth 2 (two) times daily.       . furosemide (LASIX) 40 MG tablet Take 2 tablets on Monday, Wednesday, and Friday and 1 tablet all other days.  60 tablet  6  . Multiple Vitamins-Minerals (THEREMS M PO) Take 1 tablet by mouth daily.        . nitroGLYCERIN (NITROSTAT) 0.4 MG SL tablet Place 1 tablet (0.4 mg total) under the tongue every 5 (five)  minutes as needed.  25 tablet  3  . potassium chloride SA (KLOR-CON M20) 20 MEQ tablet Take 1 tablet (20 mEq total) by mouth daily.  30 tablet  6  . pravastatin (PRAVACHOL) 40 MG tablet Take 1 tablet (40 mg total) by mouth every evening.  30 tablet  6  . psyllium (METAMUCIL) 58.6 % packet Take 1 packet by mouth daily.        Marland Kitchen warfarin (COUMADIN) 3 MG tablet Take 3 mg by mouth as directed. Managed by Dr. Criss Alvine       No current facility-administered medications for this visit.    Past Medical History  Diagnosis Date  . Symptomatic bradycardia   . Atrial flutter     Recurrent, left side. Amiodarone therapy, coumadin therapy, prior history of cardioversion and overdrive pacing; maintenance on normal sinus rhythm  . Cardiomyopathy     Mixed ischemic and nonischemic with previous EF of 35-40%, most recently in June 2009 normalized to 55-60%. Echo 2/11 EF 55-60%  . Coronary atherosclerosis of native coronary artery     Status post coronary bypass grafting  . Valvular heart disease     Status post by prosthetic aortic valve and mitral valve repair  . 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 Paroxysmal atrial fibrillation     Amiodarone therapy, status post Cox-Maze  procedure but with recurrent atrial fibrillation while on amiodarone August 2012  . Carotid artery disease     Less than 50% bilateral internal carotid artery stenosis by Doppler 2009  . Essential hypertension, benign   . Benign prostatic hyperplasia   . Carpal tunnel syndrome   . Warfarin anticoagulation     Past Surgical History  Procedure Laterality Date  . Coronary artery bypass graft  2008    x2  . Total hip arthroplasty      Began at age 8, replacement age 66, age 36  . Knee arthroscopy    . Carpal tunnel release  2009  . Pacemaker insertion  2009    Initially implanted at [facility] extracted by Dr Ladona Ridgel for endocarditis and reimplanted 8/12  . Aortic valve  replacement    . Tonsillectomy  1939  . Hernia repair  age 56  . Patella fracture surgery  age 55  . Cataract extraction, bilateral  07/2011    History   Social History  . Marital Status: Married    Spouse Name: N/A    Number of Children: N/A  . Years of Education: N/A   Occupational History  . Not on file.   Social History Main Topics  . Smoking status: Former Smoker -- 0.30 packs/day for 8 years    Types: Cigarettes    Quit date: 07/27/1954  . Smokeless tobacco: Never Used  . Alcohol Use: No  . Drug Use: No  . Sexual Activity: Not on file   Other Topics Concern  . Not on file   Social History Narrative  . No narrative on file    Family History  Problem Relation Age of Onset  . Diabetes Neg Hx   . Hypertension Neg Hx   . Coronary artery disease Neg Hx   . Cancer Mother   . Other Father     Heart attack or stroke    ROS: no nausea, vomiting; no fever, chills; no melena, hematochezia; no claudication  PHYSICAL EXAM: BP 130/69  Pulse 67  Ht 5\' 7"  (1.702 m)  Wt 188 lb (85.276 kg)  BMI 29.44 kg/m2 GENERAL: 79 year-old male; NAD HEENT: NCAT, PERRLA, EOMI; sclera clear; no xanthelasma NECK: palpable bilateral carotid pulses, no bruits; no JVD; no TM LUNGS: CTA bilaterally CARDIAC: RRR (S1, S2); 2-3/6 systolic ejection murmur at base; no rubs or gallops ABDOMEN: soft, non-tender; intact BS EXTREMETIES: Trace peripheral edema, with compression hose SKIN: warm/dry; no obvious rash/lesions MUSCULOSKELETAL: no joint deformity NEURO: no focal deficit; NL affect   EKG:    ASSESSMENT & PLAN:  Coronary atherosclerosis of native coronary artery Remains quiescent on current medication regimen, with recent negative cardiac markers.  Paroxysmal atrial fibrillation Adequately rate controlled on current medication regimen, which includes amiodarone. On chronic Coumadin anticoagulation, followed by primary M.D.  Pacemaker Patient is due to follow up with Dr.  Johney Frame of our device clinic, next week.  Secondary cardiomyopathy, unspecified Normalized LVF by recent echocardiograms. Patient recently presented with no definite evidence of CHF by CXR, or by history. Continue current diuretic regimen.  Valvular heart disease Stable bioprosthetic AVR and MV repair, by recent echocardiogram.  Anemia Patient remains off ASA, as recently recommended, given history of anemia/chronic thrombocytopenia.    Gene Rolena Knutson, PAC

## 2013-03-15 NOTE — Assessment & Plan Note (Addendum)
Adequately rate controlled on current medication regimen, which includes amiodarone. On chronic Coumadin anticoagulation, followed by primary M.D.

## 2013-03-15 NOTE — Assessment & Plan Note (Signed)
Remains quiescent on current medication regimen, with recent negative cardiac markers.

## 2013-03-15 NOTE — Assessment & Plan Note (Signed)
Normalized LVF by recent echocardiograms. Patient recently presented with no definite evidence of CHF by CXR, or by history. Continue current diuretic regimen.

## 2013-03-15 NOTE — Assessment & Plan Note (Signed)
Patient remains off ASA, as recently recommended, given history of anemia/chronic thrombocytopenia.

## 2013-03-15 NOTE — Assessment & Plan Note (Signed)
Stable bioprosthetic AVR and MV repair, by recent echocardiogram.

## 2013-03-20 ENCOUNTER — Ambulatory Visit (INDEPENDENT_AMBULATORY_CARE_PROVIDER_SITE_OTHER): Payer: Medicare Other | Admitting: Internal Medicine

## 2013-03-20 ENCOUNTER — Encounter: Payer: Self-pay | Admitting: Internal Medicine

## 2013-03-20 VITALS — BP 123/65 | HR 60 | Ht 67.0 in | Wt 188.4 lb

## 2013-03-20 DIAGNOSIS — Z950 Presence of cardiac pacemaker: Secondary | ICD-10-CM

## 2013-03-20 DIAGNOSIS — I4891 Unspecified atrial fibrillation: Secondary | ICD-10-CM

## 2013-03-20 DIAGNOSIS — I498 Other specified cardiac arrhythmias: Secondary | ICD-10-CM

## 2013-03-20 DIAGNOSIS — I480 Paroxysmal atrial fibrillation: Secondary | ICD-10-CM

## 2013-03-20 DIAGNOSIS — I495 Sick sinus syndrome: Secondary | ICD-10-CM

## 2013-03-20 LAB — PACEMAKER DEVICE OBSERVATION
AL AMPLITUDE: 0.25 mv
AL IMPEDENCE PM: 460 Ohm
ATRIAL PACING PM: 15
BATTERY VOLTAGE: 2.78 V
BRDY-0002RV: 60 {beats}/min
RV LEAD AMPLITUDE: 8 mv
RV LEAD IMPEDENCE PM: 467 Ohm
RV LEAD THRESHOLD: 1.25 V
VENTRICULAR PACING PM: 45

## 2013-03-20 NOTE — Progress Notes (Signed)
PCP:  Thalia Bloodgood, MD Primary Cardiologist:  Dr Diona Browner  The patient presents today for routine electrophysiology followup.  Since last being seen in our clinic, the patient reports doing reasonably well.  He was recently admitted with TIA.  His symptoms have resolved.  He has rare short dizziness.   Today, he denies symptoms of palpitations, chest pain, shortness of breath, orthopnea, PND, presyncope, syncope, or further neurologic sequela.  The patient feels that he is tolerating medications without difficulties and is otherwise without complaint today.   Past Medical History  Diagnosis Date  . Symptomatic bradycardia   . Atrial flutter     Recurrent, left side. Amiodarone therapy, coumadin therapy, prior history of cardioversion and overdrive pacing; maintenance on normal sinus rhythm  . Cardiomyopathy     Mixed ischemic and nonischemic with previous EF of 35-40%, most recently in June 2009 normalized to 55-60%. Echo 2/11 EF 55-60%  . Coronary atherosclerosis of native coronary artery     Status post coronary bypass grafting  . Valvular heart disease     Status post by prosthetic aortic valve and mitral valve repair  . 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 Paroxysmal atrial fibrillation     Amiodarone therapy, status post Cox-Maze procedure but with recurrent atrial fibrillation while on amiodarone August 2012  . Carotid artery disease     Less than 50% bilateral internal carotid artery stenosis by Doppler 2009  . Essential hypertension, benign   . Benign prostatic hyperplasia   . Carpal tunnel syndrome   . Warfarin anticoagulation    Past Surgical History  Procedure Laterality Date  . Coronary artery bypass graft  2008    x2  . Total hip arthroplasty      Began at age 29, replacement age 58, age 88  . Knee arthroscopy    . Carpal tunnel release  2009  . Pacemaker insertion  2009    Initially implanted at  [facility], extracted by Dr Ladona Ridgel for endocarditis and reimplanted 8/12  . Aortic valve replacement    . Tonsillectomy  1939  . Hernia repair  age 47  . Patella fracture surgery  age 78  . Cataract extraction, bilateral  07/2011    Current Outpatient Prescriptions  Medication Sig Dispense Refill  . amiodarone (PACERONE) 200 MG tablet Take 1 tablet (200 mg total) by mouth daily.  30 tablet  6  . amLODipine (NORVASC) 10 MG tablet Take 1 tablet (10 mg total) by mouth daily.  30 tablet  6  . benazepril (LOTENSIN) 40 MG tablet Take 1 tablet (40 mg total) by mouth daily.  30 tablet  6  . carvedilol (COREG) 3.125 MG tablet Take 1 tablet (3.125 mg total) by mouth 2 (two) times daily with a meal.  60 tablet  6  . Elastic Bandages & Supports (MEDICAL COMPRESSION STOCKINGS) MISC 1 each by Does not apply route as directed.  1 each  0  . Ferrous Fumarate-Vitamin C (VITRON-C PO) Take 1.5 tablets by mouth 2 (two) times daily.       . furosemide (LASIX) 40 MG tablet Take 2 tablets on Monday, Wednesday, and Friday and 1 tablet all other days.  60 tablet  6  . Multiple Vitamins-Minerals (THEREMS M PO) Take 1 tablet by mouth daily.        . nitroGLYCERIN (NITROSTAT) 0.4 MG SL tablet Place 1 tablet (0.4 mg total) under the tongue every  5 (five) minutes as needed.  25 tablet  3  . potassium chloride SA (KLOR-CON M20) 20 MEQ tablet Take 1 tablet (20 mEq total) by mouth daily.  30 tablet  6  . pravastatin (PRAVACHOL) 40 MG tablet Take 1 tablet (40 mg total) by mouth every evening.  30 tablet  6  . psyllium (METAMUCIL) 58.6 % packet Take 1 packet by mouth daily.        Marland Kitchen warfarin (COUMADIN) 3 MG tablet Take 3 mg by mouth as directed. Managed by Dr. Criss Alvine       No current facility-administered medications for this visit.    No Known Allergies  History   Social History  . Marital Status: Married    Spouse Name: N/A    Number of Children: N/A  . Years of Education: N/A   Occupational History  . Not on file.    Social History Main Topics  . Smoking status: Former Smoker -- 0.30 packs/day for 8 years    Types: Cigarettes    Quit date: 07/27/1954  . Smokeless tobacco: Never Used  . Alcohol Use: No  . Drug Use: No  . Sexual Activity: Not on file   Other Topics Concern  . Not on file   Social History Narrative  . No narrative on file    Family History  Problem Relation Age of Onset  . Diabetes Neg Hx   . Hypertension Neg Hx   . Coronary artery disease Neg Hx   . Cancer Mother   . Other Father     Heart attack or stroke   Physical Exam: Filed Vitals:   03/20/13 1322  BP: 123/65  Pulse: 60  Height: 5\' 7"  (1.702 m)  Weight: 188 lb 6.4 oz (85.458 kg)  SpO2: 97%    GEN- The patient is well appearing, alert and oriented x 3 today.   Head- normocephalic, atraumatic Eyes-  Sclera clear, conjunctiva pink Ears- hearing intact Oropharynx- clear Neck- supple, no JVP Lymph- no cervical lymphadenopathy Lungs- Clear to ausculation bilaterally, normal work of breathing Chest- pacemaker pocket is well healed, old L sided pocket also well healed Heart- Regular rate and rhythm, 2/6 SEM LUSB (early peaking), 2/6 SEM at the apex GI- soft, NT, ND, + BS Extremities- no clubbing, cyanosis, trace edema  Pacemaker interrogation- reviewed in detail today,  See PACEART report  Assessment and Plan:  1. Sick sinus syndrome/ symptomatic bradycadria Normal pacemaker function See Pace Art report No changes today  2. afib 1.3% by pacemaker interrogation.  I think he has very frequent but short episodes of afib.  Possibly undersensing afib at times. Continue current regimen LFTs/TFTs followed by Dr Criss Alvine  Return to the device clinic in 1 year [facility]

## 2013-03-20 NOTE — Patient Instructions (Signed)
   3 months - [facility] Continue all current medications. Allred - 1 year

## 2013-03-23 ENCOUNTER — Encounter: Payer: Medicare Other | Admitting: Physician Assistant

## 2013-06-01 ENCOUNTER — Encounter: Payer: Self-pay | Admitting: Cardiology

## 2013-06-01 ENCOUNTER — Ambulatory Visit (INDEPENDENT_AMBULATORY_CARE_PROVIDER_SITE_OTHER): Payer: Medicare Other | Admitting: Cardiology

## 2013-06-01 VITALS — BP 141/69 | HR 60 | Ht 66.0 in | Wt 184.0 lb

## 2013-06-01 DIAGNOSIS — I495 Sick sinus syndrome: Secondary | ICD-10-CM

## 2013-06-01 DIAGNOSIS — I4892 Unspecified atrial flutter: Secondary | ICD-10-CM

## 2013-06-01 DIAGNOSIS — I2510 Atherosclerotic heart disease of native coronary artery without angina pectoris: Secondary | ICD-10-CM

## 2013-06-01 DIAGNOSIS — I429 Cardiomyopathy, unspecified: Secondary | ICD-10-CM

## 2013-06-01 DIAGNOSIS — I38 Endocarditis, valve unspecified: Secondary | ICD-10-CM

## 2013-06-01 NOTE — Assessment & Plan Note (Signed)
History of atrial flutter and fibrillation, continues on amiodarone. Followed by Dr. Allred. 

## 2013-06-01 NOTE — Progress Notes (Signed)
Clinical Summary Mr. Tesler is a medically complex 79 y.o.male last seen by me in July. He has had subsequent followup with Dr. Johney Frame with normal pacer function, frequent but brief episodes of atrial fibrillation noted. He is here with his wife today, has been doing fairly well from a cardiac perspective. Continues to follow Coumadin with Dr. Criss Alvine.  Lab work from July showed BUN 18, creatinine 0.9, potassium 4.1.  Recent echocardiogram from June demonstrated normal LV chamber size with mild LVH, LVEF 55-60% with basal inferior hypokinesis, bioprosthetic aortic valve with increased gradient but no regurgitation - mean gradient 28 mm mercury, stable mitral valve repair, gradient consistent with mild stenosis and trivial regurgitation - mean gradient 6 mm mercury, severely dilated left atrium, severely dilated right ventricle with pacer wire noted and mild to moderate reduction in function, severely dilated right atrium, moderate tricuspid regurgitation with RVSP 58 mmHg, increased CVP.   We reviewed his medications, cardiac regimen is stable.  He tells me that he might be considering elective herniorrhaphy, has not had a surgical consultation as yet. Also struggles with chronic right knee pain, has had some injections with Dr. Lequita Halt.   No Known Allergies  Current Outpatient Prescriptions  Medication Sig Dispense Refill  . amiodarone (PACERONE) 200 MG tablet Take 1 tablet (200 mg total) by mouth daily.  30 tablet  6  . amLODipine (NORVASC) 10 MG tablet Take 1 tablet (10 mg total) by mouth daily.  30 tablet  6  . benazepril (LOTENSIN) 40 MG tablet Take 1 tablet (40 mg total) by mouth daily.  30 tablet  6  . carvedilol (COREG) 3.125 MG tablet Take 1 tablet (3.125 mg total) by mouth 2 (two) times daily with a meal.  60 tablet  6  . Elastic Bandages & Supports (MEDICAL COMPRESSION STOCKINGS) MISC 1 each by Does not apply route as directed.  1 each  0  . Ferrous Fumarate-Vitamin C (VITRON-C PO)  Take 1.5 tablets by mouth 2 (two) times daily.       . furosemide (LASIX) 40 MG tablet Take 2 tablets on Monday, Wednesday, and Friday and 1 tablet all other days.  60 tablet  6  . Multiple Vitamins-Minerals (THEREMS M PO) Take 1 tablet by mouth daily.        . nitroGLYCERIN (NITROSTAT) 0.4 MG SL tablet Place 1 tablet (0.4 mg total) under the tongue every 5 (five) minutes as needed.  25 tablet  3  . potassium chloride SA (KLOR-CON M20) 20 MEQ tablet Take 1 tablet (20 mEq total) by mouth daily.  30 tablet  6  . pravastatin (PRAVACHOL) 40 MG tablet Take 1 tablet (40 mg total) by mouth every evening.  30 tablet  6  . psyllium (METAMUCIL) 58.6 % packet Take 1 packet by mouth daily.        Marland Kitchen warfarin (COUMADIN) 3 MG tablet Take 3 mg by mouth as directed. Managed by Dr. Criss Alvine       No current facility-administered medications for this visit.    Past Medical History  Diagnosis Date  . Symptomatic bradycardia   . Atrial flutter     Recurrent, left side. Amiodarone therapy, coumadin therapy, prior history of cardioversion and overdrive pacing; maintenance on normal sinus rhythm  . Cardiomyopathy     Mixed ischemic and nonischemic with previous EF of 35-40%, most recently in June 2009 normalized to 55-60%. Echo 2/11 EF 55-60%  . Coronary atherosclerosis of native coronary artery  Status post coronary bypass grafting  . Valvular heart disease     Status post by prosthetic aortic valve and mitral valve repair  . 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 Paroxysmal atrial fibrillation     Amiodarone therapy, status post Cox-Maze procedure but with recurrent atrial fibrillation while on amiodarone August 2012  . Carotid artery disease     Less than 50% bilateral internal carotid artery stenosis by Doppler 2009  . Essential hypertension, benign   . Benign prostatic hyperplasia   . Carpal tunnel syndrome   . Warfarin anticoagulation      Past Surgical History  Procedure Laterality Date  . Coronary artery bypass graft  2008    x2  . Total hip arthroplasty      Began at age 64, replacement age 89, age 57  . Knee arthroscopy    . Carpal tunnel release  2009  . Pacemaker insertion  2009    Initially implanted at [facility], extracted by Dr Ladona Ridgel for endocarditis and reimplanted 8/12  . Aortic valve replacement    . Tonsillectomy  1939  . Hernia repair  age 88  . Patella fracture surgery  age 37  . Cataract extraction, bilateral  07/2011    Social History Mr. Tompson reports that he quit smoking about 58 years ago. His smoking use included Cigarettes. He has a 2.4 pack-year smoking history. He has never used smokeless tobacco. Mr. Shell reports that he does not drink alcohol.  Review of Systems No major complaint of palpitations, no angina. Chronic stable dyspnea. Had TIA, evaluated at [facility] back in August. Otherwise negative.  Physical Examination Filed Vitals:   06/01/13 1443  BP: 141/69  Pulse: 60   Filed Weights   06/01/13 1443  Weight: 184 lb (83.462 kg)    Comfortable at rest.  HEENT: Conjunctiva and lids normal, oropharynx clear.  Neck: Supple, no elevated JVP, no thyromegaly.  Lungs: Clear to auscultation, nonlabored breathing at rest.  Cardiac: Regular rate and rhythm, no S3, 3/6 systolic murmur at base and apex, no pericardial rub.  Abdomen: Soft, nontender, protuberent, bowel sounds present, no guarding or rebound.  Extremities: Chronic appearing 2+ edema, distal pulses 1-2+.  Skin: Warm and dry.  Musculoskeletal: No kyphosis.  Neuropsychiatric: Alert and oriented x3, affect grossly appropriate.   Problem List and Plan   Coronary atherosclerosis of native coronary artery Multivessel disease status post CABG. No active angina. Continue medical therapy.  Secondary cardiomyopathy, unspecified Normalization of LVEF on medical therapy, range of 55-60% by echocardiogram in June of this  year.  Valvular heart disease Status post prosthetic aortic and mitral valve surgery, on chronic Coumadin which is followed by Dr. Criss Alvine. Valve gradients outlined above by most recent echocardiogram, continue medical therapy and observation for now.  Atrial flutter History of atrial flutter and fibrillation, continues on amiodarone. Followed by Dr. Johney Frame.  Sick sinus syndrome Status post pacemaker placement with extraction secondary to endocarditis and reimplantation 2012.    Jonelle Sidle, M.D., F.A.C.C.

## 2013-06-01 NOTE — Patient Instructions (Signed)
Continue all current medications. Follow up in  3 months 

## 2013-06-01 NOTE — Assessment & Plan Note (Signed)
Status post prosthetic aortic and mitral valve surgery, on chronic Coumadin which is followed by Dr. Prince. Valve gradients outlined above by most recent echocardiogram, continue medical therapy and observation for now. 

## 2013-06-01 NOTE — Assessment & Plan Note (Signed)
Status post pacemaker placement with extraction secondary to endocarditis and reimplantation 2012.

## 2013-06-01 NOTE — Assessment & Plan Note (Signed)
Normalization of LVEF on medical therapy, range of 55-60% by echocardiogram in June of this year.

## 2013-06-01 NOTE — Assessment & Plan Note (Signed)
Multivessel disease status post CABG. No active angina. Continue medical therapy.

## 2013-06-26 ENCOUNTER — Ambulatory Visit (INDEPENDENT_AMBULATORY_CARE_PROVIDER_SITE_OTHER): Payer: Medicare Other | Admitting: *Deleted

## 2013-06-26 ENCOUNTER — Encounter: Payer: Self-pay | Admitting: Internal Medicine

## 2013-06-26 DIAGNOSIS — I495 Sick sinus syndrome: Secondary | ICD-10-CM

## 2013-07-02 LAB — MDC_IDC_ENUM_SESS_TYPE_REMOTE
Battery Impedance: 225 Ohm
Battery Remaining Longevity: 108 mo
Battery Voltage: 2.78 V
Brady Statistic AP VP Percent: 19 %
Brady Statistic AP VS Percent: 3 %
Brady Statistic AS VP Percent: 35 %
Brady Statistic AS VS Percent: 44 %
Date Time Interrogation Session: 20141201203840
Lead Channel Impedance Value: 442 Ohm
Lead Channel Impedance Value: 444 Ohm
Lead Channel Pacing Threshold Amplitude: 1.25 V
Lead Channel Pacing Threshold Pulse Width: 0.4 ms
Lead Channel Sensing Intrinsic Amplitude: 1 mV
Lead Channel Sensing Intrinsic Amplitude: 11.2 mV
Lead Channel Setting Pacing Amplitude: 2.5 V
Lead Channel Setting Pacing Amplitude: 2.5 V
Lead Channel Setting Pacing Pulse Width: 0.4 ms
Lead Channel Setting Sensing Sensitivity: 2 mV

## 2013-07-11 ENCOUNTER — Encounter: Payer: Self-pay | Admitting: *Deleted

## 2013-08-29 ENCOUNTER — Other Ambulatory Visit: Payer: Self-pay | Admitting: Cardiology

## 2013-09-14 ENCOUNTER — Encounter: Payer: Self-pay | Admitting: Cardiology

## 2013-09-14 ENCOUNTER — Ambulatory Visit (INDEPENDENT_AMBULATORY_CARE_PROVIDER_SITE_OTHER): Payer: Medicare Other | Admitting: Cardiology

## 2013-09-14 VITALS — BP 146/71 | HR 59 | Ht 67.0 in | Wt 186.8 lb

## 2013-09-14 DIAGNOSIS — I480 Paroxysmal atrial fibrillation: Secondary | ICD-10-CM

## 2013-09-14 DIAGNOSIS — I495 Sick sinus syndrome: Secondary | ICD-10-CM

## 2013-09-14 DIAGNOSIS — I4891 Unspecified atrial fibrillation: Secondary | ICD-10-CM

## 2013-09-14 DIAGNOSIS — I2510 Atherosclerotic heart disease of native coronary artery without angina pectoris: Secondary | ICD-10-CM

## 2013-09-14 DIAGNOSIS — I38 Endocarditis, valve unspecified: Secondary | ICD-10-CM

## 2013-09-14 DIAGNOSIS — I429 Cardiomyopathy, unspecified: Secondary | ICD-10-CM

## 2013-09-14 MED ORDER — CARVEDILOL 3.125 MG PO TABS: 3.1250 mg | ORAL_TABLET | Freq: Two times a day (BID) | ORAL | Status: DC

## 2013-09-14 MED ORDER — MEDICAL COMPRESSION STOCKINGS MISC: 1.0000 | Status: DC

## 2013-09-14 NOTE — Assessment & Plan Note (Signed)
Status post pacemaker placement with extraction secondary to endocarditis and reimplantation 2012. Keep follow up with Dr. Rayann Heman.

## 2013-09-14 NOTE — Progress Notes (Signed)
Clinical Summary Mr. Lesinski is a medically complex 80 y.o.male last seen in November 2014. Device interrogation in December 2014 by remote check demonstrated overall normal device function. AF noted 2.5% of the time with no high ventricular rates.  He is here with his wife today. No major change in cardiac status. He is also limited by orthopedic pain, uses a walker at home. No regular exercise regimen. Has had a few falls, none of them serious. Denies any bleeding problems on Coumadin.  Echocardiogram from June 2014 demonstrated normal LV chamber size with mild LVH, LVEF 55-60% with basal inferior hypokinesis, bioprosthetic aortic valve with increased gradient but no regurgitation - mean gradient 28 mm mercury, stable mitral valve repair, gradient consistent with mild stenosis and trivial regurgitation - mean gradient 6 mm mercury, severely dilated left atrium, severely dilated right ventricle with pacer wire noted and mild to moderate reduction in function, severely dilated right atrium, moderate tricuspid regurgitation with RVSP 58 mmHg, increased CVP.   He continues on Coumadin which is followed by Dr. Alfonse Spruce.   No Known Allergies  Current Outpatient Prescriptions  Medication Sig Dispense Refill  . amLODipine (NORVASC) 10 MG tablet Take 1 tablet (10 mg total) by mouth daily.  30 tablet  6  . benazepril (LOTENSIN) 40 MG tablet TAKE 1 TABLET BY MOUTH DAILY  30 tablet  6  . carvedilol (COREG) 3.125 MG tablet Take 1 tablet (3.125 mg total) by mouth 2 (two) times daily.  60 tablet  6  . Elastic Bandages & Supports (MEDICAL COMPRESSION STOCKINGS) MISC 1 each by Does not apply route as directed.  1 each  0  . Ferrous Fumarate-Vitamin C (VITRON-C PO) Take 1.5 tablets by mouth daily.       . furosemide (LASIX) 40 MG tablet Take 2 tablets on Monday, Wednesday, and Friday and 1 tablet all other days.  60 tablet  6  . KLOR-CON M20 20 MEQ tablet TAKE 1 TABLET BY MOUTH DAILY  30 tablet  6  . Multiple  Vitamins-Minerals (THEREMS M PO) Take 1 tablet by mouth daily.        . nitroGLYCERIN (NITROSTAT) 0.4 MG SL tablet Place 1 tablet (0.4 mg total) under the tongue every 5 (five) minutes as needed.  25 tablet  3  . PACERONE 200 MG tablet TAKE 1 TABLET BY MOUTH DAILY  30 tablet  6  . pravastatin (PRAVACHOL) 40 MG tablet TAKE 1 TABLET BY MOUTH EVERY EVENING  30 tablet  6  . psyllium (METAMUCIL) 58.6 % packet Take 1 packet by mouth daily.        Marland Kitchen warfarin (COUMADIN) 3 MG tablet Take 3 mg by mouth as directed. Managed by Dr. Alfonse Spruce       No current facility-administered medications for this visit.    Past Medical History  Diagnosis Date  . Symptomatic bradycardia   . Atrial flutter     Recurrent, left side. Amiodarone therapy, coumadin therapy, prior history of cardioversion and overdrive pacing; maintenance on normal sinus rhythm  . Cardiomyopathy     Mixed ischemic and nonischemic with previous EF of 35-40%, most recently in June 2009 normalized to 55-60%. Echo 2/11 EF 55-60%  . Coronary atherosclerosis of native coronary artery     Status post coronary bypass grafting  . Valvular heart disease     Status post by prosthetic aortic valve and mitral valve repair  . Endocarditis     s/p PPM extraction by Dr Lovena Le 2012 endocarditis with  enterococcus faecalis  . Paroxysmal atrial fibrillation     Amiodarone therapy, status post Cox-Maze procedure but with recurrent atrial fibrillation while on amiodarone August 2012  . Carotid artery disease     Less than 50% bilateral internal carotid artery stenosis by Doppler 2009  . Essential hypertension, benign   . Benign prostatic hyperplasia   . Carpal tunnel syndrome   . Warfarin anticoagulation     Past Surgical History  Procedure Laterality Date  . Coronary artery bypass graft  2008    x2  . Total hip arthroplasty      Began at age 8, replacement age 64, age 43  . Knee arthroscopy    . Carpal tunnel release  2009  . Pacemaker insertion   2009    Initially implanted at [facility], extracted by Dr Lovena Le for endocarditis and reimplanted 8/12  . Aortic valve replacement    . Tonsillectomy  1939  . Hernia repair  age 86  . Patella fracture surgery  age 80  . Cataract extraction, bilateral  07/2011    Social History Mr. Blank reports that he quit smoking about 59 years ago. His smoking use included Cigarettes. He has a 2.4 pack-year smoking history. He has never used smokeless tobacco. Mr. Delcarlo reports that he does not drink alcohol.  Review of Systems Chronic leg edema, has been using over-the-counter compression socks. No palpitations or syncope.  Physical Examination Filed Vitals:   09/14/13 1102  BP: 146/71  Pulse: 59   Filed Weights   09/14/13 1102  Weight: 186 lb 12.8 oz (84.732 kg)    Comfortable at rest.  HEENT: Conjunctiva and lids normal, oropharynx clear.  Neck: Supple, no elevated JVP, no thyromegaly.  Lungs: Clear to auscultation, nonlabored breathing at rest.  Cardiac: Regular rate and rhythm, no S3, 3/6 systolic murmur at base and apex, paradoxically split S2, no pericardial rub.  Abdomen: Soft, nontender, protuberent, bowel sounds present, no guarding or rebound.  Extremities: Chronic appearing 2+ edema, distal pulses 1-2+.  Skin: Warm and dry.  Musculoskeletal: No kyphosis.  Neuropsychiatric: Alert and oriented x3, affect grossly appropriate.   Problem List and Plan   Coronary atherosclerosis of native coronary artery Multivessel disease status post CABG, symptomatically stable on medical therapy.  Valvular heart disease Status post prosthetic aortic and mitral valve surgery, on chronic Coumadin which is followed by Dr. Alfonse Spruce. Valve gradients outlined above by most recent echocardiogram, continue medical therapy and observation for now.  Paroxysmal atrial fibrillation History of atrial flutter and fibrillation, continues on amiodarone. Followed by Dr. Rayann Heman.  Secondary cardiomyopathy,  unspecified LVEF 55-60% on medical therapy by most recent assessment.  Sick sinus syndrome Status post pacemaker placement with extraction secondary to endocarditis and reimplantation 2012. Keep follow up with Dr. Rayann Heman.    Satira Sark, M.D., F.A.C.C.

## 2013-09-14 NOTE — Assessment & Plan Note (Signed)
Multivessel disease status post CABG, symptomatically stable on medical therapy.

## 2013-09-14 NOTE — Patient Instructions (Signed)
Your physician recommends that you schedule a follow-up appointment in: 4 months. Your physician recommends that you continue on your current medications as directed. Please refer to the Current Medication list given to you today.  You have been given a prescription for compression hose.

## 2013-09-14 NOTE — Assessment & Plan Note (Signed)
LVEF 55-60% on medical therapy by most recent assessment.

## 2013-09-14 NOTE — Assessment & Plan Note (Signed)
Status post prosthetic aortic and mitral valve surgery, on chronic Coumadin which is followed by Dr. Alfonse Spruce. Valve gradients outlined above by most recent echocardiogram, continue medical therapy and observation for now.

## 2013-09-14 NOTE — Assessment & Plan Note (Signed)
History of atrial flutter and fibrillation, continues on amiodarone. Followed by Dr. Rayann Heman.

## 2013-09-27 ENCOUNTER — Ambulatory Visit (INDEPENDENT_AMBULATORY_CARE_PROVIDER_SITE_OTHER): Payer: Medicare Other | Admitting: *Deleted

## 2013-09-27 ENCOUNTER — Encounter: Payer: Self-pay | Admitting: Internal Medicine

## 2013-09-27 DIAGNOSIS — I495 Sick sinus syndrome: Secondary | ICD-10-CM

## 2013-10-01 LAB — MDC_IDC_ENUM_SESS_TYPE_REMOTE
Battery Impedance: 250 Ohm
Battery Remaining Longevity: 98 mo
Battery Voltage: 2.78 V
Brady Statistic AP VP Percent: 20 %
Brady Statistic AP VS Percent: 5 %
Brady Statistic AS VP Percent: 37 %
Brady Statistic AS VS Percent: 39 %
Date Time Interrogation Session: 20150304202609
Lead Channel Impedance Value: 448 Ohm
Lead Channel Impedance Value: 465 Ohm
Lead Channel Pacing Threshold Amplitude: 1.375 V
Lead Channel Pacing Threshold Pulse Width: 0.4 ms
Lead Channel Sensing Intrinsic Amplitude: 1 mV
Lead Channel Sensing Intrinsic Amplitude: 11.2 mV
Lead Channel Setting Pacing Amplitude: 2.5 V
Lead Channel Setting Pacing Amplitude: 2.75 V
Lead Channel Setting Pacing Pulse Width: 0.4 ms
Lead Channel Setting Sensing Sensitivity: 2 mV

## 2013-10-17 ENCOUNTER — Other Ambulatory Visit: Payer: Self-pay | Admitting: Cardiology

## 2013-10-25 ENCOUNTER — Encounter: Payer: Self-pay | Admitting: *Deleted

## 2013-12-08 ENCOUNTER — Other Ambulatory Visit: Payer: Self-pay | Admitting: Cardiology

## 2013-12-27 ENCOUNTER — Encounter: Payer: Self-pay | Admitting: Cardiology

## 2013-12-27 NOTE — Progress Notes (Signed)
Clinical Summary Mr. Hopfensperger is a medically complex 80 y.o.male last seen in February of this year. Interval followup continues in the device clinic. Patient noted to be in AF 4% of the time, no high ventricular rates.   He is here with his wife today. Reports no palpitations. Having trouble with unsteadiness, using a walker, he still has had some falls without injury. He reports no major bleeding problems on Coumadin, followed by Dr. Alfonse Spruce. Sounds like he had a recent supratherapeutic INR, dose being adjusted. In addition he has chronic leg edema, has been using compression since I last saw him, also stays on Lasix. He has cor pulmonale with pulmonary hypertension. He will be having a device interrogation in the near future.  Echocardiogram from June 2014 demonstrated normal LV chamber size with mild LVH, LVEF 55-60% with basal inferior hypokinesis, bioprosthetic aortic valve with increased gradient but no regurgitation - mean gradient 28 mm mercury, stable mitral valve repair, gradient consistent with mild stenosis and trivial regurgitation - mean gradient 6 mm mercury, severely dilated left atrium, severely dilated right ventricle with pacer wire noted and mild to moderate reduction in function, severely dilated right atrium, moderate tricuspid regurgitation with RVSP 58 mmHg, increased CVP.    No Known Allergies  Current Outpatient Prescriptions  Medication Sig Dispense Refill  . amLODipine (NORVASC) 10 MG tablet TAKE 1 TABLET BY MOUTH DAILY  30 tablet  6  . benazepril (LOTENSIN) 40 MG tablet TAKE 1 TABLET BY MOUTH DAILY  30 tablet  6  . carvedilol (COREG) 3.125 MG tablet Take 1 tablet (3.125 mg total) by mouth 2 (two) times daily.  60 tablet  6  . Elastic Bandages & Supports (MEDICAL COMPRESSION STOCKINGS) MISC 1 each by Does not apply route as directed.  1 each  0  . Ferrous Fumarate-Vitamin C (VITRON-C PO) Take 1.5 tablets by mouth daily.       . furosemide (LASIX) 40 MG tablet TAKE 2  TABLETS ON MONDAY, WEDNESDAY, AND FRIDAY AND 1 TABLET ALL OTHER DAYS  60 tablet  6  . KLOR-CON M20 20 MEQ tablet TAKE 1 TABLET BY MOUTH DAILY  30 tablet  6  . Multiple Vitamins-Minerals (THEREMS M PO) Take 1 tablet by mouth daily.        . nitroGLYCERIN (NITROSTAT) 0.4 MG SL tablet Place 1 tablet (0.4 mg total) under the tongue every 5 (five) minutes as needed.  25 tablet  3  . PACERONE 200 MG tablet TAKE 1 TABLET BY MOUTH DAILY  30 tablet  6  . pravastatin (PRAVACHOL) 40 MG tablet TAKE 1 TABLET BY MOUTH EVERY EVENING  30 tablet  6  . psyllium (METAMUCIL) 58.6 % packet Take 1 packet by mouth daily.        Marland Kitchen warfarin (COUMADIN) 3 MG tablet Take 3 mg by mouth as directed. Managed by Dr. Alfonse Spruce       No current facility-administered medications for this visit.    Past Medical History  Diagnosis Date  . Symptomatic bradycardia   . Atrial flutter     Recurrent, left side. Amiodarone therapy, coumadin therapy, prior history of cardioversion and overdrive pacing; maintenance on normal sinus rhythm  . Cardiomyopathy     Mixed ischemic and nonischemic with previous EF of 35-40%, most recently in June 2009 normalized to 55-60%. Echo 2/11 EF 55-60%  . Coronary atherosclerosis of native coronary artery     Status post coronary bypass grafting  . Valvular heart disease  Status post by prosthetic aortic valve and mitral valve repair  . Endocarditis     s/p PPM extraction by Dr Lovena Le 2012 endocarditis with enterococcus faecalis  . Paroxysmal atrial fibrillation     Amiodarone therapy, status post Cox-Maze procedure but with recurrent atrial fibrillation while on amiodarone August 2012  . Carotid artery disease     Less than 50% bilateral internal carotid artery stenosis by Doppler 2009  . Essential hypertension, benign   . Benign prostatic hyperplasia   . Carpal tunnel syndrome   . Warfarin anticoagulation     Past Surgical History  Procedure Laterality Date  . Coronary artery bypass graft   2008    x2  . Total hip arthroplasty      Began at age 43, replacement age 12, age 34  . Knee arthroscopy    . Carpal tunnel release  2009  . Pacemaker insertion  2009    Initially implanted at [facility], extracted by Dr Lovena Le for endocarditis and reimplanted 8/12  . Aortic valve replacement    . Tonsillectomy  1939  . Hernia repair  age 35  . Patella fracture surgery  age 50  . Cataract extraction, bilateral  07/2011    Social History Mr. Savoca reports that he quit smoking about 59 years ago. His smoking use included Cigarettes. He has a 2.4 pack-year smoking history. He has never used smokeless tobacco. Mr. Stoiber reports that he does not drink alcohol.  Review of Systems Outlined above, otherwise negative.  Physical Examination Filed Vitals:   12/28/13 1123  BP: 135/68  Pulse: 60   Filed Weights   12/28/13 1123  Weight: 185 lb 6.4 oz (84.097 kg)    Comfortable at rest.  HEENT: Conjunctiva and lids normal, oropharynx clear.  Neck: Supple, no elevated JVP, no thyromegaly.  Lungs: Clear to auscultation, nonlabored breathing at rest.  Cardiac: Regular rate and rhythm, no S3, 3/6 systolic murmur at base and apex, paradoxically split S2, no pericardial rub.  Abdomen: Soft, nontender, protuberent, bowel sounds present, no guarding or rebound.  Extremities: Bilateral compression hose in place, distal pulses 1-2+.  Skin: Warm and dry.  Musculoskeletal: No kyphosis.  Neuropsychiatric: Alert and oriented x3, affect grossly appropriate.   Problem List and Plan   Paroxysmal atrial fibrillation History of atrial flutter and fibrillation, continues on amiodarone. Ventricular paced rhythm by ECG today, 4% atrial arrhythmia by most recent device interrogation.  Coronary atherosclerosis of native coronary artery Multivessel disease status post CABG, symptomatically stable on medical therapy.  Secondary cardiomyopathy, unspecified LVEF 55-60% on medical therapy by most recent  assessment. He does have RV dysfunction with chronic leg edema. Has compression hose and continues on Lasix. We have discussed adjustment in dose based on weight changes and worsening leg edema.  Valvular heart disease Status post prosthetic aortic and mitral valve surgery, on chronic Coumadin which is followed by Dr. Alfonse Spruce. Valve gradients outlined above by most recent echocardiogram, continue medical therapy and observation for now.  Sick sinus syndrome Status post pacemaker placement with extraction secondary to endocarditis and reimplantation 2012. Keep follow up with Dr. Rayann Heman.    Satira Sark, M.D., F.A.C.C.

## 2013-12-28 ENCOUNTER — Ambulatory Visit (INDEPENDENT_AMBULATORY_CARE_PROVIDER_SITE_OTHER): Payer: Medicare Other | Admitting: Cardiology

## 2013-12-28 ENCOUNTER — Encounter: Payer: Self-pay | Admitting: Cardiology

## 2013-12-28 VITALS — BP 135/68 | HR 60 | Ht 66.0 in | Wt 185.4 lb

## 2013-12-28 DIAGNOSIS — I38 Endocarditis, valve unspecified: Secondary | ICD-10-CM

## 2013-12-28 DIAGNOSIS — I429 Cardiomyopathy, unspecified: Secondary | ICD-10-CM

## 2013-12-28 DIAGNOSIS — I2510 Atherosclerotic heart disease of native coronary artery without angina pectoris: Secondary | ICD-10-CM

## 2013-12-28 DIAGNOSIS — I495 Sick sinus syndrome: Secondary | ICD-10-CM

## 2013-12-28 DIAGNOSIS — I480 Paroxysmal atrial fibrillation: Secondary | ICD-10-CM

## 2013-12-28 DIAGNOSIS — I4892 Unspecified atrial flutter: Secondary | ICD-10-CM

## 2013-12-28 DIAGNOSIS — I4891 Unspecified atrial fibrillation: Secondary | ICD-10-CM

## 2013-12-28 MED ORDER — NITROGLYCERIN 0.4 MG SL SUBL
0.4000 mg | SUBLINGUAL_TABLET | SUBLINGUAL | Status: DC | PRN
Start: 2013-12-28 — End: 2018-01-25

## 2013-12-28 NOTE — Assessment & Plan Note (Signed)
History of atrial flutter and fibrillation, continues on amiodarone. Ventricular paced rhythm by ECG today, 4% atrial arrhythmia by most recent device interrogation.

## 2013-12-28 NOTE — Assessment & Plan Note (Signed)
LVEF 55-60% on medical therapy by most recent assessment. He does have RV dysfunction with chronic leg edema. Has compression hose and continues on Lasix. We have discussed adjustment in dose based on weight changes and worsening leg edema.

## 2013-12-28 NOTE — Assessment & Plan Note (Signed)
Status post prosthetic aortic and mitral valve surgery, on chronic Coumadin which is followed by Dr. Alfonse Spruce. Valve gradients outlined above by most recent echocardiogram, continue medical therapy and observation for now.

## 2013-12-28 NOTE — Assessment & Plan Note (Signed)
Multivessel disease status post CABG, symptomatically stable on medical therapy.

## 2013-12-28 NOTE — Addendum Note (Signed)
Addended by: Merlene Laughter on: 12/28/2013 11:46 AM   Modules accepted: Orders

## 2013-12-28 NOTE — Patient Instructions (Signed)
Your physician recommends that you schedule a follow-up appointment in: 4 months. You will receive a reminder letter in the mail in about 2 months reminding you to call and schedule your appointment. If you don't receive this letter, please contact our office. Your physician recommends that you continue on your current medications as directed. Please refer to the Current Medication list given to you today. 

## 2013-12-28 NOTE — Assessment & Plan Note (Signed)
Status post pacemaker placement with extraction secondary to endocarditis and reimplantation 2012. Keep follow up with Dr. Rayann Heman.

## 2014-01-01 ENCOUNTER — Ambulatory Visit (INDEPENDENT_AMBULATORY_CARE_PROVIDER_SITE_OTHER): Payer: Medicare Other | Admitting: *Deleted

## 2014-01-01 ENCOUNTER — Telehealth: Payer: Self-pay | Admitting: Cardiology

## 2014-01-01 DIAGNOSIS — I495 Sick sinus syndrome: Secondary | ICD-10-CM

## 2014-01-01 DIAGNOSIS — I429 Cardiomyopathy, unspecified: Secondary | ICD-10-CM

## 2014-01-01 LAB — MDC_IDC_ENUM_SESS_TYPE_REMOTE
Battery Impedance: 251 Ohm
Battery Remaining Longevity: 95 mo
Battery Voltage: 2.78 V
Brady Statistic AP VP Percent: 24 %
Brady Statistic AP VS Percent: 4 %
Brady Statistic AS VP Percent: 48 %
Brady Statistic AS VS Percent: 24 %
Date Time Interrogation Session: 20150608181052
Lead Channel Impedance Value: 460 Ohm
Lead Channel Impedance Value: 463 Ohm
Lead Channel Pacing Threshold Amplitude: 1.375 V
Lead Channel Pacing Threshold Pulse Width: 0.4 ms
Lead Channel Sensing Intrinsic Amplitude: 0.7 mV
Lead Channel Sensing Intrinsic Amplitude: 5.6 mV
Lead Channel Setting Pacing Amplitude: 2.5 V
Lead Channel Setting Pacing Amplitude: 2.75 V
Lead Channel Setting Pacing Pulse Width: 0.4 ms
Lead Channel Setting Sensing Sensitivity: 2 mV

## 2014-01-01 NOTE — Telephone Encounter (Signed)
Spoke with pt and reminded pt of remote transmission that is due today. Pt verbalized understanding.   

## 2014-01-01 NOTE — Progress Notes (Signed)
Remote pacemaker transmission.   

## 2014-01-23 ENCOUNTER — Encounter: Payer: Self-pay | Admitting: Cardiology

## 2014-02-19 ENCOUNTER — Other Ambulatory Visit: Payer: Self-pay | Admitting: *Deleted

## 2014-02-19 MED ORDER — BENAZEPRIL HCL 40 MG PO TABS: ORAL_TABLET | ORAL | Status: DC

## 2014-03-23 ENCOUNTER — Encounter: Payer: Medicare Other | Admitting: Internal Medicine

## 2014-03-28 ENCOUNTER — Encounter: Payer: Self-pay | Admitting: Internal Medicine

## 2014-04-03 ENCOUNTER — Other Ambulatory Visit: Payer: Self-pay | Admitting: *Deleted

## 2014-04-03 ENCOUNTER — Other Ambulatory Visit: Payer: Self-pay | Admitting: Adult Health

## 2014-04-03 MED ORDER — AMIODARONE HCL 200 MG PO TABS: 200.0000 mg | ORAL_TABLET | Freq: Every day | ORAL | Status: DC

## 2014-04-03 MED ORDER — POTASSIUM CHLORIDE CRYS ER 20 MEQ PO TBCR: 20.0000 meq | EXTENDED_RELEASE_TABLET | Freq: Every day | ORAL | Status: DC

## 2014-04-03 MED ORDER — PRAVASTATIN SODIUM 40 MG PO TABS: 40.0000 mg | ORAL_TABLET | Freq: Every day | ORAL | Status: DC

## 2014-04-03 NOTE — Telephone Encounter (Signed)
[place] pt. 

## 2014-04-04 ENCOUNTER — Other Ambulatory Visit: Payer: Self-pay | Admitting: Adult Health

## 2014-04-04 ENCOUNTER — Other Ambulatory Visit: Payer: Self-pay | Admitting: *Deleted

## 2014-04-04 MED ORDER — AMIODARONE HCL 200 MG PO TABS
200.0000 mg | ORAL_TABLET | Freq: Every day | ORAL | Status: DC
Start: 2014-04-04 — End: 2015-05-22

## 2014-04-18 ENCOUNTER — Encounter: Payer: Self-pay | Admitting: Internal Medicine

## 2014-04-25 ENCOUNTER — Encounter: Payer: Medicare Other | Admitting: Internal Medicine

## 2014-04-28 ENCOUNTER — Other Ambulatory Visit: Payer: Self-pay | Admitting: Cardiology

## 2014-05-07 ENCOUNTER — Other Ambulatory Visit: Payer: Self-pay | Admitting: Cardiology

## 2014-05-07 ENCOUNTER — Ambulatory Visit (INDEPENDENT_AMBULATORY_CARE_PROVIDER_SITE_OTHER): Payer: Medicare Other | Admitting: Cardiology

## 2014-05-07 ENCOUNTER — Encounter: Payer: Self-pay | Admitting: Cardiology

## 2014-05-07 VITALS — BP 131/65 | HR 60 | Ht 66.0 in | Wt 181.0 lb

## 2014-05-07 DIAGNOSIS — I495 Sick sinus syndrome: Secondary | ICD-10-CM

## 2014-05-07 DIAGNOSIS — I480 Paroxysmal atrial fibrillation: Secondary | ICD-10-CM

## 2014-05-07 DIAGNOSIS — I2510 Atherosclerotic heart disease of native coronary artery without angina pectoris: Secondary | ICD-10-CM

## 2014-05-07 DIAGNOSIS — I38 Endocarditis, valve unspecified: Secondary | ICD-10-CM

## 2014-05-07 NOTE — Assessment & Plan Note (Signed)
History of atrial flutter and fibrillation. No changes to current regimen.

## 2014-05-07 NOTE — Assessment & Plan Note (Signed)
No active angina symptoms.

## 2014-05-07 NOTE — Patient Instructions (Signed)
Your physician recommends that you schedule a follow-up appointment in: 4 months. You will receive a reminder letter in the mail in about 2 months reminding you to call and schedule your appointment. If you don't receive this letter, please contact our office. Your physician recommends that you continue on your current medications as directed. Please refer to the Current Medication list given to you today. 

## 2014-05-07 NOTE — Assessment & Plan Note (Signed)
Keep device followup with Dr. Rayann Heman.

## 2014-05-07 NOTE — Assessment & Plan Note (Signed)
Status post prosthetic aortic and mitral valve surgery, on chronic Coumadin which is followed by Dr. Alfonse Spruce. Valve gradients outlined above by most recent echocardiogram, continue medical therapy and observation for now. No significant change on examination.

## 2014-05-07 NOTE — Progress Notes (Signed)
Clinical Summary [name] is a medically complex 80 y.o.male last seen in June. Device followup noted in the interim. He is here with his wife. Seems to be doing fairly well. Mainly limited by knee arthritis. He denies any palpitations or chest pain, has stable dyspnea on exertion.  Recent lab work obtained by primary care showed potassium 4.0, BUN 28, creatinine 1.2, hemoglobin 13.3, normal AST and ALT. He denies any bleeding problems on Coumadin, followed by Dr. Alfonse Spruce.  Echocardiogram from June 2014 demonstrated normal LV chamber size with mild LVH, LVEF 55-60% with basal inferior hypokinesis, bioprosthetic aortic valve with increased gradient but no regurgitation - mean gradient 28 mm mercury, stable mitral valve repair, gradient consistent with mild stenosis and trivial regurgitation - mean gradient 6 mm mercury, severely dilated left atrium, severely dilated right ventricle with pacer wire noted and mild to moderate reduction in function, severely dilated right atrium, moderate tricuspid regurgitation with RVSP 58 mmHg, increased CVP.   No Known Allergies  Current Outpatient Prescriptions  Medication Sig Dispense Refill  . amiodarone (PACERONE) 200 MG tablet Take 1 tablet (200 mg total) by mouth daily.  30 tablet  6  . amLODipine (NORVASC) 10 MG tablet TAKE 1 TABLET BY MOUTH DAILY  30 tablet  6  . benazepril (LOTENSIN) 40 MG tablet TAKE 1 TABLET BY MOUTH DAILY  30 tablet  6  . carvedilol (COREG) 3.125 MG tablet TAKE 1 TABLET (3.125 MG TOTAL) BY MOUTH 2 (TWO) TIMES DAILY.  60 tablet  6  . Elastic Bandages & Supports (MEDICAL COMPRESSION STOCKINGS) MISC 1 each by Does not apply route as directed.  1 each  0  . Ferrous Fumarate-Vitamin C (VITRON-C PO) Take 1.5 tablets by mouth daily.       . furosemide (LASIX) 40 MG tablet TAKE 2 TABLETS ON MONDAY, WEDNESDAY, AND FRIDAY AND 1 TABLET ALL OTHER DAYS  60 tablet  6  . Multiple Vitamins-Minerals (THEREMS M PO) Take 1 tablet by mouth daily.         . nitroGLYCERIN (NITROSTAT) 0.4 MG SL tablet Place 1 tablet (0.4 mg total) under the tongue every 5 (five) minutes as needed. Up to 3 doses, if no relief after 3rd dose, proceed to the ED for an evaluation.  25 tablet  3  . potassium chloride SA (KLOR-CON M20) 20 MEQ tablet Take 1 tablet (20 mEq total) by mouth daily.  30 tablet  6  . pravastatin (PRAVACHOL) 40 MG tablet Take 1 tablet (40 mg total) by mouth daily.  30 tablet  6  . psyllium (METAMUCIL) 58.6 % packet Take 1 packet by mouth daily.        Marland Kitchen warfarin (COUMADIN) 3 MG tablet Take 3 mg by mouth as directed. Managed by Dr. Alfonse Spruce       No current facility-administered medications for this visit.    Past Medical History  Diagnosis Date  . Symptomatic bradycardia   . Atrial flutter     Recurrent, left side. Amiodarone therapy, coumadin therapy, prior history of cardioversion and overdrive pacing; maintenance on normal sinus rhythm  . Cardiomyopathy     Mixed ischemic and nonischemic with previous EF of 35-40%, most recently in June 2009 normalized to 55-60%. Echo 2/11 EF 55-60%  . Coronary atherosclerosis of native coronary artery     Status post coronary bypass grafting  . Valvular heart disease     Status post by prosthetic aortic valve and mitral valve repair  . 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 Paroxysmal atrial fibrillation     Amiodarone therapy, status post Cox-Maze procedure but with recurrent atrial fibrillation while on amiodarone August 2012  . Carotid artery disease     Less than 50% bilateral internal carotid artery stenosis by Doppler 2009  . Essential hypertension, benign   . Benign prostatic hyperplasia   . Carpal tunnel syndrome   . Warfarin anticoagulation     Past Surgical History  Procedure Laterality Date  . Coronary artery bypass graft  2008    x2  . Total hip arthroplasty      Began at age 36, replacement age 29, age 59  . Knee  arthroscopy    . Carpal tunnel release  2009  . Pacemaker insertion  2009    Initially implanted at [facility], extracted by Dr Lovena Le for endocarditis and reimplanted 8/12  . Aortic valve replacement    . Tonsillectomy  1939  . Hernia repair  age 43  . Patella fracture surgery  age 29  . Cataract extraction, bilateral  07/2011    Social History Mr. Lamarque reports that he quit smoking about 59 years ago. His smoking use included Cigarettes. He started smoking about 70 years ago. He has a 2.4 pack-year smoking history. He has never used smokeless tobacco. Mr. Maalouf reports that he does not drink alcohol.  Review of Systems Other systems reviewed and negative.  Physical Examination Filed Vitals:   05/07/14 1011  BP: 131/65  Pulse: 60   Filed Weights   05/07/14 1011  Weight: 181 lb (82.101 kg)    Comfortable at rest.  HEENT: Conjunctiva and lids normal, oropharynx clear.  Neck: Supple, no elevated JVP, no thyromegaly.  Lungs: Clear to auscultation, nonlabored breathing at rest.  Cardiac: Regular rate and rhythm, no S3, 3/6 systolic murmur at base and apex, paradoxically split S2, no pericardial rub.  Abdomen: Soft, nontender, protuberent, bowel sounds present, no guarding or rebound.  Extremities: Bilateral compression hose in place, distal pulses 1-2+.  Skin: Warm and dry.  Musculoskeletal: No kyphosis.  Neuropsychiatric: Alert and oriented x3, affect grossly appropriate.   Problem List and Plan   Paroxysmal atrial fibrillation History of atrial flutter and fibrillation. No changes to current regimen.  Sick sinus syndrome Keep device followup with Dr. Rayann Heman.  Valvular heart disease Status post prosthetic aortic and mitral valve surgery, on chronic Coumadin which is followed by Dr. Alfonse Spruce. Valve gradients outlined above by most recent echocardiogram, continue medical therapy and observation for now. No significant change on examination.  Coronary atherosclerosis of native  coronary artery No active angina symptoms.    Satira Sark, M.D., F.A.C.C.

## 2014-05-16 ENCOUNTER — Ambulatory Visit (INDEPENDENT_AMBULATORY_CARE_PROVIDER_SITE_OTHER): Payer: Medicare Other | Admitting: Internal Medicine

## 2014-05-16 ENCOUNTER — Encounter: Payer: Self-pay | Admitting: Internal Medicine

## 2014-05-16 VITALS — BP 142/74 | HR 60 | Ht 67.0 in | Wt 186.4 lb

## 2014-05-16 DIAGNOSIS — I2510 Atherosclerotic heart disease of native coronary artery without angina pectoris: Secondary | ICD-10-CM

## 2014-05-16 DIAGNOSIS — I10 Essential (primary) hypertension: Secondary | ICD-10-CM

## 2014-05-16 DIAGNOSIS — I495 Sick sinus syndrome: Secondary | ICD-10-CM

## 2014-05-16 DIAGNOSIS — Z950 Presence of cardiac pacemaker: Secondary | ICD-10-CM

## 2014-05-16 DIAGNOSIS — I480 Paroxysmal atrial fibrillation: Secondary | ICD-10-CM

## 2014-05-16 LAB — MDC_IDC_ENUM_SESS_TYPE_INCLINIC
Battery Impedance: 325 Ohm
Battery Remaining Longevity: 74 mo
Battery Voltage: 2.77 V
Brady Statistic AP VP Percent: 31 %
Brady Statistic AP VS Percent: 2 %
Brady Statistic AS VP Percent: 51 %
Brady Statistic AS VS Percent: 16 %
Date Time Interrogation Session: 20151021135853
Lead Channel Impedance Value: 447 Ohm
Lead Channel Impedance Value: 455 Ohm
Lead Channel Pacing Threshold Amplitude: 1 V
Lead Channel Pacing Threshold Amplitude: 1.25 V
Lead Channel Pacing Threshold Pulse Width: 0.4 ms
Lead Channel Pacing Threshold Pulse Width: 0.4 ms
Lead Channel Sensing Intrinsic Amplitude: 0.35 mV
Lead Channel Sensing Intrinsic Amplitude: 4 mV
Lead Channel Setting Pacing Amplitude: 2.5 V
Lead Channel Setting Pacing Amplitude: 2.5 V
Lead Channel Setting Pacing Pulse Width: 0.4 ms
Lead Channel Setting Sensing Sensitivity: 2 mV

## 2014-05-16 NOTE — Progress Notes (Signed)
PCP:  [name], MD Primary Cardiologist:  [name]  The patient presents today for routine electrophysiology followup.  Since last being seen in our clinic, the patient reports doing reasonably well.  He is very unsteady and has had several falls.  He now walks with a walker.  Today, he denies symptoms of palpitations, chest pain, shortness of breath, orthopnea, PND, presyncope, syncope, or further neurologic sequela.  The patient feels that he is tolerating medications without difficulties and is otherwise without complaint today.   Past Medical History  Diagnosis Date  . Symptomatic bradycardia   . Atrial flutter     Recurrent, left side. Amiodarone therapy, coumadin therapy, prior history of cardioversion and overdrive pacing; maintenance on normal sinus rhythm  . Cardiomyopathy     Mixed ischemic and nonischemic with previous EF of 35-40%, most recently in June 2009 normalized to 55-60%. Echo 2/11 EF 55-60%  . Coronary atherosclerosis of native coronary artery     Status post coronary bypass grafting  . Valvular heart disease     Status post by prosthetic aortic valve and mitral valve repair  . Endocarditis     s/p PPM extraction by Dr Lovena Le 2012 endocarditis with enterococcus faecalis  . Paroxysmal atrial fibrillation     Amiodarone therapy, status post Cox-Maze procedure but with recurrent atrial fibrillation while on amiodarone August 2012  . Carotid artery disease     Less than 50% bilateral internal carotid artery stenosis by Doppler 2009  . Essential hypertension, benign   . Benign prostatic hyperplasia   . Carpal tunnel syndrome   . Warfarin anticoagulation    Past Surgical History  Procedure Laterality Date  . Coronary artery bypass graft  2008    x2  . Total hip arthroplasty      Began at age 17, replacement age 61, age 42  . Knee arthroscopy    . Carpal tunnel release  2009  . Pacemaker insertion  2009    Initially implanted at [facility], extracted by Dr  Lovena Le for endocarditis and reimplanted 8/12  . Aortic valve replacement    . Tonsillectomy  1939  . Hernia repair  age 41  . Patella fracture surgery  age 48  . Cataract extraction, bilateral  07/2011    Current Outpatient Prescriptions  Medication Sig Dispense Refill  . amiodarone (PACERONE) 200 MG tablet Take 1 tablet (200 mg total) by mouth daily.  30 tablet  6  . amLODipine (NORVASC) 10 MG tablet TAKE 1 TABLET BY MOUTH DAILY  30 tablet  6  . benazepril (LOTENSIN) 40 MG tablet TAKE 1 TABLET BY MOUTH DAILY  30 tablet  6  . carvedilol (COREG) 3.125 MG tablet TAKE 1 TABLET (3.125 MG TOTAL) BY MOUTH 2 (TWO) TIMES DAILY.  60 tablet  6  . Elastic Bandages & Supports (MEDICAL COMPRESSION STOCKINGS) MISC 1 each by Does not apply route as directed.  1 each  0  . Ferrous Fumarate-Vitamin C (VITRON-C PO) Take 1.5 tablets by mouth daily.       . furosemide (LASIX) 40 MG tablet TAKE 2 TABLETS ON MONDAY, WEDNESDAY, AND FRIDAY AND 1 TABLET ALL OTHER DAYS  60 tablet  6  . Multiple Vitamins-Minerals (THEREMS M PO) Take 1 tablet by mouth daily.        . nitroGLYCERIN (NITROSTAT) 0.4 MG SL tablet Place 1 tablet (0.4 mg total) under the tongue every 5 (five) minutes as needed. Up to 3 doses, if no relief after 3rd  dose, proceed to the ED for an evaluation.  25 tablet  3  . potassium chloride SA (KLOR-CON M20) 20 MEQ tablet Take 1 tablet (20 mEq total) by mouth daily.  30 tablet  6  . pravastatin (PRAVACHOL) 40 MG tablet Take 1 tablet (40 mg total) by mouth daily.  30 tablet  6  . psyllium (METAMUCIL) 58.6 % packet Take 1 packet by mouth daily.        Marland Kitchen warfarin (COUMADIN) 3 MG tablet Take 3 mg by mouth as directed. Managed by Dr. Alfonse Spruce       No current facility-administered medications for this visit.    No Known Allergies  History   Social History  . Marital Status: Married    Spouse Name: N/A    Number of Children: N/A  . Years of Education: N/A   Occupational History  . Not on file.    Social History Main Topics  . Smoking status: Former Smoker -- 0.30 packs/day for 8 years    Types: Cigarettes    Start date: 03/25/1944    Quit date: 07/27/1954  . Smokeless tobacco: Never Used  . Alcohol Use: No  . Drug Use: No  . Sexual Activity: Not on file   Other Topics Concern  . Not on file   Social History Narrative  . No narrative on file    Family History  Problem Relation Age of Onset  . Diabetes Neg Hx   . Hypertension Neg Hx   . Coronary artery disease Neg Hx   . Cancer Mother   . Other Father     Heart attack or stroke   ROS- all systems are reviewed and negative except as per HPI above  Physical Exam: Filed Vitals:   05/16/14 1344  BP: 142/74  Pulse: 60  Height: 5\' 7"  (1.702 m)  Weight: 186 lb 6.4 oz (84.55 kg)    GEN- The patient is elderly appearing, alert and oriented x 3 today.   Head- normocephalic, atraumatic Eyes-  Sclera clear, conjunctiva pink Ears- hearing intact Oropharynx- clear Neck- supple  Lungs- Clear to ausculation bilaterally, normal work of breathing Chest- pacemaker pocket is well healed, old L sided pocket also well healed Heart- Regular rate and rhythm, 2/6 SEM LUSB (early peaking), 2/6 SEM at the apex GI- soft, NT, ND, + BS Extremities- no clubbing, cyanosis, trace edema  Pacemaker interrogation- reviewed in detail today,  See PACEART report ekg today reveals AV pacing, low p wave amplitude  Assessment and Plan:  1. Sick sinus syndrome/ symptomatic bradycadria Normal pacemaker function See Pace Art report No changes today  2. afib 1.8% by pacemaker interrogation.  I think he has very frequent but short episodes of afib.  Possibly undersensing afib at times. Continue current regimen including anticoagulation with coumadin. LFTs/TFTs twice per year on amiodarone.  He wishes to have these followed by Dr Alfonse Spruce.  The importance of making sure that this gets done was discussed with the patient and his wife  today.  3. Hypertensive cardiovascular disease Stable No change required today   Return to the device clinic in 1 year [facility]

## 2014-05-16 NOTE — Patient Instructions (Signed)
Your physician wants you to follow-up in: 12 months with Dr Rayann Heman in [place] will receive a reminder letter in the mail two months in advance. If you don't receive a letter, please call our office to schedule the follow-up appointment.  Remote monitoring is used to monitor your Pacemaker or ICD from home. This monitoring reduces the number of office visits required to check your device to one time per year. It allows [place] to keep an eye on the functioning of your device to ensure it is working properly. You are scheduled for a device check from home on 08/20/14. You may send your transmission at any time that day. If you have a wireless device, the transmission will be sent automatically. After your physician reviews your transmission, you will receive a postcard with your next transmission date.

## 2014-05-18 ENCOUNTER — Encounter: Payer: Medicare Other | Admitting: Internal Medicine

## 2014-07-09 ENCOUNTER — Other Ambulatory Visit: Payer: Self-pay | Admitting: Cardiology

## 2014-08-11 ENCOUNTER — Other Ambulatory Visit: Payer: Self-pay | Admitting: Cardiology

## 2014-08-20 ENCOUNTER — Encounter: Payer: Self-pay | Admitting: Internal Medicine

## 2014-08-20 ENCOUNTER — Ambulatory Visit (INDEPENDENT_AMBULATORY_CARE_PROVIDER_SITE_OTHER): Payer: Medicare Other | Admitting: *Deleted

## 2014-08-20 DIAGNOSIS — I495 Sick sinus syndrome: Secondary | ICD-10-CM

## 2014-08-20 NOTE — Progress Notes (Signed)
Remote pacemaker transmission.   

## 2014-08-27 LAB — MDC_IDC_ENUM_SESS_TYPE_REMOTE
Battery Impedance: 375 Ohm
Battery Remaining Longevity: 88 mo
Battery Voltage: 2.78 V
Brady Statistic AP VP Percent: 25 %
Brady Statistic AP VS Percent: 0 %
Brady Statistic AS VP Percent: 72 %
Brady Statistic AS VS Percent: 3 %
Date Time Interrogation Session: 20160125124731
Lead Channel Impedance Value: 430 Ohm
Lead Channel Impedance Value: 436 Ohm
Lead Channel Pacing Threshold Amplitude: 1 V
Lead Channel Pacing Threshold Pulse Width: 0.4 ms
Lead Channel Sensing Intrinsic Amplitude: 1 mV
Lead Channel Setting Pacing Amplitude: 2.5 V
Lead Channel Setting Pacing Amplitude: 2.5 V
Lead Channel Setting Pacing Pulse Width: 0.4 ms
Lead Channel Setting Sensing Sensitivity: 2 mV

## 2014-08-30 ENCOUNTER — Ambulatory Visit (INDEPENDENT_AMBULATORY_CARE_PROVIDER_SITE_OTHER): Payer: Medicare Other | Admitting: Cardiology

## 2014-08-30 ENCOUNTER — Encounter: Payer: Self-pay | Admitting: Cardiology

## 2014-08-30 VITALS — BP 131/64 | HR 61 | Ht 67.0 in | Wt 189.0 lb

## 2014-08-30 DIAGNOSIS — I429 Cardiomyopathy, unspecified: Secondary | ICD-10-CM

## 2014-08-30 DIAGNOSIS — I480 Paroxysmal atrial fibrillation: Secondary | ICD-10-CM

## 2014-08-30 DIAGNOSIS — I38 Endocarditis, valve unspecified: Secondary | ICD-10-CM

## 2014-08-30 DIAGNOSIS — I495 Sick sinus syndrome: Secondary | ICD-10-CM

## 2014-08-30 DIAGNOSIS — I2510 Atherosclerotic heart disease of native coronary artery without angina pectoris: Secondary | ICD-10-CM

## 2014-08-30 MED ORDER — FUROSEMIDE 40 MG PO TABS: 60.0000 mg | ORAL_TABLET | Freq: Every day | ORAL | Status: DC

## 2014-08-30 NOTE — Assessment & Plan Note (Signed)
Status post CABG in 2008, no active angina symptoms. He is not on aspirin since on Coumadin. Otherwise continues on statin, ARB and beta blocker

## 2014-08-30 NOTE — Assessment & Plan Note (Signed)
LVEF normalized as of last assessment 2014. Follow-up echocardiogram ordered. With increasing leg edema we will change Lasix to 60 mg twice daily and continue potassium supplements.

## 2014-08-30 NOTE — Progress Notes (Signed)
Cardiology Office Note  Date: 08/30/2014   ID: Gari, Hartsell 12/21/1928, MRN [id number]  PCP: [name], MD  Primary Cardiologist: Rozann Lesches, MD   Chief Complaint  Patient presents with  . Coronary Artery Disease  . Atrial arrhythmias  . Cardiomyopathy  . Valvular heart disease    History of Present Illness: [name] is a medically complex 81 y.o. male last seen in October 2015. He is here with his wife today for a routine visit. Overall, he reports no major change in functional capacity, NYHA class II dyspnea. He is not having any angina symptoms or obvious sense of palpitations.  Device follow-up continues with Dr. Rayann Heman. He just made a remote transmission. On last assessment had 1.8% atrial fibrillation.  He continues on amiodarone with surveillance lab work per Dr. Alfonse Spruce. He did have LFTs last year but no TSH. Most recent lab work is noted below. He has not had any bleeding problems on Coumadin which is followed by Dr. Alfonse Spruce.  Leg edema has been a worsening problem over the last several weeks to months. He is using compression hose and takes Lasix 80 mg 3 days a week with 40 mg on the other days. No definite orthopnea or PND. Last echocardiogram was in 2014, noted below.   Past Medical History  Diagnosis Date  . Symptomatic bradycardia   . Atrial flutter     Amiodarone therapy, coumadin therapy, prior history of cardioversion and overdrive pacing  . Cardiomyopathy     Mixed ischemic and nonischemic with previous EF of 35-40%, most recently in June 2009 normalized to 55-60%. Echo 2/11 EF 55-60%  . Coronary atherosclerosis of native coronary artery     Status post coronary bypass grafting  . Valvular heart disease     Status post by prosthetic aortic valve and mitral valve repair  . Endocarditis     s/p PPM extraction by Dr Lovena Le 2012 endocarditis with enterococcus faecalis  . Paroxysmal atrial fibrillation     Amiodarone therapy, status post  Cox-Maze procedure but with recurrent atrial fibrillation while on amiodarone August 2012  . Carotid artery disease     Less than 50% bilateral internal carotid artery stenosis by Doppler 2009  . Essential hypertension, benign   . Benign prostatic hyperplasia   . Carpal tunnel syndrome   . Warfarin anticoagulation     Past Surgical History  Procedure Laterality Date  . Coronary artery bypass graft  2008    x2  . Total hip arthroplasty    . Knee arthroscopy    . Carpal tunnel release  2009  . Pacemaker insertion  2009    Initially implanted at [facility], extracted by Dr Lovena Le for endocarditis and reimplanted 8/12  . Aortic valve replacement    . Tonsillectomy  1939  . Hernia repair  age 19  . Patella fracture surgery  age 68  . Cataract extraction, bilateral  07/2011    Current Outpatient Prescriptions  Medication Sig Dispense Refill  . ALOE VERA JUICE PO Take 2 oz by mouth 2 (two) times daily.    Marland Kitchen amiodarone (PACERONE) 200 MG tablet Take 1 tablet (200 mg total) by mouth daily. 30 tablet 6  . amLODipine (NORVASC) 10 MG tablet TAKE 1 TABLET BY MOUTH DAILY 30 tablet 6  . amLODipine (NORVASC) 10 MG tablet TAKE 1 TABLET BY MOUTH DAILY 30 tablet 3  . benazepril (LOTENSIN) 40 MG tablet TAKE 1 TABLET BY MOUTH DAILY 30 tablet  6  . carvedilol (COREG) 3.125 MG tablet TAKE 1 TABLET (3.125 MG TOTAL) BY MOUTH 2 (TWO) TIMES DAILY. 60 tablet 6  . Elastic Bandages & Supports (MEDICAL COMPRESSION STOCKINGS) MISC 1 each by Does not apply route as directed. 1 each 0  . furosemide (LASIX) 40 MG tablet Take 1.5 tablets (60 mg total) by mouth daily. 45 tablet 6  . Iron-Folic Acid-Vit Q25 (IRON FORMULA PO) Take 1 tablet by mouth daily. RAW IRON 22MG  - WHOLE FOOD IRON, NON-CONSTIPATING    . Multiple Vitamins-Minerals (THEREMS M PO) Take 1 tablet by mouth daily.      . nitroGLYCERIN (NITROSTAT) 0.4 MG SL tablet Place 1 tablet (0.4 mg total) under the tongue every 5 (five) minutes as needed. Up to 3 doses, if  no relief after 3rd dose, proceed to the ED for an evaluation. 25 tablet 3  . potassium chloride SA (KLOR-CON M20) 20 MEQ tablet Take 1 tablet (20 mEq total) by mouth daily. 30 tablet 6  . pravastatin (PRAVACHOL) 40 MG tablet Take 1 tablet (40 mg total) by mouth daily. 30 tablet 6  . Probiotic Product (PROBIOTIC DAILY PO) Take 1 tablet by mouth daily.    . psyllium (METAMUCIL) 58.6 % packet Take 1 packet by mouth daily.      Marland Kitchen warfarin (COUMADIN) 3 MG tablet Take 3 mg by mouth as directed. Managed by Dr. Alfonse Spruce     No current facility-administered medications for this visit.    Allergies:  Review of patient's allergies indicates no known allergies.   Social History: The patient  reports that he quit smoking about 60 years ago. His smoking use included Cigarettes. He started smoking about 70 years ago. He has a 2.4 pack-year smoking history. He has never used smokeless tobacco. He reports that he does not drink alcohol or use illicit drugs.   Family History: The patient's family history includes Cancer in his mother; Other in his father. There is no history of Diabetes, Hypertension, or Coronary artery disease.   ROS:  Please see the history of present illness. Otherwise, complete review of systems is positive for difficulty hearing.  All other systems are reviewed and negative.    Physical Exam: VS:  BP 131/64 mmHg  Pulse 61  Ht 5\' 7"  (1.702 m)  Wt 189 lb (85.73 kg)  BMI 29.59 kg/m2  SpO2 93%, BMI Body mass index is 29.59 kg/(m^2).  Wt Readings from Last 3 Encounters:  08/30/14 189 lb (85.73 kg)  05/16/14 186 lb 6.4 oz (84.55 kg)  05/07/14 181 lb (82.101 kg)     Comfortable at rest.  HEENT: Conjunctiva and lids normal, oropharynx clear.  Neck: Supple, no elevated JVP, no thyromegaly.  Lungs: Clear to auscultation, nonlabored breathing at rest.  Cardiac: Regular rate and rhythm, no S3, 3/6 systolic murmur at base and apex, paradoxically split S2, no pericardial rub.   Abdomen: Soft, nontender, protuberent, bowel sounds present, no guarding or rebound.  Extremities: Bilateral compression hose in place with 2+ pitting edema, distal pulses 1-2+.  Skin: Warm and dry.  Musculoskeletal: No kyphosis.  Neuropsychiatric: Alert and oriented x3, affect grossly appropriate.   ECG: ECG is not ordered today.   Recent Labwork:  Lab work per Dr. Alfonse Spruce back in August 2015 showed AST 25, ALT 25, hemoglobin 13.3, platelets 127, BUN 28, currently 1.2.  Other Studies Reviewed Today:  Echocardiogram from June 2014 demonstrated normal LV chamber size with mild LVH, LVEF 55-60% with basal inferior hypokinesis, bioprosthetic aortic valve  with increased gradient but no regurgitation - mean gradient 28 mm mercury, stable mitral valve repair, gradient consistent with mild stenosis and trivial regurgitation - mean gradient 6 mm mercury, severely dilated left atrium, severely dilated right ventricle with pacer wire noted and mild to moderate reduction in function, severely dilated right atrium, moderate tricuspid regurgitation with RVSP 58 mmHg, increased CVP.   Assessment and Plan:  Valvular heart disease Status post prosthetic aortic and mitral valve surgery, on chronic Coumadin which is followed by Dr. Alfonse Spruce. Last echocardiogram was in 2014 with increased aortic valve gradient (mean 20 mmHg), and mildly stenotic mitral gradient status post repair. Follow-up study will be obtained for reassessment, particularly with progressing leg edema.   Coronary atherosclerosis of native coronary artery Status post CABG in 2008, no active angina symptoms. He is not on aspirin since on Coumadin. Otherwise continues on statin, ARB and beta blocker   Paroxysmal atrial fibrillation Continues on amiodarone and Coumadin. Most recent lab work reviewed. We will request follow-up LFTs and TSH through Dr. Jerlyn Ly office.   Sick sinus syndrome Status post pacemaker, followed by Dr.  Rayann Heman.   Secondary cardiomyopathy LVEF normalized as of last assessment 2014. Follow-up echocardiogram ordered. With increasing leg edema we will change Lasix to 60 mg twice daily and continue potassium supplements.     Current medicines are reviewed at length with the patient today.  The patient does not have concerns regarding medicines.   Orders Placed This Encounter  Procedures  . Hepatic function panel  . TSH  . 2D Echocardiogram without contrast    Disposition: FU with me in 6 months.   Signed, Satira Sark, MD, [facility] 08/30/2014 3:33 PM    [place] [facility] [place], [place], [place] [address] Phone: [phone]; Fax: [phone]

## 2014-08-30 NOTE — Assessment & Plan Note (Signed)
Status post pacemaker, followed by Dr. Rayann Heman.

## 2014-08-30 NOTE — Patient Instructions (Signed)
Your physician recommends that you schedule a follow-up appointment in: 4 months. You will receive a reminder letter in the mail in about 1-2 months reminding you to call and schedule your appointment. If you don't receive this letter, please contact our office. Your physician has recommended you make the following change in your medication:  Change furosemide to 60 mg daily. Continue all other medications the same. Your physician recommends that you have lab work at Dr. Jerlyn Ly office to check your liver function and TSH. Your physician has requested that you have an echocardiogram. Echocardiography is a painless test that uses sound waves to create images of your heart. It provides your doctor with information about the size and shape of your heart and how well your heart's chambers and valves are working. This procedure takes approximately one hour. There are no restrictions for this procedure.

## 2014-08-30 NOTE — Assessment & Plan Note (Signed)
Status post prosthetic aortic and mitral valve surgery, on chronic Coumadin which is followed by Dr. Alfonse Spruce. Last echocardiogram was in 2014 with increased aortic valve gradient (mean 20 mmHg), and mildly stenotic mitral gradient status post repair. Follow-up study will be obtained for reassessment, particularly with progressing leg edema.

## 2014-08-30 NOTE — Assessment & Plan Note (Signed)
Continues on amiodarone and Coumadin. Most recent lab work reviewed. We will request follow-up LFTs and TSH through Dr. Jerlyn Ly office.

## 2014-09-03 ENCOUNTER — Other Ambulatory Visit: Payer: Self-pay | Admitting: Adult Health

## 2014-09-06 ENCOUNTER — Telehealth: Payer: Self-pay | Admitting: Cardiology

## 2014-09-06 ENCOUNTER — Other Ambulatory Visit (INDEPENDENT_AMBULATORY_CARE_PROVIDER_SITE_OTHER): Payer: Medicare Other

## 2014-09-06 ENCOUNTER — Other Ambulatory Visit: Payer: Self-pay

## 2014-09-06 DIAGNOSIS — I429 Cardiomyopathy, unspecified: Secondary | ICD-10-CM

## 2014-09-06 DIAGNOSIS — I2510 Atherosclerotic heart disease of native coronary artery without angina pectoris: Secondary | ICD-10-CM

## 2014-09-06 DIAGNOSIS — I495 Sick sinus syndrome: Secondary | ICD-10-CM

## 2014-09-06 DIAGNOSIS — I38 Endocarditis, valve unspecified: Secondary | ICD-10-CM

## 2014-09-06 DIAGNOSIS — I480 Paroxysmal atrial fibrillation: Secondary | ICD-10-CM

## 2014-09-06 NOTE — Telephone Encounter (Signed)
Since changing the  furosemide to 60 mg daily  [name] continues to gain a pound a day. Continues to have swelling in his legs and feet.

## 2014-09-07 ENCOUNTER — Telehealth: Payer: Self-pay | Admitting: *Deleted

## 2014-09-07 MED ORDER — FUROSEMIDE 40 MG PO TABS: 60.0000 mg | ORAL_TABLET | Freq: Two times a day (BID) | ORAL | Status: DC

## 2014-09-07 MED ORDER — FUROSEMIDE 40 MG PO TABS: 80.0000 mg | ORAL_TABLET | Freq: Every day | ORAL | Status: DC

## 2014-09-07 NOTE — Telephone Encounter (Signed)
Reviewed. I meant to indicate Lasix 60 mg daily in the note (not BID), so he has actually been taking it correctly per our discussion at the visit. Would therefore increase to Lasix 60 mg BID for the next 3 days and then change to 80 mg daily. Hopefully this will help with further diuresis.

## 2014-09-07 NOTE — Telephone Encounter (Signed)
-----   Message from Satira Sark, MD sent at 09/07/2014  7:37 AM EST ----- Reviewed report. LV systolic function remains normal range. Bioprosthetic aortic valve has a stable mean gradient compared to previous study 2014, only mild mitral regurgitation. Mildly decreased RV function may also be contributing to leg edema. We will continue medical therapy for now.

## 2014-09-07 NOTE — Telephone Encounter (Signed)
Spoke with wife and she called to inform office that patients weight has continued to climb since dose change. Patient's dry weight today is 186 lbs and patient usually weighs 179 lbs. Patient weighed 182 lbs on 08/30/14. No improvement on leg and feet swelling with dose increase. Patient wears compression as directed daily and watches his sodium and fluid intake carefully per wife. No c/o sob or chest pain.  Recent office note reviewed and states that dose was increased to 60 mg twice daily. Patient has been taking 60 mg daily. Please advise if patient should be on 60 mg twice daily.

## 2014-09-07 NOTE — Telephone Encounter (Addendum)
Wife informed

## 2014-09-07 NOTE — Telephone Encounter (Signed)
Wife informed and verbalized understanding of plan. 

## 2014-09-11 ENCOUNTER — Telehealth: Payer: Self-pay | Admitting: *Deleted

## 2014-09-11 ENCOUNTER — Encounter: Payer: Self-pay | Admitting: *Deleted

## 2014-09-11 NOTE — Telephone Encounter (Signed)
Results aren't available in Care Everywhere at this time. Lab values received from PCP and clarified the results that weren't clear.

## 2014-09-12 ENCOUNTER — Encounter: Payer: Self-pay | Admitting: Cardiology

## 2014-09-12 ENCOUNTER — Other Ambulatory Visit: Payer: Self-pay | Admitting: Cardiology

## 2014-09-13 ENCOUNTER — Telehealth: Payer: Self-pay | Admitting: *Deleted

## 2014-09-13 DIAGNOSIS — I4892 Unspecified atrial flutter: Secondary | ICD-10-CM

## 2014-09-13 DIAGNOSIS — I480 Paroxysmal atrial fibrillation: Secondary | ICD-10-CM

## 2014-09-13 NOTE — Telephone Encounter (Signed)
-----   Message from Satira Sark, MD sent at 09/12/2014  8:47 AM EST ----- Reviewed report. TSH reported at 9.7 (high), AST and ALT normal. I would recommend a free T3 and a T4 level.

## 2014-09-13 NOTE — Telephone Encounter (Signed)
Patient informed.Lab order faxed to [facility] office.

## 2014-09-17 ENCOUNTER — Other Ambulatory Visit: Payer: Self-pay | Admitting: Cardiology

## 2014-09-17 ENCOUNTER — Telehealth: Payer: Self-pay | Admitting: Cardiology

## 2014-09-17 NOTE — Telephone Encounter (Signed)
Received telephone call from Mrs. Palin. Dr. Alfonse Spruce office states that they did not receive lab orders Can you verify?  Fax # [phone] please make sure diagnosis on order.

## 2014-09-17 NOTE — Telephone Encounter (Signed)
[name] would like to receive a telephone call .

## 2014-09-18 NOTE — Telephone Encounter (Signed)
Lab orders re-faxed to Dr. Jerlyn Ly office.

## 2014-09-20 ENCOUNTER — Other Ambulatory Visit: Payer: Self-pay | Admitting: Cardiology

## 2014-10-02 ENCOUNTER — Other Ambulatory Visit: Payer: Self-pay | Admitting: Cardiology

## 2014-10-25 ENCOUNTER — Telehealth: Payer: Self-pay | Admitting: *Deleted

## 2014-10-25 NOTE — Telephone Encounter (Signed)
Patient informed nurse that he is pretty sure that he had this done at Dr. Jerlyn Ly office. Nurse sent request for results.

## 2014-10-30 ENCOUNTER — Telehealth: Payer: Self-pay | Admitting: *Deleted

## 2014-10-30 NOTE — Telephone Encounter (Signed)
-----   Message from Satira Sark, MD sent at 10/30/2014 12:43 PM EDT ----- Outside lab results reviewed. Free thyroxine and free T3 levels are normal.

## 2014-10-30 NOTE — Telephone Encounter (Signed)
Patient informed. 

## 2014-11-01 ENCOUNTER — Other Ambulatory Visit: Payer: Self-pay | Admitting: Cardiology

## 2014-11-02 ENCOUNTER — Other Ambulatory Visit: Payer: Self-pay | Admitting: Cardiology

## 2014-11-15 ENCOUNTER — Telehealth: Payer: Self-pay | Admitting: Internal Medicine

## 2014-11-15 NOTE — Telephone Encounter (Signed)
Informed pt that the area code change will not affect his home monitor. Pt verbalized understanding.

## 2014-11-15 NOTE — Telephone Encounter (Signed)
New Message   Pt wife wanted to be sure that the change in pt's phone number would not interfere w/ remote pacer check sched for 4/26. Pt wife is aware Pt's demo information in EPIC is up to date. Please call back and discuss.

## 2014-11-20 ENCOUNTER — Encounter: Payer: Self-pay | Admitting: Internal Medicine

## 2014-11-20 ENCOUNTER — Ambulatory Visit (INDEPENDENT_AMBULATORY_CARE_PROVIDER_SITE_OTHER): Payer: Medicare Other | Admitting: *Deleted

## 2014-11-20 DIAGNOSIS — I495 Sick sinus syndrome: Secondary | ICD-10-CM | POA: Diagnosis not present

## 2014-11-20 NOTE — Progress Notes (Signed)
Remote pacemaker transmission.   

## 2014-11-22 LAB — MDC_IDC_ENUM_SESS_TYPE_REMOTE
Battery Impedance: 400 Ohm
Battery Remaining Longevity: 85 mo
Battery Voltage: 2.78 V
Brady Statistic AP VP Percent: 33 %
Brady Statistic AP VS Percent: 1 %
Brady Statistic AS VP Percent: 60 %
Brady Statistic AS VS Percent: 5 %
Date Time Interrogation Session: 20160426134558
Lead Channel Impedance Value: 430 Ohm
Lead Channel Impedance Value: 443 Ohm
Lead Channel Pacing Threshold Amplitude: 0.875 V
Lead Channel Pacing Threshold Pulse Width: 0.4 ms
Lead Channel Sensing Intrinsic Amplitude: 0.7 mV
Lead Channel Setting Pacing Amplitude: 2.5 V
Lead Channel Setting Pacing Amplitude: 2.5 V
Lead Channel Setting Pacing Pulse Width: 0.4 ms
Lead Channel Setting Sensing Sensitivity: 2.8 mV

## 2014-11-24 ENCOUNTER — Other Ambulatory Visit: Payer: Self-pay | Admitting: Cardiology

## 2014-11-27 ENCOUNTER — Encounter: Payer: Self-pay | Admitting: Cardiology

## 2014-12-28 ENCOUNTER — Encounter: Payer: Self-pay | Admitting: Cardiology

## 2014-12-28 ENCOUNTER — Ambulatory Visit (INDEPENDENT_AMBULATORY_CARE_PROVIDER_SITE_OTHER): Payer: Medicare Other | Admitting: Cardiology

## 2014-12-28 VITALS — BP 124/64 | HR 60 | Ht 67.0 in | Wt 187.1 lb

## 2014-12-28 DIAGNOSIS — I2510 Atherosclerotic heart disease of native coronary artery without angina pectoris: Secondary | ICD-10-CM

## 2014-12-28 DIAGNOSIS — I38 Endocarditis, valve unspecified: Secondary | ICD-10-CM | POA: Diagnosis not present

## 2014-12-28 DIAGNOSIS — I5032 Chronic diastolic (congestive) heart failure: Secondary | ICD-10-CM | POA: Diagnosis not present

## 2014-12-28 DIAGNOSIS — I480 Paroxysmal atrial fibrillation: Secondary | ICD-10-CM

## 2014-12-28 MED ORDER — FUROSEMIDE 40 MG PO TABS: 80.0000 mg | ORAL_TABLET | Freq: Every morning | ORAL | Status: DC

## 2014-12-28 NOTE — Patient Instructions (Signed)
Your physician has recommended you make the following change in your medication:  Increase furosemide to 80 mg in the morning and 40 mg in the evening. Continue all other medications the same. Your physician recommends that you have lab work in 2 weeks to check your BMET. Your physician recommends that you schedule a follow-up appointment in: 3 months.

## 2014-12-28 NOTE — Progress Notes (Signed)
Cardiology Office Note  Date: 12/28/2014   ID: [name], DOB Jul 26, 1929, MRN [id number]  PCP: Riley Sports, MD  Primary Cardiologist: Riley Lesches, MD   Chief Complaint  Patient presents with  . Coronary Artery Disease  . Valvular heart disease  . PAF    History of Present Illness: [name] is a medically complex 81 y.o. male last seen in February. Since that visit Lasix dose has been increased to help leg edema. He is here with his wife for routine follow-up. Still has trouble with leg edema, has only taken Lasix 80 mg in the morning on most days without any additional doses. His weight is relatively stable. He is also using compression stockings.  He remains functionally limited by chronic back pain and also muscle weakness. He has had no chest pain or palpitations.  Interval follow-up lab work and echocardiogram are reviewed below as well.  Past Medical History  Diagnosis Date  . Symptomatic bradycardia   . Atrial flutter     Amiodarone therapy, coumadin therapy, prior history of cardioversion and overdrive pacing  . Cardiomyopathy     Mixed ischemic and nonischemic with previous EF of 35-40%, most recently in June 2009 normalized to 55-60%. Echo 2/11 EF 55-60%  . Coronary atherosclerosis of native coronary artery     Status post coronary bypass grafting  . Valvular heart disease     Status post by prosthetic aortic valve and mitral valve repair  . 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 Paroxysmal atrial fibrillation     Amiodarone therapy, status post Cox-Maze procedure but with recurrent atrial fibrillation while on amiodarone August 2012  . Carotid artery disease     Less than 50% bilateral internal carotid artery stenosis by Doppler 2009  . Essential hypertension, benign   . Benign prostatic hyperplasia   . Carpal tunnel syndrome   . Warfarin anticoagulation     Past Surgical History    Procedure Laterality Date  . Coronary artery bypass graft  2008    x2  . Total hip arthroplasty    . Knee arthroscopy    . Carpal tunnel release  2009  . Pacemaker insertion  2009    Initially implanted at [facility], extracted by Dr [name] for endocarditis and reimplanted 8/12  . Aortic valve replacement    . Tonsillectomy  1939  . Hernia repair  age 3  . Patella fracture surgery  age 67  . Cataract extraction, bilateral  07/2011    Current Outpatient Prescriptions  Medication Sig Dispense Refill  . ALOE VERA JUICE PO Take 2 oz by mouth 2 (two) times daily.    Marland Kitchen amiodarone (PACERONE) 200 MG tablet Take 1 tablet (200 mg total) by mouth daily. 30 tablet 6  . amLODipine (NORVASC) 10 MG tablet TAKE 1 TABLET BY MOUTH DAILY 30 tablet 6  . benazepril (LOTENSIN) 40 MG tablet TAKE 1 TABLET BY MOUTH DAILY 30 tablet 6  . carvedilol (COREG) 3.125 MG tablet TAKE 1 TABLET (3.125 MG TOTAL) BY MOUTH 2 (TWO) TIMES DAILY. 60 tablet 3  . furosemide (LASIX) 40 MG tablet Take 2 tablets (80 mg total) by mouth every morning. & 40 mg in the evening 270 tablet 3  . Iron-Folic Acid-Vit K27 (IRON FORMULA PO) Take 1 tablet by mouth daily. RAW IRON 22MG  - WHOLE FOOD IRON, NON-CONSTIPATING    . KLOR-CON M20 20 MEQ tablet TAKE  1 TABLET BY MOUTH DAILY 30 tablet 5  . Multiple Vitamins-Minerals (THEREMS M PO) Take 1 tablet by mouth daily.      . nitroGLYCERIN (NITROSTAT) 0.4 MG SL tablet Place 1 tablet (0.4 mg total) under the tongue every 5 (five) minutes as needed. Up to 3 doses, if no relief after 3rd dose, proceed to the ED for an evaluation. 25 tablet 3  . pravastatin (PRAVACHOL) 40 MG tablet TAKE 1 TABLET BY MOUTH EVERY EVENING 30 tablet 6  . Probiotic Product (PROBIOTIC DAILY PO) Take 1 tablet by mouth daily.    . psyllium (METAMUCIL) 58.6 % packet Take 1 packet by mouth daily.      Marland Kitchen warfarin (COUMADIN) 3 MG tablet Take 3 mg by mouth as directed. Managed by Dr. Alfonse Becker     No current facility-administered  medications for this visit.    Allergies:  Review of patient's allergies indicates no known allergies.   Social History: The patient  reports that he quit smoking about 60 years ago. His smoking use included Cigarettes. He started smoking about 70 years ago. He has a 2.4 pack-year smoking history. He has never used smokeless tobacco. He reports that he does not drink alcohol or use illicit drugs.   ROS:  Please see the history of present illness. Otherwise, complete review of systems is positive for occasional dizziness.  All other systems are reviewed and negative.   Physical Exam: VS:  BP 124/64 mmHg  Pulse 60  Ht 5\' 7"  (1.702 m)  Wt 187 lb 1.9 oz (84.877 kg)  BMI 29.30 kg/m2  SpO2 93%, BMI Body mass index is 29.3 kg/(m^2).  Wt Readings from Last 3 Encounters:  12/28/14 187 lb 1.9 oz (84.877 kg)  08/30/14 189 lb (85.73 kg)  05/16/14 186 lb 6.4 oz (84.55 kg)     Comfortable at rest.  HEENT: Conjunctiva and lids normal, oropharynx clear.  Neck: Supple, no elevated JVP, no thyromegaly.  Lungs: Clear to auscultation, nonlabored breathing at rest.  Cardiac: Regular rate and rhythm, no S3, 3/6 systolic murmur at base and apex, paradoxically split S2, no pericardial rub.  Abdomen: Soft, nontender, protuberent, bowel sounds present, no guarding or rebound.  Extremities: Bilateral compression hose in place with 2+ pitting edema, distal pulses 1-2+.  Skin: Warm and dry.  Musculoskeletal: No kyphosis.  Neuropsychiatric: Alert and oriented x3, affect grossly appropriate.   ECG: ECG is not ordered today.   Recent Labwork:  09/28/2014: Free thyroxine 0.93, free T3 2.4 09/07/2014: TSH 9.73, AST 24, ALT 18  Other Studies Reviewed Today:  Echocardiogram 09/06/2014: Study Conclusions  - Left ventricle: The cavity size was normal. There was mild concentric hypertrophy. Systolic function was normal. The estimated ejection fraction was in the range of 60% to 65%. Doppler  parameters are consistent with restrictive physiology, indicative of decreased left ventricular diastolic compliance and/or increased left atrial pressure. Doppler parameters are consistent with high ventricular filling pressure. - Regional wall motion abnormality: Mild hypokinesis of the basal inferoseptal myocardium. - Aortic valve: Bioprosthetic aortic valve with mildly increased peak velocity and mean gradient. Peak velocity 3.41 m/s. There was no regurgitation. Mean gradient (S): 25 mm Hg. - Mitral valve: S/p mitral annuloplasty. Mild stenosis: mean gradient 6 mmHg. Mildly thickened leaflets . There was mild regurgitation. Valve area by pressure half-time: 1.63 cm^2. - Left atrium: The atrium was severely dilated. - Right ventricle: The cavity size was moderately dilated. Pacer wire or catheter noted in right ventricle. Systolic function was mildly  reduced. - Right atrium: The atrium was severely dilated. Pacer wire or catheter noted in right atrium. - Tricuspid valve: There was moderate regurgitation. Mildly elevated pulmonary pressures: 40 mmHg. - Pulmonic valve: There was mild regurgitation. - Systemic veins: IVC is dilated with normal respiratory variation. Estimated CVP 8 mmHg.   Assessment and Plan:  1. Chronic leg edema. This is likely attributable to restrictive LV diastolic filling and RV dysfunction. He will continue to use compression stockings, we have discussed sodium restriction guidelines. Increase Lasix to 80 mg in the morning and 40 mg in the evening, follow-up BMET in 2 weeks. May need to consider switching to [facility].  2. Paroxysmal atrial fibrillation, continues on amiodarone and Coumadin. Follow-up lab work reviewed. Atrial flutter ablation burden has been fairly low.  3. Ischemic heart disease, no active angina symptoms.  4. Valvular heart disease status post prosthetic aortic valve replacement and mitral valve repair. Follow-up  echocardiogram noted above with stable findings.  Current medicines were reviewed with the patient today.   Orders Placed This Encounter  Procedures  . Basic metabolic panel    Disposition: FU with me in 3 months.   Signed, Satira Sark, MD, [facility] 12/28/2014 2:44 PM    [facility] [place], [place], [place] [address] Phone: [phone]; Fax: [phone]

## 2015-01-15 ENCOUNTER — Other Ambulatory Visit: Payer: Self-pay | Admitting: Cardiology

## 2015-01-16 LAB — BASIC METABOLIC PANEL
BUN: 28 mg/dL — ABNORMAL HIGH (ref 6–23)
CO2: 29 mEq/L (ref 19–32)
Calcium: 9.2 mg/dL (ref 8.4–10.5)
Chloride: 105 mEq/L (ref 96–112)
Creat: 1.09 mg/dL (ref 0.50–1.35)
Glucose, Bld: 99 mg/dL (ref 70–99)
Potassium: 4 mEq/L (ref 3.5–5.3)
Sodium: 142 mEq/L (ref 135–145)

## 2015-01-18 ENCOUNTER — Telehealth: Payer: Self-pay | Admitting: *Deleted

## 2015-01-18 NOTE — Telephone Encounter (Signed)
Patient informed. 

## 2015-01-18 NOTE — Telephone Encounter (Signed)
-----   Message from Satira Sark, MD sent at 01/17/2015  5:59 PM EDT ----- BUN mildly elevated with increase in diuretic, but potassium and creatinine normal. Will continue same particularly if leg swelling stable.

## 2015-02-28 ENCOUNTER — Telehealth: Payer: Self-pay | Admitting: Cardiology

## 2015-02-28 ENCOUNTER — Encounter: Payer: Medicare Other | Admitting: *Deleted

## 2015-02-28 NOTE — Telephone Encounter (Signed)
Confirmed remote transmission w/ pt wife.   

## 2015-03-01 ENCOUNTER — Encounter: Payer: Self-pay | Admitting: Cardiology

## 2015-03-11 ENCOUNTER — Encounter: Payer: Self-pay | Admitting: Internal Medicine

## 2015-03-11 ENCOUNTER — Ambulatory Visit (INDEPENDENT_AMBULATORY_CARE_PROVIDER_SITE_OTHER): Payer: Medicare Other | Admitting: *Deleted

## 2015-03-11 DIAGNOSIS — I495 Sick sinus syndrome: Secondary | ICD-10-CM

## 2015-03-11 NOTE — Progress Notes (Signed)
Remote pacemaker transmission.   

## 2015-03-19 LAB — CUP PACEART REMOTE DEVICE CHECK
Battery Impedance: 476 Ohm
Battery Remaining Longevity: 78 mo
Battery Voltage: 2.77 V
Brady Statistic AP VP Percent: 41 %
Brady Statistic AP VS Percent: 1 %
Brady Statistic AS VP Percent: 52 %
Brady Statistic AS VS Percent: 6 %
Date Time Interrogation Session: 20160815143734
Lead Channel Impedance Value: 397 Ohm
Lead Channel Impedance Value: 431 Ohm
Lead Channel Pacing Threshold Amplitude: 0.875 V
Lead Channel Pacing Threshold Pulse Width: 0.4 ms
Lead Channel Sensing Intrinsic Amplitude: 0.7 mV — CL
Lead Channel Setting Pacing Amplitude: 2.5 V
Lead Channel Setting Pacing Amplitude: 2.5 V
Lead Channel Setting Pacing Pulse Width: 0.4 ms
Lead Channel Setting Sensing Sensitivity: 2.8 mV

## 2015-03-28 ENCOUNTER — Ambulatory Visit (INDEPENDENT_AMBULATORY_CARE_PROVIDER_SITE_OTHER): Payer: Medicare Other | Admitting: Cardiology

## 2015-03-28 ENCOUNTER — Encounter: Payer: Self-pay | Admitting: Cardiology

## 2015-03-28 VITALS — BP 135/60 | HR 60 | Ht 67.0 in | Wt 192.1 lb

## 2015-03-28 DIAGNOSIS — R600 Localized edema: Secondary | ICD-10-CM

## 2015-03-28 DIAGNOSIS — I480 Paroxysmal atrial fibrillation: Secondary | ICD-10-CM

## 2015-03-28 DIAGNOSIS — I5032 Chronic diastolic (congestive) heart failure: Secondary | ICD-10-CM | POA: Diagnosis not present

## 2015-03-28 DIAGNOSIS — I2510 Atherosclerotic heart disease of native coronary artery without angina pectoris: Secondary | ICD-10-CM | POA: Diagnosis not present

## 2015-03-28 MED ORDER — FUROSEMIDE 40 MG PO TABS: 120.0000 mg | ORAL_TABLET | Freq: Every morning | ORAL | Status: DC

## 2015-03-28 NOTE — Patient Instructions (Signed)
Your physician has recommended you make the following change in your medication:  Increase furosemide to 120 mg in the morning and 40 mg in the evening.  Continue all other medications the same. Your physician recommends that you return for lab work in 2 weeks around April 11, 2015 to check your BMET. Your physician recommends that you schedule a follow-up appointment in: 3 months.

## 2015-03-28 NOTE — Progress Notes (Signed)
Cardiology Office Note  Date: 03/28/2015   ID: De Hollingshead, DOB February 06, 1929, MRN [id number]  PCP: [name], MD  Primary Cardiologist: Rozann Lesches, MD   Chief Complaint  Patient presents with  . Coronary Artery Disease  . Valvular heart disease  . Atrial arrhythmias    History of Present Illness: [name] is an 81 y.o. male last seen in June. At that time we increased Lasix to 80 mg in the morning and 40 mg in the evening. This is had a fairly minor impact on his leg edema overall, he continues to use compression stockings. He reports fairly chronic fatigue, upper body muscle weakness, also shortness of breath.  Medical history is very complicated as outlined. We reviewed his echocardiogram done earlier in the year. Edema is multifactorial and contributed to by significant diastolic dysfunction as well as right ventricular dysfunction. We discussed further advancing his Lasix and perhaps ultimately switching to [facility] if needed.  He continues on Coumadin and reports no bleeding problems.   Past Medical History  Diagnosis Date  . Symptomatic bradycardia   . Atrial flutter     Amiodarone therapy, coumadin therapy, prior history of cardioversion and overdrive pacing  . Cardiomyopathy     Mixed ischemic and nonischemic with previous EF of 35-40%, most recently in June 2009 normalized to 55-60%. Echo 2/11 EF 55-60%  . Coronary atherosclerosis of native coronary artery     Status post coronary bypass grafting  . Valvular heart disease     Status post by prosthetic aortic valve and mitral valve repair  . Endocarditis     s/p PPM extraction by Dr Lovena Le 2012 endocarditis with enterococcus faecalis  . Paroxysmal atrial fibrillation     Amiodarone therapy, status post Cox-Maze procedure but with recurrent atrial fibrillation while on amiodarone August 2012  . Carotid artery disease     Less than 50% bilateral internal carotid artery stenosis by Doppler 2009  .  Essential hypertension, benign   . Benign prostatic hyperplasia   . Carpal tunnel syndrome   . Warfarin anticoagulation     Current Outpatient Prescriptions  Medication Sig Dispense Refill  . ALOE VERA JUICE PO Take 2 oz by mouth 2 (two) times daily.    Marland Kitchen amiodarone (PACERONE) 200 MG tablet Take 1 tablet (200 mg total) by mouth daily. 30 tablet 6  . amLODipine (NORVASC) 10 MG tablet TAKE 1 TABLET BY MOUTH DAILY 30 tablet 6  . benazepril (LOTENSIN) 40 MG tablet TAKE 1 TABLET BY MOUTH DAILY 30 tablet 6  . carvedilol (COREG) 3.125 MG tablet TAKE 1 TABLET (3.125 MG TOTAL) BY MOUTH 2 (TWO) TIMES DAILY. 60 tablet 3  . furosemide (LASIX) 40 MG tablet Take 3 tablets (120 mg total) by mouth every morning. & 40 mg in the evening 360 tablet 3  . Iron-Folic Acid-Vit N82 (IRON FORMULA PO) Take 1 tablet by mouth daily. RAW IRON 22MG  - WHOLE FOOD IRON, NON-CONSTIPATING    . KLOR-CON M20 20 MEQ tablet TAKE 1 TABLET BY MOUTH DAILY 30 tablet 5  . Multiple Vitamins-Minerals (THEREMS M PO) Take 1 tablet by mouth daily.      . nitroGLYCERIN (NITROSTAT) 0.4 MG SL tablet Place 1 tablet (0.4 mg total) under the tongue every 5 (five) minutes as needed. Up to 3 doses, if no relief after 3rd dose, proceed to the ED for an evaluation. 25 tablet 3  . pravastatin (PRAVACHOL) 40 MG tablet TAKE 1 TABLET BY MOUTH  EVERY EVENING 30 tablet 6  . Probiotic Product (PROBIOTIC DAILY PO) Take 1 tablet by mouth daily.    . psyllium (METAMUCIL) 58.6 % packet Take 1 packet by mouth daily.      Marland Kitchen warfarin (COUMADIN) 3 MG tablet Take 3 mg by mouth as directed. Managed by Dr. Alfonse Spruce     No current facility-administered medications for this visit.    Allergies:  Review of patient's allergies indicates no known allergies.   Social History: The patient  reports that he quit smoking about 60 years ago. His smoking use included Cigarettes. He started smoking about 71 years ago. He has a 2.4 pack-year smoking history. He has never used  smokeless tobacco. He reports that he does not drink alcohol or use illicit drugs.   ROS:  Please see the history of present illness. Otherwise, complete review of systems is positive for none.  All other systems are reviewed and negative.   Physical Exam: VS:  BP 135/60 mmHg  Pulse 60  Ht 5\' 7"  (1.702 m)  Wt 192 lb 1.9 oz (87.145 kg)  BMI 30.08 kg/m2  SpO2 95%, BMI Body mass index is 30.08 kg/(m^2).  Wt Readings from Last 3 Encounters:  03/28/15 192 lb 1.9 oz (87.145 kg)  12/28/14 187 lb 1.9 oz (84.877 kg)  08/30/14 189 lb (85.73 kg)     Comfortable at rest.  HEENT: Conjunctiva and lids normal, oropharynx clear.  Neck: Supple, no elevated JVP, no thyromegaly.  Lungs: Clear to auscultation, nonlabored breathing at rest.  Cardiac: Regular rate and rhythm, no S3, 3/6 systolic murmur at base and apex, paradoxically split S2, no pericardial rub.  Abdomen: Soft, nontender, protuberent, bowel sounds present, no guarding or rebound.  Extremities: Bilateral compression hose in place with 2-3+ pitting edema, distal pulses 1-2+.  Skin: Warm and dry.  Musculoskeletal: No kyphosis.  Neuropsychiatric: Alert and oriented x3, affect grossly appropriate.   ECG: ECG is not ordered today.   Recent Labwork: 01/15/2015: BUN 28*; Creat 1.09; Potassium 4.0; Sodium 142   Other Studies Reviewed Today:  Echocardiogram 09/06/2014: Study Conclusions  - Left ventricle: The cavity size was normal. There was mild concentric hypertrophy. Systolic function was normal. The estimated ejection fraction was in the range of 60% to 65%. Doppler parameters are consistent with restrictive physiology, indicative of decreased left ventricular diastolic compliance and/or increased left atrial pressure. Doppler parameters are consistent with high ventricular filling pressure. - Regional wall motion abnormality: Mild hypokinesis of the basal inferoseptal myocardium. - Aortic valve:  Bioprosthetic aortic valve with mildly increased peak velocity and mean gradient. Peak velocity 3.41 m/s. There was no regurgitation. Mean gradient (S): 25 mm Hg. - Mitral valve: S/p mitral annuloplasty. Mild stenosis: mean gradient 6 mmHg. Mildly thickened leaflets . There was mild regurgitation. Valve area by pressure half-time: 1.63 cm^2. - Left atrium: The atrium was severely dilated. - Right ventricle: The cavity size was moderately dilated. Pacer wire or catheter noted in right ventricle. Systolic function was mildly reduced. - Right atrium: The atrium was severely dilated. Pacer wire or catheter noted in right atrium. - Tricuspid valve: There was moderate regurgitation. Mildly elevated pulmonary pressures: 40 mmHg. - Pulmonic valve: There was mild regurgitation. - Systemic veins: IVC is dilated with normal respiratory variation. Estimated CVP 8 mmHg.  Assessment and Plan:  1. Chronic leg edema in the setting of significant diastolic dysfunction and right ventricular dysfunction. Continue using compression hose. Lasix will be increased to 120 mg in the morning and  40 mg in the late afternoon. Follow-up BMET in 2 weeks. We may need to switch to Demadex.  2. Chronic diastolic heart failure, LVEF 60-65% with restrictive diastolic filling pattern.  3. Paroxysmal atrial fibrillation, continues on Coumadin.  4. CAD status post previous CABG, no active angina.  Current medicines were reviewed with the patient today.   Orders Placed This Encounter  Procedures  . Basic metabolic panel    Disposition: FU with me in 3 months.   Signed, Satira Sark, MD, [facility] 03/28/2015 11:50 AM    [facility] [place], [place], [place] [address] Phone: [phone]; Fax: [phone]

## 2015-03-29 ENCOUNTER — Encounter: Payer: Self-pay | Admitting: Cardiology

## 2015-04-01 ENCOUNTER — Other Ambulatory Visit: Payer: Self-pay | Admitting: Cardiology

## 2015-04-13 ENCOUNTER — Other Ambulatory Visit: Payer: Self-pay | Admitting: Cardiology

## 2015-04-19 ENCOUNTER — Telehealth: Payer: Self-pay | Admitting: *Deleted

## 2015-04-19 LAB — BASIC METABOLIC PANEL
BUN: 31 mg/dL — ABNORMAL HIGH (ref 7–25)
CO2: 27 mmol/L (ref 20–31)
Calcium: 8.8 mg/dL (ref 8.6–10.3)
Chloride: 103 mmol/L (ref 98–110)
Creat: 1.47 mg/dL — ABNORMAL HIGH (ref 0.70–1.11)
Glucose, Bld: 91 mg/dL (ref 65–99)
Potassium: 5.3 mmol/L (ref 3.5–5.3)
Sodium: 139 mmol/L (ref 135–146)

## 2015-04-19 NOTE — Telephone Encounter (Addendum)
Patient informed and stated that his edema had improved.

## 2015-04-19 NOTE — Telephone Encounter (Signed)
-----   Message from Satira Sark, MD sent at 04/19/2015  1:05 PM EDT ----- Reviewed. Please check to see how he is doing as far as his leg edema is concerned. We increased his Lasix at the last visit, creatinine has bumped up from 1.0-1.4, so I do not think that we can push it any further. We may need to consider switching to [facility].

## 2015-04-22 ENCOUNTER — Other Ambulatory Visit: Payer: Self-pay | Admitting: Cardiology

## 2015-04-23 ENCOUNTER — Other Ambulatory Visit: Payer: Self-pay | Admitting: Cardiology

## 2015-04-23 NOTE — Telephone Encounter (Signed)
Spoke with pharmacy staff at [facility] and was informed that they didn't receive a prescription for benazepril. New prescription sent.

## 2015-04-29 ENCOUNTER — Encounter: Payer: Medicare Other | Admitting: Internal Medicine

## 2015-05-03 ENCOUNTER — Other Ambulatory Visit: Payer: Self-pay | Admitting: Cardiology

## 2015-05-22 ENCOUNTER — Encounter: Payer: Self-pay | Admitting: Internal Medicine

## 2015-05-22 ENCOUNTER — Ambulatory Visit (INDEPENDENT_AMBULATORY_CARE_PROVIDER_SITE_OTHER): Payer: Medicare Other | Admitting: Internal Medicine

## 2015-05-22 VITALS — BP 110/68 | HR 60 | Ht 66.0 in | Wt 201.0 lb

## 2015-05-22 DIAGNOSIS — Z950 Presence of cardiac pacemaker: Secondary | ICD-10-CM

## 2015-05-22 DIAGNOSIS — I480 Paroxysmal atrial fibrillation: Secondary | ICD-10-CM | POA: Diagnosis not present

## 2015-05-22 DIAGNOSIS — I2510 Atherosclerotic heart disease of native coronary artery without angina pectoris: Secondary | ICD-10-CM | POA: Diagnosis not present

## 2015-05-22 DIAGNOSIS — I495 Sick sinus syndrome: Secondary | ICD-10-CM

## 2015-05-22 LAB — CUP PACEART INCLINIC DEVICE CHECK
Battery Impedance: 527 Ohm
Battery Remaining Longevity: 74 mo
Battery Voltage: 2.78 V
Brady Statistic AP VP Percent: 46 %
Brady Statistic AP VS Percent: 1 %
Brady Statistic AS VP Percent: 48 %
Brady Statistic AS VS Percent: 5 %
Date Time Interrogation Session: 20161026173214
Implantable Lead Implant Date: 20120815
Implantable Lead Implant Date: 20120815
Implantable Lead Location: 753859
Implantable Lead Location: 753860
Implantable Lead Model: 5076
Implantable Lead Model: 5076
Lead Channel Impedance Value: 402 Ohm
Lead Channel Impedance Value: 461 Ohm
Lead Channel Pacing Threshold Amplitude: 0.75 V
Lead Channel Pacing Threshold Amplitude: 0.75 V
Lead Channel Pacing Threshold Pulse Width: 0.4 ms
Lead Channel Pacing Threshold Pulse Width: 0.4 ms
Lead Channel Setting Pacing Amplitude: 2.5 V
Lead Channel Setting Pacing Amplitude: 2.5 V
Lead Channel Setting Pacing Pulse Width: 0.4 ms
Lead Channel Setting Sensing Sensitivity: 2.8 mV

## 2015-05-22 LAB — HEPATIC FUNCTION PANEL
ALT: 22 U/L (ref 9–46)
AST: 25 U/L (ref 10–35)
Albumin: 3.7 g/dL (ref 3.6–5.1)
Alkaline Phosphatase: 117 U/L — ABNORMAL HIGH (ref 40–115)
Bilirubin, Direct: 0.2 mg/dL (ref <=0.2)
Indirect Bilirubin: 0.4 mg/dL (ref 0.2–1.2)
Total Bilirubin: 0.6 mg/dL (ref 0.2–1.2)
Total Protein: 6.2 g/dL (ref 6.1–8.1)

## 2015-05-22 LAB — BASIC METABOLIC PANEL
BUN: 29 mg/dL — ABNORMAL HIGH (ref 7–25)
CO2: 31 mmol/L (ref 20–31)
Calcium: 9 mg/dL (ref 8.6–10.3)
Chloride: 102 mmol/L (ref 98–110)
Creat: 1.32 mg/dL — ABNORMAL HIGH (ref 0.70–1.11)
Glucose, Bld: 109 mg/dL — ABNORMAL HIGH (ref 65–99)
Potassium: 3.8 mmol/L (ref 3.5–5.3)
Sodium: 139 mmol/L (ref 135–146)

## 2015-05-22 MED ORDER — AMIODARONE HCL 200 MG PO TABS: 100.0000 mg | ORAL_TABLET | Freq: Every day | ORAL | Status: DC

## 2015-05-22 MED ORDER — AMLODIPINE BESYLATE 10 MG PO TABS: 5.0000 mg | ORAL_TABLET | Freq: Every day | ORAL | Status: DC

## 2015-05-22 NOTE — Patient Instructions (Signed)
Medication Instructions:  Your physician has recommended you make the following change in your medication:  1) Decrease Amiodarone to 100mg  daily 2) Decrease Norvasc to 5 mg daily    Labwork: Your physician recommends that you return for lab work today: BMP/liver/TSH   Testing/Procedures: None ordered   Follow-Up: Your physician wants you to follow-up in: 12 months with Dr Rayann Heman in [place] will receive a reminder letter in the mail two months in advance. If you don't receive a letter, please call our office to schedule the follow-up appointment.  Remote monitoring is used to monitor your Pacemaker  from home. This monitoring reduces the number of office visits required to check your device to one time per year. It allows [place] to keep an eye on the functioning of your device to ensure it is working properly. You are scheduled for a device check from home on 08/21/15. You may send your transmission at any time that day. If you have a wireless device, the transmission will be sent automatically. After your physician reviews your transmission, you will receive a postcard with your next transmission date.     Any Other Special Instructions Will Be Listed Below (If Applicable).     If you need a refill on your cardiac medications before your next appointment, please call your pharmacy.

## 2015-05-23 LAB — TSH: TSH: 10.14 u[IU]/mL — ABNORMAL HIGH (ref 0.350–4.500)

## 2015-05-27 ENCOUNTER — Telehealth: Payer: Self-pay | Admitting: Internal Medicine

## 2015-05-27 NOTE — Telephone Encounter (Signed)
Spoke wth wife and have faxed his labs over to Dr Eber Hong as Dr Alfonse Spruce his regular PCP is out on medical leave.  She will contact them if she does not hear from them

## 2015-05-27 NOTE — Telephone Encounter (Signed)
F/u  Pt returning phone call concerning lab work. Please call back and discuss.

## 2015-05-28 ENCOUNTER — Other Ambulatory Visit: Payer: Self-pay | Admitting: Cardiology

## 2015-05-28 NOTE — Progress Notes (Signed)
PCP:  [name], MD Primary Cardiologist:  [name]  The patient presents today for routine electrophysiology followup.  Since last being seen in our clinic, the patient reports doing reasonably well.  He is very unsteady and has had several falls.  He now walks with a walker.  Today, he denies symptoms of palpitations, chest pain, shortness of breath, orthopnea, PND, presyncope, syncope, or further neurologic sequela.  The patient feels that he is tolerating medications without difficulties and is otherwise without complaint today.   Past Medical History  Diagnosis Date  . Symptomatic bradycardia   . Atrial flutter (HCC)     Amiodarone therapy, coumadin therapy, prior history of cardioversion and overdrive pacing  . Cardiomyopathy     Mixed ischemic and nonischemic with previous EF of 35-40%, most recently in June 2009 normalized to 55-60%. Echo 2/11 EF 55-60%  . Coronary atherosclerosis of native coronary artery     Status post coronary bypass grafting  . Valvular heart disease     Status post by prosthetic aortic valve and mitral valve repair  . Endocarditis     s/p PPM extraction by Dr Lovena Le 2012 endocarditis with enterococcus faecalis  . Paroxysmal atrial fibrillation (HCC)     Amiodarone therapy, status post Cox-Maze procedure but with recurrent atrial fibrillation while on amiodarone August 2012  . Carotid artery disease (HCC)     Less than 50% bilateral internal carotid artery stenosis by Doppler 2009  . Essential hypertension, benign   . Benign prostatic hyperplasia   . Carpal tunnel syndrome   . Warfarin anticoagulation    Past Surgical History  Procedure Laterality Date  . Coronary artery bypass graft  2008    x2  . Total hip arthroplasty    . Knee arthroscopy    . Carpal tunnel release  2009  . Pacemaker insertion  2009    Initially implanted at [facility], extracted by Dr Lovena Le for endocarditis and reimplanted 8/12  . Aortic valve replacement    .  Tonsillectomy  1939  . Hernia repair  age 71  . Patella fracture surgery  age 59  . Cataract extraction, bilateral  07/2011    Current Outpatient Prescriptions  Medication Sig Dispense Refill  . ALOE VERA JUICE PO Take 2 oz by mouth 2 (two) times daily.    Marland Kitchen amiodarone (PACERONE) 200 MG tablet Take 0.5 tablets (100 mg total) by mouth daily. 30 tablet 6  . amLODipine (NORVASC) 10 MG tablet Take 0.5 tablets (5 mg total) by mouth daily. 30 tablet 6  . benazepril (LOTENSIN) 40 MG tablet TAKE 1 TABLET BY MOUTH DAILY 30 tablet 6  . carvedilol (COREG) 3.125 MG tablet TAKE 1 TABLET (3.125 MG TOTAL) BY MOUTH 2 (TWO) TIMES DAILY. 60 tablet 3  . FIBER PO Take 1 packet by mouth daily. [employer] Brand fiber therapy    . furosemide (LASIX) 40 MG tablet Take 3 tablets (120 mg total) by mouth every morning. & 40 mg in the evening 360 tablet 3  . Iron-Folic Acid-Vit V37 (IRON FORMULA PO) Take 1 tablet by mouth daily. RAW IRON 22MG  - WHOLE FOOD IRON, NON-CONSTIPATING    . KLOR-CON M20 20 MEQ tablet TAKE 1 TABLET BY MOUTH DAILY 30 tablet 5  . Multiple Vitamins-Minerals (THEREMS M PO) Take 1 tablet by mouth daily.      . nitroGLYCERIN (NITROSTAT) 0.4 MG SL tablet Place 1 tablet (0.4 mg total) under the tongue every 5 (five) minutes as needed. Up  to 3 doses, if no relief after 3rd dose, proceed to the ED for an evaluation. 25 tablet 3  . Probiotic Product (PROBIOTIC DAILY PO) Take 1 tablet by mouth daily.    Marland Kitchen warfarin (COUMADIN) 3 MG tablet Take 3 mg by mouth as directed. Managed by Dr. Alfonse Spruce    . KLOR-CON M20 20 MEQ tablet TAKE 1 TABLET BY MOUTH DAILY 30 tablet 6  . pravastatin (PRAVACHOL) 40 MG tablet TAKE 1 TABLET BY MOUTH EVERY EVENING 30 tablet 6   No current facility-administered medications for this visit.    No Known Allergies  Social History   Social History  . Marital Status: Married    Spouse Name: N/A  . Number of Children: N/A  . Years of Education: N/A   Occupational History  . Not  on file.   Social History Main Topics  . Smoking status: Former Smoker -- 0.30 packs/day for 8 years    Types: Cigarettes    Start date: 03/25/1944    Quit date: 07/27/1954  . Smokeless tobacco: Never Used  . Alcohol Use: No  . Drug Use: No  . Sexual Activity: Not on file   Other Topics Concern  . Not on file   Social History Narrative    Family History  Problem Relation Age of Onset  . Diabetes Neg Hx   . Hypertension Neg Hx   . Coronary artery disease Neg Hx   . Cancer Mother   . Other Father     Heart attack or stroke   ROS- all systems are reviewed and negative except as per HPI above  Physical Exam: Filed Vitals:   05/22/15 1436  BP: 110/68  Pulse: 60  Height: 5\' 6"  (1.676 m)  Weight: 201 lb (91.173 kg)    GEN- The patient is elderly appearing, alert and oriented x 3 today.   Head- normocephalic, atraumatic Eyes-  Sclera clear, conjunctiva pink Ears- hearing intact Oropharynx- clear Neck- supple  Lungs- Clear to ausculation bilaterally, normal work of breathing Chest- pacemaker pocket is well healed, old L sided pocket also well healed Heart- Regular rate and rhythm, 2/6 SEM LUSB (early peaking), 2/6 SEM at the apex GI- soft, NT, ND, + BS Extremities- no clubbing, cyanosis, trace edema  Pacemaker interrogation- reviewed in detail today,  See PACEART report  Assessment and Plan:  1. Sick sinus syndrome/ symptomatic bradycadria Normal pacemaker function See Pace Art report No changes today  2. afib Well conrolled Reduce amiodarone to 100mg  daily  3. Hypertensive cardiovascular disease Stable Reduce norvasc to 5mg  daily  Return to see me in [place] device clinic in 1 year [facility]  Thompson Grayer MD

## 2015-07-02 ENCOUNTER — Encounter: Payer: Self-pay | Admitting: Cardiology

## 2015-07-02 ENCOUNTER — Ambulatory Visit (INDEPENDENT_AMBULATORY_CARE_PROVIDER_SITE_OTHER): Payer: Medicare Other | Admitting: Cardiology

## 2015-07-02 VITALS — BP 112/68 | HR 60 | Ht 66.0 in | Wt 182.0 lb

## 2015-07-02 DIAGNOSIS — I10 Essential (primary) hypertension: Secondary | ICD-10-CM | POA: Diagnosis not present

## 2015-07-02 DIAGNOSIS — I38 Endocarditis, valve unspecified: Secondary | ICD-10-CM

## 2015-07-02 DIAGNOSIS — I480 Paroxysmal atrial fibrillation: Secondary | ICD-10-CM

## 2015-07-02 DIAGNOSIS — E038 Other specified hypothyroidism: Secondary | ICD-10-CM | POA: Diagnosis not present

## 2015-07-02 DIAGNOSIS — I5032 Chronic diastolic (congestive) heart failure: Secondary | ICD-10-CM | POA: Diagnosis not present

## 2015-07-02 DIAGNOSIS — I2510 Atherosclerotic heart disease of native coronary artery without angina pectoris: Secondary | ICD-10-CM | POA: Diagnosis not present

## 2015-07-02 MED ORDER — FUROSEMIDE 40 MG PO TABS: ORAL_TABLET | ORAL | Status: DC

## 2015-07-02 NOTE — Addendum Note (Signed)
Addended by: Laurine Blazer on: 07/02/2015 11:52 AM   Modules accepted: Medications

## 2015-07-02 NOTE — Patient Instructions (Signed)
   Decrease Lasix to 80mg  every morning & 40mg  every evening.  May take extra 40mg  in the morning if needed. Continue all other medications.   Labs for TSH, BMET - due just prior to next visit.  Orders given today. Call office with dose of thyroid medication.   Office will contact with results via phone or letter.   Follow up in  2 months

## 2015-07-02 NOTE — Progress Notes (Signed)
Cardiology Office Note  Date: 07/02/2015   ID: [name], DOB 1929/07/06, MRN [id number]  PCP: [name], MD  Primary Cardiologist: Rozann Lesches, MD   Chief Complaint  Patient presents with  . Cardiomyopathy  . Valvular heart disease   History of Present Illness: [name] is a medically complex 81 y.o. male last seen in September. He is here today with his wife for a follow-up visit. He is doing much better in terms of fluid status, weight is down nearly 20 pounds since I saw him in October.  At the previous visit we increased Lasix to 120 mg the morning and 40 mg in late afternoon. Most recent lab work is reviewed below showing creatinine 1.3 with potassium 3.8. Also noted that his TSH was elevated at 10.1. Amiodarone was recently reduced to 100 mg daily and Norvasc to 5 mg daily by Dr. Rayann Heman. He did have a follow-up with his PCP and was placed on Synthroid.  Leg edema is much better, he still uses compression stockings. Otherwise he reports no major change in stamina, breathlessness, or appetite. He is in a wheelchair today.  He reports no palpitations, continues on Coumadin with history of PAF that has been well controlled on amiodarone. Anticoagulation is followed by PCP. No spontaneous bleeding problems.  No active angina symptoms on medical therapy. He has not used any nitroglycerin. Echocardiogram from earlier this year is reviewed below.  Past Medical History  Diagnosis Date  . Symptomatic bradycardia   . Atrial flutter (HCC)     Amiodarone therapy, coumadin therapy, prior history of cardioversion and overdrive pacing  . Cardiomyopathy     Mixed ischemic and nonischemic with previous EF of 35-40%, most recently in June 2009 normalized to 55-60%. Echo 2/11 EF 55-60%  . Coronary atherosclerosis of native coronary artery     Status post coronary bypass grafting  . Valvular heart disease     Status post by prosthetic aortic valve and mitral valve  repair  . Endocarditis     s/p PPM extraction by Dr Lovena Le 2012 endocarditis with enterococcus faecalis  . Paroxysmal atrial fibrillation (HCC)     Amiodarone therapy, status post Cox-Maze procedure but with recurrent atrial fibrillation while on amiodarone August 2012  . Carotid artery disease (HCC)     Less than 50% bilateral internal carotid artery stenosis by Doppler 2009  . Essential hypertension, benign   . Benign prostatic hyperplasia   . Carpal tunnel syndrome   . Warfarin anticoagulation     Past Surgical History  Procedure Laterality Date  . Coronary artery bypass graft  2008    x2  . Total hip arthroplasty    . Knee arthroscopy    . Carpal tunnel release  2009  . Pacemaker insertion  2009    Initially implanted at [facility], extracted by Dr Lovena Le for endocarditis and reimplanted 8/12  . Aortic valve replacement    . Tonsillectomy  1939  . Hernia repair  age 26  . Patella fracture surgery  age 19  . Cataract extraction, bilateral  07/2011    Current Outpatient Prescriptions  Medication Sig Dispense Refill  . ALOE VERA JUICE PO Take 2 oz by mouth 2 (two) times daily.    Marland Kitchen amiodarone (PACERONE) 200 MG tablet Take 0.5 tablets (100 mg total) by mouth daily. 30 tablet 6  . amLODipine (NORVASC) 10 MG tablet Take 0.5 tablets (5 mg total) by mouth daily. 30 tablet 6  .  benazepril (LOTENSIN) 40 MG tablet TAKE 1 TABLET BY MOUTH DAILY 30 tablet 6  . carvedilol (COREG) 3.125 MG tablet TAKE 1 TABLET (3.125 MG TOTAL) BY MOUTH 2 (TWO) TIMES DAILY. 60 tablet 3  . FIBER PO Take 1 packet by mouth daily. Whole [employer] Brand fiber therapy    . furosemide (LASIX) 40 MG tablet Take 2 tabs (80mg ) by mouth every morning & 1 tab (40mg ) every evening, may take an extra 40mg  in the morning if needed.    . Iron-Folic Acid-Vit 123456 (IRON FORMULA PO) Take 1 tablet by mouth daily. RAW IRON 22MG  - WHOLE FOOD IRON, NON-CONSTIPATING    . KLOR-CON M20 20 MEQ tablet TAKE 1 TABLET BY MOUTH DAILY 30 tablet 5  .  Multiple Vitamins-Minerals (THEREMS M PO) Take 1 tablet by mouth daily.      . nitroGLYCERIN (NITROSTAT) 0.4 MG SL tablet Place 1 tablet (0.4 mg total) under the tongue every 5 (five) minutes as needed. Up to 3 doses, if no relief after 3rd dose, proceed to the ED for an evaluation. 25 tablet 3  . pravastatin (PRAVACHOL) 40 MG tablet TAKE 1 TABLET BY MOUTH EVERY EVENING 30 tablet 6  . Probiotic Product (PROBIOTIC DAILY PO) Take 1 tablet by mouth daily.    Marland Kitchen warfarin (COUMADIN) 3 MG tablet Take 3 mg by mouth as directed. Managed by Dr. Alfonse Spruce     No current facility-administered medications for this visit.    Allergies:  Review of patient's allergies indicates no known allergies.   Social History: The patient  reports that he quit smoking about 60 years ago. His smoking use included Cigarettes. He started smoking about 71 years ago. He has a 2.4 pack-year smoking history. He has never used smokeless tobacco. He reports that he does not drink alcohol or use illicit drugs.   ROS:  Please see the history of present illness. Otherwise, complete review of systems is positive for chronic dyspnea and NYHA class 2-3. No fevers or chills. All other systems are reviewed and negative.   Physical Exam: VS:  BP 112/68 mmHg  Pulse 60  Ht 5\' 6"  (1.676 m)  Wt 182 lb (82.555 kg)  BMI 29.39 kg/m2  SpO2 95%, BMI Body mass index is 29.39 kg/(m^2).  Wt Readings from Last 3 Encounters:  07/02/15 182 lb (82.555 kg)  05/22/15 201 lb (91.173 kg)  03/28/15 192 lb 1.9 oz (87.145 kg)    Appears comfortable at rest. Seated in wheelchair HEENT: Conjunctiva and lids normal, oropharynx clear.  Neck: Supple, no elevated JVP, no thyromegaly.  Lungs: Clear to auscultation, nonlabored breathing at rest.  Cardiac: Regular rate and rhythm, no S3, 3/6 systolic murmur at base and apex, paradoxically split S2, no pericardial rub.  Abdomen: Soft, nontender, protuberent, bowel sounds present, no guarding or rebound.   Extremities: Bilateral compression hose in place with 1-2+ pitting edema, distal pulses 1-2+.  Skin: Warm and dry.  Musculoskeletal: No kyphosis.  Neuropsychiatric: Alert and oriented x3, affect grossly appropriate.  ECG: ECG is not ordered today.  Recent Labwork: 05/22/2015: ALT 22; AST 25; BUN 29*; Creat 1.32*; Potassium 3.8; Sodium 139; TSH 10.140*   Other Studies Reviewed Today:  Echocardiogram 09/06/2014: Study Conclusions  - Left ventricle: The cavity size was normal. There was mild concentric hypertrophy. Systolic function was normal. The estimated ejection fraction was in the range of 60% to 65%. Doppler parameters are consistent with restrictive physiology, indicative of decreased left ventricular diastolic compliance and/or increased left atrial pressure.  Doppler parameters are consistent with high ventricular filling pressure. - Regional wall motion abnormality: Mild hypokinesis of the basal inferoseptal myocardium. - Aortic valve: Bioprosthetic aortic valve with mildly increased peak velocity and mean gradient. Peak velocity 3.41 m/s. There was no regurgitation. Mean gradient (S): 25 mm Hg. - Mitral valve: S/p mitral annuloplasty. Mild stenosis: mean gradient 6 mmHg. Mildly thickened leaflets . There was mild regurgitation. Valve area by pressure half-time: 1.63 cm^2. - Left atrium: The atrium was severely dilated. - Right ventricle: The cavity size was moderately dilated. Pacer wire or catheter noted in right ventricle. Systolic function was mildly reduced. - Right atrium: The atrium was severely dilated. Pacer wire or catheter noted in right atrium. - Tricuspid valve: There was moderate regurgitation. Mildly elevated pulmonary pressures: 40 mmHg. - Pulmonic valve: There was mild regurgitation. - Systemic veins: IVC is dilated with normal respiratory variation. Estimated CVP 8 mmHg.  Assessment and Plan:  1. Significantly  improved chronic leg edema in the setting of diastolic dysfunction and RV dysfunction. He continues to use compression stockings. Agree with recent reduction and Norvasc which could also be helping with his edema to some degree. We will reduce Lasix to 80 mg in the morning and 40 mg in the afternoon, he can take an additional 40 mg in the morning if leg edema worsens her weight increases. Follow-up BMET for next visit.  2. Paroxysmal atrial fibrillation, well controlled on amiodarone, now reduced to 100 mg daily. He continues on Coumadin, followed by PCP. No bleeding problems.  3. Elevated TSH, 10-12 range based on recent lab work and follow-up with PCP. He has been placed on levothyroxin. Amiodarone also reduced to 100 mg daily.  4. CAD status post CABG, no active angina symptoms or nitroglycerin requirement. Can you observation.  5. Valvular heart disease status post bioprostatic aortic valve replacement and mitral valve repair. Echocardiogram from earlier this year showed stable AVR and mildly stenotic mitral valve. Continue observation.  Current medicines were reviewed with the patient today.   Orders Placed This Encounter  Procedures  . TSH  . Basic metabolic panel    Disposition: FU with me in 2 months.   Signed, Satira Sark, MD, [facility] 07/02/2015 10:53 AM    [place] [facility] [place], [place], [place] [address] Phone: [phone]; Fax: [phone]

## 2015-07-09 ENCOUNTER — Telehealth: Payer: Self-pay | Admitting: *Deleted

## 2015-07-09 ENCOUNTER — Encounter: Payer: Self-pay | Admitting: *Deleted

## 2015-07-09 NOTE — Telephone Encounter (Signed)
Per wife patient is weak and doesn't have much strength, no appetite, unsteady gain and increased thirstiness. Wife said that patient is having a hard time with talking and catching his breath. Per wife patient has increased sob and weight gain. Per wife, patient's weight was 176 lbs after shower last night and his base weight is 176-178 lbs. Patient did not weigh today but on yesterday morning, patient's weight was 175 lbs. Wife advised that this isn't representing a weight gain. No c/o dizziness or chest pain. Patient has swelling always in his extremities all the time but no increased extremity swelling. Wife reports more swelling in abdomen area. BP-120/50 & HR-60 & Temp-38.8 degrees celsius and showed H at the bottom meaning HIGH (101.84 in F). Wife says that patient has same symptoms he's had in the past with CHF. No congestion or coughing. Medication reviewed while on phone with wife. Wife advised that patient needed to be evaluated by his PCP for these symptoms and if his PCP felt it was a cardiac problem, to have them contact us for an appointment. Wife said she really didn't want to take him to [facility] because his regular PCP is out on FMLA. Nurse advised patient that she could take him to urgent care. Wife advised that if its a cardiac problem that we could get him an appointment this week.

## 2015-07-09 NOTE — Telephone Encounter (Signed)
This encounter was created in error - please disregard.

## 2015-08-01 ENCOUNTER — Inpatient Hospital Stay (HOSPITAL_COMMUNITY)
Admission: EM | Admit: 2015-08-01 | Discharge: 2015-08-04 | DRG: 811 | Disposition: A | Discharge status: Home / Self Care | Payer: Medicare Other | Attending: Internal Medicine | Admitting: Internal Medicine

## 2015-08-01 ENCOUNTER — Telehealth: Payer: Self-pay | Admitting: Cardiology

## 2015-08-01 ENCOUNTER — Encounter (HOSPITAL_COMMUNITY): Payer: Self-pay | Admitting: *Deleted

## 2015-08-01 DIAGNOSIS — I5032 Chronic diastolic (congestive) heart failure: Secondary | ICD-10-CM | POA: Diagnosis present

## 2015-08-01 DIAGNOSIS — D509 Iron deficiency anemia, unspecified: Secondary | ICD-10-CM | POA: Diagnosis present

## 2015-08-01 DIAGNOSIS — I480 Paroxysmal atrial fibrillation: Secondary | ICD-10-CM | POA: Diagnosis present

## 2015-08-01 DIAGNOSIS — I5033 Acute on chronic diastolic (congestive) heart failure: Secondary | ICD-10-CM | POA: Diagnosis present

## 2015-08-01 DIAGNOSIS — Z951 Presence of aortocoronary bypass graft: Secondary | ICD-10-CM | POA: Diagnosis not present

## 2015-08-01 DIAGNOSIS — I129 Hypertensive chronic kidney disease with stage 1 through stage 4 chronic kidney disease, or unspecified chronic kidney disease: Secondary | ICD-10-CM | POA: Diagnosis present

## 2015-08-01 DIAGNOSIS — Z953 Presence of xenogenic heart valve: Secondary | ICD-10-CM | POA: Diagnosis not present

## 2015-08-01 DIAGNOSIS — I2510 Atherosclerotic heart disease of native coronary artery without angina pectoris: Secondary | ICD-10-CM | POA: Diagnosis present

## 2015-08-01 DIAGNOSIS — Z66 Do not resuscitate: Secondary | ICD-10-CM | POA: Diagnosis present

## 2015-08-01 DIAGNOSIS — D649 Anemia, unspecified: Secondary | ICD-10-CM | POA: Diagnosis present

## 2015-08-01 DIAGNOSIS — Z9989 Dependence on other enabling machines and devices: Secondary | ICD-10-CM | POA: Diagnosis present

## 2015-08-01 DIAGNOSIS — Z87891 Personal history of nicotine dependence: Secondary | ICD-10-CM | POA: Diagnosis not present

## 2015-08-01 DIAGNOSIS — N183 Chronic kidney disease, stage 3 (moderate): Secondary | ICD-10-CM | POA: Diagnosis present

## 2015-08-01 DIAGNOSIS — G4733 Obstructive sleep apnea (adult) (pediatric): Secondary | ICD-10-CM | POA: Diagnosis present

## 2015-08-01 DIAGNOSIS — E8770 Fluid overload, unspecified: Secondary | ICD-10-CM

## 2015-08-01 DIAGNOSIS — Z7901 Long term (current) use of anticoagulants: Secondary | ICD-10-CM | POA: Diagnosis not present

## 2015-08-01 DIAGNOSIS — Z950 Presence of cardiac pacemaker: Secondary | ICD-10-CM | POA: Diagnosis not present

## 2015-08-01 DIAGNOSIS — N289 Disorder of kidney and ureter, unspecified: Secondary | ICD-10-CM

## 2015-08-01 HISTORY — DX: Unspecified osteoarthritis, unspecified site: M19.90

## 2015-08-01 LAB — COMPREHENSIVE METABOLIC PANEL
ALT: 26 U/L (ref 17–63)
AST: 32 U/L (ref 15–41)
Albumin: 3.2 g/dL — ABNORMAL LOW (ref 3.5–5.0)
Alkaline Phosphatase: 112 U/L (ref 38–126)
Anion gap: 9 (ref 5–15)
BUN: 23 mg/dL — ABNORMAL HIGH (ref 6–20)
CO2: 28 mmol/L (ref 22–32)
Calcium: 9.2 mg/dL (ref 8.9–10.3)
Chloride: 103 mmol/L (ref 101–111)
Creatinine, Ser: 1.42 mg/dL — ABNORMAL HIGH (ref 0.61–1.24)
GFR calc Af Amer: 51 mL/min — ABNORMAL LOW (ref >60)
GFR calc non Af Amer: 44 mL/min — ABNORMAL LOW (ref >60)
Glucose, Bld: 94 mg/dL (ref 65–99)
Potassium: 3.6 mmol/L (ref 3.5–5.1)
Sodium: 140 mmol/L (ref 135–145)
Total Bilirubin: 1 mg/dL (ref 0.3–1.2)
Total Protein: 6.8 g/dL (ref 6.5–8.1)

## 2015-08-01 LAB — RETICULOCYTES
RBC.: 3.56 MIL/uL — ABNORMAL LOW (ref 4.22–5.81)
Retic Count, Absolute: 89 10*3/uL (ref 19.0–186.0)
Retic Ct Pct: 2.5 % (ref 0.4–3.1)

## 2015-08-01 LAB — CBC
HCT: 25.9 % — ABNORMAL LOW (ref 39.0–52.0)
Hemoglobin: 7.8 g/dL — ABNORMAL LOW (ref 13.0–17.0)
MCH: 21 pg — ABNORMAL LOW (ref 26.0–34.0)
MCHC: 30.1 g/dL (ref 30.0–36.0)
MCV: 69.6 fL — ABNORMAL LOW (ref 78.0–100.0)
Platelets: 166 10*3/uL (ref 150–400)
RBC: 3.72 MIL/uL — ABNORMAL LOW (ref 4.22–5.81)
RDW: 21.4 % — ABNORMAL HIGH (ref 11.5–15.5)
WBC: 4.8 10*3/uL (ref 4.0–10.5)

## 2015-08-01 LAB — POC OCCULT BLOOD, ED: Fecal Occult Bld: NEGATIVE

## 2015-08-01 LAB — PREPARE RBC (CROSSMATCH)

## 2015-08-01 LAB — PROTIME-INR
INR: 1.96 — ABNORMAL HIGH (ref 0.00–1.49)
Prothrombin Time: 22.2 seconds — ABNORMAL HIGH (ref 11.6–15.2)

## 2015-08-01 MED ORDER — ONDANSETRON HCL 4 MG/2ML IJ SOLN: 4.0000 mg | Freq: Four times a day (QID) | INTRAMUSCULAR | Status: DC | PRN

## 2015-08-01 MED ORDER — ACETAMINOPHEN 650 MG RE SUPP: 650.0000 mg | Freq: Four times a day (QID) | RECTAL | Status: DC | PRN

## 2015-08-01 MED ORDER — CARVEDILOL 3.125 MG PO TABS
3.1250 mg | ORAL_TABLET | Freq: Two times a day (BID) | ORAL | Status: DC
  Administered 2015-08-02 – 2015-08-04 (×5): 3.125 mg via ORAL
  Filled 2015-08-01 (×5): qty 1

## 2015-08-01 MED ORDER — POTASSIUM CHLORIDE CRYS ER 20 MEQ PO TBCR
20.0000 meq | EXTENDED_RELEASE_TABLET | Freq: Every day | ORAL | Status: DC
  Administered 2015-08-02: 20 meq via ORAL
  Filled 2015-08-01: qty 1

## 2015-08-01 MED ORDER — ONDANSETRON HCL 4 MG PO TABS: 4.0000 mg | ORAL_TABLET | Freq: Four times a day (QID) | ORAL | Status: DC | PRN

## 2015-08-01 MED ORDER — BENAZEPRIL HCL 40 MG PO TABS
40.0000 mg | ORAL_TABLET | Freq: Every day | ORAL | Status: DC
Start: 2015-08-02 — End: 2015-08-04
  Administered 2015-08-02 – 2015-08-04 (×3): 40 mg via ORAL
  Filled 2015-08-01 (×3): qty 1

## 2015-08-01 MED ORDER — ACETAMINOPHEN 325 MG PO TABS: 650.0000 mg | ORAL_TABLET | Freq: Four times a day (QID) | ORAL | Status: DC | PRN

## 2015-08-01 MED ORDER — NITROGLYCERIN 0.4 MG SL SUBL: 0.4000 mg | SUBLINGUAL_TABLET | SUBLINGUAL | Status: DC | PRN

## 2015-08-01 MED ORDER — SODIUM CHLORIDE 0.9 % IV SOLN
10.0000 mL/h | Freq: Once | INTRAVENOUS | Status: DC
Start: 2015-08-01 — End: 2015-08-01

## 2015-08-01 MED ORDER — FUROSEMIDE 40 MG PO TABS
40.0000 mg | ORAL_TABLET | Freq: Every day | ORAL | Status: DC
  Administered 2015-08-02: 40 mg via ORAL
  Filled 2015-08-01: qty 1

## 2015-08-01 MED ORDER — SODIUM CHLORIDE 0.9 % IJ SOLN
3.0000 mL | Freq: Two times a day (BID) | INTRAMUSCULAR | Status: DC
Start: 2015-08-02 — End: 2015-08-04
  Administered 2015-08-02 – 2015-08-04 (×6): 3 mL via INTRAVENOUS

## 2015-08-01 MED ORDER — LEVOTHYROXINE SODIUM 25 MCG PO TABS
25.0000 ug | ORAL_TABLET | Freq: Every day | ORAL | Status: DC
  Administered 2015-08-02 – 2015-08-04 (×3): 25 ug via ORAL
  Filled 2015-08-01 (×3): qty 1

## 2015-08-01 MED ORDER — AMLODIPINE BESYLATE 5 MG PO TABS
5.0000 mg | ORAL_TABLET | Freq: Every day | ORAL | Status: DC
  Administered 2015-08-02 – 2015-08-04 (×3): 5 mg via ORAL
  Filled 2015-08-01 (×3): qty 1

## 2015-08-01 MED ORDER — AMIODARONE HCL 200 MG PO TABS
100.0000 mg | ORAL_TABLET | Freq: Every day | ORAL | Status: DC
Start: 2015-08-02 — End: 2015-08-04
  Administered 2015-08-02 – 2015-08-04 (×3): 100 mg via ORAL
  Filled 2015-08-01 (×3): qty 1

## 2015-08-01 MED ORDER — PRAVASTATIN SODIUM 40 MG PO TABS
40.0000 mg | ORAL_TABLET | Freq: Every evening | ORAL | Status: DC
  Administered 2015-08-02 – 2015-08-03 (×2): 40 mg via ORAL
  Filled 2015-08-01 (×2): qty 1

## 2015-08-01 MED ORDER — FUROSEMIDE 80 MG PO TABS
80.0000 mg | ORAL_TABLET | Freq: Every day | ORAL | Status: DC
  Administered 2015-08-02 – 2015-08-04 (×3): 80 mg via ORAL
  Filled 2015-08-01 (×3): qty 1

## 2015-08-01 NOTE — Telephone Encounter (Signed)
Patient had blood work done and his wife is wanting to speak with [name] in reference to what needs to be done now about his blood?

## 2015-08-01 NOTE — Procedures (Signed)
Pt placed on automode CPAP.  Pt is comfortable and resting well.

## 2015-08-01 NOTE — H&P (Signed)
[facility] History and Physical  GOR THRAPP [id number] DOB: [birth year] DOA: 08/01/2015  Referring physician: Dr. Christy Gentles. PCP: [name], MD  Specialists: Dr. Rozann Lesches. Cardiologist.  Chief Complaint: Low hemoglobin.  HPI: [name] is a 82 y.o. male with history of CAD status post CABG, paroxysmal atrial fibrillation, diastolic CHF, chronic kidney disease, OSA, pacemaker placement was referred to the ER after patient's routine blood work showed low hemoglobin. Patient had followed up with his PCP for routine physical when blood work showed hemoglobin around 7. Patient's hemoglobin is usually around 9-10. Over the last few weeks patient has been having exertional shortness of breath. And over the last few months has noticed black stools off and on. Denies using any NSAIDs. Patient is on Coumadin for A. fib. Chest x-ray is unremarkable. Patient has been admitted for symptomatic anemia. On exam patient has significant lower extremity edema. Has never had any endoscopy before.   Review of Systems: As presented in the history of presenting illness, rest negative.  Past Medical History  Diagnosis Date  . Symptomatic bradycardia   . Atrial flutter (HCC)     Amiodarone therapy, coumadin therapy, prior history of cardioversion and overdrive pacing  . Cardiomyopathy     Mixed ischemic and nonischemic with previous EF of 35-40%, most recently in June 2009 normalized to 55-60%. Echo 2/11 EF 55-60%  . Coronary atherosclerosis of native coronary artery     Status post coronary bypass grafting  . Valvular heart disease     Status post by prosthetic aortic valve and mitral valve repair  . Endocarditis     s/p PPM extraction by Dr Lovena Le 2012 endocarditis with enterococcus faecalis  . Paroxysmal atrial fibrillation (HCC)     Amiodarone therapy, status post Cox-Maze procedure but with recurrent atrial fibrillation while on amiodarone August 2012  . Carotid artery  disease (HCC)     Less than 50% bilateral internal carotid artery stenosis by Doppler 2009  . Essential hypertension, benign   . Benign prostatic hyperplasia   . Carpal tunnel syndrome   . Warfarin anticoagulation   . Arthritis    Past Surgical History  Procedure Laterality Date  . Coronary artery bypass graft  2008    x2  . Total hip arthroplasty    . Knee arthroscopy    . Carpal tunnel release  2009  . Pacemaker insertion  2009    Initially implanted at [facility], extracted by Dr Lovena Le for endocarditis and reimplanted 8/12  . Aortic valve replacement    . Tonsillectomy  1939  . Hernia repair  age 12  . Patella fracture surgery  age 38  . Cataract extraction, bilateral  07/2011   Social History:  reports that he quit smoking about 61 years ago. His smoking use included Cigarettes. He started smoking about 71 years ago. He has a 2.4 pack-year smoking history. He has never used smokeless tobacco. He reports that he does not drink alcohol or use illicit drugs. Where does patient live home. Can patient participate in ADLs? Yes.  No Known Allergies  Family History:  Family History  Problem Relation Age of Onset  . Diabetes Neg Hx   . Hypertension Neg Hx   . Coronary artery disease Neg Hx   . Cancer Mother   . Other Father     Heart attack or stroke      Prior to Admission medications   Medication Sig Start Date End Date Taking? Authorizing Provider  ALOE  VERA JUICE PO Take 2 oz by mouth 2 (two) times daily.   Yes Historical Provider, MD  amiodarone (PACERONE) 200 MG tablet Take 0.5 tablets (100 mg total) by mouth daily. 05/22/15  Yes Thompson Grayer, MD  amLODipine (NORVASC) 10 MG tablet Take 0.5 tablets (5 mg total) by mouth daily. 05/22/15  Yes Thompson Grayer, MD  benazepril (LOTENSIN) 40 MG tablet TAKE 1 TABLET BY MOUTH DAILY 02/19/14  Yes Satira Sark, MD  carvedilol (COREG) 3.125 MG tablet TAKE 1 TABLET (3.125 MG TOTAL) BY MOUTH 2 (TWO) TIMES DAILY. 04/02/15  Yes Satira Sark, MD  FIBER PO Take 1 packet by mouth daily. [employer] Brand fiber therapy   Yes Historical Provider, MD  furosemide (LASIX) 40 MG tablet Take 2 tabs (80mg ) by mouth every morning & 1 tab (40mg ) every evening, may take an extra 40mg  in the morning if needed. 07/02/15  Yes Satira Sark, MD  Iron-Folic Acid-Vit 123456 (IRON FORMULA PO) Take 1 tablet by mouth daily. RAW IRON 22MG  - WHOLE FOOD IRON, NON-CONSTIPATING   Yes Historical Provider, MD  KLOR-CON M20 20 MEQ tablet TAKE 1 TABLET BY MOUTH DAILY 09/17/14  Yes Satira Sark, MD  levothyroxine (SYNTHROID, LEVOTHROID) 25 MCG tablet Take 25 mcg by mouth daily.   Yes Historical Provider, MD  Multiple Vitamins-Minerals (THEREMS M PO) Take 1 tablet by mouth daily.     Yes Historical Provider, MD  nitroGLYCERIN (NITROSTAT) 0.4 MG SL tablet Place 1 tablet (0.4 mg total) under the tongue every 5 (five) minutes as needed. Up to 3 doses, if no relief after 3rd dose, proceed to the ED for an evaluation. 12/28/13  Yes Satira Sark, MD  pravastatin (PRAVACHOL) 40 MG tablet TAKE 1 TABLET BY MOUTH EVERY EVENING 05/28/15  Yes Thompson Grayer, MD  Probiotic Product (PROBIOTIC DAILY PO) Take 1 tablet by mouth daily.   Yes Historical Provider, MD  warfarin (COUMADIN) 2 MG tablet Take 1-2 mg by mouth daily. Take 2mg  every day except on mondays take 1mg    Yes Historical Provider, MD    Physical Exam: Filed Vitals:   08/01/15 1607 08/01/15 1945 08/01/15 2000 08/01/15 2129  BP: 128/70 134/72 116/64 150/61  Pulse: 59 60 81 60  Temp: 97.8 F (36.6 C)   98.5 F (36.9 C)  TempSrc: Oral     Resp: 18   18  Height: 5\' 5"  (1.651 m)     Weight: 83.462 kg (184 lb)     SpO2: 96% 92% 90% 92%     General:  Moderately built and nourished.  Eyes: Anicteric no pallor.  ENT: No discharge from the ears eyes nose or mouth.  Neck: No mass felt. No JVD appreciated.  Cardiovascular: S1 and S2 heard.  Respiratory: No rhonchi or crepitations.  Abdomen: Soft  nontender bowel sounds present.  Skin: No rash.  Musculoskeletal: Bilateral lower extremity edema.  Psychiatric: Appears normal.  Neurologic: Alert awake oriented to time place and person. Moves all extremities.  Labs on Admission:  Basic Metabolic Panel:  Recent Labs Lab 08/01/15 1626  NA 140  K 3.6  CL 103  CO2 28  GLUCOSE 94  BUN 23*  CREATININE 1.42*  CALCIUM 9.2   Liver Function Tests:  Recent Labs Lab 08/01/15 1626  AST 32  ALT 26  ALKPHOS 112  BILITOT 1.0  PROT 6.8  ALBUMIN 3.2*   No results for input(s): LIPASE, AMYLASE in the last 168 hours. No results for input(s): AMMONIA  in the last 168 hours. CBC:  Recent Labs Lab 08/01/15 1626  WBC 4.8  HGB 7.8*  HCT 25.9*  MCV 69.6*  PLT 166   Cardiac Enzymes: No results for input(s): CKTOTAL, CKMB, CKMBINDEX, TROPONINI in the last 168 hours.  BNP (last 3 results) No results for input(s): BNP in the last 8760 hours.  ProBNP (last 3 results) No results for input(s): PROBNP in the last 8760 hours.  CBG: No results for input(s): GLUCAP in the last 168 hours.  Radiological Exams on Admission: No results found.   Assessment/Plan Principal Problem:   Symptomatic anemia Active Problems:   Obstructive sleep apnea   Paroxysmal atrial fibrillation (HCC)   Chronic diastolic CHF (congestive heart failure) ([place])   1. Symptomatic anemia - patient's blood indices show microcytic picture. Patient probably is having acute on chronic anemia. There is no clear-cut evidence of any bleed as stool for blood is negative. Will check LDH to make sure there is no hemolytic process. At this time patient will be receiving 1 unit of packed red blood cell transfusion with Lasix. Patient probably will need GI workup which probably can be done as outpatient if patient is stable. 2. Chronic diastolic CHF last EF measured in February 2016 was 60-65% - patient appears fluid overloaded. I have ordered a stat dose of Lasix after  transfusion and also one dose of Lasix now. Closely follow intake output metabolic panel and daily weights. 3. Paroxysmal atrial fibrillation with controlled on amiodarone. Patient is also on Coumadin with chads 2 vasc score more than 2. If GI workup is planned is inpatient the patient will need to be on heparin and Coumadin will need to be on hold. 4. CAD status post CABG denies any chest pain. 5. History of aortic valve replacement bioprosthetic. With mitral valve repair. 6. Chronic kidney disease stage III creatinine appears to be at baseline. 7. OSA on Cpap.   DVT ProphylaxisCoumadin.  Code Status: DO NOT RESUSCITATE.  Family Communication: Discussed with patient.  Disposition Plan: Admit for observation.    Rachell Druckenmiller N. [facility] Pager [phone].  If 7PM-7AM, please contact night-coverage [email or web address] Password [email or web address] 08/01/2015, 11:45 PM

## 2015-08-01 NOTE — ED Notes (Signed)
Pt went to PCP's for routine check-up and his hgb was 7.7.  PT is taking coumadin and he has been feeling increasingly weak.

## 2015-08-01 NOTE — Telephone Encounter (Signed)
Requested lab work is available for view in care everywhere.

## 2015-08-01 NOTE — Telephone Encounter (Signed)
Wife called to discuss which facility patient should use for a blood transfusion that was recommended by his PCP. Wife advised that the facility that they choose would be their decision and that the appropriate department within our facilities would be happy to manage his care is they choose to do so. Wife advised that a transfusion would not be managed by our cardiologists but it could be managed by the department within the system that handles transfusions. Wife advised to contact her PCP and have them arrange the transfusion at their place of choice. Wife said that she preferred to use [facility] for the care of her husband. Wife verbalized understanding of plan.

## 2015-08-01 NOTE — Progress Notes (Signed)
ANTICOAGULATION CONSULT NOTE - Initial Consult  Pharmacy Consult for Warfarin  Indication: atrial fibrillation  No Known Allergies  Patient Measurements: Height: 5\' 5"  (165.1 cm) Weight: 184 lb (83.462 kg) IBW/kg (Calculated) : 61.5  Vital Signs: Temp: 98.5 F (36.9 C) (01/05 2129) Temp Source: Oral (01/05 1607) BP: 150/61 mmHg (01/05 2129) Pulse Rate: 60 (01/05 2129)  Labs:  Recent Labs  08/01/15 1626  HGB 7.8*  HCT 25.9*  PLT 166  LABPROT 22.2*  INR 1.96*  CREATININE 1.42*   Estimated Creatinine Clearance: 38.5 mL/min (by C-G formula based on Cr of 1.42).  Medical History: Past Medical History  Diagnosis Date  . Symptomatic bradycardia   . Atrial flutter (HCC)     Amiodarone therapy, coumadin therapy, prior history of cardioversion and overdrive pacing  . Cardiomyopathy     Mixed ischemic and nonischemic with previous EF of 35-40%, most recently in June 2009 normalized to 55-60%. Echo 2/11 EF 55-60%  . Coronary atherosclerosis of native coronary artery     Status post coronary bypass grafting  . Valvular heart disease     Status post by prosthetic aortic valve and mitral valve repair  . Endocarditis     s/p PPM extraction by Dr Lovena Le 2012 endocarditis with enterococcus faecalis  . Paroxysmal atrial fibrillation (HCC)     Amiodarone therapy, status post Cox-Maze procedure but with recurrent atrial fibrillation while on amiodarone August 2012  . Carotid artery disease (HCC)     Less than 50% bilateral internal carotid artery stenosis by Doppler 2009  . Essential hypertension, benign   . Benign prostatic hyperplasia   . Carpal tunnel syndrome   . Warfarin anticoagulation   . Arthritis     Assessment: 82 y/o M sent from PCP with low Hgb, Hgb is 7.8 on admit (baseline 9-10 per MD note). Pt is on warfarin PTA for afib and INR on admit is 1.96, last dose of warfarin was 1/4. MD to continue warfarin for now given no clear cut evidence of bleed.   Goal of  Therapy:  INR 2-3 Monitor platelets by anticoagulation protocol: Yes   Plan:  -Warfarin 2 mg PO x 1 now -Daily PT/INR -Monitor for bleeding -F/U low Hgb work-up  Robet, Martenson 08/01/2015,11:53 PM

## 2015-08-01 NOTE — ED Provider Notes (Signed)
CSN: [id number]     Arrival date & time 08/01/15  1543 History   First MD Initiated Contact with Patient 08/01/15 1911     Chief Complaint  Patient presents with  . Anemia    hgb 7.7     Patient is a 82 y.o. male presenting with anemia. The history is provided by the patient.  Anemia This is a new problem. Episode onset: unknown. The problem occurs constantly. The problem has been gradually worsening. Associated symptoms include shortness of breath. Pertinent negatives include no chest pain and no abdominal pain. The symptoms are aggravated by walking. The symptoms are relieved by rest.  patient reports for past month he has felt weak/fatigue and short of breath, worse with walking No CP No new cough No fever No vomiting/diarrhea Reports he did notice dark stool recently He is on coumadin Seen by PCP today in [place] for routine checkup and was noted to be newly anemic   He also reports "Feeling cold" lately and was told he had hypothyroid and started meds for this Past Medical History  Diagnosis Date  . Symptomatic bradycardia   . Atrial flutter (HCC)     Amiodarone therapy, coumadin therapy, prior history of cardioversion and overdrive pacing  . Cardiomyopathy     Mixed ischemic and nonischemic with previous EF of 35-40%, most recently in June 2009 normalized to 55-60%. Echo 2/11 EF 55-60%  . Coronary atherosclerosis of native coronary artery     Status post coronary bypass grafting  . Valvular heart disease     Status post by prosthetic aortic valve and mitral valve repair  . Endocarditis     s/p PPM extraction by Dr Lovena Le 2012 endocarditis with enterococcus faecalis  . Paroxysmal atrial fibrillation (HCC)     Amiodarone therapy, status post Cox-Maze procedure but with recurrent atrial fibrillation while on amiodarone August 2012  . Carotid artery disease (HCC)     Less than 50% bilateral internal carotid artery stenosis by Doppler 2009  . Essential hypertension, benign    . Benign prostatic hyperplasia   . Carpal tunnel syndrome   . Warfarin anticoagulation   . Arthritis    Past Surgical History  Procedure Laterality Date  . Coronary artery bypass graft  2008    x2  . Total hip arthroplasty    . Knee arthroscopy    . Carpal tunnel release  2009  . Pacemaker insertion  2009    Initially implanted at [facility], extracted by Dr Lovena Le for endocarditis and reimplanted 8/12  . Aortic valve replacement    . Tonsillectomy  1939  . Hernia repair  age 79  . Patella fracture surgery  age 70  . Cataract extraction, bilateral  07/2011   Family History  Problem Relation Age of Onset  . Diabetes Neg Hx   . Hypertension Neg Hx   . Coronary artery disease Neg Hx   . Cancer Mother   . Other Father     Heart attack or stroke   Social History  Substance Use Topics  . Smoking status: Former Smoker -- 0.30 packs/day for 8 years    Types: Cigarettes    Start date: 03/25/1944    Quit date: 07/27/1954  . Smokeless tobacco: Never Used  . Alcohol Use: No    Review of Systems  Constitutional: Positive for chills and fatigue.  Respiratory: Positive for shortness of breath.   Cardiovascular: Negative for chest pain.  Gastrointestinal: Negative for vomiting and abdominal pain.  Neurological: Negative  for syncope.  All other systems reviewed and are negative.     Allergies  Review of patient's allergies indicates no known allergies.  Home Medications   Prior to Admission medications   Medication Sig Start Date End Date Taking? Authorizing Provider  ALOE VERA JUICE PO Take 2 oz by mouth 2 (two) times daily.   Yes Historical Provider, MD  amiodarone (PACERONE) 200 MG tablet Take 0.5 tablets (100 mg total) by mouth daily. 05/22/15  Yes Thompson Grayer, MD  amLODipine (NORVASC) 10 MG tablet Take 0.5 tablets (5 mg total) by mouth daily. 05/22/15  Yes Thompson Grayer, MD  benazepril (LOTENSIN) 40 MG tablet TAKE 1 TABLET BY MOUTH DAILY 02/19/14  Yes Satira Sark, MD   carvedilol (COREG) 3.125 MG tablet TAKE 1 TABLET (3.125 MG TOTAL) BY MOUTH 2 (TWO) TIMES DAILY. 04/02/15  Yes Satira Sark, MD  FIBER PO Take 1 packet by mouth daily. Whole [employer] Brand fiber therapy   Yes Historical Provider, MD  furosemide (LASIX) 40 MG tablet Take 2 tabs (80mg ) by mouth every morning & 1 tab (40mg ) every evening, may take an extra 40mg  in the morning if needed. 07/02/15  Yes Satira Sark, MD  Iron-Folic Acid-Vit 123456 (IRON FORMULA PO) Take 1 tablet by mouth daily. RAW IRON 22MG  - WHOLE FOOD IRON, NON-CONSTIPATING   Yes Historical Provider, MD  KLOR-CON M20 20 MEQ tablet TAKE 1 TABLET BY MOUTH DAILY 09/17/14  Yes Satira Sark, MD  levothyroxine (SYNTHROID, LEVOTHROID) 25 MCG tablet Take 25 mcg by mouth daily.   Yes Historical Provider, MD  Multiple Vitamins-Minerals (THEREMS M PO) Take 1 tablet by mouth daily.     Yes Historical Provider, MD  nitroGLYCERIN (NITROSTAT) 0.4 MG SL tablet Place 1 tablet (0.4 mg total) under the tongue every 5 (five) minutes as needed. Up to 3 doses, if no relief after 3rd dose, proceed to the ED for an evaluation. 12/28/13  Yes Satira Sark, MD  pravastatin (PRAVACHOL) 40 MG tablet TAKE 1 TABLET BY MOUTH EVERY EVENING 05/28/15  Yes Thompson Grayer, MD  Probiotic Product (PROBIOTIC DAILY PO) Take 1 tablet by mouth daily.   Yes Historical Provider, MD  warfarin (COUMADIN) 2 MG tablet Take 1-2 mg by mouth daily. Take 2mg  every day except on mondays take 1mg    Yes Historical Provider, MD   BP 134/72 mmHg  Pulse 60  Temp(Src) 97.8 F (36.6 C) (Oral)  Resp 18  Ht 5\' 5"  (1.651 m)  Wt 83.462 kg  BMI 30.62 kg/m2  SpO2 92% Physical Exam CONSTITUTIONAL: Well developed/well nourished, appears pale HEAD: Normocephalic/atraumatic EYES: EOMI/PERRL ENMT: Mucous membranes moist NECK: supple no meningeal signs SPINE/BACK:entire spine nontender CV: S1/S2 noted, murmur noted LUNGS: Lungs are clear to auscultation bilaterally, no apparent  distress ABDOMEN: soft, nontender, no rebound or guarding, bowel sounds noted throughout abdomen Rectal - stool color brown, nurse present for exam NEURO: Pt is awake/alert/appropriate, moves all extremitiesx4.  No facial droop.   EXTREMITIES: pulses normal/equal, full ROM SKIN: pale PSYCH: no abnormalities of mood noted, alert and oriented to situation  ED Course  Procedures  8:06 PM Pt with symptomatic anemia Pt is on coumadin but no signs of acute GI bleed Will admit Pt agreeable to receive blood products 8:43 PM D/w dr Hal Hope Will admit He requests anemia panel prior to transfusion  Labs Review Labs Reviewed  COMPREHENSIVE METABOLIC PANEL - Abnormal; Notable for the following:    BUN 23 (*)    Creatinine,  Ser 1.42 (*)    Albumin 3.2 (*)    GFR calc non Af Amer 44 (*)    GFR calc Af Amer 51 (*)    All other components within normal limits  CBC - Abnormal; Notable for the following:    RBC 3.72 (*)    Hemoglobin 7.8 (*)    HCT 25.9 (*)    MCV 69.6 (*)    MCH 21.0 (*)    RDW 21.4 (*)    All other components within normal limits  PROTIME-INR - Abnormal; Notable for the following:    Prothrombin Time 22.2 (*)    INR 1.96 (*)    All other components within normal limits  VITAMIN B12  FOLATE  IRON AND TIBC  FERRITIN  RETICULOCYTES  LACTATE DEHYDROGENASE  POC OCCULT BLOOD, ED  TYPE AND SCREEN  PREPARE RBC (CROSSMATCH)   I have personally reviewed and evaluated these lab results as part of my medical decision-making.   MDM   Final diagnoses:  Anemia, unspecified anemia type  Renal insufficiency    Nursing notes including past medical history and social history reviewed and considered in documentation Labs/vital reviewed myself and considered during evaluation     Ripley Fraise, MD 08/01/15 2044

## 2015-08-02 ENCOUNTER — Observation Stay (HOSPITAL_COMMUNITY): Payer: Medicare Other

## 2015-08-02 DIAGNOSIS — I5032 Chronic diastolic (congestive) heart failure: Secondary | ICD-10-CM | POA: Diagnosis not present

## 2015-08-02 DIAGNOSIS — D649 Anemia, unspecified: Secondary | ICD-10-CM | POA: Diagnosis not present

## 2015-08-02 DIAGNOSIS — I480 Paroxysmal atrial fibrillation: Secondary | ICD-10-CM | POA: Diagnosis not present

## 2015-08-02 LAB — VITAMIN B12: Vitamin B-12: 1236 pg/mL — ABNORMAL HIGH (ref 180–914)

## 2015-08-02 LAB — IRON AND TIBC
Iron: 14 ug/dL — ABNORMAL LOW (ref 45–182)
Saturation Ratios: 3 % — ABNORMAL LOW (ref 17.9–39.5)
TIBC: 489 ug/dL — ABNORMAL HIGH (ref 250–450)
UIBC: 475 ug/dL

## 2015-08-02 LAB — CBC
HCT: 26.4 % — ABNORMAL LOW (ref 39.0–52.0)
Hemoglobin: 7.8 g/dL — ABNORMAL LOW (ref 13.0–17.0)
MCH: 20.9 pg — ABNORMAL LOW (ref 26.0–34.0)
MCHC: 29.5 g/dL — ABNORMAL LOW (ref 30.0–36.0)
MCV: 70.6 fL — ABNORMAL LOW (ref 78.0–100.0)
Platelets: 144 10*3/uL — ABNORMAL LOW (ref 150–400)
RBC: 3.74 MIL/uL — ABNORMAL LOW (ref 4.22–5.81)
RDW: 21.4 % — ABNORMAL HIGH (ref 11.5–15.5)
WBC: 4.4 10*3/uL (ref 4.0–10.5)

## 2015-08-02 LAB — FOLATE: Folate: 64.2 ng/mL (ref >5.9)

## 2015-08-02 LAB — HEMOGLOBIN AND HEMATOCRIT, BLOOD
HCT: 29 % — ABNORMAL LOW (ref 39.0–52.0)
Hemoglobin: 8.4 g/dL — ABNORMAL LOW (ref 13.0–17.0)

## 2015-08-02 LAB — BASIC METABOLIC PANEL
Anion gap: 9 (ref 5–15)
BUN: 21 mg/dL — ABNORMAL HIGH (ref 6–20)
CO2: 28 mmol/L (ref 22–32)
Calcium: 9 mg/dL (ref 8.9–10.3)
Chloride: 105 mmol/L (ref 101–111)
Creatinine, Ser: 1.43 mg/dL — ABNORMAL HIGH (ref 0.61–1.24)
GFR calc Af Amer: 50 mL/min — ABNORMAL LOW (ref >60)
GFR calc non Af Amer: 43 mL/min — ABNORMAL LOW (ref >60)
Glucose, Bld: 89 mg/dL (ref 65–99)
Potassium: 3.3 mmol/L — ABNORMAL LOW (ref 3.5–5.1)
Sodium: 142 mmol/L (ref 135–145)

## 2015-08-02 LAB — FERRITIN: Ferritin: 19 ng/mL — ABNORMAL LOW (ref 24–336)

## 2015-08-02 LAB — PROTIME-INR
INR: 2.07 — ABNORMAL HIGH (ref 0.00–1.49)
Prothrombin Time: 23.2 seconds — ABNORMAL HIGH (ref 11.6–15.2)

## 2015-08-02 LAB — LACTATE DEHYDROGENASE: LDH: 172 U/L (ref 98–192)

## 2015-08-02 MED ORDER — POTASSIUM CHLORIDE CRYS ER 20 MEQ PO TBCR
40.0000 meq | EXTENDED_RELEASE_TABLET | Freq: Two times a day (BID) | ORAL | Status: AC
  Administered 2015-08-02 (×2): 40 meq via ORAL
  Filled 2015-08-02 (×2): qty 2

## 2015-08-02 MED ORDER — WARFARIN - PHARMACIST DOSING INPATIENT: Freq: Every day | Status: DC

## 2015-08-02 MED ORDER — FUROSEMIDE 10 MG/ML IJ SOLN
40.0000 mg | Freq: Once | INTRAMUSCULAR | Status: AC
  Administered 2015-08-02: 40 mg via INTRAVENOUS
  Filled 2015-08-02: qty 4

## 2015-08-02 MED ORDER — FUROSEMIDE 40 MG PO TABS
40.0000 mg | ORAL_TABLET | Freq: Every day | ORAL | Status: DC
  Administered 2015-08-03: 40 mg via ORAL
  Filled 2015-08-02: qty 1

## 2015-08-02 MED ORDER — WARFARIN SODIUM 2 MG PO TABS
2.0000 mg | ORAL_TABLET | Freq: Once | ORAL | Status: AC
  Administered 2015-08-02: 2 mg via ORAL
  Filled 2015-08-02: qty 1

## 2015-08-02 NOTE — Care Management Obs Status (Signed)
[place] NOTIFICATION   Patient Details  Name: [name] MRN: [id number] Date of Birth: 02-27-1929   Medicare Observation Status Notification Given:  Yes    Zenon Mayo, RN 08/02/2015, 3:21 PM

## 2015-08-02 NOTE — Progress Notes (Signed)
[facility] PROGRESS NOTE  [name] [id number] DOB: 1928-12-09 DOA: 08/01/2015 PCP: [name], MD  Assessment/Plan: 1. Symptomatic anemia:  - admitted to telemetry and ordered 1 uNIT OF PRBC transfusion, rpeat H&H is still at 26/ 7.8. Repeat H&H tonight and transfuse to keep hemoglobin greater than 8.  His baseline hemoglobin is around 13, last noted from 8/15.  His wife has the paperwork.  Anemia panel shows iron deficiency.  Last colonoscopy was 10 years ago at [place] He reports tarry stools sometimes but also reports using iron pills.  His fecal occult blood is negative.  Repeat stool for occult blood ordered and pending.  GI eval requested for recommendations.   CAD/ S/P cabg: Currently chest pain free.    STAGE3 CKD: Stable.    Acute on chronic diastolic heart failure: slightly fluid overloaded on exam.  On lasix,  CXR shows some pleural effusion,.  Monitor electrolytes on lasix.    PAF: On coumadin and amiodarone for rate control..  Holding coumadin tonight till we get Gi eval.    Code Status: DNR Family Communication: eife at bedside Disposition Plan: pending further investigation   Consultants:  GI  Procedures:  NONE  Antibiotics:  NONE  HPI/Subjective: Feeling much better today.   Objective: Filed Vitals:   08/02/15 1229 08/02/15 1356  BP: 132/69 120/53  Pulse: 59 60  Temp: 97.6 F (36.4 C) 98.1 F (36.7 C)  Resp: 20 19    Intake/Output Summary (Last 24 hours) at 08/02/15 1755 Last data filed at 08/02/15 1136  Gross per 24 hour  Intake    588 ml  Output   1000 ml  Net   -412 ml   Filed Weights   08/01/15 1607 08/02/15 0103  Weight: 83.462 kg (184 lb) 82.9 kg (182 lb 12.2 oz)    Exam:   General:  Alert afebrile comfortable.   Cardiovascular: s1s2  Respiratory: ctab  Abdomen: soft non tender non distended bowel ounds heard  Musculoskeletal: bilateral  pedal edema.   Data Reviewed: Basic Metabolic  Panel:  Recent Labs Lab 08/01/15 1626 08/02/15 0643  NA 140 142  K 3.6 3.3*  CL 103 105  CO2 28 28  GLUCOSE 94 89  BUN 23* 21*  CREATININE 1.42* 1.43*  CALCIUM 9.2 9.0   Liver Function Tests:  Recent Labs Lab 08/01/15 1626  AST 32  ALT 26  ALKPHOS 112  BILITOT 1.0  PROT 6.8  ALBUMIN 3.2*   No results for input(s): LIPASE, AMYLASE in the last 168 hours. No results for input(s): AMMONIA in the last 168 hours. CBC:  Recent Labs Lab 08/01/15 1626 08/02/15 0643  WBC 4.8 4.4  HGB 7.8* 7.8*  HCT 25.9* 26.4*  MCV 69.6* 70.6*  PLT 166 144*   Cardiac Enzymes: No results for input(s): CKTOTAL, CKMB, CKMBINDEX, TROPONINI in the last 168 hours. BNP (last 3 results) No results for input(s): BNP in the last 8760 hours.  ProBNP (last 3 results) No results for input(s): PROBNP in the last 8760 hours.  CBG: No results for input(s): GLUCAP in the last 168 hours.  No results found for this or any previous visit (from the past 240 hour(s)).   Studies: Dg Chest Port 1 View  08/02/2015  CLINICAL DATA:  Shortness of breath. EXAM: PORTABLE CHEST 1 VIEW COMPARISON:  March 12, 2011. FINDINGS: Stable cardiomegaly. Status post coronary artery bypass graft. Right-sided pacemaker is unchanged in position. No pneumothorax is noted. Left lung is clear. Mild  right pleural effusion is noted. Mild right basilar subsegmental atelectasis is noted as well. Bony thorax is unremarkable. IMPRESSION: Interval development of mild right basilar subsegmental atelectasis and mild right pleural effusion. Electronically Signed   By: Marijo Conception, M.D.   On: 08/02/2015 11:56    Scheduled Meds: . amiodarone  100 mg Oral Daily  . amLODipine  5 mg Oral Daily  . benazepril  40 mg Oral Daily  . carvedilol  3.125 mg Oral BID WC  . furosemide  40 mg Oral q1800  . furosemide  80 mg Oral QAC breakfast  . levothyroxine  25 mcg Oral QAC breakfast  . potassium chloride  40 mEq Oral BID  . pravastatin  40 mg  Oral QPM  . sodium chloride  3 mL Intravenous Q12H  . Warfarin - Pharmacist Dosing Inpatient   Does not apply q1800   Continuous Infusions:   Principal Problem:   Symptomatic anemia Active Problems:   Obstructive sleep apnea   Paroxysmal atrial fibrillation (HCC)   Chronic diastolic CHF (congestive heart failure) ([place])    Time spent: 25 minutes.     [facility] Pager [phone] . If 7PM-7AM, please contact night-coverage at [email or web address], password [facility] 08/02/2015, 5:55 PM  LOS: 1 day

## 2015-08-02 NOTE — Progress Notes (Signed)
NURSING PROGRESS NOTE  [name] [name]:[id number] Admission Data: 08/02/2015 2:39 AM Attending Provider: Rise Patience, MD [id number], Riley Rack, MD Code Status: Full  Allergies:  Review of patient's allergies indicates no known allergies. Past Medical History:   has a past medical history of Symptomatic bradycardia; Atrial flutter ([place]); Cardiomyopathy; Coronary atherosclerosis of native coronary artery; Valvular heart disease; Endocarditis; Paroxysmal atrial fibrillation ([place]); Carotid artery disease ([place]); Essential hypertension, benign; Benign prostatic hyperplasia; Carpal tunnel syndrome; Warfarin anticoagulation; and Arthritis. Past Surgical History:   has past surgical history that includes Coronary artery bypass graft (2008); Total hip arthroplasty; Knee arthroscopy; Carpal tunnel release (2009); Pacemaker insertion (2009); Aortic valve replacement; Tonsillectomy (1939); Hernia repair (age 57); Patella fracture surgery (age 23); and Cataract extraction, bilateral (07/2011). Social History:   reports that he quit smoking about 61 years ago. His smoking use included Cigarettes. He started smoking about 71 years ago. He has a 2.4 pack-year smoking history. He has never used smokeless tobacco. He reports that he does not drink alcohol or use illicit drugs.  [name] is a 82 y.o. male patient admitted from ED:   Last Documented Vital Signs: Blood pressure 124/59, pulse 58, temperature 98.5 F (36.9 C), temperature source Oral, resp. rate 18, height 5\' 5"  (1.651 m), weight 82.9 kg (182 lb 12.2 oz), SpO2 92 %.  Cardiac Monitoring: Box # 6 in place.   IV Fluids:  IV in place, occlusive dsg intact without redness, IV cath forearm right, condition patent and no redness none.   Skin:Bruising on bilateral arms and legs. Scab on right lower leg, cut on right hand.  Patient/Family orientated to room. Information packet given to patient/family. Admission inpatient armband information  verified with patient/family to include name and date of birth and placed on patient arm. Side rails up x 2, fall assessment and education completed with patient/family. Patient/family able to verbalize understanding of risk associated with falls and verbalized understanding to call for assistance before getting out of bed. Call light within reach. Patient/family able to voice and demonstrate understanding of unit orientation instructions.    Will continue to evaluate and treat per MD orders.  Riley Hakim RN, BSN

## 2015-08-02 NOTE — Progress Notes (Signed)
Called by RN at 2212 that patient would like to go on CPAP. Upon arrival patient was in bathroom with tech in room at 2220. RN aware.

## 2015-08-02 NOTE — Progress Notes (Signed)
ANTICOAGULATION CONSULT NOTE - Follow-Up Consult  Pharmacy Consult for Warfarin  Indication: atrial fibrillation  No Known Allergies  Patient Measurements: Height: 5\' 5"  (165.1 cm) Weight: 182 lb 12.2 oz (82.9 kg) IBW/kg (Calculated) : 61.5  Vital Signs: Temp: 98.1 F (36.7 C) (01/06 1356) Temp Source: Oral (01/06 1356) BP: 120/53 mmHg (01/06 1356) Pulse Rate: 60 (01/06 1356)  Labs:  Recent Labs  08/01/15 1626 08/02/15 0643  HGB 7.8* 7.8*  HCT 25.9* 26.4*  PLT 166 144*  LABPROT 22.2* 23.2*  INR 1.96* 2.07*  CREATININE 1.42* 1.43*   Estimated Creatinine Clearance: 38.1 mL/min (by C-G formula based on Cr of 1.43).  Medical History: Past Medical History  Diagnosis Date  . Symptomatic bradycardia   . Atrial flutter (HCC)     Amiodarone therapy, coumadin therapy, prior history of cardioversion and overdrive pacing  . Cardiomyopathy     Mixed ischemic and nonischemic with previous EF of 35-40%, most recently in June 2009 normalized to 55-60%. Echo 2/11 EF 55-60%  . Coronary atherosclerosis of native coronary artery     Status post coronary bypass grafting  . Valvular heart disease     Status post by prosthetic aortic valve and mitral valve repair  . Endocarditis     s/p PPM extraction by Dr Lovena Le 2012 endocarditis with enterococcus faecalis  . Paroxysmal atrial fibrillation (HCC)     Amiodarone therapy, status post Cox-Maze procedure but with recurrent atrial fibrillation while on amiodarone August 2012  . Carotid artery disease (HCC)     Less than 50% bilateral internal carotid artery stenosis by Doppler 2009  . Essential hypertension, benign   . Benign prostatic hyperplasia   . Carpal tunnel syndrome   . Warfarin anticoagulation   . Arthritis     Assessment: 82 y/o M sent from PCP with low Hgb.  Hgb is 7.8 on admit (baseline 9-10 per MD note). Hgb remains 7.8 despite blood transfusion.  MD concerned about ongoing bleed and asked that Coumadin dose be held  tonight.    Goal of Therapy:  INR 2-3 Monitor platelets by anticoagulation protocol: Yes   Plan:  -No Coumadin tonight -Daily PT/INR -Monitor for bleeding -F/U low Hgb work-up  Aziza Stuckert, Pharm.D., BCPS Clinical Pharmacist Pager [phone] 08/02/2015 2:43 PM

## 2015-08-03 DIAGNOSIS — D649 Anemia, unspecified: Secondary | ICD-10-CM | POA: Diagnosis not present

## 2015-08-03 DIAGNOSIS — I480 Paroxysmal atrial fibrillation: Secondary | ICD-10-CM | POA: Diagnosis not present

## 2015-08-03 LAB — CBC
HCT: 26.4 % — ABNORMAL LOW (ref 39.0–52.0)
HCT: 30.8 % — ABNORMAL LOW (ref 39.0–52.0)
Hemoglobin: 7.8 g/dL — ABNORMAL LOW (ref 13.0–17.0)
Hemoglobin: 9.2 g/dL — ABNORMAL LOW (ref 13.0–17.0)
MCH: 21.4 pg — ABNORMAL LOW (ref 26.0–34.0)
MCH: 21.5 pg — ABNORMAL LOW (ref 26.0–34.0)
MCHC: 29.5 g/dL — ABNORMAL LOW (ref 30.0–36.0)
MCHC: 29.9 g/dL — ABNORMAL LOW (ref 30.0–36.0)
MCV: 72.3 fL — ABNORMAL LOW (ref 78.0–100.0)
MCV: 72 fL — ABNORMAL LOW (ref 78.0–100.0)
Platelets: 135 10*3/uL — ABNORMAL LOW (ref 150–400)
Platelets: 152 10*3/uL (ref 150–400)
RBC: 3.65 MIL/uL — ABNORMAL LOW (ref 4.22–5.81)
RBC: 4.28 MIL/uL (ref 4.22–5.81)
RDW: 21.3 % — ABNORMAL HIGH (ref 11.5–15.5)
RDW: 20.7 % — ABNORMAL HIGH (ref 11.5–15.5)
WBC: 5 10*3/uL (ref 4.0–10.5)
WBC: 5.7 10*3/uL (ref 4.0–10.5)

## 2015-08-03 LAB — OCCULT BLOOD X 1 CARD TO LAB, STOOL: Fecal Occult Bld: NEGATIVE

## 2015-08-03 LAB — PREPARE RBC (CROSSMATCH)

## 2015-08-03 LAB — BASIC METABOLIC PANEL
Anion gap: 11 (ref 5–15)
BUN: 24 mg/dL — ABNORMAL HIGH (ref 6–20)
CO2: 27 mmol/L (ref 22–32)
Calcium: 9.1 mg/dL (ref 8.9–10.3)
Chloride: 105 mmol/L (ref 101–111)
Creatinine, Ser: 1.36 mg/dL — ABNORMAL HIGH (ref 0.61–1.24)
GFR calc Af Amer: 53 mL/min — ABNORMAL LOW (ref >60)
GFR calc non Af Amer: 46 mL/min — ABNORMAL LOW (ref >60)
Glucose, Bld: 100 mg/dL — ABNORMAL HIGH (ref 65–99)
Potassium: 3.6 mmol/L (ref 3.5–5.1)
Sodium: 143 mmol/L (ref 135–145)

## 2015-08-03 LAB — PROTIME-INR
INR: 2.06 — ABNORMAL HIGH (ref 0.00–1.49)
Prothrombin Time: 23.1 seconds — ABNORMAL HIGH (ref 11.6–15.2)

## 2015-08-03 MED ORDER — WARFARIN SODIUM 2 MG PO TABS
2.0000 mg | ORAL_TABLET | ORAL | Status: AC
  Administered 2015-08-03: 2 mg via ORAL
  Filled 2015-08-03: qty 1

## 2015-08-03 MED ORDER — SODIUM CHLORIDE 0.9 % IV SOLN
500.0000 mg | Freq: Once | INTRAVENOUS | Status: AC
  Administered 2015-08-03: 500 mg via INTRAVENOUS
  Filled 2015-08-03: qty 10

## 2015-08-03 MED ORDER — SODIUM CHLORIDE 0.9 % IV SOLN
Freq: Once | INTRAVENOUS | Status: AC
  Administered 2015-08-03: 12:00:00 via INTRAVENOUS

## 2015-08-03 MED ORDER — WARFARIN - PHARMACIST DOSING INPATIENT: Freq: Every day | Status: DC

## 2015-08-03 MED ORDER — SODIUM CHLORIDE 0.9 % IV SOLN
25.0000 mg | Freq: Once | INTRAVENOUS | Status: AC
  Administered 2015-08-03: 25 mg via INTRAVENOUS
  Filled 2015-08-03: qty 0.5

## 2015-08-03 NOTE — Progress Notes (Signed)
Pt. States he can place cpap on when he is ready. RT informed pt. To notify if he needed any assistance. 

## 2015-08-03 NOTE — Progress Notes (Signed)
[facility] PROGRESS NOTE  [name] [id number] DOB: 05/01/1929 DOA: 08/01/2015 PCP: [name], MD  Assessment/Plan: 1. Symptomatic anemia:  - admitted to telemetry and ordered 1 uNIT OF PRBC transfusion, rpeat H&H is still at 26/ 7.8. Repeat H&H tonight and transfuse to keep hemoglobin greater than 8.  His baseline hemoglobin is around 13, last noted from 8/15.  His wife has the paperwork.  Anemia panel shows iron deficiency.  Last colonoscopy was 10 years ago at [place] He reports tarry stools sometimes but also reports using iron pills.  His fecal occult blood is negative on two different specimens Repeat stool for occult blood ordered and negative. GI eval requested for recommendations,but referred to outpatient work up.  CAD/ S/P cabg: Currently chest pain free.    STAGE3 CKD: Stable.    Acute on chronic diastolic heart failure: slightly fluid overloaded on exam.  On lasix,  CXR shows some pleural effusion,.  Monitor electrolytes on lasix.    PAF: On coumadin and amiodarone for rate control..  Resume coumadin.    Code Status: DNR Family Communication: wife at bedside Disposition Plan: d/c in am home.    Consultants:  GI  Procedures:  NONE  Antibiotics:  NONE  HPI/Subjective: Feeling much better today.   Objective: Filed Vitals:   08/03/15 1407 08/03/15 1443  BP: 122/56 124/58  Pulse: 65 67  Temp: 98.4 F (36.9 C) 98.4 F (36.9 C)  Resp: 20 18    Intake/Output Summary (Last 24 hours) at 08/03/15 2012 Last data filed at 08/03/15 1445  Gross per 24 hour  Intake    635 ml  Output    515 ml  Net    120 ml   Filed Weights   08/01/15 1607 08/02/15 0103 08/03/15 0614  Weight: 83.462 kg (184 lb) 82.9 kg (182 lb 12.2 oz) 80.2 kg (176 lb 12.9 oz)    Exam:   General:  Alert afebrile comfortable.   Cardiovascular: s1s2  Respiratory: ctab  Abdomen: soft non tender non distended bowel ounds heard  Musculoskeletal:  bilateral  pedal edema.   Data Reviewed: Basic Metabolic Panel:  Recent Labs Lab 08/01/15 1626 08/02/15 0643 08/03/15 1810  NA 140 142 143  K 3.6 3.3* 3.6  CL 103 105 105  CO2 28 28 27   GLUCOSE 94 89 100*  BUN 23* 21* 24*  CREATININE 1.42* 1.43* 1.36*  CALCIUM 9.2 9.0 9.1   Liver Function Tests:  Recent Labs Lab 08/01/15 1626  AST 32  ALT 26  ALKPHOS 112  BILITOT 1.0  PROT 6.8  ALBUMIN 3.2*   No results for input(s): LIPASE, AMYLASE in the last 168 hours. No results for input(s): AMMONIA in the last 168 hours. CBC:  Recent Labs Lab 08/01/15 1626 08/02/15 0643 08/02/15 1930 08/03/15 0705 08/03/15 1810  WBC 4.8 4.4  --  5.0 5.7  HGB 7.8* 7.8* 8.4* 7.8* 9.2*  HCT 25.9* 26.4* 29.0* 26.4* 30.8*  MCV 69.6* 70.6*  --  72.3* 72.0*  PLT 166 144*  --  135* 152   Cardiac Enzymes: No results for input(s): CKTOTAL, CKMB, CKMBINDEX, TROPONINI in the last 168 hours. BNP (last 3 results) No results for input(s): BNP in the last 8760 hours.  ProBNP (last 3 results) No results for input(s): PROBNP in the last 8760 hours.  CBG: No results for input(s): GLUCAP in the last 168 hours.  No results found for this or any previous visit (from the past 240 hour(s)).  Studies: Dg Chest Port 1 View  08/02/2015  CLINICAL DATA:  Shortness of breath. EXAM: PORTABLE CHEST 1 VIEW COMPARISON:  March 12, 2011. FINDINGS: Stable cardiomegaly. Status post coronary artery bypass graft. Right-sided pacemaker is unchanged in position. No pneumothorax is noted. Left lung is clear. Mild right pleural effusion is noted. Mild right basilar subsegmental atelectasis is noted as well. Bony thorax is unremarkable. IMPRESSION: Interval development of mild right basilar subsegmental atelectasis and mild right pleural effusion. Electronically Signed   By: Marijo Conception, M.D.   On: 08/02/2015 11:56    Scheduled Meds: . amiodarone  100 mg Oral Daily  . amLODipine  5 mg Oral Daily  . benazepril  40  mg Oral Daily  . carvedilol  3.125 mg Oral BID WC  . furosemide  40 mg Oral Q1500  . furosemide  80 mg Oral QAC breakfast  . iron dextran (INFED/DEXFERRUM) infusion  500 mg Intravenous Once  . levothyroxine  25 mcg Oral QAC breakfast  . pravastatin  40 mg Oral QPM  . sodium chloride  3 mL Intravenous Q12H   Continuous Infusions:   Principal Problem:   Symptomatic anemia Active Problems:   Obstructive sleep apnea   Paroxysmal atrial fibrillation (HCC)   Chronic diastolic CHF (congestive heart failure) ([place])    Time spent: 25 minutes.     [facility] Pager [phone] . If 7PM-7AM, please contact night-coverage at [email or web address], password [facility] 08/03/2015, 8:12 PM  LOS: 2 days

## 2015-08-03 NOTE — Progress Notes (Signed)
ANTICOAGULATION CONSULT NOTE - Initial Consult  Pharmacy Consult for Coumadin Indication: atrial fibrillation  No Known Allergies  Patient Measurements: Height: 5\' 5"  (165.1 cm) Weight: 176 lb 12.9 oz (80.2 kg) IBW/kg (Calculated) : 61.5   Vital Signs: Temp: 98.4 F (36.9 C) (01/07 1443) Temp Source: Oral (01/07 1443) BP: 124/58 mmHg (01/07 1443) Pulse Rate: 67 (01/07 1443)  Labs:  Recent Labs  08/01/15 1626 08/02/15 0643 08/02/15 1930 08/03/15 0705 08/03/15 1810  HGB 7.8* 7.8* 8.4* 7.8* 9.2*  HCT 25.9* 26.4* 29.0* 26.4* 30.8*  PLT 166 144*  --  135* 152  LABPROT 22.2* 23.2*  --  23.1*  --   INR 1.96* 2.07*  --  2.06*  --   CREATININE 1.42* 1.43*  --   --  1.36*    Estimated Creatinine Clearance: 39.5 mL/min (by C-G formula based on Cr of 1.36).   Medical History: Past Medical History  Diagnosis Date  . Symptomatic bradycardia   . Atrial flutter (HCC)     Amiodarone therapy, coumadin therapy, prior history of cardioversion and overdrive pacing  . Cardiomyopathy     Mixed ischemic and nonischemic with previous EF of 35-40%, most recently in June 2009 normalized to 55-60%. Echo 2/11 EF 55-60%  . Coronary atherosclerosis of native coronary artery     Status post coronary bypass grafting  . Valvular heart disease     Status post by prosthetic aortic valve and mitral valve repair  . Endocarditis     s/p PPM extraction by Dr Lovena Le 2012 endocarditis with enterococcus faecalis  . Paroxysmal atrial fibrillation (HCC)     Amiodarone therapy, status post Cox-Maze procedure but with recurrent atrial fibrillation while on amiodarone August 2012  . Carotid artery disease (HCC)     Less than 50% bilateral internal carotid artery stenosis by Doppler 2009  . Essential hypertension, benign   . Benign prostatic hyperplasia   . Carpal tunnel syndrome   . Warfarin anticoagulation   . Arthritis     Assessment: 82 y/o M sent from PCP with low Hgb, Hgb is 7.8 on admit  (baseline 9-10 per MD note). Pt is on warfarin PTA for afib and INR on admit is 1.96, last dose of warfarin was 1/4.   PTA dose 2mg  daily except 1mg  on Mondays  Anticoagulation: Coumadin PTA for afib - Per Dr. Karleen Hampshire, hold Coumadin 1/6.  INR 2.06. Hgb up to 9.2 after transfusion. He reports tarry stools sometimes but also reports using iron pills. His fecal occult blood is negative on two different specimens. Repeat stool for occult blood ordered and negative. GI eval requested for recommendations,but referred to outpatient work up.    Goal of Therapy:  INR 2-3 Monitor platelets by anticoagulation protocol: Yes   Plan:  Coumadin 2mg  po x 1 tonight. Daily INR.   Sarely Stracener S. Alford Highland, PharmD, BCPS Clinical Staff Pharmacist Pager [phone]  Eilene Ghazi Stillinger 08/03/2015,8:20 PM

## 2015-08-04 DIAGNOSIS — D649 Anemia, unspecified: Secondary | ICD-10-CM | POA: Diagnosis not present

## 2015-08-04 DIAGNOSIS — I5032 Chronic diastolic (congestive) heart failure: Secondary | ICD-10-CM | POA: Diagnosis not present

## 2015-08-04 DIAGNOSIS — I480 Paroxysmal atrial fibrillation: Secondary | ICD-10-CM | POA: Diagnosis not present

## 2015-08-04 LAB — TYPE AND SCREEN
ABO/RH(D): A POS
Antibody Screen: NEGATIVE
Unit division: 0
Unit division: 0

## 2015-08-04 LAB — PROTIME-INR
INR: 1.82 — ABNORMAL HIGH (ref 0.00–1.49)
Prothrombin Time: 21 seconds — ABNORMAL HIGH (ref 11.6–15.2)

## 2015-08-04 MED ORDER — WARFARIN SODIUM 2 MG PO TABS
2.0000 mg | ORAL_TABLET | Freq: Once | ORAL | Status: DC
  Filled 2015-08-04: qty 1

## 2015-08-04 NOTE — Progress Notes (Signed)
ANTICOAGULATION CONSULT NOTE  Pharmacy Consult for Coumadin Indication: atrial fibrillation  No Known Allergies  Patient Measurements: Height: 5\' 5"  (165.1 cm) Weight: 181 lb 10.5 oz (82.4 kg) IBW/kg (Calculated) : 61.5   Vital Signs: Temp: 98.2 F (36.8 C) (01/08 0514) Temp Source: Oral (01/08 0514) BP: 117/59 mmHg (01/08 0514) Pulse Rate: 59 (01/08 0514)  Labs:  Recent Labs  08/01/15 1626 08/02/15 0643 08/02/15 1930 08/03/15 0705 08/03/15 1810 08/04/15 0823  HGB 7.8* 7.8* 8.4* 7.8* 9.2*  --   HCT 25.9* 26.4* 29.0* 26.4* 30.8*  --   PLT 166 144*  --  135* 152  --   LABPROT 22.2* 23.2*  --  23.1*  --  21.0*  INR 1.96* 2.07*  --  2.06*  --  1.82*  CREATININE 1.42* 1.43*  --   --  1.36*  --     Estimated Creatinine Clearance: 40 mL/min (by C-G formula based on Cr of 1.36).  Assessment: 82 y/o M sent from PCP with low Hgb. He reports having tarry stools, but also uses PO iron. Warfarin was held on admission d/t concern for bleed. GI has seen and recommended outpatient workup. Warfarin resumed last evening. INR 1.82, Hgb 9.2, plts 152- no overt bleeding noted.  Home dose of warfarin is 2mg  daily except 1mg  on Mondays.  Goal of Therapy:  INR 2-3 Monitor platelets by anticoagulation protocol: Yes   Plan:  -warfarin 2mg  po x 1 tonight -Daily INR -follow for s/s bleeding  Benedetto Ryder D. Nyara Capell, PharmD, BCPS Clinical Pharmacist Pager: [phone] 08/04/2015 11:42 AM

## 2015-08-04 NOTE — Progress Notes (Signed)
08/04/15 Patient to be discharged home today, IV site removed and discharge instructions reviewed with patient.

## 2015-08-04 NOTE — Discharge Summary (Signed)
Physician Discharge Summary  [name] [id number] DOB: 11-30-1928 DOA: 08/01/2015  PCP: [name], MD  Admit date: 08/01/2015 Discharge date: 08/04/2015  Time spent: 25 minutes  Recommendations for Outpatient Follow-up:  1. Follow up with CBC in one week.  2. Please follow up with PCP in 1 to 2 weeks) 3. Follow up with a gastroenterologist with a referral from your PCP.   Discharge Diagnoses:  Principal Problem:   Symptomatic anemia Active Problems:   Obstructive sleep apnea   Paroxysmal atrial fibrillation (HCC)   Chronic diastolic CHF (congestive heart failure) ([place])   Discharge Condition: improved.   Diet recommendation: LOW SODIUM DIET  Filed Weights   08/02/15 0103 08/03/15 0614 08/04/15 0447  Weight: 82.9 kg (182 lb 12.2 oz) 80.2 kg (176 lb 12.9 oz) 82.4 kg (181 lb 10.5 oz)    History of present illness:  [name] is a 82 y.o. male with history of CAD status post CABG, paroxysmal atrial fibrillation, diastolic CHF, chronic kidney disease, OSA, pacemaker placement was referred to the ER after patient's routine blood work showed low hemoglobin  Hospital Course:  1. Symptomatic anemia: - admitted to telemetry and ordered 2 units of PRBC transfusion, repeat H&H  Improved to 9.2.   His baseline hemoglobin is around 13, last noted from 8/15.  Anemia panel shows iron deficiency. IV iron infusion done yesterday. Last colonoscopy was 10 years ago at [place] He reports tarry stools sometimes but also reports using iron pills.  His fecal occult blood is negative on two different specimens Repeat stool for occult blood ordered and negative. GI eval requested for recommendations,but referred to outpatient work up as he is stable and stool for occult blood is negative.    CAD/ S/P CABG: Currently chest pain free.    STAGE 3 CKD: Stable.    Acute on chronic diastolic heart failure: slightly fluid overloaded on exam.  On lasix,  CXR shows some pleural  effusion,.  Monitor electrolytes on lasix.    PAF: On coumadin and amiodarone for rate control..  Resume coumadin.     Procedures:  NONE  Consultations:  GI consult with Dr Benson [place].  Discharge Exam: Filed Vitals:   08/03/15 2116 08/04/15 0514  BP: 131/64 117/59  Pulse: 59 59  Temp: 98 F (36.7 C) 98.2 F (36.8 C)  Resp: 18 18    General: alert afebrile comfortable.  Cardiovascular: s1s2 Respiratory: ctab., diminished at bases.   Discharge Instructions   Discharge Instructions    Diet - low sodium heart healthy    Complete by:  As directed      Discharge instructions    Complete by:  As directed   Follow up with gastroenterology in 2 to4 weeks. Please check your hemoglobin at your PCP office in one week.          Current Discharge Medication List    CONTINUE these medications which have NOT CHANGED   Details  ALOE VERA JUICE PO Take 2 oz by mouth 2 (two) times daily.    amiodarone (PACERONE) 200 MG tablet Take 0.5 tablets (100 mg total) by mouth daily. Qty: 30 tablet, Refills: 6    amLODipine (NORVASC) 10 MG tablet Take 0.5 tablets (5 mg total) by mouth daily. Qty: 30 tablet, Refills: 6    benazepril (LOTENSIN) 40 MG tablet TAKE 1 TABLET BY MOUTH DAILY Qty: 30 tablet, Refills: 6    carvedilol (COREG) 3.125 MG tablet TAKE 1 TABLET (3.125 MG TOTAL) BY  MOUTH 2 (TWO) TIMES DAILY. Qty: 60 tablet, Refills: 3    FIBER PO Take 1 packet by mouth daily. [employer] Brand fiber therapy    furosemide (LASIX) 40 MG tablet Take 2 tabs (80mg ) by mouth every morning & 1 tab (40mg ) every evening, may take an extra 40mg  in the morning if needed.    Iron-Folic Acid-Vit 123456 (IRON FORMULA PO) Take 1 tablet by mouth daily. RAW IRON 22MG  - WHOLE FOOD IRON, NON-CONSTIPATING    KLOR-CON M20 20 MEQ tablet TAKE 1 TABLET BY MOUTH DAILY Qty: 30 tablet, Refills: 5    levothyroxine (SYNTHROID, LEVOTHROID) 25 MCG tablet Take 25 mcg by mouth daily.    Multiple Vitamins-Minerals  (THEREMS M PO) Take 1 tablet by mouth daily.      nitroGLYCERIN (NITROSTAT) 0.4 MG SL tablet Place 1 tablet (0.4 mg total) under the tongue every 5 (five) minutes as needed. Up to 3 doses, if no relief after 3rd dose, proceed to the ED for an evaluation. Qty: 25 tablet, Refills: 3    pravastatin (PRAVACHOL) 40 MG tablet TAKE 1 TABLET BY MOUTH EVERY EVENING Qty: 30 tablet, Refills: 6    Probiotic Product (PROBIOTIC DAILY PO) Take 1 tablet by mouth daily.    warfarin (COUMADIN) 2 MG tablet Take 1-2 mg by mouth daily. Take 2mg  every day except on mondays take 1mg        No Known Allergies Follow-up Information    Follow up with Bonita Quin III, MD. Schedule an appointment as soon as possible for a visit in 1 week.   Specialty:  Internal Medicine   Contact information:   [address] [place] [address] [phone]        The results of significant diagnostics from this hospitalization (including imaging, microbiology, ancillary and laboratory) are listed below for reference.    Significant Diagnostic Studies: Dg Chest Port 1 View  08/02/2015  CLINICAL DATA:  Shortness of breath. EXAM: PORTABLE CHEST 1 VIEW COMPARISON:  March 12, 2011. FINDINGS: Stable cardiomegaly. Status post coronary artery bypass graft. Right-sided pacemaker is unchanged in position. No pneumothorax is noted. Left lung is clear. Mild right pleural effusion is noted. Mild right basilar subsegmental atelectasis is noted as well. Bony thorax is unremarkable. IMPRESSION: Interval development of mild right basilar subsegmental atelectasis and mild right pleural effusion. Electronically Signed   By: Marijo Conception, M.D.   On: 08/02/2015 11:56    Microbiology: No results found for this or any previous visit (from the past 240 hour(s)).   Labs: Basic Metabolic Panel:  Recent Labs Lab 08/01/15 1626 08/02/15 0643 08/03/15 1810  NA 140 142 143  K 3.6 3.3* 3.6  CL 103 105 105  CO2 28 28 27    GLUCOSE 94 89 100*  BUN 23* 21* 24*  CREATININE 1.42* 1.43* 1.36*  CALCIUM 9.2 9.0 9.1   Liver Function Tests:  Recent Labs Lab 08/01/15 1626  AST 32  ALT 26  ALKPHOS 112  BILITOT 1.0  PROT 6.8  ALBUMIN 3.2*   No results for input(s): LIPASE, AMYLASE in the last 168 hours. No results for input(s): AMMONIA in the last 168 hours. CBC:  Recent Labs Lab 08/01/15 1626 08/02/15 0643 08/02/15 1930 08/03/15 0705 08/03/15 1810  WBC 4.8 4.4  --  5.0 5.7  HGB 7.8* 7.8* 8.4* 7.8* 9.2*  HCT 25.9* 26.4* 29.0* 26.4* 30.8*  MCV 69.6* 70.6*  --  72.3* 72.0*  PLT 166 144*  --  135* 152  Cardiac Enzymes: No results for input(s): CKTOTAL, CKMB, CKMBINDEX, TROPONINI in the last 168 hours. BNP: BNP (last 3 results) No results for input(s): BNP in the last 8760 hours.  ProBNP (last 3 results) No results for input(s): PROBNP in the last 8760 hours.  CBG: No results for input(s): GLUCAP in the last 168 hours.     SignedHosie Poisson MD.  [facility] 08/04/2015, 12:39 PM

## 2015-08-06 ENCOUNTER — Other Ambulatory Visit: Payer: Self-pay | Admitting: Cardiology

## 2015-08-14 ENCOUNTER — Encounter: Payer: Self-pay | Admitting: Internal Medicine

## 2015-08-21 ENCOUNTER — Telehealth: Payer: Self-pay | Admitting: Cardiology

## 2015-08-21 ENCOUNTER — Ambulatory Visit (INDEPENDENT_AMBULATORY_CARE_PROVIDER_SITE_OTHER): Payer: Medicare Other | Admitting: *Deleted

## 2015-08-21 DIAGNOSIS — I495 Sick sinus syndrome: Secondary | ICD-10-CM

## 2015-08-21 NOTE — Telephone Encounter (Signed)
LMOVM reminding pt to send remote transmission.   

## 2015-08-22 NOTE — Progress Notes (Signed)
Remote pacemaker transmission.   

## 2015-08-29 ENCOUNTER — Other Ambulatory Visit: Payer: Self-pay | Admitting: Cardiology

## 2015-08-30 LAB — BASIC METABOLIC PANEL
BUN: 24 mg/dL (ref 7–25)
CO2: 29 mmol/L (ref 20–31)
Calcium: 9.2 mg/dL (ref 8.6–10.3)
Chloride: 102 mmol/L (ref 98–110)
Creat: 1.1 mg/dL (ref 0.70–1.11)
Glucose, Bld: 123 mg/dL — ABNORMAL HIGH (ref 65–99)
Potassium: 4.1 mmol/L (ref 3.5–5.3)
Sodium: 139 mmol/L (ref 135–146)

## 2015-08-30 LAB — TSH: TSH: 8.727 u[IU]/mL — ABNORMAL HIGH (ref 0.350–4.500)

## 2015-09-04 LAB — CUP PACEART REMOTE DEVICE CHECK
Battery Impedance: 630 Ohm
Battery Remaining Longevity: 62 mo
Battery Voltage: 2.77 V
Brady Statistic AP VP Percent: 99 %
Brady Statistic AP VS Percent: 1 %
Brady Statistic AS VP Percent: 0 %
Brady Statistic AS VS Percent: 0 %
Date Time Interrogation Session: 20170125210336
Implantable Lead Implant Date: 20120815
Implantable Lead Implant Date: 20120815
Implantable Lead Location: 753859
Implantable Lead Location: 753860
Implantable Lead Model: 5076
Implantable Lead Model: 5076
Lead Channel Impedance Value: 423 Ohm
Lead Channel Impedance Value: 464 Ohm
Lead Channel Pacing Threshold Amplitude: 1 V
Lead Channel Pacing Threshold Pulse Width: 0.4 ms
Lead Channel Setting Pacing Amplitude: 2.5 V
Lead Channel Setting Pacing Amplitude: 2.5 V
Lead Channel Setting Pacing Pulse Width: 0.4 ms
Lead Channel Setting Sensing Sensitivity: 2.8 mV

## 2015-09-05 ENCOUNTER — Encounter: Payer: Self-pay | Admitting: Cardiology

## 2015-09-05 ENCOUNTER — Ambulatory Visit (INDEPENDENT_AMBULATORY_CARE_PROVIDER_SITE_OTHER): Payer: Medicare Other | Admitting: Cardiology

## 2015-09-05 VITALS — BP 128/68 | HR 60 | Ht 66.0 in | Wt 174.0 lb

## 2015-09-05 DIAGNOSIS — D649 Anemia, unspecified: Secondary | ICD-10-CM

## 2015-09-05 DIAGNOSIS — I5032 Chronic diastolic (congestive) heart failure: Secondary | ICD-10-CM | POA: Diagnosis not present

## 2015-09-05 DIAGNOSIS — I480 Paroxysmal atrial fibrillation: Secondary | ICD-10-CM | POA: Diagnosis not present

## 2015-09-05 DIAGNOSIS — I38 Endocarditis, valve unspecified: Secondary | ICD-10-CM | POA: Diagnosis not present

## 2015-09-05 DIAGNOSIS — I2510 Atherosclerotic heart disease of native coronary artery without angina pectoris: Secondary | ICD-10-CM

## 2015-09-05 DIAGNOSIS — I495 Sick sinus syndrome: Secondary | ICD-10-CM | POA: Diagnosis not present

## 2015-09-05 NOTE — Patient Instructions (Signed)
Your physician recommends that you continue on your current medications as directed. Please refer to the Current Medication list given to you today. Your physician recommends that you return for lab work in: 3 months just before your next visit. Your physician recommends that you schedule a follow-up appointment in: 3 months.

## 2015-09-05 NOTE — Progress Notes (Signed)
Cardiology Office Note  Date: 09/05/2015   ID: [name], DOB Apr 17, 1929, MRN [id number]  PCP: [name], MD  Primary Cardiologist: Rozann Lesches, MD   Chief Complaint  Patient presents with  . Coronary Artery Disease  . Atrial arrhythmias  . History of cardiomyopathy    History of Present Illness: [name] is a medically complex 82 y.o. male last seen in December 2016. He presents with his wife today for a follow-up visit. At the last encounter Lasix was reduced to 80 mg in the morning and 40 mg in the afternoon with plan to increase dose based on worsening leg edema or weight change. He has done very well in terms of fluid management, his weight is down additional 8 pounds.  I reviewed his recent follow-up lab work showing improvement in TSH and also a creatinine of 1.1 with normal potassium. Results were discussed with the patient and his wife today.  He was admitted to [facility] in January with anemia, hemoglobin in the 7 range. He required 2 units of packed red cells and also iron infusion. Although reporting dark stools, he was Hemoccult-negative and has a pending outpatient GI evaluation for probable colonoscopy as a next step. Last procedure was 10 years ago. He has maintained Coumadin which he follows with his primary care provider.  He states that his energy is better, he has had no angina symptoms. No fevers or chills.  He continues to follow with Dr. Rayann Heman for pacemaker interrogation.  We reviewed his medications. He continues on amiodarone, Norvasc, Lotensin, Coreg, Lasix, Pravachol, and KCl supplements.  Past Medical History  Diagnosis Date  . Symptomatic bradycardia   . Atrial flutter (HCC)     Amiodarone therapy, coumadin therapy, prior history of cardioversion and overdrive pacing  . Cardiomyopathy     Mixed ischemic and nonischemic with previous EF of 35-40%, most recently in June 2009 normalized to 55-60%. Echo 2/11 EF 55-60%  . Coronary  atherosclerosis of native coronary artery     Status post coronary bypass grafting  . Valvular heart disease     Status post by prosthetic aortic valve and mitral valve repair  . Endocarditis     s/p PPM extraction by Dr Lovena Le 2012 endocarditis with enterococcus faecalis  . Paroxysmal atrial fibrillation (HCC)     Amiodarone therapy, status post Cox-Maze procedure but with recurrent atrial fibrillation while on amiodarone August 2012  . Carotid artery disease (HCC)     Less than 50% bilateral internal carotid artery stenosis by Doppler 2009  . Essential hypertension, benign   . Benign prostatic hyperplasia   . Carpal tunnel syndrome   . Warfarin anticoagulation   . Arthritis     Past Surgical History  Procedure Laterality Date  . Coronary artery bypass graft  2008    x2  . Total hip arthroplasty    . Knee arthroscopy    . Carpal tunnel release  2009  . Pacemaker insertion  2009    [facility]ially implanted at [facility], extracted by Dr Lovena Le for endocarditis and reimplanted 8/12  . Aortic valve replacement    . Tonsillectomy  1939  . Hernia repair  age 23  . Patella fracture surgery  age 59  . Cataract extraction, bilateral  07/2011    Current Outpatient Prescriptions  Medication Sig Dispense Refill  . ALOE VERA JUICE PO Take 2 oz by mouth daily.     Marland Kitchen amiodarone (PACERONE) 200 MG tablet Take 0.5  tablets (100 mg total) by mouth daily. 30 tablet 6  . amLODipine (NORVASC) 10 MG tablet Take 0.5 tablets (5 mg total) by mouth daily. 30 tablet 6  . benazepril (LOTENSIN) 40 MG tablet TAKE 1 TABLET BY MOUTH DAILY 30 tablet 6  . carvedilol (COREG) 3.125 MG tablet TAKE 1 TABLET (3.125 MG TOTAL) BY MOUTH 2 (TWO) TIMES DAILY. 60 tablet 3  . FIBER PO Take 1 packet by mouth daily. [employer] Brand fiber therapy    . furosemide (LASIX) 40 MG tablet Take 2 tabs (80mg ) by mouth every morning & 1 tab (40mg ) every evening, may take an extra 40mg  in the morning if needed.    . Iron-Folic Acid-Vit 123456  (IRON FORMULA PO) Take 1 tablet by mouth daily. RAW IRON 22MG  - WHOLE FOOD IRON, NON-CONSTIPATING    . KLOR-CON M20 20 MEQ tablet TAKE 1 TABLET BY MOUTH DAILY 30 tablet 5  . levothyroxine (SYNTHROID, LEVOTHROID) 50 MCG tablet Take 50 mcg by mouth daily before breakfast.    . Multiple Vitamins-Minerals (THEREMS M PO) Take 1 tablet by mouth daily.      . nitroGLYCERIN (NITROSTAT) 0.4 MG SL tablet Place 1 tablet (0.4 mg total) under the tongue every 5 (five) minutes as needed. Up to 3 doses, if no relief after 3rd dose, proceed to the ED for an evaluation. 25 tablet 3  . pravastatin (PRAVACHOL) 40 MG tablet TAKE 1 TABLET BY MOUTH EVERY EVENING 30 tablet 6  . Probiotic Product (PROBIOTIC DAILY PO) Take 1 tablet by mouth daily.    Marland Kitchen warfarin (COUMADIN) 2 MG tablet Take 1-2 mg by mouth daily. Take 2mg  every day except on mondays take 1mg      No current facility-administered medications for this visit.   Allergies:  Review of patient's allergies indicates no known allergies.   Social History: The patient  reports that he quit smoking about 61 years ago. His smoking use included Cigarettes. He started smoking about 71 years ago. He has a 2.4 pack-year smoking history. He has never used smokeless tobacco. He reports that he does not drink alcohol or use illicit drugs.   ROS:  Please see the history of present illness. Otherwise, complete review of systems is positive for decreased hearing, chronic but improved leg edema. Appetite is stable. All other systems are reviewed and negative.   Physical Exam: VS:  BP 128/68 mmHg  Pulse 60  Ht 5\' 6"  (1.676 m)  Wt 174 lb (78.926 kg)  BMI 28.10 kg/m2  SpO2 97%, BMI Body mass index is 28.1 kg/(m^2).  Wt Readings from Last 3 Encounters:  09/05/15 174 lb (78.926 kg)  08/04/15 181 lb 10.5 oz (82.4 kg)  07/02/15 182 lb (82.555 kg)    Chronically ill-appearing elderly male in no distress. Seated in wheelchair HEENT: Conjunctiva and lids normal, oropharynx  clear.  Neck: Supple, no elevated JVP, no thyromegaly.  Lungs: Clear to auscultation, nonlabored breathing at rest.  Cardiac: Regular rate and rhythm, no S3, 3/6 systolic murmur at base and apex, paradoxically split S2, no pericardial rub.  Abdomen: Soft, nontender, protuberent, bowel sounds present, no guarding or rebound.  Extremities: Bilateral compression hose in place with 1-2+ pitting edema, distal pulses 1-2+.  Skin: Warm and dry.  Musculoskeletal: No kyphosis.  Neuropsychiatric: Alert and oriented x3, affect grossly appropriate.  ECG: I personally reviewed his prior tracing from 05/22/2015 which showed dual-chamber pacing.  Recent Labwork: 08/01/2015: ALT 26; AST 32 08/03/2015: Hemoglobin 9.2*; Platelets 152 08/29/2015: BUN 24;  Creat 1.10; Potassium 4.1; Sodium 139; TSH 8.727*   Other Studies Reviewed Today:  Echocardiogram 09/06/2014: Study Conclusions  - Left ventricle: The cavity size was normal. There was mild concentric hypertrophy. Systolic function was normal. The estimated ejection fraction was in the range of 60% to 65%. Doppler parameters are consistent with restrictive physiology, indicative of decreased left ventricular diastolic compliance and/or increased left atrial pressure. Doppler parameters are consistent with high ventricular filling pressure. - Regional wall motion abnormality: Mild hypokinesis of the basal inferoseptal myocardium. - Aortic valve: Bioprosthetic aortic valve with mildly increased peak velocity and mean gradient. Peak velocity 3.41 m/s. There was no regurgitation. Mean gradient (S): 25 mm Hg. - Mitral valve: S/p mitral annuloplasty. Mild stenosis: mean gradient 6 mmHg. Mildly thickened leaflets . There was mild regurgitation. Valve area by pressure half-time: 1.63 cm^2. - Left atrium: The atrium was severely dilated. - Right ventricle: The cavity size was moderately dilated. Pacer wire or catheter noted in  right ventricle. Systolic function was mildly reduced. - Right atrium: The atrium was severely dilated. Pacer wire or catheter noted in right atrium. - Tricuspid valve: There was moderate regurgitation. Mildly elevated pulmonary pressures: 40 mmHg. - Pulmonic valve: There was mild regurgitation. - Systemic veins: IVC is dilated with normal respiratory variation. Estimated CVP 8 mmHg.  Assessment and Plan:  1. Significant improvement in volume status with preserved LVEF and diastolic heart failure. His weight is down further and he is tolerating the current diuretic dose with stable renal function. I did not make any specific changes in his cardiac medical regimen today.  2. Paroxysmal atrial fibrillation. He has done well on low-dose amiodarone and Coumadin.  3. Elevated TSH, improving. He is on Synthroid and also had a reduction in his amiodarone dose. He continues to follow with PCP.  4. CAD status post CABG, no active angina symptoms.  5. Status post bioprosthetic aortic valve replacement and mitral valve repair. Follow-up echocardiogram from last year showed stable valve function, mildly stenotic mitral valve. No change in current regimen.  6. Recently documented anemia, now status post PRBCs and iron therapy. He has pending evaluation by gastroenterology for further workup including colonoscopy. At this point he continues on Coumadin.  7. Sick sinus syndrome status post pacemaker placement, followed by Dr. Rayann Heman.  Current medicines were reviewed with the patient today.   Orders Placed This Encounter  Procedures  . Basic metabolic panel    Disposition: FU with me in 3 months.   Signed, Satira Sark, MD, [facility] 09/05/2015 12:48 PM    [facility] [place], [place], [place] [address] Phone: [phone]; Fax: [phone]

## 2015-09-06 ENCOUNTER — Encounter: Payer: Self-pay | Admitting: Cardiology

## 2015-10-03 ENCOUNTER — Encounter: Payer: Self-pay | Admitting: *Deleted

## 2015-10-08 ENCOUNTER — Ambulatory Visit (INDEPENDENT_AMBULATORY_CARE_PROVIDER_SITE_OTHER): Payer: Medicare Other | Admitting: Internal Medicine

## 2015-10-08 ENCOUNTER — Other Ambulatory Visit (INDEPENDENT_AMBULATORY_CARE_PROVIDER_SITE_OTHER): Payer: Medicare Other

## 2015-10-08 ENCOUNTER — Encounter: Payer: Self-pay | Admitting: Internal Medicine

## 2015-10-08 VITALS — BP 138/66 | HR 68 | Ht 66.0 in | Wt 170.0 lb

## 2015-10-08 DIAGNOSIS — D509 Iron deficiency anemia, unspecified: Secondary | ICD-10-CM | POA: Diagnosis not present

## 2015-10-08 DIAGNOSIS — Z7901 Long term (current) use of anticoagulants: Secondary | ICD-10-CM

## 2015-10-08 LAB — IBC PANEL
Iron: 33 ug/dL — ABNORMAL LOW (ref 42–165)
Saturation Ratios: 7 % — ABNORMAL LOW (ref 20.0–50.0)
Transferrin: 337 mg/dL (ref 212.0–360.0)

## 2015-10-08 LAB — CBC WITH DIFFERENTIAL/PLATELET
Basophils Absolute: 0 10*3/uL (ref 0.0–0.1)
Basophils Relative: 1 % (ref 0.0–3.0)
Eosinophils Absolute: 0.1 10*3/uL (ref 0.0–0.7)
Eosinophils Relative: 2.5 % (ref 0.0–5.0)
HCT: 30.1 % — ABNORMAL LOW (ref 39.0–52.0)
Hemoglobin: 9.6 g/dL — ABNORMAL LOW (ref 13.0–17.0)
Lymphocytes Relative: 17.1 % (ref 12.0–46.0)
Lymphs Abs: 0.8 10*3/uL (ref 0.7–4.0)
MCHC: 31.8 g/dL (ref 30.0–36.0)
MCV: 80.4 fl (ref 78.0–100.0)
Monocytes Absolute: 0.6 10*3/uL (ref 0.1–1.0)
Monocytes Relative: 11.7 % (ref 3.0–12.0)
Neutro Abs: 3.2 10*3/uL (ref 1.4–7.7)
Neutrophils Relative %: 67.7 % (ref 43.0–77.0)
Platelets: 162 10*3/uL (ref 150.0–400.0)
RBC: 3.74 Mil/uL — ABNORMAL LOW (ref 4.22–5.81)
RDW: 24 % — ABNORMAL HIGH (ref 11.5–15.5)
WBC: 4.8 10*3/uL (ref 4.0–10.5)

## 2015-10-08 LAB — FERRITIN: Ferritin: 30.2 ng/mL (ref 22.0–322.0)

## 2015-10-08 LAB — IGA: IgA: 264 mg/dL (ref 68–378)

## 2015-10-08 NOTE — Patient Instructions (Signed)
Your physician has requested that you go to the basement for the following lab work before leaving today: CBC, IBC, Ferritin, IGA, tissue transglutaminase, Hemocults  Please have your CBC completed every 3 months. You may have this done at your primary care and then faxed to our office at [phone].  We will request records from Dr Lindalou Hose.  We will decide on follow up based on lab findings.

## 2015-10-08 NOTE — Progress Notes (Signed)
HPI: [name] is an 82 yo male with complicated past medical history including but not limited to atrial fib/flutter on warfarin, CAD, history of endocarditis, peripheral basilar disease, chronic diastolic heart failure, who was hospitalized in January 2017 with iron deficiency anemia which was found to be heme negative. He is seen in consultation at the request of Mauricio Po, FNP for further evaluation. He's here today with his wife. During his hospital stay he did receive 2 units of packed red blood cells and IV iron infusion. He has been on oral iron since then. 2 separate FOBT tests were negative. Hemoglobin was discovered to be in the mid 7 range by primary care and improved to 9.2 after transfusion. Follow-up hemoglobin was 10 when checked after discharge by primary care.  He is now feeling much better and denies abdominal complaint. He has had issues off and on with constipation and he is using a probiotic and eating a high-fiber diet. He also eats several prunes daily. Energy levels have improved dramatically after transfusion. No overt bleeding. He denies melena. No rectal bleeding or blood in stool that he is aware of. He continues on warfarin daily. He denies abdominal pain, nausea, vomiting, heartburn or dysphagia. He has had multiple prior colonoscopies the last in 2010 performed by Dr. Leanora Ivanoff in [place]. He feels that this test was normal though I do not have a copy of it today.  Family history notable for a mother with cancer, this was possibly colon cancer though it appears he is unsure.  Past Medical History  Diagnosis Date  . Symptomatic bradycardia   . Atrial flutter (HCC)     Amiodarone therapy, coumadin therapy, prior history of cardioversion and overdrive pacing  . Cardiomyopathy     Mixed ischemic and nonischemic with previous EF of 35-40%, most recently in June 2009 normalized to 55-60%. Echo 2/11 EF 55-60%  . Coronary atherosclerosis of native coronary artery     Status  post coronary bypass grafting  . Valvular heart disease     Status post by prosthetic aortic valve and mitral valve repair  . Endocarditis     s/p PPM extraction by Dr Lovena Le 2012 endocarditis with enterococcus faecalis  . Paroxysmal atrial fibrillation (HCC)     Amiodarone therapy, status post Cox-Maze procedure but with recurrent atrial fibrillation while on amiodarone August 2012  . Carotid artery disease (HCC)     Less than 50% bilateral internal carotid artery stenosis by Doppler 2009  . Essential hypertension, benign   . Benign prostatic hyperplasia   . Carpal tunnel syndrome   . Warfarin anticoagulation   . Arthritis   . Anemia   . Obstructive sleep apnea   . Chronic diastolic (congestive) heart failure ([place])   . Endocarditis   . Hypothyroidism   . Iron deficiency anemia     Past Surgical History  Procedure Laterality Date  . Coronary artery bypass graft  2008    x2  . Total hip arthroplasty    . Knee arthroscopy    . Carpal tunnel release  2009  . Pacemaker insertion  2009    Initially implanted at [facility], extracted by Dr Lovena Le for endocarditis and reimplanted 8/12  . Aortic valve replacement    . Tonsillectomy  1939  . Hernia repair  age 12  . Patella fracture surgery  age 40  . Cataract extraction, bilateral  07/2011    Outpatient Prescriptions Prior to Visit  Medication Sig Dispense Refill  . ALOE VERA JUICE  PO Take 2 oz by mouth daily.     Marland Kitchen amiodarone (PACERONE) 200 MG tablet Take 0.5 tablets (100 mg total) by mouth daily. 30 tablet 6  . amLODipine (NORVASC) 10 MG tablet Take 0.5 tablets (5 mg total) by mouth daily. 30 tablet 6  . benazepril (LOTENSIN) 40 MG tablet TAKE 1 TABLET BY MOUTH DAILY 30 tablet 6  . carvedilol (COREG) 3.125 MG tablet TAKE 1 TABLET (3.125 MG TOTAL) BY MOUTH 2 (TWO) TIMES DAILY. 60 tablet 3  . FIBER PO Take 1 packet by mouth daily. Whole [employer] Brand fiber therapy    . furosemide (LASIX) 40 MG tablet Take 2 tabs (80mg ) by mouth every  morning & 1 tab (40mg ) every evening, may take an extra 40mg  in the morning if needed.    . Iron-Folic Acid-Vit 123456 (IRON FORMULA PO) Take 1 tablet by mouth daily. RAW IRON 22MG  - WHOLE FOOD IRON, NON-CONSTIPATING    . KLOR-CON M20 20 MEQ tablet TAKE 1 TABLET BY MOUTH DAILY 30 tablet 5  . levothyroxine (SYNTHROID, LEVOTHROID) 50 MCG tablet Take 50 mcg by mouth daily before breakfast.    . Multiple Vitamins-Minerals (THEREMS M PO) Take 1 tablet by mouth daily.      . nitroGLYCERIN (NITROSTAT) 0.4 MG SL tablet Place 1 tablet (0.4 mg total) under the tongue every 5 (five) minutes as needed. Up to 3 doses, if no relief after 3rd dose, proceed to the ED for an evaluation. 25 tablet 3  . pravastatin (PRAVACHOL) 40 MG tablet TAKE 1 TABLET BY MOUTH EVERY EVENING 30 tablet 6  . Probiotic Product (PROBIOTIC DAILY PO) Take 1 tablet by mouth daily.    Marland Kitchen warfarin (COUMADIN) 2 MG tablet Take 2 mg by mouth daily. Take 2mg  every day except on mondays take 1mg      No facility-administered medications prior to visit.    No Known Allergies  Family History  Problem Relation Age of Onset  . Diabetes Neg Hx   . Hypertension Neg Hx   . Coronary artery disease Father   . Colon cancer Mother   . Other Father     Heart attack or stroke  . Cancer Father     Social History  Substance Use Topics  . Smoking status: Former Smoker -- 0.30 packs/day for 8 years    Types: Cigarettes    Start date: 03/25/1944    Quit date: 07/27/1954  . Smokeless tobacco: Never Used  . Alcohol Use: 0.0 oz/week    0 Standard drinks or equivalent per week     Comment: glass of wine occ    ROS: As per history of present illness, otherwise negative  BP 138/66 mmHg  Pulse 68  Ht 5\' 6"  (1.676 m)  Wt 170 lb (77.111 kg)  BMI 27.45 kg/m2 Constitutional: Elderly appearing male in wheelchair in no acute distress  HEENT: Normocephalic and atraumatic.  Conjunctivae are normal.  No scleral icterus. Neck: Neck supple. Trachea  midline. Cardiovascular: Irregularly irregular  Pulmonary/chest: Effort normal and breath sounds normal. No wheezing, rales or rhonchi. Abdominal: Soft, nontender, nondistended. Bowel sounds active throughout. Extremities: no clubbing, cyanosis, or edema Neurological: Alert and oriented to person place and time. Skin: Skin is warm and dry.  Psychiatric: Normal mood and affect. Behavior is normal.  RELEVANT LABS AND IMAGING: CBC    Component Value Date/Time   WBC 4.8 10/08/2015 1443   RBC 3.74* 10/08/2015 1443   RBC 3.56* 08/01/2015 2322   HGB 9.6* 10/08/2015 1443  HCT 30.1* 10/08/2015 1443   PLT 162.0 10/08/2015 1443   MCV 80.4 10/08/2015 1443   MCH 21.5* 08/03/2015 1810   MCHC 31.8 10/08/2015 1443   RDW 24.0* 10/08/2015 1443   LYMPHSABS 0.8 10/08/2015 1443   MONOABS 0.6 10/08/2015 1443   EOSABS 0.1 10/08/2015 1443   BASOSABS 0.0 10/08/2015 1443    CMP     Component Value Date/Time   NA 139 08/29/2015 1350   K 4.1 08/29/2015 1350   CL 102 08/29/2015 1350   CO2 29 08/29/2015 1350   GLUCOSE 123* 08/29/2015 1350   BUN 24 08/29/2015 1350   CREATININE 1.10 08/29/2015 1350   CREATININE 1.36* 08/03/2015 1810   CALCIUM 9.2 08/29/2015 1350   PROT 6.8 08/01/2015 1626   ALBUMIN 3.2* 08/01/2015 1626   AST 32 08/01/2015 1626   ALT 26 08/01/2015 1626   ALKPHOS 112 08/01/2015 1626   BILITOT 1.0 08/01/2015 1626   GFRNONAA 46* 08/03/2015 1810   GFRAA 53* 08/03/2015 1810   Iron/TIBC/Ferritin/ %Sat    Component Value Date/Time   IRON 33* 10/08/2015 1443   TIBC 489* 08/01/2015 2322   FERRITIN 30.2 10/08/2015 1443   IRONPCTSAT 7.0* 10/08/2015 1443    ASSESSMENT/PLAN: 82 yo male with complicated past medical history including but not limited to atrial fib/flutter on warfarin, CAD, history of endocarditis, peripheral basilar disease, chronic diastolic heart failure, who was hospitalized in January 2017 with iron deficiency anemia which was found to be heme negative referred for  further evaluation.  1. IDA -- interestingly his stools are heme negative on 2 occasions. He clearly had iron deficiency anemia and received IV iron. Repeat labs today show hemoglobin has remained stable but has not increased dramatically. His iron remains low. I feel that it is appropriate to repeat an IV iron infusion and I will schedule this for him. We discussed further evaluation of iron deficiency anemia which also includes upper endoscopy and colonoscopy. He remains somewhat hesitant for these tests due to the overall risk given his medical comorbidities. This is understanding. I recommended repeating FOBT testing 6 at home using separate bowel movements. If any microscopic blood is found on these test my recommendation is for upper endoscopy and colonoscopy. He agrees with this recommendation and is comfortable with the plan. Request for prior colonoscopies has been made today. Check celiac panel as well. Continue oral iron daily. If procedures are indicated depending on further testing, we would need permission to hold warfarin prior to endoscopies. I recommended that we check CBC at least every 3 months, which can be done at home in [place] and faxed to my office.    OB:[id number] [address], [place] [address]

## 2015-10-09 ENCOUNTER — Telehealth: Payer: Self-pay | Admitting: *Deleted

## 2015-10-09 ENCOUNTER — Other Ambulatory Visit: Payer: Self-pay | Admitting: Internal Medicine

## 2015-10-09 DIAGNOSIS — D509 Iron deficiency anemia, unspecified: Secondary | ICD-10-CM

## 2015-10-09 LAB — TISSUE TRANSGLUTAMINASE, IGA: Tissue Transglutaminase Ab, IgA: 1 U/mL (ref <4)

## 2015-10-09 NOTE — Telephone Encounter (Signed)
Dr Hilarie Fredrickson has given the verbal authorization for patient to have fereheme. I have spoken with Laverne @ [facility] Stay and have scheduled the patient's first fereheme injection for 10/14/15 @ 11 am with 10:30 am arrival. Otilio Saber states that she will let patient schedule his 2nd fereheme when he comes on 10/14/15. Orders have been placed in EPIC. I have spoken to Mr. Marchitto to advise him of improved hemoglobin although still low. He is also advised of ferehem injection time/date/location and he verbalizes understanding.

## 2015-10-09 NOTE — Telephone Encounter (Signed)
-----   Message from Jerene Bears, MD sent at 10/09/2015 10:42 AM EDT ----- Hemoglobin is slightly improved from hospital discharge and iron remains low despite oral iron Needs IV iron per pharmacy Wants to have this here rather than [place]

## 2015-10-14 ENCOUNTER — Ambulatory Visit (HOSPITAL_COMMUNITY)
Admission: RE | Admit: 2015-10-14 | Discharge: 2015-10-14 | Disposition: A | Discharge status: Home / Self Care | Payer: Medicare Other | Source: Ambulatory Visit | Attending: Internal Medicine | Admitting: Internal Medicine

## 2015-10-14 VITALS — BP 132/65 | HR 59 | Temp 97.7°F | Resp 20 | Ht 66.0 in | Wt 170.0 lb

## 2015-10-14 DIAGNOSIS — D509 Iron deficiency anemia, unspecified: Secondary | ICD-10-CM | POA: Insufficient documentation

## 2015-10-14 MED ORDER — SODIUM CHLORIDE 0.9 % IV SOLN
510.0000 mg | Freq: Once | INTRAVENOUS | Status: AC
  Administered 2015-10-14: 510 mg via INTRAVENOUS
  Filled 2015-10-14: qty 17

## 2015-10-14 MED ORDER — FERUMOXYTOL INJECTION 510 MG/17 ML: 510.0000 mg | INTRAVENOUS | Status: DC

## 2015-10-15 ENCOUNTER — Encounter: Payer: Self-pay | Admitting: Internal Medicine

## 2015-10-17 ENCOUNTER — Other Ambulatory Visit (INDEPENDENT_AMBULATORY_CARE_PROVIDER_SITE_OTHER): Payer: Medicare Other

## 2015-10-17 ENCOUNTER — Ambulatory Visit (HOSPITAL_COMMUNITY)
Admission: RE | Admit: 2015-10-17 | Discharge: 2015-10-17 | Disposition: A | Discharge status: Home / Self Care | Payer: Medicare Other | Source: Ambulatory Visit | Attending: Internal Medicine | Admitting: Internal Medicine

## 2015-10-17 DIAGNOSIS — D509 Iron deficiency anemia, unspecified: Secondary | ICD-10-CM | POA: Diagnosis present

## 2015-10-17 LAB — HEMOCCULT SLIDES (X 3 CARDS)
Fecal Occult Blood: NEGATIVE
Fecal Occult Blood: POSITIVE — AB
OCCULT 1: NEGATIVE
OCCULT 1: POSITIVE — AB
OCCULT 2: NEGATIVE
OCCULT 2: POSITIVE — AB
OCCULT 3: NEGATIVE
OCCULT 3: NEGATIVE
OCCULT 4: NEGATIVE
OCCULT 4: NEGATIVE
OCCULT 5: NEGATIVE
OCCULT 5: POSITIVE — AB

## 2015-10-17 MED ORDER — SODIUM CHLORIDE 0.9 % IV SOLN
510.0000 mg | Freq: Once | INTRAVENOUS | Status: AC
  Administered 2015-10-17: 510 mg via INTRAVENOUS
  Filled 2015-10-17: qty 17

## 2015-10-18 ENCOUNTER — Telehealth: Payer: Self-pay

## 2015-10-18 NOTE — Telephone Encounter (Signed)
-----   Message from Jerene Bears, MD sent at 10/18/2015  9:02 AM EDT ----- Regarding: FW: [facility]? [place] for [employer] per JN  ----- Message -----    From: Osvaldo Angst, CRNA    Sent: 10/18/2015   1:24 AM      To: Jerene Bears, MD Subject: RE: [facility]?                                       Doc,  His EF is great.  He has a dual chamber pacer so I think he is ok.  John  ----- Message -----    From: Jerene Bears, MD    Sent: 10/17/2015   2:40 PM      To: Osvaldo Angst, CRNA, Algernon Huxley, RN Subject: [place]?                                           From your perspective, is this pt okay for [place] endo/colon? Thanks [employer]

## 2015-10-18 NOTE — Telephone Encounter (Signed)
Pt scheduled for previsit 10/25/15@1pm , ECL scheduled in the [facility] 11/12/15@3 :30pm. Note sent to Dr. Domenic Polite regarding holding the Coumadin, ?Lovenox.

## 2015-10-22 NOTE — Patient Instructions (Signed)
Wife notified.  Pt will need to hold coumadin 5 days prior to procedure and resume night of procedure if OK with MD.  Does not need Lovenox bridge per protocol.  Pt to f/u with PCP 3-4 days after resuming coumadin for INR check.  She verbalized understanding.  More detailed instructions per GI Office.

## 2015-10-22 NOTE — Telephone Encounter (Signed)
Coumadin instructions received from Dr. Domenic Polite.

## 2015-10-25 ENCOUNTER — Ambulatory Visit (AMBULATORY_SURGERY_CENTER): Payer: Self-pay | Admitting: *Deleted

## 2015-10-25 VITALS — Ht 66.0 in | Wt 170.0 lb

## 2015-10-25 DIAGNOSIS — D509 Iron deficiency anemia, unspecified: Secondary | ICD-10-CM

## 2015-10-25 MED ORDER — NA SULFATE-K SULFATE-MG SULF 17.5-3.13-1.6 GM/177ML PO SOLN: ORAL | Status: DC

## 2015-10-25 NOTE — Progress Notes (Signed)
No egg or soy allergy  No anesthesia or intubation problems per pt  No diet medications taken   

## 2015-11-09 ENCOUNTER — Other Ambulatory Visit: Payer: Self-pay | Admitting: Cardiology

## 2015-11-12 ENCOUNTER — Other Ambulatory Visit: Payer: Self-pay | Admitting: Internal Medicine

## 2015-11-12 ENCOUNTER — Encounter: Payer: Self-pay | Admitting: Internal Medicine

## 2015-11-12 ENCOUNTER — Ambulatory Visit (AMBULATORY_SURGERY_CENTER): Payer: Medicare Other | Admitting: Internal Medicine

## 2015-11-12 VITALS — BP 143/66 | HR 60 | Temp 99.3°F | Resp 17 | Ht 66.0 in | Wt 170.0 lb

## 2015-11-12 DIAGNOSIS — D509 Iron deficiency anemia, unspecified: Secondary | ICD-10-CM

## 2015-11-12 DIAGNOSIS — Z800 Family history of malignant neoplasm of digestive organs: Secondary | ICD-10-CM

## 2015-11-12 DIAGNOSIS — R195 Other fecal abnormalities: Secondary | ICD-10-CM | POA: Diagnosis not present

## 2015-11-12 DIAGNOSIS — D120 Benign neoplasm of cecum: Secondary | ICD-10-CM

## 2015-11-12 DIAGNOSIS — K2970 Gastritis, unspecified, without bleeding: Secondary | ICD-10-CM

## 2015-11-12 DIAGNOSIS — D122 Benign neoplasm of ascending colon: Secondary | ICD-10-CM

## 2015-11-12 DIAGNOSIS — K2270 Barrett's esophagus without dysplasia: Secondary | ICD-10-CM

## 2015-11-12 MED ORDER — SODIUM CHLORIDE 0.9 % IV SOLN: 500.0000 mL | INTRAVENOUS | Status: DC

## 2015-11-12 MED ORDER — PANTOPRAZOLE SODIUM 40 MG PO TBEC: 40.0000 mg | DELAYED_RELEASE_TABLET | Freq: Every day | ORAL | Status: DC

## 2015-11-12 NOTE — Patient Instructions (Addendum)
YOU HAD AN ENDOSCOPIC PROCEDURE TODAY AT THE [facility]:   Refer to the procedure report that was given to you for any specific questions about what was found during the examination.  If the procedure report does not answer your questions, please call your gastroenterologist to clarify.  If you requested that your care partner not be given the details of your procedure findings, then the procedure report has been included in a sealed envelope for you to review at your convenience later.  YOU SHOULD EXPECT: Some feelings of bloating in the abdomen. Passage of more gas than usual.  Walking can help get rid of the air that was put into your GI tract during the procedure and reduce the bloating. If you had a lower endoscopy (such as a colonoscopy or flexible sigmoidoscopy) you may notice spotting of blood in your stool or on the toilet paper. If you underwent a bowel prep for your procedure, you may not have a normal bowel movement for a few days.  Please Note:  You might notice some irritation and congestion in your nose or some drainage.  This is from the oxygen used during your procedure.  There is no need for concern and it should clear up in a day or so.  SYMPTOMS TO REPORT IMMEDIATELY:   Following lower endoscopy (colonoscopy or flexible sigmoidoscopy):  Excessive amounts of blood in the stool  Significant tenderness or worsening of abdominal pains  Swelling of the abdomen that is new, acute  Fever of 100F or higher   Following upper endoscopy (EGD)  Vomiting of blood or coffee ground material  New chest pain or pain under the shoulder blades  Painful or persistently difficult swallowing  New shortness of breath  Fever of 100F or higher  Black, tarry-looking stools  For urgent or emergent issues, a gastroenterologist can be reached at any hour by calling [phone].   DIET: Your first meal following the procedure should be a small meal and then it is ok to progress to  your normal diet. Heavy or fried foods are harder to digest and may make you feel nauseous or bloated.  Likewise, meals heavy in dairy and vegetables can increase bloating.  Drink plenty of fluids but you should avoid alcoholic beverages for 24 hours.  ACTIVITY:  You should plan to take it easy for the rest of today and you should NOT DRIVE or use heavy machinery until tomorrow (because of the sedation medicines used during the test).    FOLLOW UP: Our staff will call the number listed on your records the next business day following your procedure to check on you and address any questions or concerns that you may have regarding the information given to you following your procedure. If we do not reach you, we will leave a message.  However, if you are feeling well and you are not experiencing any problems, there is no need to return our call.  We will assume that you have returned to your regular daily activities without incident.  If any biopsies were taken you will be contacted by phone or by letter within the next 1-3 weeks.  Please call us at [phone] if you have not heard about the biopsies in 3 weeks.    SIGNATURES/CONFIDENTIALITY: You and/or your care partner have signed paperwork which will be entered into your electronic medical record.  These signatures attest to the fact that that the information above on your After Visit Summary has been reviewed   and is understood.  Full responsibility of the confidentiality of this discharge information lies with you and/or your care-partner.  RESUME COUMADIN AT PRIOR DOSE IN 5 DAYS-REFER TO MANAGING PHYSICIAN FOR FURTHER ADJUSTMENT  PROTONIX 40 MG DAILY-RX SENT ELECTRONICALLY TO PHARMACY IN [place] [facility]  AWAIT PATHOLOGY RESULTS ON POLYPS REMOVED FROM COLON AND UPPER ENDOSCOPY BIOPSIES  INFORMATION ON POLYPS GIVEN TO YOU TODAY  RETURN TO GI OFFICE AT NEXT AVAILABLE APPOINTMENT -MAKE THIS APPOINTMENT

## 2015-11-12 NOTE — Op Note (Signed)
[employer] Patient Name: [name] Procedure Date: 11/12/2015 3:30 PM MRN: [id number] Endoscopist: Jerene Bears , MD Age: 82 Date of Birth: 01-Sep-1928 Gender: Male Procedure:                Upper GI endoscopy Indications:              Iron deficiency anemia, Heme positive stool Medicines:                Monitored Anesthesia Care Procedure:                Pre-Anesthesia Assessment:                           - Prior to the procedure, a History and Physical                            was performed, and patient medications and                            allergies were reviewed. The patient's tolerance of                            previous anesthesia was also reviewed. The risks                            and benefits of the procedure and the sedation                            options and risks were discussed with the patient.                            All questions were answered, and informed consent                            was obtained. Prior Anticoagulants: The patient has                            taken Coumadin (warfarin), last dose was 5 days                            prior to procedure. ASA Grade Assessment: III - A                            patient with severe systemic disease. After                            reviewing the risks and benefits, the patient was                            deemed in satisfactory condition to undergo the                            procedure.  After obtaining informed consent, the endoscope was                            passed under direct vision. Throughout the                            procedure, the patient's blood pressure, pulse, and                            oxygen saturations were monitored continuously. The                            Model GIF-HQ190 [id number]) scope was introduced                            through the mouth, and advanced to the third part                            of duodenum.  The upper GI endoscopy was                            accomplished without difficulty. The patient                            tolerated the procedure well. Scope In: Scope Out: Findings:                 The esophagus and gastroesophageal junction were                            examined with white light and narrow band imaging                            (NBI) from a forward view and retroflexed position.                            There were esophageal mucosal changes suggestive of                            long-segment Barrett's esophagus. These changes                            involved the mucosa at the upper extent of the                            gastric folds (40 cm from the incisors) extending                            to the Z-line (36 cm from the incisors). Two                            tongues of salmon-colored mucosa were present from  36 to 38 cm. The maximum longitudinal extent of                            these esophageal mucosal changes was 4 cm in                            length. Mucosa was biopsied with a cold forceps for                            histology in 4 quadrants at intervals of 2 cm in                            the lower third of the esophagus (37 cm and 39 cm).                            A total of 2 specimen bottles were sent to                            pathology.                           A 3 cm hiatal hernia was present, diaphragmatic                            hiatus at 43 cm.                           The entire examined stomach was normal.                           The examined duodenum was normal. Biopsies for                            histology were taken with a cold forceps for                            evaluation of celiac disease. Complications:            No immediate complications. Estimated Blood Loss:     Estimated blood loss was minimal. Impression:               - Esophageal mucosal changes suggestive of                             long-segment Barrett's esophagus. Biopsied.                           - 3 cm hiatal hernia.                           - Normal stomach.                           - Normal examined duodenum. Biopsied. Recommendation:           - Patient has  a contact number available for                            emergencies. The signs and symptoms of potential                            delayed complications were discussed with the                            patient. Return to normal activities tomorrow.                            Written discharge instructions were provided to the                            patient.                           - Resume previous diet.                           - Resume Coumadin (warfarin) at prior dose in 5                            days. Refer to managing physician for further                            adjustment of therapy.                           - Use Protonix (pantoprazole) 40 mg PO daily.                           - Await pathology results.                           - Perform a colonoscopy. Jerene Bears, MD 11/12/2015 4:46:09 PM This report has been signed electronically.

## 2015-11-12 NOTE — Op Note (Signed)
[facility] Patient Name: [name] Procedure Date: 11/12/2015 3:55 PM MRN: [id number] Endoscopist: Jerene Bears , MD Age: 82 Date of Birth: 03-15-1929 Gender: Male Procedure:                Colonoscopy Indications:              Family history of advanced adenoma of the colon in                            a first-degree relative, Heme positive stool, Iron                            deficiency anemia Medicines:                Monitored Anesthesia Care Procedure:                Pre-Anesthesia Assessment:                           - Prior to the procedure, a History and Physical                            was performed, and patient medications and                            allergies were reviewed. The patient's tolerance of                            previous anesthesia was also reviewed. The risks                            and benefits of the procedure and the sedation                            options and risks were discussed with the patient.                            All questions were answered, and informed consent                            was obtained. Prior Anticoagulants: The patient has                            taken Coumadin (warfarin), last dose was 5 days                            prior to procedure. ASA Grade Assessment: III - A                            patient with severe systemic disease. After                            reviewing the risks and benefits, the patient was  deemed in satisfactory condition to undergo the                            procedure.                           - Prior to the procedure, a History and Physical                            was performed, and patient medications and                            allergies were reviewed. The patient's tolerance of                            previous anesthesia was also reviewed. The risks                            and benefits of the procedure and the sedation                           options and risks were discussed with the patient.                            All questions were answered, and informed consent                            was obtained. Prior Anticoagulants: The patient has                            taken Coumadin (warfarin), last dose was 5 days                            prior to procedure. ASA Grade Assessment: III - A                            patient with severe systemic disease. After                            reviewing the risks and benefits, the patient was                            deemed in satisfactory condition to undergo the                            procedure.                           After obtaining informed consent, the colonoscope                            was passed under direct vision. Throughout the  procedure, the patient's blood pressure, pulse, and                            oxygen saturations were monitored continuously. The                            Model CF-HQ190L [id number]) scope was introduced                            through the anus and advanced to the the cecum,                            identified by appendiceal orifice and ileocecal                            valve. The colonoscopy was performed without                            difficulty. The patient tolerated the procedure                            well. The quality of the bowel preparation was                            good. The ileocecal valve, appendiceal orifice, and                            rectum were photographed. Scope In: 3:56:33 PM Scope Out: 4:19:48 PM Scope Withdrawal Time: 0 hours 20 minutes 38 seconds  Total Procedure Duration: 0 hours 23 minutes 15 seconds  Findings:                 Two sessile polyps were found in the cecum. The                            polyps were 4 to 5 mm in size. These polyps were                            removed with a cold snare. Resection and retrieval                             were complete.                           Two sessile polyps were found in the ascending                            colon. The polyps were 4 to 5 mm in size. These                            polyps were removed with a cold snare. Resection                            and retrieval  were complete.                           A 14 mm polyp was found in the ascending colon. The                            polyp was sessile. The polyp was removed with a hot                            snare. Resection and retrieval were complete. To                            prevent bleeding after the polypectomy, one                            hemostatic clip was successfully placed.                           Internal hemorrhoids were found during                            retroflexion. The hemorrhoids were medium-sized.                           There is no endoscopic evidence of bleeding,                            inflammation or angiodysplasia in the entire colon. Complications:            No immediate complications. Estimated Blood Loss:     Estimated blood loss was minimal. Impression:               - Two 4 to 5 mm polyps in the cecum, removed with a                            cold snare. Resected and retrieved.                           - Two 4 to 5 mm polyps in the ascending colon,                            removed with a cold snare. Resected and retrieved.                           - One 14 mm polyp in the ascending colon, removed                            with a hot snare. Resected and retrieved. Clip was                            placed.                           - Internal hemorrhoids. Recommendation:           -  Patient has a contact number available for                            emergencies. The signs and symptoms of potential                            delayed complications were discussed with the                            patient. Return to normal activities tomorrow.                             Written discharge instructions were provided to the                            patient.                           - Resume previous diet.                           - Resume Coumadin (warfarin) at prior dose in 5                            days. Refer to managing physician for further                            adjustment of therapy.                           - Await pathology results.                           - No aspirin, ibuprofen, naproxen, or other                            non-steroidal anti-inflammatory drugs for 2 weeks                            after polyp removal.                           - Closely monitor Hgb and iron studies. Repeat IV                            iron replacement if necessary. Consider video                            capsule endoscopy if iron studies and hemoglobin                            fail to improve after iron replacement.                           - No repeat surveillance colonoscopy due to age.                           -  Return to GI office at the next available                            appointment. Jerene Bears, MD 11/12/2015 4:54:55 PM This report has been signed electronically.

## 2015-11-12 NOTE — Progress Notes (Signed)
Called to room to assist during endoscopic procedure.  Patient ID and intended procedure confirmed with present staff. Received instructions for my participation in the procedure from the performing physician.  

## 2015-11-12 NOTE — Progress Notes (Signed)
To recovery, report to [place], RN, VSS 

## 2015-11-12 NOTE — Progress Notes (Addendum)
No egg or soy allergy known to patient  No issues with past sedation with any surgeries  or procedures, no intubation problems  No diet pills per patient No home 02 use per patient  Takes  blood thinners per patient -on coumsdin has held  5 days as directed- last dose 11-07-15 as directed  Pt denies issues with constipation chronic- has occ issues -on probiotic  Pt has hx aflutter  on warfarin, he has a Psychologist, forensic

## 2015-11-13 ENCOUNTER — Telehealth: Payer: Self-pay | Admitting: *Deleted

## 2015-11-13 NOTE — Telephone Encounter (Signed)
  Follow up Call-  Call back number 11/12/2015  Post procedure Call Back phone  # [phone]  Permission to leave phone message Yes     Patient questions:  Do you have a fever, pain , or abdominal swelling? No. Pain Score  0 *  Have you tolerated food without any problems? Yes.    Have you been able to return to your normal activities? Yes.    Do you have any questions about your discharge instructions: Diet   No. Medications  No. Follow up visit  No.  Do you have questions or concerns about your Care? No.  Actions: * If pain score is 4 or above: No action needed, pain <4.  Information provided per wife.

## 2015-11-18 ENCOUNTER — Encounter: Payer: Self-pay | Admitting: Internal Medicine

## 2015-11-19 ENCOUNTER — Other Ambulatory Visit: Payer: Self-pay | Admitting: Cardiology

## 2015-11-19 MED ORDER — FUROSEMIDE 40 MG PO TABS: ORAL_TABLET | ORAL | Status: DC

## 2015-11-20 ENCOUNTER — Ambulatory Visit (INDEPENDENT_AMBULATORY_CARE_PROVIDER_SITE_OTHER): Payer: Medicare Other | Admitting: *Deleted

## 2015-11-20 ENCOUNTER — Telehealth: Payer: Self-pay | Admitting: Cardiology

## 2015-11-20 DIAGNOSIS — I495 Sick sinus syndrome: Secondary | ICD-10-CM

## 2015-11-20 NOTE — Progress Notes (Signed)
Remote pacemaker transmission.   

## 2015-11-20 NOTE — Telephone Encounter (Signed)
Spoke with pt and reminded pt of remote transmission that is due today. Pt verbalized understanding.   

## 2015-11-28 ENCOUNTER — Telehealth: Payer: Self-pay | Admitting: *Deleted

## 2015-11-28 DIAGNOSIS — D649 Anemia, unspecified: Secondary | ICD-10-CM

## 2015-11-28 NOTE — Telephone Encounter (Signed)
Dr Hilarie Fredrickson has received patient's CBC, ferritin and IBC panel from 11/26/15. He indicates "hemoglobin better, not yet normal. Iron stores better. Continue iron replacement. Repeat labs in 8 weeks." I have spoken to patient to advise of this information. He verbalizes understanding. I have sent orders to his home address for him to get repeat labs in 8 weeks.

## 2015-12-01 ENCOUNTER — Other Ambulatory Visit: Payer: Self-pay | Admitting: Cardiology

## 2015-12-03 ENCOUNTER — Ambulatory Visit (INDEPENDENT_AMBULATORY_CARE_PROVIDER_SITE_OTHER): Payer: Medicare Other | Admitting: Cardiology

## 2015-12-03 ENCOUNTER — Encounter: Payer: Self-pay | Admitting: Cardiology

## 2015-12-03 ENCOUNTER — Encounter: Payer: Self-pay | Admitting: Internal Medicine

## 2015-12-03 VITALS — BP 138/62 | HR 60 | Ht 66.0 in | Wt 174.0 lb

## 2015-12-03 DIAGNOSIS — I5032 Chronic diastolic (congestive) heart failure: Secondary | ICD-10-CM

## 2015-12-03 DIAGNOSIS — I38 Endocarditis, valve unspecified: Secondary | ICD-10-CM | POA: Diagnosis not present

## 2015-12-03 DIAGNOSIS — Z950 Presence of cardiac pacemaker: Secondary | ICD-10-CM

## 2015-12-03 DIAGNOSIS — I480 Paroxysmal atrial fibrillation: Secondary | ICD-10-CM | POA: Diagnosis not present

## 2015-12-03 DIAGNOSIS — I2510 Atherosclerotic heart disease of native coronary artery without angina pectoris: Secondary | ICD-10-CM

## 2015-12-03 DIAGNOSIS — I495 Sick sinus syndrome: Secondary | ICD-10-CM

## 2015-12-03 NOTE — Progress Notes (Signed)
Cardiology Office Note  Date: 12/03/2015   ID: De Hollingshead, DOB 10-31-1928, MRN [id number]  PCP: Eber Hong, MD  Primary Cardiologist: Rozann Lesches, MD   Chief Complaint  Patient presents with  . Coronary Artery Disease  . Atrial arrhythmias  . Cardiomyopathy    History of Present Illness: [name] is a medically complex 82 y.o. male last seen in February. He presents with his wife for a follow-up visit. Overall doing reasonably well with stable NYHA class II dyspnea and no significant weight gain on present diuretic regimen.  He continues on Coumadin, followed by primary care. No reported bleeding problems.  Interval GI evaluation noted by Dr. Hilarie Fredrickson in March, I reviewed the note. He has had EGD and colonoscopy, also received iron transfusions. Hemoglobin has been trending up but is not yet back to normal. He does feel better.  Due for follow-up BMET, he will be getting this done through Dr. Jerene Dilling office  Past Medical History  Diagnosis Date  . Symptomatic bradycardia   . Atrial flutter (HCC)     Amiodarone therapy, coumadin therapy, prior history of cardioversion and overdrive pacing  . Cardiomyopathy     Mixed ischemic and nonischemic with previous EF of 35-40%, most recently in June 2009 normalized to 55-60%. Echo 2/11 EF 55-60%  . Coronary atherosclerosis of native coronary artery     Status post coronary bypass grafting  . Valvular heart disease     Status post by prosthetic aortic valve and mitral valve repair  . Endocarditis     s/p PPM extraction by Dr Lovena Le 2012 endocarditis with enterococcus faecalis  . Paroxysmal atrial fibrillation (HCC)     Amiodarone therapy, status post Cox-Maze procedure but with recurrent atrial fibrillation while on amiodarone August 2012  . Carotid artery disease (HCC)     Less than 50% bilateral internal carotid artery stenosis by Doppler 2009  . Essential hypertension, benign   . Benign prostatic hyperplasia   . Carpal  tunnel syndrome   . Warfarin anticoagulation   . Arthritis   . Anemia   . Obstructive sleep apnea   . Chronic diastolic (congestive) heart failure ([place])   . Endocarditis   . Hypothyroidism   . Iron deficiency anemia   . Hyperlipidemia   . Osteoporosis     Past Surgical History  Procedure Laterality Date  . Coronary artery bypass graft  2008    x2  . Total hip arthroplasty    . Knee arthroscopy    . Carpal tunnel release  2009  . Pacemaker insertion  2009    [facility]ially implanted at [facility], extracted by Dr Lovena Le for endocarditis and reimplanted 8/12  . Aortic valve replacement    . Tonsillectomy  1939  . Hernia repair  age 17  . Patella fracture surgery  age 37    screw placed and removed  . Cataract extraction, bilateral  07/2011    Current Outpatient Prescriptions  Medication Sig Dispense Refill  . ALOE VERA JUICE PO Take 2 oz by mouth daily.     Marland Kitchen amiodarone (PACERONE) 200 MG tablet Take 0.5 tablets (100 mg total) by mouth daily. 30 tablet 6  . amLODipine (NORVASC) 10 MG tablet Take 0.5 tablets (5 mg total) by mouth daily. 30 tablet 6  . benazepril (LOTENSIN) 40 MG tablet TAKE 1 TABLET BY MOUTH DAILY 30 tablet 6  . carvedilol (COREG) 3.125 MG tablet TAKE 1 TABLET (3.125 MG TOTAL) BY MOUTH 2 (TWO) TIMES DAILY. McDuffie  tablet 6  . FIBER PO Take 1 packet by mouth daily. [employer] Brand fiber therapy    . furosemide (LASIX) 40 MG tablet Take 2 tabs (80mg ) by mouth every morning & 1 tab (40mg ) every evening, may take an extra 40mg  in the morning if needed. 90 tablet 1  . Iron-Folic Acid-Vit 123456 (IRON FORMULA PO) Take 1 tablet by mouth daily. RAW IRON 22MG  - WHOLE FOOD IRON, NON-CONSTIPATING    . KLOR-CON M20 20 MEQ tablet TAKE 1 TABLET BY MOUTH DAILY 30 tablet 5  . levothyroxine (SYNTHROID, LEVOTHROID) 50 MCG tablet Take 50 mcg by mouth daily before breakfast.    . Multiple Vitamins-Minerals (THEREMS M PO) Take 1 tablet by mouth daily.      . nitroGLYCERIN (NITROSTAT) 0.4 MG SL  tablet Place 1 tablet (0.4 mg total) under the tongue every 5 (five) minutes as needed. Up to 3 doses, if no relief after 3rd dose, proceed to the ED for an evaluation. 25 tablet 3  . pantoprazole (PROTONIX) 40 MG tablet Take 1 tablet (40 mg total) by mouth daily. 90 tablet 3  . pravastatin (PRAVACHOL) 40 MG tablet TAKE 1 TABLET BY MOUTH EVERY EVENING 30 tablet 6  . Probiotic Product (PROBIOTIC DAILY PO) Take 1 tablet by mouth daily.    Marland Kitchen warfarin (COUMADIN) 2 MG tablet Take 2 mg by mouth daily. Take 2mg  every day except on mondays take 1mg      No current facility-administered medications for this visit.   Allergies:  Review of patient's allergies indicates no known allergies.   Social History: The patient  reports that he quit smoking about 61 years ago. His smoking use included Cigarettes. He started smoking about 71 years ago. He has a 2.4 pack-year smoking history. He has never used smokeless tobacco. He reports that he drinks alcohol. He reports that he does not use illicit drugs.   ROS:  Please see the history of present illness. Otherwise, complete review of systems is positive for decreased hearing, improved leg edema.  All other systems are reviewed and negative.   Physical Exam: VS:  BP 138/62 mmHg  Pulse 60  Ht 5\' 6"  (1.676 m)  Wt 174 lb (78.926 kg)  BMI 28.10 kg/m2  SpO2 92%, BMI Body mass index is 28.1 kg/(m^2).  Wt Readings from Last 3 Encounters:  12/03/15 174 lb (78.926 kg)  11/12/15 170 lb (77.111 kg)  10/25/15 170 lb (77.111 kg)    Chronically ill-appearing elderly male in no distress. Using walker today. HEENT: Conjunctiva and lids normal, oropharynx clear.  Neck: Supple, no elevated JVP, no thyromegaly.  Lungs: Clear to auscultation, nonlabored breathing at rest.  Cardiac: Regular rate and rhythm, no S3, 3/6 systolic murmur at base and apex, paradoxically split S2, no pericardial rub.  Abdomen: Soft, nontender, protuberent, bowel sounds present, no guarding or  rebound.  Extremities: Bilateral compression hose in place with 1+ pitting edema, distal pulses 1-2+.   ECG: I personally reviewed the prior tracing from 05/22/2015 which showed dual-chamber pacing.  Recent Labwork: 08/01/2015: ALT 26; AST 32 08/29/2015: BUN 24; Creat 1.10; Potassium 4.1; Sodium 139; TSH 8.727* 10/08/2015: Hemoglobin 9.6*; Platelets 162.0   Other Studies Reviewed Today:  Echocardiogram 09/06/2014: Study Conclusions  - Left ventricle: The cavity size was normal. There was mild concentric hypertrophy. Systolic function was normal. The estimated ejection fraction was in the range of 60% to 65%. Doppler parameters are consistent with restrictive physiology, indicative of decreased left ventricular diastolic compliance and/or increased  left atrial pressure. Doppler parameters are consistent with high ventricular filling pressure. - Regional wall motion abnormality: Mild hypokinesis of the basal inferoseptal myocardium. - Aortic valve: Bioprosthetic aortic valve with mildly increased peak velocity and mean gradient. Peak velocity 3.41 m/s. There was no regurgitation. Mean gradient (S): 25 mm Hg. - Mitral valve: S/p mitral annuloplasty. Mild stenosis: mean gradient 6 mmHg. Mildly thickened leaflets . There was mild regurgitation. Valve area by pressure half-time: 1.63 cm^2. - Left atrium: The atrium was severely dilated. - Right ventricle: The cavity size was moderately dilated. Pacer wire or catheter noted in right ventricle. Systolic function was mildly reduced. - Right atrium: The atrium was severely dilated. Pacer wire or catheter noted in right atrium. - Tricuspid valve: There was moderate regurgitation. Mildly elevated pulmonary pressures: 40 mmHg. - Pulmonic valve: There was mild regurgitation. - Systemic veins: IVC is dilated with normal respiratory variation. Estimated CVP 8 mmHg.  Assessment and Plan:  1. Chronic diastolic  heart failure, overall clinically stable. We are continuing his current diuretic regimen. He will get a follow-up BMET this month through Dr. Jerene Dilling office.  2. Paroxysmal atrial fibrillation, continues on low-dose amiodarone and Coumadin.  3. CAD status post CABG with no active angina symptoms.  4. Status post bioprosthetic AVR and mitral valve repair.  5. Iron deficiency anemia, GI workup noted. He has been able to maintain Coumadin.  6. Sick sinus syndrome status post pacemaker placement, followed by Dr. Rayann Heman.  Current medicines were reviewed with the patient today.  Disposition: FU with me in 3 months.   Signed, Satira Sark, MD, [facility] 12/03/2015 12:12 PM    [facility] [place], [place], [place] [address] Phone: [phone]; Fax: [phone]

## 2015-12-03 NOTE — Patient Instructions (Signed)
Your physician recommends that you continue on your current medications as directed. Please refer to the Current Medication list given to you today. Your physician recommends that you schedule a follow-up appointment in: 3 months.  Please have lab work done to check your BMET at Dr. Drusilla Kanner office. Please let us know when this is available or have results faxed to our office. [phone].

## 2015-12-16 ENCOUNTER — Telehealth: Payer: Self-pay | Admitting: Internal Medicine

## 2015-12-16 NOTE — Telephone Encounter (Signed)
°  1. Has your device fired? no ° °2. Is you device beeping? no ° °3. Are you experiencing draining or swelling at device site? no ° °4. Are you calling to see if we received your device transmission? yes ° °5. Have you passed out? no ° °

## 2015-12-16 NOTE — Telephone Encounter (Signed)
Informed pt that we did receive his remote transmission on 11-20-2015. Pt verbalized understanding.

## 2015-12-18 ENCOUNTER — Other Ambulatory Visit: Payer: Self-pay | Admitting: Internal Medicine

## 2015-12-18 ENCOUNTER — Telehealth: Payer: Self-pay | Admitting: *Deleted

## 2015-12-18 NOTE — Telephone Encounter (Signed)
-----   Message from Satira Sark, MD sent at 12/18/2015 12:50 PM EDT ----- Reviewed. Creatinine stable at 1.05 and potassium 4.3. Continue same regimen.

## 2015-12-18 NOTE — Telephone Encounter (Signed)
Patient informed. 

## 2015-12-24 ENCOUNTER — Other Ambulatory Visit: Payer: Self-pay | Admitting: Cardiology

## 2016-01-01 ENCOUNTER — Encounter: Payer: Self-pay | Admitting: Cardiology

## 2016-01-01 LAB — CUP PACEART REMOTE DEVICE CHECK
Battery Impedance: 708 Ohm
Battery Remaining Longevity: 58 mo
Battery Voltage: 2.78 V
Brady Statistic AP VP Percent: 99 %
Brady Statistic AP VS Percent: 0 %
Brady Statistic AS VP Percent: 0 %
Brady Statistic AS VS Percent: 0 %
Date Time Interrogation Session: 20170426172317
Implantable Lead Implant Date: 20120815
Implantable Lead Implant Date: 20120815
Implantable Lead Location: 753859
Implantable Lead Location: 753860
Implantable Lead Model: 5076
Implantable Lead Model: 5076
Lead Channel Impedance Value: 424 Ohm
Lead Channel Impedance Value: 470 Ohm
Lead Channel Pacing Threshold Amplitude: 0.875 V
Lead Channel Pacing Threshold Pulse Width: 0.4 ms
Lead Channel Setting Pacing Amplitude: 2.5 V
Lead Channel Setting Pacing Amplitude: 2.5 V
Lead Channel Setting Pacing Pulse Width: 0.4 ms
Lead Channel Setting Sensing Sensitivity: 2 mV

## 2016-01-06 ENCOUNTER — Other Ambulatory Visit: Payer: Self-pay | Admitting: Internal Medicine

## 2016-01-14 ENCOUNTER — Encounter: Payer: Self-pay | Admitting: *Deleted

## 2016-01-17 ENCOUNTER — Other Ambulatory Visit (INDEPENDENT_AMBULATORY_CARE_PROVIDER_SITE_OTHER): Payer: Medicare Other

## 2016-01-17 ENCOUNTER — Other Ambulatory Visit: Payer: Self-pay | Admitting: Internal Medicine

## 2016-01-17 DIAGNOSIS — D509 Iron deficiency anemia, unspecified: Secondary | ICD-10-CM

## 2016-01-17 LAB — CBC WITH DIFFERENTIAL/PLATELET
Basophils Absolute: 0 10*3/uL (ref 0.0–0.1)
Basophils Relative: 0.8 % (ref 0.0–3.0)
Eosinophils Absolute: 0.2 10*3/uL (ref 0.0–0.7)
Eosinophils Relative: 3.2 % (ref 0.0–5.0)
HCT: 28.5 % — ABNORMAL LOW (ref 39.0–52.0)
Hemoglobin: 9.1 g/dL — ABNORMAL LOW (ref 13.0–17.0)
Lymphocytes Relative: 19.6 % (ref 12.0–46.0)
Lymphs Abs: 1.1 10*3/uL (ref 0.7–4.0)
MCHC: 31.9 g/dL (ref 30.0–36.0)
MCV: 84.6 fl (ref 78.0–100.0)
Monocytes Absolute: 0.6 10*3/uL (ref 0.1–1.0)
Monocytes Relative: 10.5 % (ref 3.0–12.0)
Neutro Abs: 3.7 10*3/uL (ref 1.4–7.7)
Neutrophils Relative %: 65.9 % (ref 43.0–77.0)
Platelets: 150 10*3/uL (ref 150.0–400.0)
RBC: 3.38 Mil/uL — ABNORMAL LOW (ref 4.22–5.81)
RDW: 17.2 % — ABNORMAL HIGH (ref 11.5–15.5)
WBC: 5.7 10*3/uL (ref 4.0–10.5)

## 2016-01-17 LAB — IBC PANEL
Iron: 20 ug/dL — ABNORMAL LOW (ref 42–165)
Saturation Ratios: 4.6 % — ABNORMAL LOW (ref 20.0–50.0)
Transferrin: 310 mg/dL (ref 212.0–360.0)

## 2016-01-17 LAB — FERRITIN: Ferritin: 22.2 ng/mL (ref 22.0–322.0)

## 2016-01-22 ENCOUNTER — Ambulatory Visit: Payer: Medicare Other | Admitting: Internal Medicine

## 2016-01-22 ENCOUNTER — Ambulatory Visit (INDEPENDENT_AMBULATORY_CARE_PROVIDER_SITE_OTHER): Payer: Medicare Other | Admitting: Internal Medicine

## 2016-01-22 ENCOUNTER — Encounter: Payer: Self-pay | Admitting: Internal Medicine

## 2016-01-22 VITALS — BP 132/60 | HR 60 | Ht 64.0 in | Wt 176.5 lb

## 2016-01-22 DIAGNOSIS — I2510 Atherosclerotic heart disease of native coronary artery without angina pectoris: Secondary | ICD-10-CM | POA: Diagnosis not present

## 2016-01-22 DIAGNOSIS — Z86010 Personal history of colonic polyps: Secondary | ICD-10-CM | POA: Diagnosis not present

## 2016-01-22 DIAGNOSIS — K2270 Barrett's esophagus without dysplasia: Secondary | ICD-10-CM | POA: Diagnosis not present

## 2016-01-22 DIAGNOSIS — D509 Iron deficiency anemia, unspecified: Secondary | ICD-10-CM

## 2016-01-22 NOTE — Progress Notes (Signed)
Subjective:    Patient ID: [name], male    DOB: [birth year], 82 y.o.   MRN: [id number]  HPI Mr. Akari Konkel is an 82 year old male with a history of iron deficiency anemia, adenomatous colon polyps, Barrett's esophagus who is here for follow-up. He is here with his wife today. He also has a history of atrial fibrillation on warfarin, CAD, peripheral vascular disease, chronic stomach heart failure. He was seen in March 2017 and then came for upper endoscopy and colonoscopy which were performed on 11/12/2015.  EGD revealed 4 cm of Barrett's esophagus which was biopsied and found to be Barrett's without dysplasia. There was a 3 cm hiatal hernia. Normal stomach and normal duodenum. Duodenal biopsies were negative for celiac.  Colonoscopy performed on the same day revealed 2 cecal polyps. 3 ascending colon polyps one of which was 14 mm and removed with hot snare. It was prophylactically endoclipped. The remainder of the examination was unremarkable. Internal hemorrhoids were seen. These polyps were found to be adenomas without high-grade dysplasia.  He received additional IV iron infusion and returns for follow-up.  On the whole he reports he is feeling fairly well. He does report moving slowly and having fatigue. He reports a good appetite. Bowel movements a been regular recently. He denies seeing visible blood or melena in his stool. He denies abdominal pain. Denies heartburn and trouble swallowing. He does use pantoprazole 40 mg daily. He denies recent chest pain has not had to use nitroglycerin.  He had labs done recently which showed a persistent but stable anemia with hemoglobin of 9.1. Percent iron saturation remain low and ferritin was borderline low.  Review of Systems As per history of present illness, otherwise negative  Current Medications, Allergies, Past Medical History, Past Surgical History, Family History and Social History were reviewed in [employer]  record.     Objective:   Physical Exam BP 132/60 mmHg  Pulse 60  Ht 5\' 4"  (1.626 m)  Wt 176 lb 8 oz (80.06 kg)  BMI 30.28 kg/m2 Constitutional: Well-developed and well-nourished. No distress. HEENT: Normocephalic and atraumatic. Oropharynx is clear and moist. No oropharyngeal exudate. Conjunctivae are normal.  No scleral icterus. Neck: Neck supple. Trachea midline. Cardiovascular: Irregularly irregular with 26 systolic ejection murmur  Plmonary/chest: Effort normal and breath sounds normal. No wheezing, rales or rhonchi. Abdominal: Soft, nontender, nondistended. Bowel sounds active throughout.  Extremities: no clubbing, cyanosis, or edema Neurological: Alert and oriented to person place and time. Skin: Skin is warm and dry. Psychiatric: Normal mood and affect. Behavior is normal.  CBC    Component Value Date/Time   WBC 5.7 01/17/2016 1530   RBC 3.38* 01/17/2016 1530   RBC 3.56* 08/01/2015 2322   HGB 9.1* 01/17/2016 1530   HCT 28.5* 01/17/2016 1530   PLT 150.0 01/17/2016 1530   MCV 84.6 01/17/2016 1530   MCH 21.5* 08/03/2015 1810   MCHC 31.9 01/17/2016 1530   RDW 17.2* 01/17/2016 1530   LYMPHSABS 1.1 01/17/2016 1530   MONOABS 0.6 01/17/2016 1530   EOSABS 0.2 01/17/2016 1530   BASOSABS 0.0 01/17/2016 1530    Iron/TIBC/Ferritin/ %Sat    Component Value Date/Time   IRON 20* 01/17/2016 1530   TIBC 489* 08/01/2015 2322   FERRITIN 22.2 01/17/2016 1530   IRONPCTSAT 4.6* 01/17/2016 1530   CBC Latest Ref Rng 01/17/2016 10/08/2015 08/03/2015  WBC 4.0 - 10.5 K/uL 5.7 4.8 5.7  Hemoglobin 13.0 - 17.0 g/dL 9.1(L) 9.6(L) 9.2(L)  Hematocrit 39.0 -  52.0 % 28.5(L) 30.1(L) 30.8(L)  Platelets 150.0 - 400.0 K/uL 150.0 162.0 152        Assessment & Plan:  82 year old male with a history of iron deficiency anemia, adenomatous colon polyps, Barrett's esophagus who is here for follow-up.  1. IDA in a patient on warfarin -- no overt bleeding. Upper endoscopy and colonoscopy rather  unrevealing for a source of GI blood loss. He did have colon polyps which were removed. Iron remains low and I recommended re-dosing with IV iron in the form of Feraheme 2. He will return degrees periphery these treatments. Begin Integra 1 capsule daily. We discussed this will darken his stool and possibly cause constipation. For this reason I have added Colace 200 mg in the evening. Repeat CBC and iron studies 1 month after iron infusion. He has been taking oral iron bar from the grocery store by his wife. I'm suspicious that this doesn't contain enough elemental iron and thus the change to Integra. Hemoglobin stable but we will need to get iron stores up before we can expect meaningful improvement in anemia. If after this series of iron infusions iron remains low I recommend hematology referral.  2. Barrett's esophagus -- without dysplasia. In this 82 year old male he will likely not need surveillance endoscopy. He understands and agrees with this recommendation. Continue pantoprazole 40 mg daily  3. History of adenomatous colon polyps -- discontinuation of colorectal cancer screening due to age. He understands and agrees.  25 minutes spent with the patient today. Greater than 50% was spent in counseling and coordination of care with the patient

## 2016-01-22 NOTE — Patient Instructions (Signed)
We have sent the following medications to your pharmacy for you to pick up at your convenience: Integra once daily  Continue pantoprazole.  While taking iron, purchase some Colace 200 mg to take once every evening.  You will need CBC, IBC 1 month after your 2nd IV iron infusion.  Follow up with Dr Hilarie Fredrickson in 4 months.  If you are age 80 or older, your body mass index should be between 23-30. Your Body mass index is 30.28 kg/(m^2). If this is out of the aforementioned range listed, please consider follow up with your Primary Care Provider.  If you are age 34 or younger, your body mass index should be between 19-25. Your Body mass index is 30.28 kg/(m^2). If this is out of the aformentioned range listed, please consider follow up with your Primary Care Provider.

## 2016-01-23 ENCOUNTER — Other Ambulatory Visit: Payer: Self-pay

## 2016-01-23 DIAGNOSIS — D509 Iron deficiency anemia, unspecified: Secondary | ICD-10-CM

## 2016-01-24 ENCOUNTER — Telehealth: Payer: Self-pay | Admitting: Internal Medicine

## 2016-01-24 MED ORDER — INTEGRA 62.5-62.5-40-3 MG PO CAPS: ORAL_CAPSULE | ORAL | Status: DC

## 2016-01-24 NOTE — Telephone Encounter (Signed)
Rx sent. Patients wife advised. 

## 2016-01-31 ENCOUNTER — Encounter (HOSPITAL_COMMUNITY): Payer: Self-pay

## 2016-01-31 ENCOUNTER — Encounter (HOSPITAL_COMMUNITY)
Admission: RE | Admit: 2016-01-31 | Discharge: 2016-01-31 | Disposition: A | Discharge status: Home / Self Care | Payer: Medicare Other | Source: Ambulatory Visit | Attending: Internal Medicine | Admitting: Internal Medicine

## 2016-01-31 VITALS — BP 114/60 | HR 60 | Temp 98.1°F | Resp 18 | Ht 64.0 in | Wt 176.0 lb

## 2016-01-31 DIAGNOSIS — D509 Iron deficiency anemia, unspecified: Secondary | ICD-10-CM | POA: Diagnosis present

## 2016-01-31 MED ORDER — FERUMOXYTOL INJECTION 510 MG/17 ML
510.0000 mg | INTRAVENOUS | Status: DC
  Administered 2016-01-31: 510 mg via INTRAVENOUS
  Filled 2016-01-31: qty 17

## 2016-01-31 MED ORDER — SODIUM CHLORIDE 0.9 % IV SOLN
INTRAVENOUS | Status: DC
  Administered 2016-01-31: 10:00:00 via INTRAVENOUS

## 2016-01-31 NOTE — Discharge Instructions (Signed)
FERAHEME °Ferumoxytol injection °What is this medicine? °FERUMOXYTOL is an iron complex. Iron is used to make healthy red blood cells, which carry oxygen and nutrients throughout the body. This medicine is used to treat iron deficiency anemia in people with chronic kidney disease. °This medicine may be used for other purposes; ask your health care provider or pharmacist if you have questions. °What should I tell my health care provider before I take this medicine? °They need to know if you have any of these conditions: °-anemia not caused by low iron levels °-high levels of iron in the blood °-magnetic resonance imaging (MRI) test scheduled °-an unusual or allergic reaction to iron, other medicines, foods, dyes, or preservatives °-pregnant or trying to get pregnant °-breast-feeding °How should I use this medicine? °This medicine is for injection into a vein. It is given by a health care professional in a hospital or clinic setting. °Talk to your pediatrician regarding the use of this medicine in children. Special care may be needed. °Overdosage: If you think you have taken too much of this medicine contact a poison control center or emergency room at once. °NOTE: This medicine is only for you. Do not share this medicine with others. °What if I miss a dose? °It is important not to miss your dose. Call your doctor or health care professional if you are unable to keep an appointment. °What may interact with this medicine? °This medicine may interact with the following medications: °-other iron products °This list may not describe all possible interactions. Give your health care provider a list of all the medicines, herbs, non-prescription drugs, or dietary supplements you use. Also tell them if you smoke, drink alcohol, or use illegal drugs. Some items may interact with your medicine. °What should I watch for while using this medicine? °Visit your doctor or healthcare professional regularly. Tell your doctor or  healthcare professional if your symptoms do not start to get better or if they get worse. You may need blood work done while you are taking this medicine. °You may need to follow a special diet. Talk to your doctor. Foods that contain iron include: whole grains/cereals, dried fruits, beans, or peas, leafy green vegetables, and organ meats (liver, kidney). °What side effects may I notice from receiving this medicine? °Side effects that you should report to your doctor or health care professional as soon as possible: °-allergic reactions like skin rash, itching or hives, swelling of the face, lips, or tongue °-breathing problems °-changes in blood pressure °-feeling faint or lightheaded, falls °-fever or chills °-flushing, sweating, or hot feelings °-swelling of the ankles or feet °Side effects that usually do not require medical attention (Report these to your doctor or health care professional if they continue or are bothersome.): °-diarrhea °-headache °-nausea, vomiting °-stomach pain °This list may not describe all possible side effects. Call your doctor for medical advice about side effects. You may report side effects to FDA at 1-800-FDA-1088. °Where should I keep my medicine? °This drug is given in a hospital or clinic and will not be stored at home. °NOTE: This sheet is a summary. It may not cover all possible information. If you have questions about this medicine, talk to your doctor, pharmacist, or health care provider. °  °© 2016, Elsevier/Gold Standard. (2012-02-26 15:23:36) °Anemia, Nonspecific °Anemia is a condition in which the concentration of red blood cells or hemoglobin in the blood is below normal. Hemoglobin is a substance in red blood cells that carries oxygen to the tissues   of the body. Anemia results in not enough oxygen reaching these tissues.  °CAUSES  °Common causes of anemia include:  °· Excessive bleeding. Bleeding may be internal or external. This includes excessive bleeding from periods  (in women) or from the intestine.   °· Poor nutrition.   °· Chronic kidney, thyroid, and liver disease.  °· Bone marrow disorders that decrease red blood cell production. °· Cancer and treatments for cancer. °· HIV, AIDS, and their treatments. °· Spleen problems that increase red blood cell destruction. °· Blood disorders. °· Excess destruction of red blood cells due to infection, medicines, and autoimmune disorders. °SIGNS AND SYMPTOMS  °· Minor weakness.   °· Dizziness.   °· Headache. °· Palpitations.   °· Shortness of breath, especially with exercise.   °· Paleness. °· Cold sensitivity. °· Indigestion. °· Nausea. °· Difficulty sleeping. °· Difficulty concentrating. °Symptoms may occur suddenly or they may develop slowly.  °DIAGNOSIS  °Additional blood tests are often needed. These help your health care provider determine the best treatment. Your health care provider will check your stool for blood and look for other causes of blood loss.  °TREATMENT  °Treatment varies depending on the cause of the anemia. Treatment can include:  °· Supplements of iron, vitamin B12, or folic acid.   °· Hormone medicines.   °· A blood transfusion. This may be needed if blood loss is severe.   °· Hospitalization. This may be needed if there is significant continual blood loss.   °· Dietary changes. °· Spleen removal. °HOME CARE INSTRUCTIONS °Keep all follow-up appointments. It often takes many weeks to correct anemia, and having your health care provider check on your condition and your response to treatment is very important. °SEEK IMMEDIATE MEDICAL CARE IF:  °· You develop extreme weakness, shortness of breath, or chest pain.   °· You become dizzy or have trouble concentrating. °· You develop heavy vaginal bleeding.   °· You develop a rash.   °· You have bloody or black, tarry stools.   °· You faint.   °· You vomit up blood.   °· You vomit repeatedly.   °· You have abdominal pain. °· You have a fever or persistent symptoms for  more than 2-3 days.   °· You have a fever and your symptoms suddenly get worse.   °· You are dehydrated.   °MAKE SURE YOU: °· Understand these instructions. °· Will watch your condition. °· Will get help right away if you are not doing well or get worse. °  °This information is not intended to replace advice given to you by your health care provider. Make sure you discuss any questions you have with your health care provider. °  °Document Released: 08/20/2004 Document Revised: 03/15/2013 Document Reviewed: 01/06/2013 °Elsevier Interactive Patient Education ©2016 Elsevier Inc. ° ° °

## 2016-01-31 NOTE — Progress Notes (Signed)
Uneventful infusion of Feraheme 510mg  dose #1 of this series. Next scheduled appointment is 02/07/16. Pt discharged via w/c accompanied by wife

## 2016-02-07 ENCOUNTER — Encounter (HOSPITAL_COMMUNITY)
Admission: RE | Admit: 2016-02-07 | Discharge: 2016-02-07 | Disposition: A | Discharge status: Home / Self Care | Payer: Medicare Other | Source: Ambulatory Visit | Attending: Internal Medicine | Admitting: Internal Medicine

## 2016-02-07 ENCOUNTER — Encounter (HOSPITAL_COMMUNITY): Payer: Self-pay

## 2016-02-07 VITALS — BP 126/60 | HR 57 | Temp 97.8°F | Resp 18 | Ht 64.0 in | Wt 171.6 lb

## 2016-02-07 DIAGNOSIS — D509 Iron deficiency anemia, unspecified: Secondary | ICD-10-CM

## 2016-02-07 MED ORDER — SODIUM CHLORIDE 0.9 % IV SOLN
INTRAVENOUS | Status: AC
  Administered 2016-02-07: 11:00:00 via INTRAVENOUS

## 2016-02-07 MED ORDER — SODIUM CHLORIDE 0.9 % IV SOLN
510.0000 mg | INTRAVENOUS | Status: AC
  Administered 2016-02-07: 510 mg via INTRAVENOUS
  Filled 2016-02-07: qty 17

## 2016-02-07 NOTE — Discharge Instructions (Signed)

## 2016-02-19 ENCOUNTER — Telehealth: Payer: Self-pay | Admitting: Cardiology

## 2016-02-19 ENCOUNTER — Ambulatory Visit (INDEPENDENT_AMBULATORY_CARE_PROVIDER_SITE_OTHER): Payer: Medicare Other | Admitting: *Deleted

## 2016-02-19 DIAGNOSIS — I495 Sick sinus syndrome: Secondary | ICD-10-CM

## 2016-02-19 DIAGNOSIS — Z950 Presence of cardiac pacemaker: Secondary | ICD-10-CM | POA: Diagnosis not present

## 2016-02-19 NOTE — Progress Notes (Signed)
Remote pacemaker transmission.   

## 2016-02-19 NOTE — Telephone Encounter (Signed)
LMOVM reminding pt to send remote transmission.   

## 2016-02-21 ENCOUNTER — Encounter: Payer: Self-pay | Admitting: Cardiology

## 2016-02-26 LAB — CUP PACEART REMOTE DEVICE CHECK
Battery Impedance: 838 Ohm
Battery Remaining Longevity: 53 mo
Battery Voltage: 2.77 V
Brady Statistic AP VP Percent: 99 %
Brady Statistic AP VS Percent: 0 %
Brady Statistic AS VP Percent: 0 %
Brady Statistic AS VS Percent: 0 %
Date Time Interrogation Session: 20170726172140
Implantable Lead Implant Date: 20120815
Implantable Lead Implant Date: 20120815
Implantable Lead Location: 753859
Implantable Lead Location: 753860
Implantable Lead Model: 5076
Implantable Lead Model: 5076
Lead Channel Impedance Value: 423 Ohm
Lead Channel Impedance Value: 467 Ohm
Lead Channel Pacing Threshold Amplitude: 1.125 V
Lead Channel Pacing Threshold Pulse Width: 0.4 ms
Lead Channel Setting Pacing Amplitude: 2.5 V
Lead Channel Setting Pacing Amplitude: 2.5 V
Lead Channel Setting Pacing Pulse Width: 0.4 ms
Lead Channel Setting Sensing Sensitivity: 2 mV

## 2016-03-09 ENCOUNTER — Ambulatory Visit (INDEPENDENT_AMBULATORY_CARE_PROVIDER_SITE_OTHER): Payer: Medicare Other | Admitting: Cardiology

## 2016-03-09 ENCOUNTER — Encounter: Payer: Self-pay | Admitting: Internal Medicine

## 2016-03-09 ENCOUNTER — Encounter: Payer: Self-pay | Admitting: Cardiology

## 2016-03-09 VITALS — BP 118/68 | HR 60 | Ht 66.0 in | Wt 174.0 lb

## 2016-03-09 DIAGNOSIS — I480 Paroxysmal atrial fibrillation: Secondary | ICD-10-CM

## 2016-03-09 DIAGNOSIS — I38 Endocarditis, valve unspecified: Secondary | ICD-10-CM | POA: Diagnosis not present

## 2016-03-09 DIAGNOSIS — I5032 Chronic diastolic (congestive) heart failure: Secondary | ICD-10-CM | POA: Diagnosis not present

## 2016-03-09 DIAGNOSIS — I2510 Atherosclerotic heart disease of native coronary artery without angina pectoris: Secondary | ICD-10-CM | POA: Diagnosis not present

## 2016-03-09 DIAGNOSIS — I495 Sick sinus syndrome: Secondary | ICD-10-CM

## 2016-03-09 MED ORDER — FUROSEMIDE 40 MG PO TABS: ORAL_TABLET | ORAL | 3 refills | Status: DC

## 2016-03-09 NOTE — Progress Notes (Signed)
**Note Riley-Identified via Obfuscation** Cardiology Office Note  Date: 03/09/2016   ID: [name], DOB Jun 13, 1929, MRN [id number]  PCP: Eber Hong, MD  Primary Cardiologist: Rozann Lesches, MD   Chief Complaint  Patient presents with  . Diastolic heart failure    History of Present Illness: [name] is a medically complex 82 y.o. male last seen in May. He is here today with his wife for a follow-up visit. Still doing reasonably well with stable weight, actually down 2 pounds. He reports compliance with his Lasix, needs a refill. He does not endorse any palpitations or chest pain.  He continues on Coumadin with follow-up per PCP. No reported bleeding episodes.  He continues to follow with Dr. Rayann Heman in the device clinic. Most recent interrogation showed normal device function.  Also following with Dr. Hilarie Fredrickson in the GI clinic. Has history of chronic iron deficiency anemia, had a recent iron infusion.  Past Medical History:  Diagnosis Date  . Anemia   . Arthritis   . Atrial flutter (HCC)    Amiodarone therapy, coumadin therapy, prior history of cardioversion and overdrive pacing  . Barrett's esophagus   . Benign prostatic hyperplasia   . Cardiomyopathy    Mixed ischemic and nonischemic with previous EF of 35-40%, most recently in June 2009 normalized to 55-60%. Echo 2/11 EF 55-60%  . Carotid artery disease (HCC)    Less than 50% bilateral internal carotid artery stenosis by Doppler 2009  . Carpal tunnel syndrome   . Chronic diastolic (congestive) heart failure ([place])   . Coronary atherosclerosis of native coronary artery    Status post coronary bypass grafting  . 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 Endocarditis   . Essential hypertension, benign   . Hyperlipidemia   . Hypothyroidism   . Internal hemorrhoids   . Iron deficiency anemia   . Obstructive sleep apnea   . Osteoporosis   . Paroxysmal atrial fibrillation (HCC)    Amiodarone therapy,  status post Cox-Maze procedure but with recurrent atrial fibrillation while on amiodarone August 2012  . Symptomatic bradycardia   . Tubular adenoma of colon   . Valvular heart disease    Status post by prosthetic aortic valve and mitral valve repair  . Warfarin anticoagulation     Past Surgical History:  Procedure Laterality Date  . AORTIC VALVE REPLACEMENT    . CARPAL TUNNEL RELEASE  2009  . CATARACT EXTRACTION, BILATERAL  07/2011  . CORONARY ARTERY BYPASS GRAFT  2008   x2  . HERNIA REPAIR  age 39  . KNEE ARTHROSCOPY    . PACEMAKER INSERTION  2009   Initially implanted at [facility], extracted by Dr Lovena Le for endocarditis and reimplanted 8/12  . PATELLA FRACTURE SURGERY  age 52   screw placed and removed  . TONSILLECTOMY  1939  . TOTAL HIP ARTHROPLASTY      Current Outpatient Prescriptions  Medication Sig Dispense Refill  . ALOE VERA JUICE PO Take 2 oz by mouth daily.     Marland Kitchen amiodarone (PACERONE) 200 MG tablet Take 0.5 tablets (100 mg total) by mouth daily. 15 tablet 6  . amLODipine (NORVASC) 10 MG tablet Take 0.5 tablets (5 mg total) by mouth daily. 30 tablet 6  . benazepril (LOTENSIN) 40 MG tablet TAKE 1 TABLET BY MOUTH DAILY 30 tablet 6  . carvedilol (COREG) 3.125 MG tablet TAKE 1 TABLET (3.125 MG TOTAL) BY MOUTH 2 (TWO) TIMES DAILY. 60 tablet 6  .  docusate sodium (COLACE) 100 MG capsule Take by mouth daily.    . Fe Fum-FePoly-FA-Vit C-Vit B3 (INTEGRA F) 125-1 MG CAPS Take by mouth.    . FIBER PO Take 1 packet by mouth daily. [employer] Brand fiber therapy    . furosemide (LASIX) 40 MG tablet Take 2 tabs (80mg ) by mouth every morning & 1 tab (40mg ) every evening, may take an extra 40mg  in the morning if needed. 90 tablet 1  . KLOR-CON M20 20 MEQ tablet TAKE 1 TABLET BY MOUTH DAILY 30 tablet 5  . KLOR-CON M20 20 MEQ tablet TAKE 1 TABLET BY MOUTH DAILY 30 tablet 6  . levothyroxine (SYNTHROID, LEVOTHROID) 50 MCG tablet Take 50 mcg by mouth daily before breakfast.    . Multiple  Vitamins-Minerals (THEREMS M PO) Take 1 tablet by mouth daily.      . nitroGLYCERIN (NITROSTAT) 0.4 MG SL tablet Place 1 tablet (0.4 mg total) under the tongue every 5 (five) minutes as needed. Up to 3 doses, if no relief after 3rd dose, proceed to the ED for an evaluation. 25 tablet 3  . pantoprazole (PROTONIX) 40 MG tablet Take 1 tablet (40 mg total) by mouth daily. 90 tablet 3  . pravastatin (PRAVACHOL) 40 MG tablet TAKE 1 TABLET BY MOUTH EVERY EVENING 30 tablet 11  . Probiotic Product (PROBIOTIC DAILY PO) Take 1 tablet by mouth daily.    Marland Kitchen warfarin (COUMADIN) 2 MG tablet Take 2 mg by mouth daily. Take 2mg  every day except on mondays take 1mg      No current facility-administered medications for this visit.    Allergies:  Review of patient's allergies indicates no known allergies.   Social History: The patient  reports that he quit smoking about 61 years ago. His smoking use included Cigarettes. He started smoking about 72 years ago. He has a 2.40 pack-year smoking history. He has never used smokeless tobacco. He reports that he drinks alcohol. He reports that he does not use drugs.   ROS:  Please see the history of present illness. Otherwise, complete review of systems is positive for chronic fatigue, no recent falls using a walker.  All other systems are reviewed and negative.   Physical Exam: VS:  BP 118/68   Pulse 60   Ht 5\' 6"  (1.676 m)   Wt 174 lb (78.9 kg)   SpO2 93%   BMI 28.08 kg/m , BMI Body mass index is 28.08 kg/m.  Wt Readings from Last 3 Encounters:  03/09/16 174 lb (78.9 kg)  02/07/16 171 lb 9.6 oz (77.8 kg)  01/31/16 176 lb (79.8 kg)    Chronically ill-appearing elderly male in no distress. Using walker today. HEENT: Conjunctiva and lids normal, oropharynx clear.  Neck: Supple, no elevated JVP, no thyromegaly.  Lungs: Clear to auscultation, nonlabored breathing at rest.  Cardiac: Regular rate and rhythm, no S3, 3/6 systolic murmur at base and apex, paradoxically  split S2, no pericardial rub.  Abdomen: Soft, nontender, protuberent, bowel sounds present, no guarding or rebound.  Extremities: Bilateral compression hose in place with 1+ pitting edema, distal pulses 1-2+.  Skin: Warm and dry. Musculoskeletal: No kyphosis. Neuropsychiatric: Alert and oriented 3, affect appropriate, diminished hearing.  ECG: I personally reviewed the tracing from 05/22/2015 which showed dual-chamber pacing.  Recent Labwork: 08/01/2015: ALT 26; AST 32 08/29/2015: BUN 24; Creat 1.10; Potassium 4.1; Sodium 139; TSH 8.727 01/17/2016: Hemoglobin 9.1; Platelets 150.0   Other Studies Reviewed Today:  Echocardiogram 09/06/2014: Study Conclusions  - Left  ventricle: The cavity size was normal. There was mild concentric hypertrophy. Systolic function was normal. The estimated ejection fraction was in the range of 60% to 65%. Doppler parameters are consistent with restrictive physiology, indicative of decreased left ventricular diastolic compliance and/or increased left atrial pressure. Doppler parameters are consistent with high ventricular filling pressure. - Regional wall motion abnormality: Mild hypokinesis of the basal inferoseptal myocardium. - Aortic valve: Bioprosthetic aortic valve with mildly increased peak velocity and mean gradient. Peak velocity 3.41 m/s. There was no regurgitation. Mean gradient (S): 25 mm Hg. - Mitral valve: S/p mitral annuloplasty. Mild stenosis: mean gradient 6 mmHg. Mildly thickened leaflets . There was mild regurgitation. Valve area by pressure half-time: 1.63 cm^2. - Left atrium: The atrium was severely dilated. - Right ventricle: The cavity size was moderately dilated. Pacer wire or catheter noted in right ventricle. Systolic function was mildly reduced. - Right atrium: The atrium was severely dilated. Pacer wire or catheter noted in right atrium. - Tricuspid valve: There was moderate regurgitation.  Mildly elevated pulmonary pressures: 40 mmHg. - Pulmonic valve: There was mild regurgitation. - Systemic veins: IVC is dilated with normal respiratory variation. Estimated CVP 8 mmHg.  Assessment and Plan:  1. Chronic diastolic heart failure, symptomatically stable with no significant change in weight. Refill provided for Lasix at same dose. Renal function was stable by last BMET.  2. Paroxysmal atrial fibrillation, well controlled on amiodarone and Coumadin.  3. No active angina symptoms with history of CAD status post CABG.  4. Bioprosthetic AVR and mitral valve repair, follow-up echocardiogram from last year noted above. Continue observation for now.  5. Sick sinus syndrome status post pacemaker placement, followed by Dr. Rayann Heman.  Current medicines were reviewed with the patient today.   Orders Placed This Encounter  Procedures  . EKG 12-Lead    Disposition: Follow-up with me in 4 months.  Signed, Satira Sark, MD, [facility] 03/09/2016 3:17 PM    [facility] [place], [place], [place] [address] Phone: [phone]; Fax: [phone]

## 2016-03-09 NOTE — Patient Instructions (Signed)
Medication Instructions:   Your physician recommends that you continue on your current medications as directed. Please refer to the Current Medication list given to you today.  Labwork:  NONE  Testing/Procedures:  NONE  Follow-Up:  Your physician recommends that you schedule a follow-up appointment in: 4 months.  Any Other Special Instructions Will Be Listed Below (If Applicable).  If you need a refill on your cardiac medications before your next appointment, please call your pharmacy. 

## 2016-03-16 ENCOUNTER — Telehealth: Payer: Self-pay | Admitting: *Deleted

## 2016-03-16 DIAGNOSIS — D649 Anemia, unspecified: Secondary | ICD-10-CM

## 2016-03-16 NOTE — Telephone Encounter (Signed)
Dr Hilarie Fredrickson received patient's CBC from 03/09/16 done at [facility] in [place], [place]. Dr Hilarie Fredrickson notes "Hemoglobin 9.9. Stable. Repeat CBC, IBC panel, ferritin in 1 month. Notify patient of this result." I have spoken to patient's wife, Tandy Gaw (patient hard of hearing on phone) to advise of results and Dr Vena Rua recommendations. She verbalizes understanding. I will contact her with a reminder to have repeat labs in about 1 month.

## 2016-04-07 NOTE — Addendum Note (Signed)
Addended by: Larina Bras on: 04/07/2016 08:47 AM   Modules accepted: Orders

## 2016-04-07 NOTE — Telephone Encounter (Signed)
Patient has been reminded to have repeat labwork completed this week. He would like to come to [place] to have labs this time. Orders placed in EPIC.

## 2016-04-08 ENCOUNTER — Other Ambulatory Visit: Payer: Self-pay | Admitting: Cardiology

## 2016-04-10 ENCOUNTER — Other Ambulatory Visit (INDEPENDENT_AMBULATORY_CARE_PROVIDER_SITE_OTHER): Payer: Medicare Other

## 2016-04-10 DIAGNOSIS — D649 Anemia, unspecified: Secondary | ICD-10-CM

## 2016-04-10 LAB — FERRITIN: Ferritin: 33.5 ng/mL (ref 22.0–322.0)

## 2016-04-10 LAB — CBC WITH DIFFERENTIAL/PLATELET
Basophils Absolute: 0.1 10*3/uL (ref 0.0–0.1)
Basophils Relative: 1.6 % (ref 0.0–3.0)
Eosinophils Absolute: 0.2 10*3/uL (ref 0.0–0.7)
Eosinophils Relative: 2.9 % (ref 0.0–5.0)
HCT: 30 % — ABNORMAL LOW (ref 39.0–52.0)
Hemoglobin: 9.8 g/dL — ABNORMAL LOW (ref 13.0–17.0)
Lymphocytes Relative: 19.5 % (ref 12.0–46.0)
Lymphs Abs: 1.1 10*3/uL (ref 0.7–4.0)
MCHC: 32.7 g/dL (ref 30.0–36.0)
MCV: 84.7 fl (ref 78.0–100.0)
Monocytes Absolute: 0.6 10*3/uL (ref 0.1–1.0)
Monocytes Relative: 11.8 % (ref 3.0–12.0)
Neutro Abs: 3.5 10*3/uL (ref 1.4–7.7)
Neutrophils Relative %: 64.2 % (ref 43.0–77.0)
Platelets: 149 10*3/uL — ABNORMAL LOW (ref 150.0–400.0)
RBC: 3.54 Mil/uL — ABNORMAL LOW (ref 4.22–5.81)
RDW: 18.7 % — ABNORMAL HIGH (ref 11.5–15.5)
WBC: 5.5 10*3/uL (ref 4.0–10.5)

## 2016-04-10 LAB — IBC PANEL
Iron: 21 ug/dL — ABNORMAL LOW (ref 42–165)
Saturation Ratios: 5.3 % — ABNORMAL LOW (ref 20.0–50.0)
Transferrin: 284 mg/dL (ref 212.0–360.0)

## 2016-04-17 ENCOUNTER — Other Ambulatory Visit: Payer: Self-pay

## 2016-04-17 ENCOUNTER — Telehealth: Payer: Self-pay | Admitting: Hematology

## 2016-04-17 ENCOUNTER — Encounter: Payer: Self-pay | Admitting: Hematology

## 2016-04-17 DIAGNOSIS — D509 Iron deficiency anemia, unspecified: Secondary | ICD-10-CM

## 2016-04-17 NOTE — Telephone Encounter (Signed)
Pt's wife confirmed appt, completed intake, mailed pt letter, request letter sent to: [place], [place] [address], in basket referring provider appt date/time

## 2016-04-30 ENCOUNTER — Other Ambulatory Visit: Payer: Self-pay | Admitting: Internal Medicine

## 2016-05-07 ENCOUNTER — Other Ambulatory Visit: Payer: Self-pay | Admitting: Cardiology

## 2016-05-11 ENCOUNTER — Encounter: Payer: Self-pay | Admitting: *Deleted

## 2016-05-14 ENCOUNTER — Telehealth: Payer: Self-pay | Admitting: Hematology

## 2016-05-14 ENCOUNTER — Ambulatory Visit (HOSPITAL_BASED_OUTPATIENT_CLINIC_OR_DEPARTMENT_OTHER): Payer: Medicare Other | Admitting: Hematology

## 2016-05-14 ENCOUNTER — Encounter: Payer: Self-pay | Admitting: Hematology

## 2016-05-14 ENCOUNTER — Ambulatory Visit (HOSPITAL_BASED_OUTPATIENT_CLINIC_OR_DEPARTMENT_OTHER): Payer: Medicare Other

## 2016-05-14 VITALS — BP 130/58 | HR 60 | Temp 98.1°F | Resp 18 | Ht 66.0 in | Wt 175.6 lb

## 2016-05-14 DIAGNOSIS — E538 Deficiency of other specified B group vitamins: Secondary | ICD-10-CM | POA: Diagnosis not present

## 2016-05-14 DIAGNOSIS — D500 Iron deficiency anemia secondary to blood loss (chronic): Secondary | ICD-10-CM | POA: Insufficient documentation

## 2016-05-14 LAB — CBC & DIFF AND RETIC
BASO%: 1.7 % (ref 0.0–2.0)
Basophils Absolute: 0.1 10*3/uL (ref 0.0–0.1)
EOS%: 3.4 % (ref 0.0–7.0)
Eosinophils Absolute: 0.2 10*3/uL (ref 0.0–0.5)
HCT: 35.1 % — ABNORMAL LOW (ref 38.4–49.9)
HGB: 11.1 g/dL — ABNORMAL LOW (ref 13.0–17.1)
Immature Retic Fract: 16 % — ABNORMAL HIGH (ref 3.00–10.60)
LYMPH%: 18.5 % (ref 14.0–49.0)
MCH: 27.2 pg (ref 27.2–33.4)
MCHC: 31.6 g/dL — ABNORMAL LOW (ref 32.0–36.0)
MCV: 86 fL (ref 79.3–98.0)
MONO#: 0.5 10*3/uL (ref 0.1–0.9)
MONO%: 10.6 % (ref 0.0–14.0)
NEUT#: 3.1 10*3/uL (ref 1.5–6.5)
NEUT%: 65.8 % (ref 39.0–75.0)
Platelets: 142 10*3/uL (ref 140–400)
RBC: 4.08 10*6/uL — ABNORMAL LOW (ref 4.20–5.82)
RDW: 17.2 % — ABNORMAL HIGH (ref 11.0–14.6)
Retic %: 1.48 % (ref 0.80–1.80)
Retic Ct Abs: 60.38 10*3/uL (ref 34.80–93.90)
WBC: 4.7 10*3/uL (ref 4.0–10.3)
lymph#: 0.9 10*3/uL (ref 0.9–3.3)

## 2016-05-14 LAB — COMPREHENSIVE METABOLIC PANEL
ALT: 13 U/L (ref 0–55)
AST: 19 U/L (ref 5–34)
Albumin: 3.9 g/dL (ref 3.5–5.0)
Alkaline Phosphatase: 110 U/L (ref 40–150)
Anion Gap: 11 mEq/L (ref 3–11)
BUN: 17.7 mg/dL (ref 7.0–26.0)
CO2: 26 mEq/L (ref 22–29)
Calcium: 9.6 mg/dL (ref 8.4–10.4)
Chloride: 107 mEq/L (ref 98–109)
Creatinine: 1 mg/dL (ref 0.7–1.3)
EGFR: 69 mL/min/{1.73_m2} — ABNORMAL LOW (ref >90)
Glucose: 93 mg/dl (ref 70–140)
Potassium: 3.5 mEq/L (ref 3.5–5.1)
Sodium: 144 mEq/L (ref 136–145)
Total Bilirubin: 0.44 mg/dL (ref 0.20–1.20)
Total Protein: 7.3 g/dL (ref 6.4–8.3)

## 2016-05-14 LAB — IRON AND TIBC
%SAT: 7 % — ABNORMAL LOW (ref 20–55)
Iron: 27 ug/dL — ABNORMAL LOW (ref 42–163)
TIBC: 385 ug/dL (ref 202–409)
UIBC: 358 ug/dL (ref 117–376)

## 2016-05-14 LAB — CHCC SMEAR

## 2016-05-14 LAB — FERRITIN: Ferritin: 36 ng/ml (ref 22–316)

## 2016-05-14 LAB — LACTATE DEHYDROGENASE: LDH: 223 U/L (ref 125–245)

## 2016-05-14 NOTE — Telephone Encounter (Signed)
Lab appointment added for today, as requested per M.D. Per 05/14/16 los. Lab and follow up appointment with Dr Irene Limbo was scheduled for 06/25/16 per 05/14/16 los AVS report and appointment schedule was given to patient, per 05/14/16 los.

## 2016-05-14 NOTE — Progress Notes (Signed)
Marland Kitchen    HEMATOLOGY/ONCOLOGY CONSULTATION NOTE  Date of Service: 05/14/2016  Patient Care Team: Eber Hong, MD as PCP - General (Internal Medicine) Zenovia Jarred MD (Gastroenterology)  CHIEF COMPLAINTS/PURPOSE OF CONSULTATION:  Iron deficiency anemia  HISTORY OF PRESENTING ILLNESS:   [name] is a wonderful 82 y.o. male who has been referred to [place] by Dr .Eber Hong, MD  for evaluation and management of Iron deficiency Anemia  He has a history of multiple medical comorbidities including coronary artery disease, chronic systolic CHF, atrial fibrillation on Coumadin, peripheral vascular disease, permanent pacemaker placement, adenomatous colonic polyps and long segment Barrett's esophagus without significant dysplasia on chronic PPI therapy.  Patient has been noting increasing fatigue from earlier this year and was noted to have anemia with hemoglobin of 7.8 with an MCV of 69 in January 2017. Ferritin level was noted to be 19 with an iron saturation of 3%. Fecal occult blood testing in March was noted to be positive for occult GI bleeding.  Patient received IV Feraheme in March and July 2017 for iron replacement. He had an upper endoscopy and colonoscopy which were performed on 11/12/2015. EGD revealed 4 cm of Barrett's esophagus which was biopsied and found to be Barrett's without dysplasia. There was a 3 cm hiatal hernia. Normal stomach and normal duodenum. Duodenal biopsies were negative for celiac. Colonoscopy performed on the same day revealed 2 cecal polyps. 3 ascending colon polyps one of which was 14 mm and removed with hot snare. It was prophylactically endoclipped. The remainder of the examination was unremarkable. Internal hemorrhoids were seen. These polyps were found to be adenomas without high-grade dysplasia. Has not had a capsule endoscopy.  Patient's hemoglobin levels have been somewhat better in the mid to high 9 range but with persistent iron deficiency and lack of  normalization of his anemia.  He has been referred to [place] for further management of his iron deficiency anemia. He continues to be on Coumadin for his atrial fibrillation. He has been on oral iron preparation which is causing some constipation and darker stools. Reports no overt GI bleeding. His last INR was 2.3 yesterday as per his report. He monitors this with his primary care physician. Notes persistent fatigue which is likely multifactorial.  Is on levothyroxine replacement for hypothyroidism. Takes a daily multivitamin.  Patient reports that he notes his weight closely to avoid fluid overload from CHF and notes no weight loss.  No other evidence of overt bleeding. No hematuria no gum bleeds no epistaxis. Notes easy skin bruising. Not using any over-the-counter NSAIDs.  MEDICAL HISTORY:  Past Medical History:  Diagnosis Date  . Anemia   . Arthritis   . Atrial flutter (HCC)    Amiodarone therapy, coumadin therapy, prior history of cardioversion and overdrive pacing  . Barrett's esophagus   . Benign prostatic hyperplasia   . Cardiomyopathy    Mixed ischemic and nonischemic with previous EF of 35-40%, most recently in June 2009 normalized to 55-60%. Echo 2/11 EF 55-60%  . Carotid artery disease (HCC)    Less than 50% bilateral internal carotid artery stenosis by Doppler 2009  . Carpal tunnel syndrome   . Chronic diastolic (congestive) heart failure   . Coronary atherosclerosis of native coronary artery    Status post coronary bypass grafting  . 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 Endocarditis   . Essential hypertension, benign   . Hiatal hernia   . Hyperlipidemia   .  Hypothyroidism   . Internal hemorrhoids   . Iron deficiency anemia   . Obstructive sleep apnea   . Osteoporosis   . Paroxysmal atrial fibrillation (HCC)    Amiodarone therapy, status post Cox-Maze procedure but with recurrent atrial fibrillation  while on amiodarone August 2012  . Symptomatic bradycardia   . Tubular adenoma of colon   . Valvular heart disease    Status post by prosthetic aortic valve and mitral valve repair  . Warfarin anticoagulation     SURGICAL HISTORY: Past Surgical History:  Procedure Laterality Date  . AORTIC VALVE REPLACEMENT    . CARPAL TUNNEL RELEASE  2009  . CATARACT EXTRACTION, BILATERAL  07/2011  . CORONARY ARTERY BYPASS GRAFT  2008   x2  . HERNIA REPAIR  age 50  . KNEE ARTHROSCOPY    . PACEMAKER INSERTION  2009   Initially implanted at [facility], extracted by Dr Lovena Le for endocarditis and reimplanted 8/12  . PATELLA FRACTURE SURGERY  age 71   screw placed and removed  . TONSILLECTOMY  1939  . TOTAL HIP ARTHROPLASTY      SOCIAL HISTORY: Social History   Social History  . Marital status: Married    Spouse name: N/A  . Number of children: 3  . Years of education: N/A   Occupational History  . Retired    Social History Main Topics  . Smoking status: Former Smoker    Packs/day: 0.30    Years: 8.00    Types: Cigarettes    Start date: 03/25/1944    Quit date: 07/27/1954  . Smokeless tobacco: Never Used  . Alcohol use 0.0 oz/week     Comment: glass of wine occ  . Drug use: No  . Sexual activity: Not on file   Other Topics Concern  . Not on file   Social History Narrative  . No narrative on file    FAMILY HISTORY: Family History  Problem Relation Age of Onset  . Diabetes Neg Hx   . Hypertension Neg Hx   . Esophageal cancer Neg Hx   . Rectal cancer Neg Hx   . Stomach cancer Neg Hx   . Coronary artery disease Father   . Other Father     Heart attack or stroke  . Cancer Father   . Colon cancer Mother     ALLERGIES:  has No Known Allergies.  MEDICATIONS:  Current Outpatient Prescriptions  Medication Sig Dispense Refill  . ALOE VERA JUICE PO Take 2 oz by mouth daily.     Marland Kitchen amiodarone (PACERONE) 200 MG tablet Take 0.5 tablets (100 mg total) by mouth daily. 15 tablet 6  .  amLODipine (NORVASC) 10 MG tablet Take 0.5 tablets (5 mg total) by mouth daily. 30 tablet 6  . benazepril (LOTENSIN) 40 MG tablet TAKE 1 TABLET BY MOUTH DAILY 30 tablet 6  . benazepril (LOTENSIN) 40 MG tablet TAKE 1 TABLET BY MOUTH DAILY 30 tablet 6  . carvedilol (COREG) 3.125 MG tablet TAKE 1 TABLET (3.125 MG TOTAL) BY MOUTH 2 (TWO) TIMES DAILY. 60 tablet 6  . docusate sodium (COLACE) 100 MG capsule Take by mouth daily.    . Fe Fum-FePoly-FA-Vit C-Vit B3 (INTEGRA F) 125-1 MG CAPS Take by mouth.    . Fe Fum-FePoly-Vit C-Vit B3 (INTEGRA) 62.5-62.5-40-3 MG CAPS TAKE ONE CAPSULE BY MOUTH EVERY DAY 30 capsule 1  . FIBER PO Take 1 packet by mouth daily. [employer] Brand fiber therapy    . furosemide (LASIX) 40 MG  tablet Take 2 tabs (80mg ) by mouth every morning & 1 tab (40mg ) every evening, may take an extra 40mg  in the morning if needed. 100 tablet 3  . furosemide (LASIX) 40 MG tablet TAKE 2 TABLETS BY MOUTH IN THE MORNING AND TAKE 1 TAB EVERY EVEVING,MAY TAKE 1 EXTRA TAB IN AM. 90 tablet 1  . KLOR-CON M20 20 MEQ tablet TAKE 1 TABLET BY MOUTH DAILY 30 tablet 6  . levothyroxine (SYNTHROID, LEVOTHROID) 50 MCG tablet Take 50 mcg by mouth daily before breakfast.    . Multiple Vitamins-Minerals (THEREMS M PO) Take 1 tablet by mouth daily.      . nitroGLYCERIN (NITROSTAT) 0.4 MG SL tablet Place 1 tablet (0.4 mg total) under the tongue every 5 (five) minutes as needed. Up to 3 doses, if no relief after 3rd dose, proceed to the ED for an evaluation. 25 tablet 3  . pantoprazole (PROTONIX) 40 MG tablet Take 1 tablet (40 mg total) by mouth daily. 90 tablet 3  . pravastatin (PRAVACHOL) 40 MG tablet TAKE 1 TABLET BY MOUTH EVERY EVENING 30 tablet 11  . Probiotic Product (PROBIOTIC DAILY PO) Take 1 tablet by mouth daily.    Marland Kitchen warfarin (COUMADIN) 2 MG tablet Take 2 mg by mouth daily. Take 2mg  every day except on mondays take 1mg      No current facility-administered medications for this visit.     REVIEW OF  SYSTEMS:    10 Point review of Systems was done is negative except as noted above.  PHYSICAL EXAMINATION: ECOG PERFORMANCE STATUS: 3 - Symptomatic, >50% confined to bed  . Vitals:   05/14/16 1121  BP: (!) 130/58  Pulse: 60  Resp: 18  Temp: 98.1 F (36.7 C)   Filed Weights   05/14/16 1121  Weight: 175 lb 9.6 oz (79.7 kg)   .Body mass index is 28.34 kg/m.  GENERAL:alert, in no acute distress and comfortable SKIN: skin color, texture, turgor are normal, no rashes or significant lesions EYES: normal, conjunctiva are pink and non-injected, sclera clear OROPHARYNX:no exudate, no erythema and lips, buccal mucosa, and tongue normal  NECK: supple, no JVD, thyroid normal size, non-tender, without nodularity LYMPH:  no palpable lymphadenopathy in the cervical, axillary or inguinal LUNGS: clear to auscultation with normal respiratory effort HEART: regular rate & rhythm,  no murmurs and no lower extremity edema ABDOMEN: abdomen soft, non-tender, normoactive bowel sounds  Musculoskeletal: no cyanosis of digits and no clubbing  PSYCH: alert & oriented x 3 with fluent speech NEURO: no focal motor/sensory deficits  LABORATORY DATA:  I have reviewed the data as listed  . CBC Latest Ref Rng & Units 04/10/2016 01/17/2016 10/08/2015  WBC 4.0 - 10.5 K/uL 5.5 5.7 4.8  Hemoglobin 13.0 - 17.0 g/dL 9.8(L) 9.1(L) 9.6(L)  Hematocrit 39.0 - 52.0 % 30.0(L) 28.5(L) 30.1(L)  Platelets 150.0 - 400.0 K/uL 149.0(L) 150.0 162.0   . CBC    Component Value Date/Time   WBC 5.5 04/10/2016 1535   RBC 3.54 (L) 04/10/2016 1535   HGB 9.8 (L) 04/10/2016 1535   HCT 30.0 (L) 04/10/2016 1535   PLT 149.0 (L) 04/10/2016 1535   MCV 84.7 04/10/2016 1535   MCH 21.5 (L) 08/03/2015 1810   MCHC 32.7 04/10/2016 1535   RDW 18.7 (H) 04/10/2016 1535   LYMPHSABS 1.1 04/10/2016 1535   MONOABS 0.6 04/10/2016 1535   EOSABS 0.2 04/10/2016 1535   BASOSABS 0.1 04/10/2016 1535   . CMP Latest Ref Rng & Units 08/29/2015  08/03/2015 08/02/2015  Glucose 65 - 99 mg/dL 123(H) 100(H) 89  BUN 7 - 25 mg/dL 24 24(H) 21(H)  Creatinine 0.70 - 1.11 mg/dL 1.10 1.36(H) 1.43(H)  Sodium 135 - 146 mmol/L 139 143 142  Potassium 3.5 - 5.3 mmol/L 4.1 3.6 3.3(L)  Chloride 98 - 110 mmol/L 102 105 105  CO2 20 - 31 mmol/L 29 27 28   Calcium 8.6 - 10.3 mg/dL 9.2 9.1 9.0  Total Protein 6.5 - 8.1 g/dL - - -  Total Bilirubin 0.3 - 1.2 mg/dL - - -  Alkaline Phos 38 - 126 U/L - - -  AST 15 - 41 U/L - - -  ALT 17 - 63 U/L - - -   . Lab Results  Component Value Date   IRON 21 (L) 04/10/2016   TIBC 489 (H) 08/01/2015   IRONPCTSAT 5.3 (L) 04/10/2016   (Iron and TIBC)  Lab Results  Component Value Date   FERRITIN 33.5 04/10/2016   EGD 11/12/2015 - Esophageal mucosal changes suggestive of long-segment Barrett's esophagus. Biopsied. - 3 cm hiatal hernia. - Normal stomach. - Normal examined duodenum. Biopsied.  Colonoscopy 11/12/2015 - Two 4 to 5 mm polyps in the cecum, removed with a cold snare. Resected and retrieved. - Two 4 to 5 mm polyps in the ascending colon, removed with a cold snare. Resected and Retrieved - One 14 mm polyp in the ascending colon, removed with a hot snare. Resected and retrieved. Clip was placed. - Internal hemorrhoids.   RADIOGRAPHIC STUDIES: I have personally reviewed the radiological images as listed and agreed with the findings in the report. No results found.  ASSESSMENT & PLAN:   82 year old Caucasian male with multiple medical comorbidities with  1) Microcytic Anemia -predominantly related to iron deficiency from chronic GI losses. Patient has been on chronic anticoagulation for his atrial fibrillation and this will continue to be a risk factor from going GI losses. He has received IV Feraheme 2 in March and July 2017 without complete correction of his iron deficiency. This is suggestive of ongoing GI losses. 2) long segment Barrett's esophagus without dysplasia. 3) hiatal hernia 4)  multiple adenomatous colonic polyps. Plan -We shall get labs today including CBC, CMP, ferritin, iron profile, B12 -We'll should also get an LDH and haptoglobin to rule out valve related hemolysis that could cause hemoglobinuria with resultant iron deficiency. -Smear to rule out any baseline evidence of MDS in the setting of his advanced age. -Myeloma panel. -Will defer to Dr. Hilarie Fredrickson regarding consideration of capsule endoscopy to rule out small bowel source of bleeding. -Avoid NSAIDs -Would try to keep patient's INR in the lower end of the 2-3 range and avoid overshooting his anticoagulation for atrial fibrillation. -We shall set him up for IV Injectafer 750mg  qweekly x 2 doses (patient has a Iron deficit of nearly 2 grams and likely has ongoing IV loses). Would like to minimize volume infused due to his chronic systolic CHF therefore we shall choose IV Injectafer (could do 2 doses vs 3 with Feraheme) -ferritin goal of >100 -Hold oral iron at this time.  5). Patient Active Problem List   Diagnosis Date Noted  . Iron deficiency anemia due to chronic blood loss 05/14/2016  . Barrett's esophagus without dysplasia 01/22/2016  . Anemia 08/01/2015  . Symptomatic anemia 08/01/2015  . Chronic diastolic CHF (congestive heart failure) ([place]) 08/01/2015  . Sick sinus syndrome ([place]) 03/20/2013  . Pacemaker 11/24/2011  . Warfarin anticoagulation   . Paroxysmal atrial fibrillation (HCC)   .  Valvular heart disease   . Atrial flutter ([place])   . Carotid artery disease ([place])   . History of endocarditis   . HYPERLIPIDEMIA 10/10/2010  . Obstructive sleep apnea 11/01/2009  . Hypertensive cardiovascular disease 04/10/2009  . Coronary atherosclerosis of native coronary artery 04/10/2009  . Secondary cardiomyopathy ([place]) 04/10/2009   -continue f/u with cardiology and PCP as per their recommendations.   Return to care with Dr. Irene Limbo in 6-8 weeks after IV iron to reassess response to treatment and need for  additional IV iron replacement.  All of the patients questions were answered with apparent satisfaction. The patient knows to call the clinic with any problems, questions or concerns.  I spent 50 minutes counseling the patient face to face. The total time spent in the appointment was 60 minutes and more than 50% was on counseling and direct patient cares.    Sullivan Lone MD [place] AAHIVMS [facility] Hematology/Oncology Physician [facility]  (Office):       [phone] (Work cell):  [phone] (Fax):           [phone]  05/14/2016 11:18 AM

## 2016-05-15 ENCOUNTER — Telehealth: Payer: Self-pay | Admitting: *Deleted

## 2016-05-15 LAB — HAPTOGLOBIN: Haptoglobin: 127 mg/dL (ref 34–200)

## 2016-05-15 LAB — VITAMIN B12: Vitamin B12: 1065 pg/mL — ABNORMAL HIGH (ref 211–946)

## 2016-05-15 NOTE — Telephone Encounter (Signed)
Per LOS I have scheduled appts for the patient and notified the scheduler

## 2016-05-18 ENCOUNTER — Telehealth: Payer: Self-pay | Admitting: *Deleted

## 2016-05-18 NOTE — Telephone Encounter (Signed)
Patient's wife called and requested appts. I gave her appts and the appts did not work for them. I gave new appts and they are aware

## 2016-05-19 ENCOUNTER — Ambulatory Visit: Payer: Medicare Other

## 2016-05-19 LAB — MULTIPLE MYELOMA PANEL, SERUM
Albumin SerPl Elph-Mcnc: 3.9 g/dL (ref 2.9–4.4)
Albumin/Glob SerPl: 1.3 (ref 0.7–1.7)
Alpha 1: 0.3 g/dL (ref 0.0–0.4)
Alpha2 Glob SerPl Elph-Mcnc: 0.8 g/dL (ref 0.4–1.0)
B-Globulin SerPl Elph-Mcnc: 1 g/dL (ref 0.7–1.3)
Gamma Glob SerPl Elph-Mcnc: 1 g/dL (ref 0.4–1.8)
Globulin, Total: 3.1 g/dL (ref 2.2–3.9)
IgA, Qn, Serum: 194 mg/dL (ref 61–437)
IgG, Qn, Serum: 946 mg/dL (ref 700–1600)
IgM, Qn, Serum: 56 mg/dL (ref 15–143)
Total Protein: 7 g/dL (ref 6.0–8.5)

## 2016-05-22 ENCOUNTER — Ambulatory Visit (HOSPITAL_BASED_OUTPATIENT_CLINIC_OR_DEPARTMENT_OTHER): Payer: Medicare Other

## 2016-05-22 VITALS — BP 133/69 | HR 59 | Temp 98.6°F | Resp 18

## 2016-05-22 DIAGNOSIS — D500 Iron deficiency anemia secondary to blood loss (chronic): Secondary | ICD-10-CM | POA: Diagnosis present

## 2016-05-22 MED ORDER — ACETAMINOPHEN 325 MG PO TABS
650.0000 mg | ORAL_TABLET | Freq: Once | ORAL | Status: AC
  Administered 2016-05-22: 650 mg via ORAL

## 2016-05-22 MED ORDER — ACETAMINOPHEN 325 MG PO TABS
ORAL_TABLET | ORAL | Status: AC
  Filled 2016-05-22: qty 2

## 2016-05-22 MED ORDER — SODIUM CHLORIDE 0.9 % IV SOLN
750.0000 mg | Freq: Once | INTRAVENOUS | Status: AC
  Administered 2016-05-22: 750 mg via INTRAVENOUS
  Filled 2016-05-22: qty 15

## 2016-05-22 NOTE — Patient Instructions (Signed)
Ferric carboxymaltose injection  What is this medicine?  FERRIC CARBOXYMALTOSE (ferr-ik car-box-ee-mol-toes) is an iron complex. Iron is used to make healthy red blood cells, which carry oxygen and nutrients throughout the body. This medicine is used to treat anemia in people with chronic kidney disease or people who cannot take iron by mouth.  This medicine may be used for other purposes; ask your health care provider or pharmacist if you have questions.  What should I tell my health care provider before I take this medicine?  They need to know if you have any of these conditions:  -anemia not caused by low iron levels  -high levels of iron in the blood  -liver disease  -an unusual or allergic reaction to iron, other medicines, foods, dyes, or preservatives  -pregnant or trying to get pregnant  -breast-feeding  How should I use this medicine?  This medicine is for infusion into a vein. It is given by a health care professional in a hospital or clinic setting.  Talk to your pediatrician regarding the use of this medicine in children. Special care may be needed.  Overdosage: If you think you have taken too much of this medicine contact a poison control center or emergency room at once.  NOTE: This medicine is only for you. Do not share this medicine with others.  What if I miss a dose?  It is important not to miss your dose. Call your doctor or health care professional if you are unable to keep an appointment.  What may interact with this medicine?  Do not take this medicine with any of the following medications:  -deferoxamine  -dimercaprol  -other iron products  This medicine may also interact with the following medications:  -chloramphenicol  -deferasirox  This list may not describe all possible interactions. Give your health care provider a list of all the medicines, herbs, non-prescription drugs, or dietary supplements you use. Also tell them if you smoke, drink alcohol, or use illegal drugs. Some items may  interact with your medicine.  What should I watch for while using this medicine?  Visit your doctor or health care professional regularly. Tell your doctor if your symptoms do not start to get better or if they get worse. You may need blood work done while you are taking this medicine.  You may need to follow a special diet. Talk to your doctor. Foods that contain iron include: whole grains/cereals, dried fruits, beans, or peas, leafy green vegetables, and organ meats (liver, kidney).  What side effects may I notice from receiving this medicine?  Side effects that you should report to your doctor or health care professional as soon as possible:  -allergic reactions like skin rash, itching or hives, swelling of the face, lips, or tongue -breathing problems  -changes in blood pressure  -feeling faint or lightheaded, falls  -flushing, sweating, or hot feelings  Side effects that usually do not require medical attention (Report these to your doctor or health care professional if they continue or are bothersome.):  -changes in taste  -constipation  -dizziness  -headache  -nausea  -pain, redness, or irritation at site where injected  -vomiting  This list may not describe all possible side effects. Call your doctor for medical advice about side effects. You may report side effects to FDA at 1-800-FDA-1088.  Where should I keep my medicine?  This drug is given in a hospital or clinic and will not be stored at home.  NOTE: This sheet is a   summary. It may not cover all possible information. If you have questions about this medicine, talk to your doctor, pharmacist, or health care provider.      2016, Elsevier/Gold Standard. (2012-02-26 15:36:34)

## 2016-05-26 ENCOUNTER — Telehealth: Payer: Self-pay | Admitting: Internal Medicine

## 2016-05-26 ENCOUNTER — Ambulatory Visit: Payer: Medicare Other

## 2016-05-26 NOTE — Telephone Encounter (Signed)
Dr Pyrtle Please advise  

## 2016-05-26 NOTE — Telephone Encounter (Signed)
Patient's wife is advised that per Dr Hilarie Fredrickson they may reschedule upcoming appointment until after iron infusion if he does not have any overt bleeding. Patient has been rescheduled to 08/18/16. They verbalize understanding.

## 2016-05-26 NOTE — Telephone Encounter (Signed)
He has recently seen hematology, Dr. Irene Limbo, and is getting IV iron coming up for persistent IDA  follow-up with me was just to ensure continuity and that this issue is being addressed If he is not having overt bleeding and wishes to reschedule this appointment until after iron replacement per hematology, this is okay with me

## 2016-05-28 ENCOUNTER — Ambulatory Visit: Payer: Medicare Other | Admitting: Internal Medicine

## 2016-05-29 ENCOUNTER — Ambulatory Visit (HOSPITAL_BASED_OUTPATIENT_CLINIC_OR_DEPARTMENT_OTHER): Payer: Medicare Other

## 2016-05-29 VITALS — BP 134/61 | HR 60 | Temp 98.2°F | Resp 18

## 2016-05-29 DIAGNOSIS — D500 Iron deficiency anemia secondary to blood loss (chronic): Secondary | ICD-10-CM

## 2016-05-29 MED ORDER — ACETAMINOPHEN 325 MG PO TABS
ORAL_TABLET | ORAL | Status: AC
  Filled 2016-05-29: qty 2

## 2016-05-29 MED ORDER — ACETAMINOPHEN 325 MG PO TABS
650.0000 mg | ORAL_TABLET | Freq: Once | ORAL | Status: AC
  Administered 2016-05-29: 650 mg via ORAL

## 2016-05-29 MED ORDER — SODIUM CHLORIDE 0.9 % IV SOLN
750.0000 mg | Freq: Once | INTRAVENOUS | Status: AC
  Administered 2016-05-29: 750 mg via INTRAVENOUS
  Filled 2016-05-29: qty 15

## 2016-05-29 NOTE — Patient Instructions (Signed)
Ferric carboxymaltose injection  What is this medicine?  FERRIC CARBOXYMALTOSE (ferr-ik car-box-ee-mol-toes) is an iron complex. Iron is used to make healthy red blood cells, which carry oxygen and nutrients throughout the body. This medicine is used to treat anemia in people with chronic kidney disease or people who cannot take iron by mouth.  This medicine may be used for other purposes; ask your health care provider or pharmacist if you have questions.  What should I tell my health care provider before I take this medicine?  They need to know if you have any of these conditions:  -anemia not caused by low iron levels  -high levels of iron in the blood  -liver disease  -an unusual or allergic reaction to iron, other medicines, foods, dyes, or preservatives  -pregnant or trying to get pregnant  -breast-feeding  How should I use this medicine?  This medicine is for infusion into a vein. It is given by a health care professional in a hospital or clinic setting.  Talk to your pediatrician regarding the use of this medicine in children. Special care may be needed.  Overdosage: If you think you have taken too much of this medicine contact a poison control center or emergency room at once.  NOTE: This medicine is only for you. Do not share this medicine with others.  What if I miss a dose?  It is important not to miss your dose. Call your doctor or health care professional if you are unable to keep an appointment.  What may interact with this medicine?  Do not take this medicine with any of the following medications:  -deferoxamine  -dimercaprol  -other iron products  This medicine may also interact with the following medications:  -chloramphenicol  -deferasirox  This list may not describe all possible interactions. Give your health care provider a list of all the medicines, herbs, non-prescription drugs, or dietary supplements you use. Also tell them if you smoke, drink alcohol, or use illegal drugs. Some items may  interact with your medicine.  What should I watch for while using this medicine?  Visit your doctor or health care professional regularly. Tell your doctor if your symptoms do not start to get better or if they get worse. You may need blood work done while you are taking this medicine.  You may need to follow a special diet. Talk to your doctor. Foods that contain iron include: whole grains/cereals, dried fruits, beans, or peas, leafy green vegetables, and organ meats (liver, kidney).  What side effects may I notice from receiving this medicine?  Side effects that you should report to your doctor or health care professional as soon as possible:  -allergic reactions like skin rash, itching or hives, swelling of the face, lips, or tongue -breathing problems  -changes in blood pressure  -feeling faint or lightheaded, falls  -flushing, sweating, or hot feelings  Side effects that usually do not require medical attention (Report these to your doctor or health care professional if they continue or are bothersome.):  -changes in taste  -constipation  -dizziness  -headache  -nausea  -pain, redness, or irritation at site where injected  -vomiting  This list may not describe all possible side effects. Call your doctor for medical advice about side effects. You may report side effects to FDA at 1-800-FDA-1088.  Where should I keep my medicine?  This drug is given in a hospital or clinic and will not be stored at home.  NOTE: This sheet is a   summary. It may not cover all possible information. If you have questions about this medicine, talk to your doctor, pharmacist, or health care provider.      2016, Elsevier/Gold Standard. (2012-02-26 15:36:34)

## 2016-06-03 ENCOUNTER — Encounter: Payer: Self-pay | Admitting: Internal Medicine

## 2016-06-03 ENCOUNTER — Ambulatory Visit (INDEPENDENT_AMBULATORY_CARE_PROVIDER_SITE_OTHER): Payer: Medicare Other | Admitting: Internal Medicine

## 2016-06-03 VITALS — BP 129/66 | HR 60 | Ht 66.0 in | Wt 178.0 lb

## 2016-06-03 DIAGNOSIS — I480 Paroxysmal atrial fibrillation: Secondary | ICD-10-CM | POA: Diagnosis not present

## 2016-06-03 DIAGNOSIS — I2510 Atherosclerotic heart disease of native coronary artery without angina pectoris: Secondary | ICD-10-CM

## 2016-06-03 DIAGNOSIS — Z950 Presence of cardiac pacemaker: Secondary | ICD-10-CM | POA: Diagnosis not present

## 2016-06-03 DIAGNOSIS — I38 Endocarditis, valve unspecified: Secondary | ICD-10-CM

## 2016-06-03 DIAGNOSIS — R0602 Shortness of breath: Secondary | ICD-10-CM

## 2016-06-03 DIAGNOSIS — I495 Sick sinus syndrome: Secondary | ICD-10-CM | POA: Diagnosis not present

## 2016-06-03 LAB — CUP PACEART INCLINIC DEVICE CHECK
Battery Impedance: 917 Ohm
Battery Remaining Longevity: 51 mo
Battery Voltage: 2.76 V
Brady Statistic AP VP Percent: 99 %
Brady Statistic AP VS Percent: 0 %
Brady Statistic AS VP Percent: 0 %
Brady Statistic AS VS Percent: 0 %
Date Time Interrogation Session: 20171108173416
Implantable Lead Implant Date: 20120815
Implantable Lead Implant Date: 20120815
Implantable Lead Location: 753859
Implantable Lead Location: 753860
Implantable Lead Model: 5076
Implantable Lead Model: 5076
Implantable Pulse Generator Implant Date: 20120815
Lead Channel Impedance Value: 430 Ohm
Lead Channel Impedance Value: 480 Ohm
Lead Channel Pacing Threshold Amplitude: 1 V
Lead Channel Pacing Threshold Amplitude: 1.25 V
Lead Channel Pacing Threshold Pulse Width: 0.4 ms
Lead Channel Pacing Threshold Pulse Width: 0.4 ms
Lead Channel Sensing Intrinsic Amplitude: 2.8 mV
Lead Channel Setting Pacing Amplitude: 2.5 V
Lead Channel Setting Pacing Amplitude: 2.5 V
Lead Channel Setting Pacing Pulse Width: 0.4 ms
Lead Channel Setting Sensing Sensitivity: 2 mV

## 2016-06-03 MED ORDER — AMIODARONE HCL 200 MG PO TABS: 100.0000 mg | ORAL_TABLET | Freq: Every day | ORAL | 3 refills | Status: DC

## 2016-06-03 NOTE — Progress Notes (Signed)
PCP:  Eber Hong, MD Primary Cardiologist:  [name]  The patient presents today for routine electrophysiology followup.  Since last being seen in our clinic, the patient reports doing reasonably well.  He has had no further falls but remains unsteady.  He has iron deficiency anemia with workup ongoing. AF is well controlled.  Today, he denies symptoms of palpitations, chest pain, shortness of breath, orthopnea, PND, presyncope, syncope, or further neurologic sequela.  The patient feels that he is tolerating medications without difficulties and is otherwise without complaint today.   Past Medical History:  Diagnosis Date  . Anemia   . Arthritis   . Atrial flutter (HCC)    Amiodarone therapy, coumadin therapy, prior history of cardioversion and overdrive pacing  . Barrett's esophagus   . Benign prostatic hyperplasia   . Cardiomyopathy    Mixed ischemic and nonischemic with previous EF of 35-40%, most recently in June 2009 normalized to 55-60%. Echo 2/11 EF 55-60%  . Carotid artery disease (HCC)    Less than 50% bilateral internal carotid artery stenosis by Doppler 2009  . Carpal tunnel syndrome   . Chronic diastolic (congestive) heart failure   . Coronary atherosclerosis of native coronary artery    Status post coronary bypass grafting  . 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 Endocarditis   . Essential hypertension, benign   . Hiatal hernia   . Hyperlipidemia   . Hypothyroidism   . Internal hemorrhoids   . Iron deficiency anemia   . Obstructive sleep apnea   . Osteoporosis   . Paroxysmal atrial fibrillation (HCC)    Amiodarone therapy, status post Cox-Maze procedure but with recurrent atrial fibrillation while on amiodarone August 2012  . Symptomatic bradycardia   . Tubular adenoma of colon   . Valvular heart disease    Status post by prosthetic aortic valve and mitral valve repair  . Warfarin anticoagulation     Past Surgical History:  Procedure Laterality Date  . AORTIC VALVE REPLACEMENT    . CARPAL TUNNEL RELEASE  2009  . CATARACT EXTRACTION, BILATERAL  07/2011  . CORONARY ARTERY BYPASS GRAFT  2008   x2  . HERNIA REPAIR  age 73  . KNEE ARTHROSCOPY    . PACEMAKER INSERTION  2009   Initially implanted at [facility], extracted by Dr Lovena Le for endocarditis and reimplanted 8/12  . PATELLA FRACTURE SURGERY  age 27   screw placed and removed  . TONSILLECTOMY  1939  . TOTAL HIP ARTHROPLASTY      Current Outpatient Prescriptions  Medication Sig Dispense Refill  . ALOE VERA JUICE PO Take 2 oz by mouth daily.     Marland Kitchen amiodarone (PACERONE) 200 MG tablet Take 0.5 tablets (100 mg total) by mouth daily. 45 tablet 3  . amLODipine (NORVASC) 10 MG tablet Take 0.5 tablets (5 mg total) by mouth daily. 30 tablet 6  . benazepril (LOTENSIN) 40 MG tablet TAKE 1 TABLET BY MOUTH DAILY 30 tablet 6  . carvedilol (COREG) 3.125 MG tablet TAKE 1 TABLET (3.125 MG TOTAL) BY MOUTH 2 (TWO) TIMES DAILY. 60 tablet 6  . docusate sodium (COLACE) 100 MG capsule Take 200 mg by mouth daily.     Marland Kitchen FIBER PO Take 1 packet by mouth daily. ACACIA FIBER-[employer] Brand fiber therapy    . furosemide (LASIX) 40 MG tablet TAKE 2 TABLETS BY MOUTH IN THE MORNING AND TAKE 1 TAB EVERY EVEVING,MAY TAKE 1 EXTRA  TAB IN AM. 90 tablet 1  . KLOR-CON M20 20 MEQ tablet TAKE 1 TABLET BY MOUTH DAILY 30 tablet 6  . levothyroxine (SYNTHROID, LEVOTHROID) 50 MCG tablet Take 50 mcg by mouth daily before breakfast.    . Multiple Vitamins-Minerals (THEREMS M PO) Take 1 tablet by mouth daily.      . nitroGLYCERIN (NITROSTAT) 0.4 MG SL tablet Place 1 tablet (0.4 mg total) under the tongue every 5 (five) minutes as needed. Up to 3 doses, if no relief after 3rd dose, proceed to the ED for an evaluation. 25 tablet 3  . pantoprazole (PROTONIX) 40 MG tablet Take 1 tablet (40 mg total) by mouth daily. 90 tablet 3  . pravastatin (PRAVACHOL) 40 MG tablet TAKE 1 TABLET BY  MOUTH EVERY EVENING 30 tablet 11  . Probiotic Product (PROBIOTIC DAILY PO) Take 1 tablet by mouth daily.    Marland Kitchen warfarin (COUMADIN) 2 MG tablet Take 2 mg by mouth daily. Take 2mg  every day except on mondays take 1mg      No current facility-administered medications for this visit.     No Known Allergies  Social History   Social History  . Marital status: Married    Spouse name: N/A  . Number of children: 3  . Years of education: N/A   Occupational History  . Retired    Social History Main Topics  . Smoking status: Former Smoker    Packs/day: 0.30    Years: 8.00    Types: Cigarettes    Start date: 03/25/1944    Quit date: 07/27/1954  . Smokeless tobacco: Never Used  . Alcohol use 0.0 oz/week     Comment: glass of wine occ  . Drug use: No  . Sexual activity: Not on file   Other Topics Concern  . Not on file   Social History Narrative  . No narrative on file    Family History  Problem Relation Age of Onset  . Coronary artery disease Father   . Other Father     Heart attack or stroke  . Cancer Father   . Colon cancer Mother   . Diabetes Neg Hx   . Hypertension Neg Hx   . Esophageal cancer Neg Hx   . Rectal cancer Neg Hx   . Stomach cancer Neg Hx    ROS- all systems are reviewed and negative except as per HPI above  Physical Exam: Vitals:   06/03/16 1457  BP: 129/66  Pulse: 60  SpO2: 96%  Weight: 178 lb (80.7 kg)  Height: 5\' 6"  (1.676 m)    GEN- The patient is elderly appearing, alert and oriented x 3 today.   Head- normocephalic, atraumatic Eyes-  Sclera clear, conjunctiva pink Ears- hearing intact Oropharynx- clear Neck- supple  Lungs- Clear to ausculation bilaterally, normal work of breathing Chest- pacemaker pocket is well healed, old L sided pocket also well healed Heart- Regular rate and rhythm, 3/6 SEM LUSB (early peaking), 3/6 SEM at the apex GI- soft, NT, ND, + BS Extremities- no clubbing, cyanosis, trace edema  Pacemaker interrogation-  reviewed in detail today,  See PACEART report  Assessment and Plan:  1. Sick sinus syndrome/ symptomatic bradycadria Normal pacemaker function See Pace Art report No changes today  2. afib Well conrolled on amiodarone 100mg  daily  3. Hypertensive cardiovascular disease Stable Reduce norvasc to 5mg  daily  4. Valvular heart disease On coumadin Echo 2/16 reviewed Rather harsh murmur on exam.  Will repeat echo prior to follow-up with Dr  McDowell in December.  Return to see me in [place] clinic in 1 year [facility]  Thompson Grayer MD, [facility] 06/03/2016 4:44 PM

## 2016-06-03 NOTE — Patient Instructions (Signed)
Medication Instructions:  Continue all current medications.  Labwork: none  Testing/Procedures:  Your physician has requested that you have an echocardiogram. Echocardiography is a painless test that uses sound waves to create images of your heart. It provides your doctor with information about the size and shape of your heart and how well your heart's chambers and valves are working. This procedure takes approximately one hour. There are no restrictions for this procedure.  Office will contact with results via phone or letter.    Follow-Up: 1 year - Dr. Rayann Heman   Any Other Special Instructions Will Be Listed Below (If Applicable). Remote monitoring is used to monitor your Pacemaker of ICD from home. This monitoring reduces the number of office visits required to check your device to one time per year. It allows [place] to keep an eye on the functioning of your device to ensure it is working properly. You are scheduled for a device check from home on 09/02/2016. You may send your transmission at any time that day. If you have a wireless device, the transmission will be sent automatically. After your physician reviews your transmission, you will receive a postcard with your next transmission date.  If you need a refill on your cardiac medications before your next appointment, please call your pharmacy.

## 2016-06-05 ENCOUNTER — Encounter: Payer: Medicare Other | Admitting: Internal Medicine

## 2016-06-25 ENCOUNTER — Encounter: Payer: Self-pay | Admitting: Hematology

## 2016-06-25 ENCOUNTER — Telehealth: Payer: Self-pay | Admitting: Hematology

## 2016-06-25 ENCOUNTER — Other Ambulatory Visit (HOSPITAL_BASED_OUTPATIENT_CLINIC_OR_DEPARTMENT_OTHER): Payer: Medicare Other

## 2016-06-25 ENCOUNTER — Other Ambulatory Visit: Payer: Self-pay | Admitting: *Deleted

## 2016-06-25 ENCOUNTER — Ambulatory Visit (HOSPITAL_BASED_OUTPATIENT_CLINIC_OR_DEPARTMENT_OTHER): Payer: Medicare Other | Admitting: Hematology

## 2016-06-25 VITALS — BP 140/59 | HR 60 | Temp 97.6°F | Resp 20

## 2016-06-25 DIAGNOSIS — D500 Iron deficiency anemia secondary to blood loss (chronic): Secondary | ICD-10-CM | POA: Diagnosis present

## 2016-06-25 DIAGNOSIS — Z7901 Long term (current) use of anticoagulants: Secondary | ICD-10-CM | POA: Diagnosis not present

## 2016-06-25 LAB — COMPREHENSIVE METABOLIC PANEL
ALT: 20 U/L (ref 0–55)
AST: 22 U/L (ref 5–34)
Albumin: 3.6 g/dL (ref 3.5–5.0)
Alkaline Phosphatase: 125 U/L (ref 40–150)
Anion Gap: 8 mEq/L (ref 3–11)
BUN: 18.5 mg/dL (ref 7.0–26.0)
CO2: 24 mEq/L (ref 22–29)
Calcium: 9.2 mg/dL (ref 8.4–10.4)
Chloride: 108 mEq/L (ref 98–109)
Creatinine: 0.9 mg/dL (ref 0.7–1.3)
EGFR: 78 mL/min/{1.73_m2} — ABNORMAL LOW (ref >90)
Glucose: 98 mg/dl (ref 70–140)
Potassium: 4 mEq/L (ref 3.5–5.1)
Sodium: 140 mEq/L (ref 136–145)
Total Bilirubin: 0.5 mg/dL (ref 0.20–1.20)
Total Protein: 6.7 g/dL (ref 6.4–8.3)

## 2016-06-25 LAB — CBC & DIFF AND RETIC
BASO%: 1.5 % (ref 0.0–2.0)
Basophils Absolute: 0.1 10*3/uL (ref 0.0–0.1)
EOS%: 2.5 % (ref 0.0–7.0)
Eosinophils Absolute: 0.1 10*3/uL (ref 0.0–0.5)
HCT: 35.5 % — ABNORMAL LOW (ref 38.4–49.9)
HGB: 11.4 g/dL — ABNORMAL LOW (ref 13.0–17.1)
Immature Retic Fract: 16.2 % — ABNORMAL HIGH (ref 3.00–10.60)
LYMPH%: 16.2 % (ref 14.0–49.0)
MCH: 28.1 pg (ref 27.2–33.4)
MCHC: 32.1 g/dL (ref 32.0–36.0)
MCV: 87.7 fL (ref 79.3–98.0)
MONO#: 0.6 10*3/uL (ref 0.1–0.9)
MONO%: 10.5 % (ref 0.0–14.0)
NEUT#: 3.6 10*3/uL (ref 1.5–6.5)
NEUT%: 69.3 % (ref 39.0–75.0)
Platelets: 119 10*3/uL — ABNORMAL LOW (ref 140–400)
RBC: 4.05 10*6/uL — ABNORMAL LOW (ref 4.20–5.82)
RDW: 19.7 % — ABNORMAL HIGH (ref 11.0–14.6)
Retic %: 1.39 % (ref 0.80–1.80)
Retic Ct Abs: 56.3 10*3/uL (ref 34.80–93.90)
WBC: 5.3 10*3/uL (ref 4.0–10.3)
lymph#: 0.9 10*3/uL (ref 0.9–3.3)

## 2016-06-25 LAB — IRON AND TIBC
%SAT: 16 % — ABNORMAL LOW (ref 20–55)
Iron: 45 ug/dL (ref 42–163)
TIBC: 281 ug/dL (ref 202–409)
UIBC: 236 ug/dL (ref 117–376)

## 2016-06-25 LAB — FERRITIN: Ferritin: 205 ng/ml (ref 22–316)

## 2016-06-25 MED ORDER — POLYSACCHARIDE IRON COMPLEX 150 MG PO CAPS: 150.0000 mg | ORAL_CAPSULE | Freq: Every day | ORAL | 2 refills | Status: DC

## 2016-06-25 NOTE — Telephone Encounter (Signed)
LVM with patient stating ferritin is WNL.  No indication for additional iron infusion at this time.  Per Dr. Irene Limbo, pt instructed to take iron polysaccharide tablet 150mg  po daily.  Will see pt back in 3 months with repeat labs.

## 2016-06-25 NOTE — Telephone Encounter (Signed)
Appointments scheduled per 06/25/16 los. A copy of the AVS report and appointment schedule was given to patient, per 06/25/16 los. °

## 2016-06-26 NOTE — Progress Notes (Signed)
Marland Kitchen    HEMATOLOGY/ONCOLOGY CLINIC NOTE  Date of Service: .06/25/2016  Patient Care Team: Eber Hong, MD as PCP - General (Internal Medicine) Zenovia Jarred MD (Gastroenterology)  CHIEF COMPLAINTS/PURPOSE OF CONSULTATION:  Iron deficiency anemia  HISTORY OF PRESENTING ILLNESS:   [name] is a wonderful 82 y.o. male who has been referred to [place] by Dr .Eber Hong, MD  for evaluation and management of Iron deficiency Anemia  He has a history of multiple medical comorbidities including coronary artery disease, chronic systolic CHF, atrial fibrillation on Coumadin, peripheral vascular disease, permanent pacemaker placement, adenomatous colonic polyps and long segment Barrett's esophagus without significant dysplasia on chronic PPI therapy.  Patient has been noting increasing fatigue from earlier this year and was noted to have anemia with hemoglobin of 7.8 with an MCV of 69 in January 2017. Ferritin level was noted to be 19 with an iron saturation of 3%. Fecal occult blood testing in March was noted to be positive for occult GI bleeding.  Patient received IV Feraheme in March and July 2017 for iron replacement. He had an upper endoscopy and colonoscopy which were performed on 11/12/2015. EGD revealed 4 cm of Barrett's esophagus which was biopsied and found to be Barrett's without dysplasia. There was a 3 cm hiatal hernia. Normal stomach and normal duodenum. Duodenal biopsies were negative for celiac. Colonoscopy performed on the same day revealed 2 cecal polyps. 3 ascending colon polyps one of which was 14 mm and removed with hot snare. It was prophylactically endoclipped. The remainder of the examination was unremarkable. Internal hemorrhoids were seen. These polyps were found to be adenomas without high-grade dysplasia. Has not had a capsule endoscopy.  Patient's hemoglobin levels have been somewhat better in the mid to high 9 range but with persistent iron deficiency and lack of  normalization of his anemia.  He has been referred to [place] for further management of his iron deficiency anemia. He continues to be on Coumadin for his atrial fibrillation. He has been on oral iron preparation which is causing some constipation and darker stools. Reports no overt GI bleeding. His last INR was 2.3 yesterday as per his report. He monitors this with his primary care physician. Notes persistent fatigue which is likely multifactorial.  Is on levothyroxine replacement for hypothyroidism. Takes a daily multivitamin.  Patient reports that he notes his weight closely to avoid fluid overload from CHF and notes no weight loss.  No other evidence of overt bleeding. No hematuria no gum bleeds no epistaxis. Notes easy skin bruising. Not using any over-the-counter NSAIDs.  INTERVAL HISTORY  [name] Is here for his scheduled 2 month follow-up after having received IV injectafer  for his significant iron deficiency anemia . His hemoglobin has improved progressively to 11.4 with normalization of his MCV . He notes improving energy levels . No overt GI bleeding noted . Ferritin levels up to 205 .   MEDICAL HISTORY:  Past Medical History:  Diagnosis Date  . Anemia   . Arthritis   . Atrial flutter (HCC)    Amiodarone therapy, coumadin therapy, prior history of cardioversion and overdrive pacing  . Barrett's esophagus   . Benign prostatic hyperplasia   . Cardiomyopathy    Mixed ischemic and nonischemic with previous EF of 35-40%, most recently in June 2009 normalized to 55-60%. Echo 2/11 EF 55-60%  . Carotid artery disease (HCC)    Less than 50% bilateral internal carotid artery stenosis by Doppler 2009  . Carpal tunnel syndrome   .  Chronic diastolic (congestive) heart failure   . Coronary atherosclerosis of native coronary artery    Status post coronary bypass grafting  . 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 Endocarditis     . Essential hypertension, benign   . Hiatal hernia   . Hyperlipidemia   . Hypothyroidism   . Internal hemorrhoids   . Iron deficiency anemia   . Obstructive sleep apnea   . Osteoporosis   . Paroxysmal atrial fibrillation (HCC)    Amiodarone therapy, status post Cox-Maze procedure but with recurrent atrial fibrillation while on amiodarone August 2012  . Symptomatic bradycardia   . Tubular adenoma of colon   . Valvular heart disease    Status post by prosthetic aortic valve and mitral valve repair  . Warfarin anticoagulation     SURGICAL HISTORY: Past Surgical History:  Procedure Laterality Date  . AORTIC VALVE REPLACEMENT    . CARPAL TUNNEL RELEASE  2009  . CATARACT EXTRACTION, BILATERAL  07/2011  . CORONARY ARTERY BYPASS GRAFT  2008   x2  . HERNIA REPAIR  age 60  . KNEE ARTHROSCOPY    . PACEMAKER INSERTION  2009   Initially implanted at [facility], extracted by Dr Lovena Le for endocarditis and reimplanted 8/12  . PATELLA FRACTURE SURGERY  age 62   screw placed and removed  . TONSILLECTOMY  1939  . TOTAL HIP ARTHROPLASTY      SOCIAL HISTORY: Social History   Social History  . Marital status: Married    Spouse name: N/A  . Number of children: 3  . Years of education: N/A   Occupational History  . Retired    Social History Main Topics  . Smoking status: Former Smoker    Packs/day: 0.30    Years: 8.00    Types: Cigarettes    Start date: 03/25/1944    Quit date: 07/27/1954  . Smokeless tobacco: Never Used  . Alcohol use 0.0 oz/week     Comment: glass of wine occ  . Drug use: No  . Sexual activity: Not on file   Other Topics Concern  . Not on file   Social History Narrative  . No narrative on file    FAMILY HISTORY: Family History  Problem Relation Age of Onset  . Coronary artery disease Father   . Other Father     Heart attack or stroke  . Cancer Father   . Colon cancer Mother   . Diabetes Neg Hx   . Hypertension Neg Hx   . Esophageal cancer Neg Hx   .  Rectal cancer Neg Hx   . Stomach cancer Neg Hx     ALLERGIES:  has No Known Allergies.  MEDICATIONS:  Current Outpatient Prescriptions  Medication Sig Dispense Refill  . ALOE VERA JUICE PO Take 2 oz by mouth daily.     Marland Kitchen amiodarone (PACERONE) 200 MG tablet Take 0.5 tablets (100 mg total) by mouth daily. 45 tablet 3  . amLODipine (NORVASC) 10 MG tablet Take 0.5 tablets (5 mg total) by mouth daily. 30 tablet 6  . benazepril (LOTENSIN) 40 MG tablet TAKE 1 TABLET BY MOUTH DAILY 30 tablet 6  . carvedilol (COREG) 3.125 MG tablet TAKE 1 TABLET (3.125 MG TOTAL) BY MOUTH 2 (TWO) TIMES DAILY. 60 tablet 6  . docusate sodium (COLACE) 100 MG capsule Take 200 mg by mouth daily.     Marland Kitchen FIBER PO Take 1 packet by mouth daily. ACACIA  FIBER-[employer] Brand fiber therapy    . furosemide (LASIX) 40 MG tablet TAKE 2 TABLETS BY MOUTH IN THE MORNING AND TAKE 1 TAB EVERY EVEVING,MAY TAKE 1 EXTRA TAB IN AM. 90 tablet 1  . KLOR-CON M20 20 MEQ tablet TAKE 1 TABLET BY MOUTH DAILY 30 tablet 6  . levothyroxine (SYNTHROID, LEVOTHROID) 50 MCG tablet Take 50 mcg by mouth daily before breakfast.    . Multiple Vitamins-Minerals (THEREMS M PO) Take 1 tablet by mouth daily.      . nitroGLYCERIN (NITROSTAT) 0.4 MG SL tablet Place 1 tablet (0.4 mg total) under the tongue every 5 (five) minutes as needed. Up to 3 doses, if no relief after 3rd dose, proceed to the ED for an evaluation. 25 tablet 3  . pantoprazole (PROTONIX) 40 MG tablet Take 1 tablet (40 mg total) by mouth daily. 90 tablet 3  . pravastatin (PRAVACHOL) 40 MG tablet TAKE 1 TABLET BY MOUTH EVERY EVENING 30 tablet 11  . Probiotic Product (PROBIOTIC DAILY PO) Take 1 tablet by mouth daily.    Marland Kitchen warfarin (COUMADIN) 2 MG tablet Take 2 mg by mouth daily. Take 2mg  every day except on mondays take 1mg     . iron polysaccharides (NIFEREX) 150 MG capsule Take 1 capsule (150 mg total) by mouth daily. 30 capsule 2   No current facility-administered medications for this visit.       REVIEW OF SYSTEMS:    10 Point review of Systems was done is negative except as noted above.  PHYSICAL EXAMINATION: ECOG PERFORMANCE STATUS: 3 - Symptomatic, >50% confined to bed  . Vitals:   06/25/16 1233  BP: (!) 140/59  Pulse: 60  Resp: 20  Temp: 97.6 F (36.4 C)   There were no vitals filed for this visit. .There is no height or weight on file to calculate BMI.  GENERAL:alert, in no acute distress and comfortable SKIN: skin color, texture, turgor are normal, no rashes or significant lesions EYES: normal, conjunctiva are pink and non-injected, sclera clear OROPHARYNX:no exudate, no erythema and lips, buccal mucosa, and tongue normal  NECK: supple, no JVD, thyroid normal size, non-tender, without nodularity LYMPH:  no palpable lymphadenopathy in the cervical, axillary or inguinal LUNGS: clear to auscultation with normal respiratory effort HEART: regular rate & rhythm,  no murmurs and no lower extremity edema ABDOMEN: abdomen soft, non-tender, normoactive bowel sounds  Musculoskeletal: no cyanosis of digits and no clubbing  PSYCH: alert & oriented x 3 with fluent speech NEURO: no focal motor/sensory deficits  LABORATORY DATA:  I have reviewed the data as listed  . CBC Latest Ref Rng & Units 06/25/2016 05/14/2016 04/10/2016  WBC 4.0 - 10.3 10e3/uL 5.3 4.7 5.5  Hemoglobin 13.0 - 17.1 g/dL 11.4(L) 11.1(L) 9.8(L)  Hematocrit 38.4 - 49.9 % 35.5(L) 35.1(L) 30.0(L)  Platelets 140 - 400 10e3/uL 119(L) 142 149.0(L)   . CBC    Component Value Date/Time   WBC 5.3 06/25/2016 1201   WBC 5.5 04/10/2016 1535   RBC 4.05 (L) 06/25/2016 1201   RBC 3.54 (L) 04/10/2016 1535   HGB 11.4 (L) 06/25/2016 1201   HCT 35.5 (L) 06/25/2016 1201   PLT 119 (L) 06/25/2016 1201   MCV 87.7 06/25/2016 1201   MCH 28.1 06/25/2016 1201   MCH 21.5 (L) 08/03/2015 1810   MCHC 32.1 06/25/2016 1201   MCHC 32.7 04/10/2016 1535   RDW 19.7 (H) 06/25/2016 1201   LYMPHSABS 0.9 06/25/2016 1201    MONOABS 0.6 06/25/2016 1201   EOSABS  0.1 06/25/2016 1201   BASOSABS 0.1 06/25/2016 1201   . CMP Latest Ref Rng & Units 06/25/2016 05/14/2016 05/14/2016  Glucose 70 - 140 mg/dl 98 93 -  BUN 7.0 - 26.0 mg/dL 18.5 17.7 -  Creatinine 0.7 - 1.3 mg/dL 0.9 1.0 -  Sodium 136 - 145 mEq/L 140 144 -  Potassium 3.5 - 5.1 mEq/L 4.0 3.5 -  Chloride 98 - 110 mmol/L - - -  CO2 22 - 29 mEq/L 24 26 -  Calcium 8.4 - 10.4 mg/dL 9.2 9.6 -  Total Protein 6.4 - 8.3 g/dL 6.7 7.0 7.3  Total Bilirubin 0.20 - 1.20 mg/dL 0.50 0.44 -  Alkaline Phos 40 - 150 U/L 125 110 -  AST 5 - 34 U/L 22 19 -  ALT 0 - 55 U/L 20 13 -   . Lab Results  Component Value Date   IRON 45 06/25/2016   TIBC 281 06/25/2016   IRONPCTSAT 16 (L) 06/25/2016   (Iron and TIBC)  Lab Results  Component Value Date   FERRITIN 205 06/25/2016   EGD 11/12/2015 - Esophageal mucosal changes suggestive of long-segment Barrett's esophagus. Biopsied. - 3 cm hiatal hernia. - Normal stomach. - Normal examined duodenum. Biopsied.  Colonoscopy 11/12/2015 - Two 4 to 5 mm polyps in the cecum, removed with a cold snare. Resected and retrieved. - Two 4 to 5 mm polyps in the ascending colon, removed with a cold snare. Resected and Retrieved - One 14 mm polyp in the ascending colon, removed with a hot snare. Resected and retrieved. Clip was placed. - Internal hemorrhoids.   RADIOGRAPHIC STUDIES: I have personally reviewed the radiological images as listed and agreed with the findings in the report. No results found.  ASSESSMENT & PLAN:   82 year old Caucasian male with multiple medical comorbidities with  1) Microcytic Anemia -predominantly related to iron deficiency from chronic GI losses. Now nearly resolved with hemoglobin up to 11.4 and normalization of MCV . Patient has been on chronic anticoagulation for his atrial fibrillation and this will continue to be a risk factor from going GI losses. He has received IV Feraheme 2 in March and  July 2017 without complete correction of his iron deficiency. This is suggestive of ongoing GI losses.  Initial workup as noted below showed no monoclonal protein. No evidence of hemolysis with normal LDH and haptoglobin. Component     Latest Ref Rng & Units 05/14/2016  IgG (Immunoglobin G), Serum     700 - 1,600 mg/dL 946  IgA/Immunoglobulin A, Serum     61 - 437 mg/dL 194  IgM, Qn, Serum     15 - 143 mg/dL 56  Total Protein     6.0 - 8.5 g/dL 7.0  Albumin SerPl Elph-Mcnc     2.9 - 4.4 g/dL 3.9  Alpha 1     0.0 - 0.4 g/dL 0.3  Alpha2 Glob SerPl Elph-Mcnc     0.4 - 1.0 g/dL 0.8  B-Globulin SerPl Elph-Mcnc     0.7 - 1.3 g/dL 1.0  Gamma Glob SerPl Elph-Mcnc     0.4 - 1.8 g/dL 1.0  M Protein SerPl Elph-Mcnc     Not Observed g/dL Not Observed  Globulin, Total     2.2 - 3.9 g/dL 3.1  Albumin/Glob SerPl     0.7 - 1.7 1.3  IFE 1      Comment  Please Note (HCV):      Comment  LDH     125 - 245 U/L  223  Vitamin B12     211 - 946 pg/mL 1,065 (H)  Haptoglobin     34 - 200 mg/dL 127    Patient had IV Injectafer 750mg  qweekly x2 doses on 05/22/2016 and 05/29/2016 . 2) long segment Barrett's esophagus without dysplasia. 3) hiatal hernia 4) multiple adenomatous colonic polyps. Plan -Discussed lab results with the patient . He tolerated his IV Injectafer well with no issues . Hemoglobin is up to 11.4 with a ferritin of 205 . -No acute indication for additional IV iron at this time . -We'll have him on maintenance oral iron polysaccharide 150 mg by mouth daily given anticipated ongoing GI losses in the setting of chronic anticoagulation . -He has a follow-up with Dr. Hilarie Fredrickson  and will defer to him regarding consideration of capsule endoscopy to rule out small bowel source of bleeding. -Avoid NSAIDs -Would try to keep patient's INR in the lower end of the 2-3 range and avoid overshooting his anticoagulation for atrial fibrillation. -We shall see him back in 3 months with repeat labs  to follow-up to determine if he is getting more iron deficient and anemic again and to determine if he needs any additional IV iron in addition to the oral iron maintenance .  5). Patient Active Problem List   Diagnosis Date Noted  . Iron deficiency anemia due to chronic blood loss 05/14/2016  . Barrett's esophagus without dysplasia 01/22/2016  . Anemia 08/01/2015  . Symptomatic anemia 08/01/2015  . Chronic diastolic CHF (congestive heart failure) ([place]) 08/01/2015  . Sick sinus syndrome ([place]) 03/20/2013  . Pacemaker 11/24/2011  . Warfarin anticoagulation   . Paroxysmal atrial fibrillation (HCC)   . Valvular heart disease   . Atrial flutter ([place])   . Carotid artery disease ([place])   . History of endocarditis   . HYPERLIPIDEMIA 10/10/2010  . Obstructive sleep apnea 11/01/2009  . Hypertensive cardiovascular disease 04/10/2009  . Coronary atherosclerosis of native coronary artery 04/10/2009  . Secondary cardiomyopathy ([place]) 04/10/2009   -continue f/u with cardiology and PCP as per their recommendations.   Return to care with Dr. Irene Limbo in 3 months with repeat labs   All of the patients questions were answered with apparent satisfaction. The patient knows to call the clinic with any problems, questions or concerns.  I spent 20 minutes counseling the patient face to face. The total time spent in the appointment was 25 minutes and more than 50% was on counseling and direct patient cares.    Sullivan Lone MD [place] AAHIVMS [facility] Hematology/Oncology Physician [facility]  (Office):       [phone] (Work cell):  [phone] (Fax):           [phone]  06/26/2016 12:42 PM

## 2016-07-02 ENCOUNTER — Ambulatory Visit (INDEPENDENT_AMBULATORY_CARE_PROVIDER_SITE_OTHER): Payer: Medicare Other

## 2016-07-02 ENCOUNTER — Other Ambulatory Visit: Payer: Self-pay

## 2016-07-02 DIAGNOSIS — R0602 Shortness of breath: Secondary | ICD-10-CM

## 2016-07-05 ENCOUNTER — Other Ambulatory Visit: Payer: Self-pay | Admitting: Cardiology

## 2016-07-09 NOTE — Progress Notes (Signed)
Cardiology Office Note  Date: 07/10/2016   ID: De Hollingshead, DOB 06-22-1929, MRN [id number]  PCP: Eber Hong, MD  Primary Cardiologist: Rozann Lesches, MD   Chief Complaint  Patient presents with  . Diastolic heart failure  . Valvular heart disease    History of Present Illness: [name] is a medically complex 82 y.o. male last seen in August. He is here with his wife for a follow-up visit. He tells me that he has been doing about the same, no significant change in shortness of breath and reporting good control of his leg edema. His weights have also been stable at home.  He continues to follow with Dr. Rayann Heman in the device clinic, pacemaker in place. Device function recently normal. Dr. Rayann Heman did order an echocardiogram which is outlined below. We discussed the results today. Mr. Shaak has complex valvular heart disease that we are managing conservatively at this time.  I went over his medications. He is using Lasix 80 mg in the morning and 40 mg in the evening. Sometimes he holds off on the morning dose if he has to travel or be out away from a bathroom for a long period of time.  Past Medical History:  Diagnosis Date  . Anemia   . Arthritis   . Atrial flutter (HCC)    Amiodarone therapy, coumadin therapy, prior history of cardioversion and overdrive pacing  . Barrett's esophagus   . Benign prostatic hyperplasia   . Cardiomyopathy    Mixed ischemic and nonischemic with previous EF of 35-40%, most recently in June 2009 normalized to 55-60%. Echo 2/11 EF 55-60%  . Carotid artery disease (HCC)    Less than 50% bilateral internal carotid artery stenosis by Doppler 2009  . Carpal tunnel syndrome   . Chronic diastolic (congestive) heart failure   . Coronary atherosclerosis of native coronary artery    Status post coronary bypass grafting  . 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 Endocarditis   . Essential  hypertension, benign   . Hiatal hernia   . Hyperlipidemia   . Hypothyroidism   . Internal hemorrhoids   . Iron deficiency anemia   . Obstructive sleep apnea   . Osteoporosis   . Paroxysmal atrial fibrillation (HCC)    Amiodarone therapy, status post Cox-Maze procedure but with recurrent atrial fibrillation while on amiodarone August 2012  . Symptomatic bradycardia   . Tubular adenoma of colon   . Valvular heart disease    Status post by prosthetic aortic valve and mitral valve repair  . Warfarin anticoagulation     Current Outpatient Prescriptions  Medication Sig Dispense Refill  . ALOE VERA JUICE PO Take 2 oz by mouth daily.     Marland Kitchen amiodarone (PACERONE) 200 MG tablet Take 0.5 tablets (100 mg total) by mouth daily. 45 tablet 3  . amLODipine (NORVASC) 10 MG tablet Take 0.5 tablets (5 mg total) by mouth daily. 30 tablet 6  . benazepril (LOTENSIN) 40 MG tablet TAKE 1 TABLET BY MOUTH DAILY 30 tablet 6  . carvedilol (COREG) 3.125 MG tablet TAKE 1 TABLET (3.125 MG TOTAL) BY MOUTH 2 (TWO) TIMES DAILY. 60 tablet 6  . docusate sodium (COLACE) 100 MG capsule Take 200 mg by mouth daily.     Marland Kitchen FIBER PO Take 1 packet by mouth daily. ACACIA FIBER-[employer] Brand fiber therapy    . furosemide (LASIX) 40 MG tablet TAKE 2 TABLETS BY MOUTH  IN THE MORNING AND TAKE 1 TAB EVERY EVEVING,MAY TAKE 1 EXTRA TAB IN AM. 90 tablet 1  . iron polysaccharides (NIFEREX) 150 MG capsule Take 1 capsule (150 mg total) by mouth daily. 30 capsule 2  . KLOR-CON M20 20 MEQ tablet TAKE 1 TABLET BY MOUTH DAILY 30 tablet 6  . levothyroxine (SYNTHROID, LEVOTHROID) 50 MCG tablet Take 50 mcg by mouth daily before breakfast.    . Multiple Vitamins-Minerals (THEREMS M PO) Take 1 tablet by mouth daily.      . nitroGLYCERIN (NITROSTAT) 0.4 MG SL tablet Place 1 tablet (0.4 mg total) under the tongue every 5 (five) minutes as needed. Up to 3 doses, if no relief after 3rd dose, proceed to the ED for an evaluation. 25 tablet 3  .  pantoprazole (PROTONIX) 40 MG tablet Take 1 tablet (40 mg total) by mouth daily. 90 tablet 3  . pravastatin (PRAVACHOL) 40 MG tablet TAKE 1 TABLET BY MOUTH EVERY EVENING 30 tablet 11  . Probiotic Product (PROBIOTIC DAILY PO) Take 1 tablet by mouth daily.    Marland Kitchen warfarin (COUMADIN) 2 MG tablet Take 2 mg by mouth daily. Take 2mg  every day except on mondays take 1mg      No current facility-administered medications for this visit.    Allergies:  Patient has no known allergies.   Social History: The patient  reports that he quit smoking about 61 years ago. His smoking use included Cigarettes. He started smoking about 72 years ago. He has a 2.40 pack-year smoking history. He has never used smokeless tobacco. He reports that he drinks alcohol. He reports that he does not use drugs.   ROS:  Please see the history of present illness. Otherwise, complete review of systems is positive for hearing loss.  All other systems are reviewed and negative.   Physical Exam: VS:  BP (!) 151/67   Pulse 60   Ht 5\' 6"  (1.676 m)   Wt 179 lb (81.2 kg)   BMI 28.89 kg/m , BMI Body mass index is 28.89 kg/m.  Wt Readings from Last 3 Encounters:  07/10/16 179 lb (81.2 kg)  06/03/16 178 lb (80.7 kg)  05/14/16 175 lb 9.6 oz (79.7 kg)    Elderly male in no distress. Using walker today. HEENT: Conjunctiva and lids normal, oropharynx clear.  Neck: Supple, no elevated JVP, no thyromegaly.  Lungs: Clear to auscultation, nonlabored breathing at rest.  Cardiac: Regular rate and rhythm, no S3, 3/6 systolic murmur at base and apex, paradoxically split S2, no pericardial rub.  Abdomen: Soft, nontender, protuberent, bowel sounds present, no guarding or rebound.  Extremities: Bilateral compression hose in place with 1+ pitting edema, distal pulses 1-2+.   ECG: I personally reviewed the tracing from 03/09/2016 which showed dual-chamber paced rhythm.  Recent Labwork: 08/29/2015: TSH 8.727 06/25/2016: ALT 20; AST 22; BUN  18.5; Creatinine 0.9; HGB 11.4; Platelets 119; Potassium 4.0; Sodium 140   Other Studies Reviewed Today:  Echocardiogram 07/02/2016: Study Conclusions  - Left ventricle: The cavity size was normal. Wall thickness was   increased in a pattern of severe LVH. Systolic function was   normal. The estimated ejection fraction was in the range of 55%   to 60%. - Regional wall motion abnormality: Mild hypokinesis of the   basal-mid inferior and basal-mid inferolateral myocardium. - Aortic valve: Bioprosthetic aortic valve which appears to be   functioning normally. Peak velocity (S): 244 cm/s. Mean gradient   (S): 16 mm Hg. Valve area (VTI): 2.34 cm^2.  Valve area (Vmax):   2.58 cm^2. Valve area (Vmean): 2.29 cm^2. - Mitral valve: S/p mitral annuloplasty. Mildly thickened leaflets   . There was mild regurgitation. Mean gradient (D): 4 mm Hg. Valve   area by continuity equation (using LVOT flow): 2.93 cm^2. - Left atrium: The atrium was severely dilated. - Right ventricle: The cavity size was mildly dilated. Pacer wire   or catheter noted in right ventricle. Systolic function was   mildly reduced. - Right atrium: The atrium was severely dilated. Pacer wire or   catheter noted in right atrium. - Tricuspid valve: There was moderate-severe regurgitation. - Pulmonic valve: There was mild regurgitation. - Pulmonary arteries: PA peak pressure: 58 mm Hg (S). - Systemic veins: IVC was dilated with normal respiratory   variation. Estimated CVP 8 mmHg.  Assessment and Plan:  1. Chronic diastolic heart failure, volume status has been relatively stable as has control of leg edema. No changes made to current Lasix dose. Recent echocardiogram shows LVEF 55-60%.  2. Valvular heart disease including prior bioprosthetic AVR and mitral valve repair. Recent echocardiogram shows stable AVR with mean gradient 16 mmHg and mild mitral regurgitation status post annuloplasty and repair. He does have severe tricuspid  regurgitation with reduced right ventricular contraction and moderate hypertension. This is a more chronic issue and he is doing reasonable well on diuretics.  3. Sick sinus syndrome status post pacemaker placement. Followed by Dr. Rayann Heman.  Current medicines were reviewed with the patient today.  Disposition: Follow-up in 4 months.  Signed, Satira Sark, MD, [facility] 07/10/2016 3:27 PM    [facility] [address], [place], [place] [address] Phone: [phone]; Fax: [phone]

## 2016-07-10 ENCOUNTER — Encounter: Payer: Self-pay | Admitting: Cardiology

## 2016-07-10 ENCOUNTER — Ambulatory Visit (INDEPENDENT_AMBULATORY_CARE_PROVIDER_SITE_OTHER): Payer: Medicare Other | Admitting: Cardiology

## 2016-07-10 VITALS — BP 151/67 | HR 60 | Ht 66.0 in | Wt 179.0 lb

## 2016-07-10 DIAGNOSIS — I38 Endocarditis, valve unspecified: Secondary | ICD-10-CM

## 2016-07-10 DIAGNOSIS — I5032 Chronic diastolic (congestive) heart failure: Secondary | ICD-10-CM | POA: Diagnosis not present

## 2016-07-10 DIAGNOSIS — I2510 Atherosclerotic heart disease of native coronary artery without angina pectoris: Secondary | ICD-10-CM

## 2016-07-10 DIAGNOSIS — I495 Sick sinus syndrome: Secondary | ICD-10-CM

## 2016-07-10 NOTE — Patient Instructions (Signed)

## 2016-07-20 ENCOUNTER — Other Ambulatory Visit: Payer: Self-pay | Admitting: Cardiology

## 2016-08-18 ENCOUNTER — Ambulatory Visit (INDEPENDENT_AMBULATORY_CARE_PROVIDER_SITE_OTHER): Payer: Medicare Other | Admitting: Internal Medicine

## 2016-08-18 ENCOUNTER — Encounter: Payer: Self-pay | Admitting: Internal Medicine

## 2016-08-18 VITALS — BP 136/70 | HR 60 | Ht 64.0 in | Wt 174.4 lb

## 2016-08-18 DIAGNOSIS — D500 Iron deficiency anemia secondary to blood loss (chronic): Secondary | ICD-10-CM | POA: Diagnosis not present

## 2016-08-18 DIAGNOSIS — Z86010 Personal history of colonic polyps: Secondary | ICD-10-CM | POA: Diagnosis not present

## 2016-08-18 DIAGNOSIS — K2270 Barrett's esophagus without dysplasia: Secondary | ICD-10-CM | POA: Diagnosis not present

## 2016-08-18 NOTE — Patient Instructions (Signed)
Continue your current medications  Follow up as needed.  Follow up with Dr Irene Limbo for iron deficiency anemia as currently scheduled.  If you are age 81 or older, your body mass index should be between 23-30. Your Body mass index is 29.93 kg/m. If this is out of the aforementioned range listed, please consider follow up with your Primary Care Provider.  If you are age 40 or younger, your body mass index should be between 19-25. Your Body mass index is 29.93 kg/m. If this is out of the aformentioned range listed, please consider follow up with your Primary Care Provider.

## 2016-08-18 NOTE — Progress Notes (Signed)
Subjective:    Patient ID: [name], male    DOB: [birth year], 83 y.o.   MRN: [id number]  HPI [name] is an 83 year old male with history of iron deficiency anemia likely from chronic GI blood loss, adenomatous colon polyps, Barrett's esophagus without dysplasia, GERD who is here for follow-up. He is here with his wife. He has a history of atrial fibrillation on warfarin, CAD, peripheral vascular disease and chronic systolic heart failure. He is now had IV iron replacement 2 on the direction of Dr. Irene Limbo.  Iron studies have improved as has hemoglobin. This has resulted in improving energy levels, stamina.  Overall he is feeling better. He denies current GI complaint. He seen no visible blood in his stool or melena. Denies abdominal pain. Occasionally has issues with constipation but he is using Colace, probiotic and aloe vera juice. He is taking a daily PPI. He continues on warfarin.  EGD and colonoscopy were performed in April 2017 revealing Barrett's esophagus, 3 cm hiatal hernia and normal stomach and duodenum. Duodenal biopsies normal. Colonoscopy revealed polyps which were adenomatous but not cancerous. Internal hemorrhoids were seen.  Review of Systems As per history of present illness, otherwise negative  Current Medications, Allergies, Past Medical History, Past Surgical History, Family History and Social History were reviewed in [employer] record.     Objective:   Physical Exam BP 136/70 (BP Location: Left Arm, Patient Position: Sitting, Cuff Size: Normal)   Pulse 60   Ht 5\' 4"  (1.626 m)   Wt 174 lb 6 oz (79.1 kg)   BMI 29.93 kg/m  Constitutional: Well-developed and well-nourished Elderly male in No distress. HEENT: Normocephalic and atraumatic.   No scleral icterus. Neck: Neck supple. Trachea midline. Cardiovascular:Irreg, irreg, 2/6 SEM Pulmonary/chest: Effort normal and breath sounds normal. No wheezing, rales or rhonchi. Abdominal: Soft,  nontender, nondistended. Bowel sounds active throughout.  Extremities: no clubbing, cyanosis, or edema Neurological: Alert and oriented to person place and time. Skin: Skin is warm and dry. No rashes noted. Psychiatric: Normal mood and affect. Behavior is normal.  CBC    Component Value Date/Time   WBC 5.3 06/25/2016 1201   WBC 5.5 04/10/2016 1535   RBC 4.05 (L) 06/25/2016 1201   RBC 3.54 (L) 04/10/2016 1535   HGB 11.4 (L) 06/25/2016 1201   HCT 35.5 (L) 06/25/2016 1201   PLT 119 (L) 06/25/2016 1201   MCV 87.7 06/25/2016 1201   MCH 28.1 06/25/2016 1201   MCH 21.5 (L) 08/03/2015 1810   MCHC 32.1 06/25/2016 1201   MCHC 32.7 04/10/2016 1535   RDW 19.7 (H) 06/25/2016 1201   LYMPHSABS 0.9 06/25/2016 1201   MONOABS 0.6 06/25/2016 1201   EOSABS 0.1 06/25/2016 1201   BASOSABS 0.1 06/25/2016 1201   Iron/TIBC/Ferritin/ %Sat    Component Value Date/Time   IRON 45 06/25/2016 1201   TIBC 281 06/25/2016 1201   FERRITIN 205 06/25/2016 1201   IRONPCTSAT 16 (L) 06/25/2016 1201       Assessment & Plan:  83 year old male with history of iron deficiency anemia likely from chronic GI blood loss, adenomatous colon polyps, Barrett's esophagus without dysplasia, GERD who is here for follow-up.  1. IDA, Likely from chronic, slow GI blood loss -- we discussed further GI workup including video capsule endoscopy to examine the small bowel for possible bleeding source such as angiodysplastic lesions. While this would likely answer the question, given his response to IV iron with improvement in hemoglobin, energy levels and  lack of overt bleeding, I do not think this is necessary at present. If lesions found in the small bowel the next test recommended would be deep enteroscopy which carries increased risk given his age and medical conditions specifically CHF, A. fib and the need to hold warfarin. If IV iron was ineffective at symptom improvement and anemia improvement, or should he have overt GI blood  loss, then we could/would reconsider video capsule endoscopy in the future. --I discussed this at length with he and his wife and they are in agreement with the current plan. He will follow-up with Dr. Irene Limbo as scheduled for one work and office visit in hematology on September 24, 2016.  2. Barrett's esophagus without dysplasia -- continue daily PPI. Not planning surveillance endoscopy due to age.  3. History of adenomatous colon polyps -- not planning surveillance colonoscopy due to age. He understands and agrees  Can follow with me as needed or if there is change in condition 25 minutes spent with the patient today. Greater than 50% was spent in counseling and coordination of care with the patient

## 2016-08-29 ENCOUNTER — Other Ambulatory Visit: Payer: Self-pay | Admitting: Cardiology

## 2016-09-02 ENCOUNTER — Ambulatory Visit (INDEPENDENT_AMBULATORY_CARE_PROVIDER_SITE_OTHER): Payer: Medicare Other | Admitting: *Deleted

## 2016-09-02 DIAGNOSIS — I495 Sick sinus syndrome: Secondary | ICD-10-CM

## 2016-09-02 NOTE — Progress Notes (Signed)
Remote pacemaker transmission.   

## 2016-09-04 ENCOUNTER — Encounter: Payer: Self-pay | Admitting: Cardiology

## 2016-09-04 LAB — CUP PACEART REMOTE DEVICE CHECK
Battery Impedance: 1108 Ohm
Battery Remaining Longevity: 45 mo
Battery Voltage: 2.76 V
Brady Statistic AP VP Percent: 97 %
Brady Statistic AP VS Percent: 2 %
Brady Statistic AS VP Percent: 1 %
Brady Statistic AS VS Percent: 0 %
Date Time Interrogation Session: 20180207161625
Implantable Lead Implant Date: 20120815
Implantable Lead Implant Date: 20120815
Implantable Lead Location: 753859
Implantable Lead Location: 753860
Implantable Lead Model: 5076
Implantable Lead Model: 5076
Implantable Pulse Generator Implant Date: 20120815
Lead Channel Impedance Value: 455 Ohm
Lead Channel Impedance Value: 499 Ohm
Lead Channel Pacing Threshold Amplitude: 1.25 V
Lead Channel Pacing Threshold Pulse Width: 0.4 ms
Lead Channel Setting Pacing Amplitude: 2.5 V
Lead Channel Setting Pacing Amplitude: 2.5 V
Lead Channel Setting Pacing Pulse Width: 0.4 ms
Lead Channel Setting Sensing Sensitivity: 2 mV

## 2016-09-22 ENCOUNTER — Telehealth: Payer: Self-pay | Admitting: Hematology

## 2016-09-22 NOTE — Telephone Encounter (Signed)
Spoke with patient's wife and confirmed appointment changes

## 2016-09-24 ENCOUNTER — Other Ambulatory Visit (HOSPITAL_BASED_OUTPATIENT_CLINIC_OR_DEPARTMENT_OTHER): Payer: Medicare Other

## 2016-09-24 ENCOUNTER — Encounter: Payer: Self-pay | Admitting: Hematology

## 2016-09-24 ENCOUNTER — Ambulatory Visit (HOSPITAL_BASED_OUTPATIENT_CLINIC_OR_DEPARTMENT_OTHER): Payer: Medicare Other | Admitting: Hematology

## 2016-09-24 ENCOUNTER — Encounter (INDEPENDENT_AMBULATORY_CARE_PROVIDER_SITE_OTHER): Payer: Self-pay

## 2016-09-24 ENCOUNTER — Telehealth: Payer: Self-pay | Admitting: Hematology

## 2016-09-24 VITALS — BP 138/57 | HR 18 | Temp 97.7°F | Resp 18 | Ht 64.0 in | Wt 178.8 lb

## 2016-09-24 DIAGNOSIS — K922 Gastrointestinal hemorrhage, unspecified: Secondary | ICD-10-CM | POA: Diagnosis not present

## 2016-09-24 DIAGNOSIS — D500 Iron deficiency anemia secondary to blood loss (chronic): Secondary | ICD-10-CM | POA: Diagnosis present

## 2016-09-24 LAB — IRON AND TIBC
%SAT: 12 % — ABNORMAL LOW (ref 20–55)
Iron: 44 ug/dL (ref 42–163)
TIBC: 354 ug/dL (ref 202–409)
UIBC: 310 ug/dL (ref 117–376)

## 2016-09-24 LAB — CBC & DIFF AND RETIC
BASO%: 1.5 % (ref 0.0–2.0)
Basophils Absolute: 0.1 10*3/uL (ref 0.0–0.1)
EOS%: 2.8 % (ref 0.0–7.0)
Eosinophils Absolute: 0.2 10*3/uL (ref 0.0–0.5)
HCT: 35.1 % — ABNORMAL LOW (ref 38.4–49.9)
HGB: 11.1 g/dL — ABNORMAL LOW (ref 13.0–17.1)
Immature Retic Fract: 18.5 % — ABNORMAL HIGH (ref 3.00–10.60)
LYMPH%: 14.6 % (ref 14.0–49.0)
MCH: 27.3 pg (ref 27.2–33.4)
MCHC: 31.6 g/dL — ABNORMAL LOW (ref 32.0–36.0)
MCV: 86.2 fL (ref 79.3–98.0)
MONO#: 0.9 10*3/uL (ref 0.1–0.9)
MONO%: 13.2 % (ref 0.0–14.0)
NEUT#: 4.4 10*3/uL (ref 1.5–6.5)
NEUT%: 67.9 % (ref 39.0–75.0)
Platelets: 130 10*3/uL — ABNORMAL LOW (ref 140–400)
RBC: 4.07 10*6/uL — ABNORMAL LOW (ref 4.20–5.82)
RDW: 15.9 % — ABNORMAL HIGH (ref 11.0–14.6)
Retic %: 1.35 % (ref 0.80–1.80)
Retic Ct Abs: 54.95 10*3/uL (ref 34.80–93.90)
WBC: 6.5 10*3/uL (ref 4.0–10.3)
lymph#: 0.9 10*3/uL (ref 0.9–3.3)
nRBC: 0 % (ref 0–0)

## 2016-09-24 LAB — COMPREHENSIVE METABOLIC PANEL
ALT: 13 U/L (ref 0–55)
AST: 18 U/L (ref 5–34)
Albumin: 3.8 g/dL (ref 3.5–5.0)
Alkaline Phosphatase: 103 U/L (ref 40–150)
Anion Gap: 8 mEq/L (ref 3–11)
BUN: 25.7 mg/dL (ref 7.0–26.0)
CO2: 30 mEq/L — ABNORMAL HIGH (ref 22–29)
Calcium: 9.9 mg/dL (ref 8.4–10.4)
Chloride: 106 mEq/L (ref 98–109)
Creatinine: 1.1 mg/dL (ref 0.7–1.3)
EGFR: 62 mL/min/{1.73_m2} — ABNORMAL LOW (ref >90)
Glucose: 88 mg/dl (ref 70–140)
Potassium: 4.5 mEq/L (ref 3.5–5.1)
Sodium: 143 mEq/L (ref 136–145)
Total Bilirubin: 0.46 mg/dL (ref 0.20–1.20)
Total Protein: 6.7 g/dL (ref 6.4–8.3)

## 2016-09-24 LAB — FERRITIN: Ferritin: 40 ng/ml (ref 22–316)

## 2016-09-24 MED ORDER — POLYSACCHARIDE IRON COMPLEX 150 MG PO CAPS: 150.0000 mg | ORAL_CAPSULE | Freq: Every day | ORAL | 5 refills | Status: DC

## 2016-09-24 NOTE — Telephone Encounter (Signed)
Gave patient avs report and appointments for August - lab/fu/time frame confirmed with desk nurse. Los not complete for 3/1 visit upon check out.

## 2016-10-26 ENCOUNTER — Other Ambulatory Visit: Payer: Self-pay | Admitting: Internal Medicine

## 2016-10-26 DIAGNOSIS — K2270 Barrett's esophagus without dysplasia: Secondary | ICD-10-CM

## 2016-11-03 ENCOUNTER — Encounter: Payer: Self-pay | Admitting: Cardiology

## 2016-11-03 ENCOUNTER — Ambulatory Visit (INDEPENDENT_AMBULATORY_CARE_PROVIDER_SITE_OTHER): Payer: Medicare Other | Admitting: Cardiology

## 2016-11-03 VITALS — BP 135/65 | HR 61 | Ht 65.5 in | Wt 181.0 lb

## 2016-11-03 DIAGNOSIS — I5032 Chronic diastolic (congestive) heart failure: Secondary | ICD-10-CM

## 2016-11-03 DIAGNOSIS — I495 Sick sinus syndrome: Secondary | ICD-10-CM

## 2016-11-03 DIAGNOSIS — I480 Paroxysmal atrial fibrillation: Secondary | ICD-10-CM | POA: Diagnosis not present

## 2016-11-03 DIAGNOSIS — I38 Endocarditis, valve unspecified: Secondary | ICD-10-CM

## 2016-11-03 NOTE — Progress Notes (Signed)
Cardiology Office Note  Date: 11/03/2016   ID: [name], DOB 1929/03/19, MRN [id number]  PCP: Eber Hong, MD  Primary Cardiologist: Rozann Lesches, MD   Chief Complaint  Patient presents with  . Diastolic heart failure    History of Present Illness: [name] is a medically complex 83 y.o. male last seen in December 2017.He is here today with his wife for a follow-up visit. Overall, reports no major change in functional capacity which is limited at baseline. Still using a rolling walker. He denies any falls.  He continues regular follow-up in the device clinic with Dr. Rayann Heman, [employer] pacemaker in place. Last interrogation showed overall normal device function with less than 0.1% mode switching.  Current medications are outlined below. He remains on Lasix at 80 mg in the morning and 40 mg in the evening. He states that he has not had to use any extra doses.  Recent lab work is outlined below. He has had stable renal function.  Past Medical History:  Diagnosis Date  . Anemia   . Arthritis   . Atrial flutter (HCC)    Amiodarone therapy, coumadin therapy, prior history of cardioversion and overdrive pacing  . Barrett's esophagus   . Benign prostatic hyperplasia   . Cardiomyopathy    Mixed ischemic and nonischemic with previous EF of 35-40%, most recently in June 2009 normalized to 55-60%. Echo 2/11 EF 55-60%  . Carotid artery disease (HCC)    Less than 50% bilateral internal carotid artery stenosis by Doppler 2009  . Carpal tunnel syndrome   . Chronic diastolic (congestive) heart failure   . Coronary atherosclerosis of native coronary artery    Status post coronary bypass grafting  . 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 Endocarditis   . Essential hypertension, benign   . Hiatal hernia   . Hyperlipidemia   . Hypothyroidism   . Internal hemorrhoids   . Iron deficiency anemia   . Obstructive sleep apnea    . Osteoporosis   . Paroxysmal atrial fibrillation (HCC)    Amiodarone therapy, status post Cox-Maze procedure but with recurrent atrial fibrillation while on amiodarone August 2012  . Symptomatic bradycardia   . Tubular adenoma of colon   . Valvular heart disease    Status post by prosthetic aortic valve and mitral valve repair  . Warfarin anticoagulation     Past Surgical History:  Procedure Laterality Date  . AORTIC VALVE REPLACEMENT    . CARPAL TUNNEL RELEASE  2009  . CATARACT EXTRACTION, BILATERAL  07/2011  . CORONARY ARTERY BYPASS GRAFT  2008   x2  . HERNIA REPAIR  age 8  . KNEE ARTHROSCOPY    . PACEMAKER INSERTION  2009   Initially implanted at [facility], extracted by Dr Lovena Le for endocarditis and reimplanted 8/12  . PATELLA FRACTURE SURGERY  age 24   screw placed and removed  . TONSILLECTOMY  1939  . TOTAL HIP ARTHROPLASTY      Current Outpatient Prescriptions  Medication Sig Dispense Refill  . ALOE VERA JUICE PO Take 2 oz by mouth daily.     Marland Kitchen amiodarone (PACERONE) 200 MG tablet Take 0.5 tablets (100 mg total) by mouth daily. 45 tablet 3  . amLODipine (NORVASC) 10 MG tablet Take 0.5 tablets (5 mg total) by mouth daily. 30 tablet 6  . benazepril (LOTENSIN) 40 MG tablet TAKE 1 TABLET BY MOUTH DAILY 30 tablet 6  . carvedilol (  COREG) 3.125 MG tablet TAKE 1 TABLET (3.125 MG TOTAL) BY MOUTH 2 (TWO) TIMES DAILY. 60 tablet 6  . docusate sodium (COLACE) 100 MG capsule Take 200 mg by mouth daily.     Marland Kitchen FIBER PO Take 1 packet by mouth daily. ACACIA FIBER-[employer] Brand fiber therapy    . furosemide (LASIX) 40 MG tablet TAKE 2 TABLETS BY MOUTH IN THE MORNING AND TAKE 1 TAB EVERY EVEVING,MAY TAKE 1 EXTRA TAB IN AM. 90 tablet 2  . iron polysaccharides (NIFEREX) 150 MG capsule Take 1 capsule (150 mg total) by mouth daily. 30 capsule 5  . KLOR-CON M20 20 MEQ tablet TAKE 1 TABLET BY MOUTH DAILY 30 tablet 6  . levothyroxine (SYNTHROID, LEVOTHROID) 50 MCG tablet Take 50 mcg by mouth  daily before breakfast.    . Multiple Vitamins-Minerals (THEREMS M PO) Take 1 tablet by mouth daily.      . nitroGLYCERIN (NITROSTAT) 0.4 MG SL tablet Place 1 tablet (0.4 mg total) under the tongue every 5 (five) minutes as needed. Up to 3 doses, if no relief after 3rd dose, proceed to the ED for an evaluation. 25 tablet 3  . pantoprazole (PROTONIX) 40 MG tablet TAKE 1 TABLET BY MOUTH DAILY. 90 tablet 3  . pravastatin (PRAVACHOL) 40 MG tablet TAKE 1 TABLET BY MOUTH EVERY EVENING 30 tablet 11  . Probiotic Product (PROBIOTIC DAILY PO) Take 1 tablet by mouth daily.    Marland Kitchen warfarin (COUMADIN) 2 MG tablet Take 2 mg by mouth daily. Take 2mg  every day except on mondays take 1mg      No current facility-administered medications for this visit.    Allergies:  Patient has no known allergies.   Social History: The patient  reports that he quit smoking about 62 years ago. His smoking use included Cigarettes. He started smoking about 72 years ago. He has a 2.40 pack-year smoking history. He has never used smokeless tobacco. He reports that he drinks alcohol. He reports that he does not use drugs.   ROS:  Please see the history of present illness. Otherwise, complete review of systems is positive for hearing loss.  All other systems are reviewed and negative.   Physical Exam: VS:  BP 135/65 (BP Location: Left Arm, Patient Position: Sitting, Cuff Size: Normal)   Pulse 61   Ht 5' 5.5" (1.664 m)   Wt 181 lb (82.1 kg)   SpO2 96%   BMI 29.66 kg/m , BMI Body mass index is 29.66 kg/m.  Wt Readings from Last 3 Encounters:  11/03/16 181 lb (82.1 kg)  09/24/16 178 lb 12.8 oz (81.1 kg)  08/18/16 174 lb 6 oz (79.1 kg)    Elderly male in no distress. Using walker today. HEENT: Conjunctiva and lids normal, oropharynx clear.  Neck: Supple, no elevated JVP, no thyromegaly.  Lungs: Clear to auscultation, nonlabored breathing at rest.  Cardiac: Regular rate and rhythm, no S3, 3/6 systolic murmur at base and apex,  paradoxically split S2, no pericardial rub.  Abdomen: Soft, nontender, protuberent, bowel sounds present, no guarding or rebound.  Extremities: Bilateral compression hose in place with 1+ pitting edema, distal pulses 1-2+.  ECG: I personally reviewed the tracing from 03/09/2016 which showed dual-chamber pacing.  Recent Labwork: 09/24/2016: ALT 13; AST 18; BUN 25.7; Creatinine 1.1; HGB 11.1; Platelets 130; Potassium 4.5; Sodium 143  February 2018: TSH 5.32 down from 9.43, potassium 3.9, BUN 17, creatinine 1.0  Other Studies Reviewed Today:  Echocardiogram 07/02/2016: Study Conclusions  - Left ventricle:  The cavity size was normal. Wall thickness was   increased in a pattern of severe LVH. Systolic function was   normal. The estimated ejection fraction was in the range of 55%   to 60%. - Regional wall motion abnormality: Mild hypokinesis of the   basal-mid inferior and basal-mid inferolateral myocardium. - Aortic valve: Bioprosthetic aortic valve which appears to be   functioning normally. Peak velocity (S): 244 cm/s. Mean gradient   (S): 16 mm Hg. Valve area (VTI): 2.34 cm^2. Valve area (Vmax):   2.58 cm^2. Valve area (Vmean): 2.29 cm^2. - Mitral valve: S/p mitral annuloplasty. Mildly thickened leaflets   . There was mild regurgitation. Mean gradient (D): 4 mm Hg. Valve   area by continuity equation (using LVOT flow): 2.93 cm^2. - Left atrium: The atrium was severely dilated. - Right ventricle: The cavity size was mildly dilated. Pacer wire   or catheter noted in right ventricle. Systolic function was   mildly reduced. - Right atrium: The atrium was severely dilated. Pacer wire or   catheter noted in right atrium. - Tricuspid valve: There was moderate-severe regurgitation. - Pulmonic valve: There was mild regurgitation. - Pulmonary arteries: PA peak pressure: 58 mm Hg (S). - Systemic veins: IVC was dilated with normal respiratory   variation. Estimated CVP 8 mmHg.  Assessment  and Plan:  1. Chronic diastolic heart failure. Remains clinically stable on current diuretic regimen which will continue for now. Still using compression stockings with good control of leg edema.  2. Valvular heart disease with bioprosthetic AVR and mitral valve repair. No change on examination in terms of heart murmurs. He did have a recent echocardiogram as outlined above. Continue observation for now.  3. Sick sinus syndrome status post pacemaker placement. Keep follow-up with Dr. Rayann Heman in the device clinic.  4. Paroxysmal atrial fibrillation, well-controlled on amiodarone. Recent lab work reviewed. He remains on Coumadin.  Current medicines were reviewed with the patient today.  Disposition: Follow-up in 3 months.  Signed, Satira Sark, MD, [facility] 11/03/2016 11:20 AM    [facility] [place], [place], [place] [address] Phone: [phone]; Fax: [phone]

## 2016-11-03 NOTE — Patient Instructions (Signed)
Medication Instructions:  Continue all current medications.  Labwork: none  Testing/Procedures: none  Follow-Up: 3 months   Any Other Special Instructions Will Be Listed Below (If Applicable).  If you need a refill on your cardiac medications before your next appointment, please call your pharmacy.  

## 2016-11-16 ENCOUNTER — Other Ambulatory Visit: Payer: Self-pay | Admitting: *Deleted

## 2016-11-16 MED ORDER — POLYSACCHARIDE IRON COMPLEX 150 MG PO CAPS: 150.0000 mg | ORAL_CAPSULE | Freq: Every day | ORAL | 0 refills | Status: DC

## 2016-11-21 ENCOUNTER — Other Ambulatory Visit: Payer: Self-pay | Admitting: Cardiology

## 2016-11-30 ENCOUNTER — Other Ambulatory Visit: Payer: Self-pay | Admitting: *Deleted

## 2016-11-30 DIAGNOSIS — D500 Iron deficiency anemia secondary to blood loss (chronic): Secondary | ICD-10-CM

## 2016-11-30 NOTE — Progress Notes (Signed)
[name] Kitchen    HEMATOLOGY/ONCOLOGY CLINIC NOTE  Date of Service: .09/24/2016  Patient Care Team: Eber Hong, MD as PCP - General (Internal Medicine) Zenovia Jarred MD (Gastroenterology)  CHIEF COMPLAINTS/PURPOSE OF CONSULTATION:  Iron deficiency anemia  HISTORY OF PRESENTING ILLNESS:   [name] is a wonderful 83 y.o. male who has been referred to [place] by Dr .Eber Hong, MD  for evaluation and management of Iron deficiency Anemia  He has a history of multiple medical comorbidities including coronary artery disease, chronic systolic CHF, atrial fibrillation on Coumadin, peripheral vascular disease, permanent pacemaker placement, adenomatous colonic polyps and long segment Barrett's esophagus without significant dysplasia on chronic PPI therapy.  Patient has been noting increasing fatigue from earlier this year and was noted to have anemia with hemoglobin of 7.8 with an MCV of 69 in January 2017. Ferritin level was noted to be 19 with an iron saturation of 3%. Fecal occult blood testing in March was noted to be positive for occult GI bleeding.  Patient received IV Feraheme in March and July 2017 for iron replacement. He had an upper endoscopy and colonoscopy which were performed on 11/12/2015. EGD revealed 4 cm of Barrett's esophagus which was biopsied and found to be Barrett's without dysplasia. There was a 3 cm hiatal hernia. Normal stomach and normal duodenum. Duodenal biopsies were negative for celiac. Colonoscopy performed on the same day revealed 2 cecal polyps. 3 ascending colon polyps one of which was 14 mm and removed with hot snare. It was prophylactically endoclipped. The remainder of the examination was unremarkable. Internal hemorrhoids were seen. These polyps were found to be adenomas without high-grade dysplasia. Has not had a capsule endoscopy.  Patient's hemoglobin levels have been somewhat better in the mid to high 9 range but with persistent iron deficiency and lack of  normalization of his anemia.  He has been referred to [place] for further management of his iron deficiency anemia. He continues to be on Coumadin for his atrial fibrillation. He has been on oral iron preparation which is causing some constipation and darker stools. Reports no overt GI bleeding. His last INR was 2.3 yesterday as per his report. He monitors this with his primary care physician. Notes persistent fatigue which is likely multifactorial.  Is on levothyroxine replacement for hypothyroidism. Takes a daily multivitamin.  Patient reports that he notes his weight closely to avoid fluid overload from CHF and notes no weight loss.  No other evidence of overt bleeding. No hematuria no gum bleeds no epistaxis. Notes easy skin bruising. Not using any over-the-counter NSAIDs.  INTERVAL HISTORY  Mr [name] Is here for his scheduled 3 month follow-up. Notes his energy levels and been okay. No overt GI bleeding noted. Hemoglobin has remained relatively stable at 11.1. He continues to be on Coumadin at this time for anticoagulation for atrial fibrillation. His ferritin levels are trending down and her daughter from 205 to 40 suggesting ongoing GI losses. Had a follow-up with Dr. Peri Jefferson in January of this year.  MEDICAL HISTORY:  Past Medical History:  Diagnosis Date  . Anemia   . Arthritis   . Atrial flutter (HCC)    Amiodarone therapy, coumadin therapy, prior history of cardioversion and overdrive pacing  . Barrett's esophagus   . Benign prostatic hyperplasia   . Cardiomyopathy    Mixed ischemic and nonischemic with previous EF of 35-40%, most recently in June 2009 normalized to 55-60%. Echo 2/11 EF 55-60%  . Carotid artery disease (HCC)    Less  than 50% bilateral internal carotid artery stenosis by Doppler 2009  . Carpal tunnel syndrome   . Chronic diastolic (congestive) heart failure ([place])   . Coronary atherosclerosis of native coronary artery    Status post coronary bypass  grafting  . 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 Endocarditis   . Essential hypertension, benign   . Hiatal hernia   . Hyperlipidemia   . Hypothyroidism   . Internal hemorrhoids   . Iron deficiency anemia   . Obstructive sleep apnea   . Osteoporosis   . Paroxysmal atrial fibrillation (HCC)    Amiodarone therapy, status post Cox-Maze procedure but with recurrent atrial fibrillation while on amiodarone August 2012  . Symptomatic bradycardia   . Tubular adenoma of colon   . Valvular heart disease    Status post by prosthetic aortic valve and mitral valve repair  . Warfarin anticoagulation     SURGICAL HISTORY: Past Surgical History:  Procedure Laterality Date  . AORTIC VALVE REPLACEMENT    . CARPAL TUNNEL RELEASE  2009  . CATARACT EXTRACTION, BILATERAL  07/2011  . CORONARY ARTERY BYPASS GRAFT  2008   x2  . HERNIA REPAIR  age 48  . KNEE ARTHROSCOPY    . PACEMAKER INSERTION  2009   Initially implanted at [facility], extracted by Dr Lovena Le for endocarditis and reimplanted 8/12  . PATELLA FRACTURE SURGERY  age 89   screw placed and removed  . TONSILLECTOMY  1939  . TOTAL HIP ARTHROPLASTY      SOCIAL HISTORY: Social History   Social History  . Marital status: Married    Spouse name: N/A  . Number of children: 3  . Years of education: N/A   Occupational History  . Retired    Social History Main Topics  . Smoking status: Former Smoker    Packs/day: 0.30    Years: 8.00    Types: Cigarettes    Start date: 03/25/1944    Quit date: 07/27/1954  . Smokeless tobacco: Never Used  . Alcohol use 0.0 oz/week     Comment: glass of wine occ  . Drug use: No  . Sexual activity: Not on file   Other Topics Concern  . Not on file   Social History Narrative  . No narrative on file    FAMILY HISTORY: Family History  Problem Relation Age of Onset  . Coronary artery disease Father   . Other Father     Heart attack or stroke    . Cancer Father   . Colon cancer Mother   . Diabetes Neg Hx   . Hypertension Neg Hx   . Esophageal cancer Neg Hx   . Rectal cancer Neg Hx   . Stomach cancer Neg Hx     ALLERGIES:  has No Known Allergies.  MEDICATIONS:  Current Outpatient Prescriptions  Medication Sig Dispense Refill  . ALOE VERA JUICE PO Take 2 oz by mouth daily.     [name] Kitchen amiodarone (PACERONE) 200 MG tablet Take 0.5 tablets (100 mg total) by mouth daily. 45 tablet 3  . amLODipine (NORVASC) 10 MG tablet Take 0.5 tablets (5 mg total) by mouth daily. 30 tablet 6  . carvedilol (COREG) 3.125 MG tablet TAKE 1 TABLET (3.125 MG TOTAL) BY MOUTH 2 (TWO) TIMES DAILY. 60 tablet 6  . docusate sodium (COLACE) 100 MG capsule Take 200 mg by mouth daily.     [name] Kitchen FIBER PO Take 1 packet by  mouth daily. ACACIA FIBER-[employer] Brand fiber therapy    . furosemide (LASIX) 40 MG tablet TAKE 2 TABLETS BY MOUTH IN THE MORNING AND TAKE 1 TAB EVERY EVEVING,MAY TAKE 1 EXTRA TAB IN AM. 90 tablet 2  . KLOR-CON M20 20 MEQ tablet TAKE 1 TABLET BY MOUTH DAILY 30 tablet 6  . levothyroxine (SYNTHROID, LEVOTHROID) 50 MCG tablet Take 50 mcg by mouth daily before breakfast.    . Multiple Vitamins-Minerals (THEREMS M PO) Take 1 tablet by mouth daily.      . nitroGLYCERIN (NITROSTAT) 0.4 MG SL tablet Place 1 tablet (0.4 mg total) under the tongue every 5 (five) minutes as needed. Up to 3 doses, if no relief after 3rd dose, proceed to the ED for an evaluation. 25 tablet 3  . pravastatin (PRAVACHOL) 40 MG tablet TAKE 1 TABLET BY MOUTH EVERY EVENING 30 tablet 11  . Probiotic Product (PROBIOTIC DAILY PO) Take 1 tablet by mouth daily.    [name] Kitchen warfarin (COUMADIN) 2 MG tablet Take 2 mg by mouth daily. Take 2mg  every day except on mondays take 1mg     . benazepril (LOTENSIN) 40 MG tablet TAKE 1 TABLET BY MOUTH DAILY 30 tablet 6  . iron polysaccharides (NIFEREX) 150 MG capsule Take 1 capsule (150 mg total) by mouth daily. 90 capsule 0  . pantoprazole (PROTONIX) 40 MG tablet  TAKE 1 TABLET BY MOUTH DAILY. 90 tablet 3   No current facility-administered medications for this visit.     REVIEW OF SYSTEMS:    10 Point review of Systems was done is negative except as noted above.  PHYSICAL EXAMINATION: ECOG PERFORMANCE STATUS: 3 - Symptomatic, >50% confined to bed  . Vitals:   09/24/16 1054  BP: (!) 138/57  Pulse: (!) 18  Resp: 18  Temp: 97.7 F (36.5 C)   Filed Weights   09/24/16 1054  Weight: 178 lb 12.8 oz (81.1 kg)   .Body mass index is 30.69 kg/m.  GENERAL:alert, in no acute distress and comfortable SKIN: skin color, texture, turgor are normal, no rashes or significant lesions EYES: normal, conjunctiva are pink and non-injected, sclera clear OROPHARYNX:no exudate, no erythema and lips, buccal mucosa, and tongue normal  NECK: supple, no JVD, thyroid normal size, non-tender, without nodularity LYMPH:  no palpable lymphadenopathy in the cervical, axillary or inguinal LUNGS: clear to auscultation with normal respiratory effort HEART: regular rate & rhythm,  no murmurs and no lower extremity edema ABDOMEN: abdomen soft, non-tender, normoactive bowel sounds  Musculoskeletal: no cyanosis of digits and no clubbing  PSYCH: alert & oriented x 3 with fluent speech NEURO: no focal motor/sensory deficits  LABORATORY DATA:  I have reviewed the data as listed  . CBC Latest Ref Rng & Units 09/24/2016 06/25/2016 05/14/2016  WBC 4.0 - 10.3 10e3/uL 6.5 5.3 4.7  Hemoglobin 13.0 - 17.1 g/dL 11.1(L) 11.4(L) 11.1(L)  Hematocrit 38.4 - 49.9 % 35.1(L) 35.5(L) 35.1(L)  Platelets 140 - 400 10e3/uL 130(L) 119(L) 142   . CBC    Component Value Date/Time   WBC 6.5 09/24/2016 1040   WBC 5.5 04/10/2016 1535   RBC 4.07 (L) 09/24/2016 1040   RBC 3.54 (L) 04/10/2016 1535   HGB 11.1 (L) 09/24/2016 1040   HCT 35.1 (L) 09/24/2016 1040   PLT 130 (L) 09/24/2016 1040   MCV 86.2 09/24/2016 1040   MCH 27.3 09/24/2016 1040   MCH 21.5 (L) 08/03/2015 1810   MCHC 31.6 (L)  09/24/2016 1040   MCHC 32.7 04/10/2016 1535  RDW 15.9 (H) 09/24/2016 1040   LYMPHSABS 0.9 09/24/2016 1040   MONOABS 0.9 09/24/2016 1040   EOSABS 0.2 09/24/2016 1040   BASOSABS 0.1 09/24/2016 1040   . CMP Latest Ref Rng & Units 09/24/2016 06/25/2016 05/14/2016  Glucose 70 - 140 mg/dl 88 98 93  BUN 7.0 - 26.0 mg/dL 25.7 18.5 17.7  Creatinine 0.7 - 1.3 mg/dL 1.1 0.9 1.0  Sodium 136 - 145 mEq/L 143 140 144  Potassium 3.5 - 5.1 mEq/L 4.5 4.0 3.5  Chloride 98 - 110 mmol/L - - -  CO2 22 - 29 mEq/L 30(H) 24 26  Calcium 8.4 - 10.4 mg/dL 9.9 9.2 9.6  Total Protein 6.4 - 8.3 g/dL 6.7 6.7 7.0  Total Bilirubin 0.20 - 1.20 mg/dL 0.46 0.50 0.44  Alkaline Phos 40 - 150 U/L 103 125 110  AST 5 - 34 U/L 18 22 19   ALT 0 - 55 U/L 13 20 13    . Lab Results  Component Value Date   IRON 44 09/24/2016   TIBC 354 09/24/2016   IRONPCTSAT 12 (L) 09/24/2016   (Iron and TIBC)  Lab Results  Component Value Date   FERRITIN 40 09/24/2016   EGD 11/12/2015 - Esophageal mucosal changes suggestive of long-segment Barrett's esophagus. Biopsied. - 3 cm hiatal hernia. - Normal stomach. - Normal examined duodenum. Biopsied.  Colonoscopy 11/12/2015 - Two 4 to 5 mm polyps in the cecum, removed with a cold snare. Resected and retrieved. - Two 4 to 5 mm polyps in the ascending colon, removed with a cold snare. Resected and Retrieved - One 14 mm polyp in the ascending colon, removed with a hot snare. Resected and retrieved. Clip was placed. - Internal hemorrhoids.   RADIOGRAPHIC STUDIES: I have personally reviewed the radiological images as listed and agreed with the findings in the report. No results found.  ASSESSMENT & PLAN:   83 year old Caucasian male with multiple medical comorbidities with  1) Microcytic Anemia -predominantly related to iron deficiency from chronic GI losses.  Patient has been on chronic anticoagulation for his atrial fibrillation and this will continue to be a risk factor from  going GI losses. He has received IV Feraheme 2 in March and July 2017 without complete correction of his iron deficiency. This is suggestive of ongoing GI losses. Initial workup as noted below showed no monoclonal protein. No evidence of hemolysis with normal LDH and haptoglobin. Patient had IV Injectafer 750mg  qweekly x2 doses on 05/22/2016 and 05/29/2016 .  Patient's hemoglobin is relatively stable at 11.1 ferritin is trending down from 205 to 40 suggesting ongoing GI losses. He has multiple potential sources of GI bleeding especially when on Coumadin.  2) long segment Barrett's esophagus without dysplasia. 3) hiatal hernia 4) multiple adenomatous colonic polyps. Plan -Discussed lab results with the patient .  -He would like to try maintenance oral iron polysaccharide 150 mg by mouth daily given anticipated ongoing GI losses in the setting of chronic anticoagulation . -Avoid NSAIDs -Would try to keep patient's INR in the lower end of the 2-3 range and avoid overshooting his anticoagulation for atrial fibrillation.If ongoing GI losses more significant than can be controlled with IV iron replacement might need to reconsider pros and cons of ongoing anticoagulation .  5). Patient Active Problem List   Diagnosis Date Noted  . Iron deficiency anemia due to chronic blood loss 05/14/2016  . Barrett's esophagus without dysplasia 01/22/2016  . Anemia 08/01/2015  . Symptomatic anemia 08/01/2015  . Chronic diastolic CHF (congestive  heart failure) ([place]) 08/01/2015  . Sick sinus syndrome ([place]) 03/20/2013  . Pacemaker 11/24/2011  . Warfarin anticoagulation   . Paroxysmal atrial fibrillation (HCC)   . Valvular heart disease   . Atrial flutter ([place])   . Carotid artery disease ([place])   . History of endocarditis   . HYPERLIPIDEMIA 10/10/2010  . Obstructive sleep apnea 11/01/2009  . Hypertensive cardiovascular disease 04/10/2009  . Coronary atherosclerosis of native coronary artery 04/10/2009  .  Secondary cardiomyopathy ([place]) 04/10/2009   -continue f/u with cardiology and PCP as per their recommendations.   Return to care with Dr. Irene Limbo in 2 months with repeat labs   All of the patients questions were answered with apparent satisfaction. The patient knows to call the clinic with any problems, questions or concerns.  I spent 20 minutes counseling the patient face to face. The total time spent in the appointment was 20 minutes and more than 50% was on counseling and direct patient cares.    Sullivan Lone MD Nora AAHIVMS [facility] Hematology/Oncology Physician [facility]  (Office):       [phone] (Work cell):  [phone] (Fax):           [phone]

## 2016-12-01 ENCOUNTER — Telehealth: Payer: Self-pay | Admitting: Hematology

## 2016-12-01 NOTE — Telephone Encounter (Signed)
Scheduled appt per schedule message from [facility] 5/7 - patient is aware of appt date and time.

## 2016-12-02 ENCOUNTER — Telehealth: Payer: Self-pay | Admitting: Cardiology

## 2016-12-02 ENCOUNTER — Ambulatory Visit (INDEPENDENT_AMBULATORY_CARE_PROVIDER_SITE_OTHER): Payer: Medicare Other | Admitting: *Deleted

## 2016-12-02 DIAGNOSIS — I495 Sick sinus syndrome: Secondary | ICD-10-CM | POA: Diagnosis not present

## 2016-12-02 NOTE — Telephone Encounter (Signed)
Spoke with pt and reminded pt of remote transmission that is due today. Pt verbalized understanding.   

## 2016-12-03 NOTE — Progress Notes (Signed)
Remote pacemaker transmission.   

## 2016-12-04 ENCOUNTER — Encounter: Payer: Self-pay | Admitting: Cardiology

## 2016-12-04 LAB — CUP PACEART REMOTE DEVICE CHECK
Battery Impedance: 1162 Ohm
Battery Remaining Longevity: 43 mo
Battery Voltage: 2.77 V
Brady Statistic AP VP Percent: 98 %
Brady Statistic AP VS Percent: 2 %
Brady Statistic AS VP Percent: 0 %
Brady Statistic AS VS Percent: 0 %
Date Time Interrogation Session: 20180509194307
Implantable Lead Implant Date: 20120815
Implantable Lead Implant Date: 20120815
Implantable Lead Location: 753859
Implantable Lead Location: 753860
Implantable Lead Model: 5076
Implantable Lead Model: 5076
Implantable Pulse Generator Implant Date: 20120815
Lead Channel Impedance Value: 408 Ohm
Lead Channel Impedance Value: 448 Ohm
Lead Channel Pacing Threshold Amplitude: 1.625 V
Lead Channel Pacing Threshold Pulse Width: 0.4 ms
Lead Channel Setting Pacing Amplitude: 2.5 V
Lead Channel Setting Pacing Amplitude: 2.5 V
Lead Channel Setting Pacing Pulse Width: 0.4 ms
Lead Channel Setting Sensing Sensitivity: 2 mV

## 2016-12-15 ENCOUNTER — Encounter: Payer: Self-pay | Admitting: Hematology

## 2016-12-15 ENCOUNTER — Telehealth: Payer: Self-pay | Admitting: Hematology

## 2016-12-15 ENCOUNTER — Ambulatory Visit (HOSPITAL_BASED_OUTPATIENT_CLINIC_OR_DEPARTMENT_OTHER): Payer: Medicare Other | Admitting: Hematology

## 2016-12-15 ENCOUNTER — Other Ambulatory Visit (HOSPITAL_BASED_OUTPATIENT_CLINIC_OR_DEPARTMENT_OTHER): Payer: Medicare Other

## 2016-12-15 VITALS — BP 118/52 | HR 56 | Temp 98.0°F | Resp 18 | Ht 65.5 in | Wt 181.7 lb

## 2016-12-15 DIAGNOSIS — Z7901 Long term (current) use of anticoagulants: Secondary | ICD-10-CM

## 2016-12-15 DIAGNOSIS — D500 Iron deficiency anemia secondary to blood loss (chronic): Secondary | ICD-10-CM

## 2016-12-15 DIAGNOSIS — E538 Deficiency of other specified B group vitamins: Secondary | ICD-10-CM

## 2016-12-15 LAB — COMPREHENSIVE METABOLIC PANEL
ALT: 18 U/L (ref 0–55)
AST: 20 U/L (ref 5–34)
Albumin: 3.7 g/dL (ref 3.5–5.0)
Alkaline Phosphatase: 96 U/L (ref 40–150)
Anion Gap: 8 mEq/L (ref 3–11)
BUN: 20 mg/dL (ref 7.0–26.0)
CO2: 27 mEq/L (ref 22–29)
Calcium: 9.4 mg/dL (ref 8.4–10.4)
Chloride: 107 mEq/L (ref 98–109)
Creatinine: 1.3 mg/dL (ref 0.7–1.3)
EGFR: 51 mL/min/{1.73_m2} — ABNORMAL LOW (ref >90)
Glucose: 95 mg/dl (ref 70–140)
Potassium: 4.4 mEq/L (ref 3.5–5.1)
Sodium: 142 mEq/L (ref 136–145)
Total Bilirubin: 0.62 mg/dL (ref 0.20–1.20)
Total Protein: 6.5 g/dL (ref 6.4–8.3)

## 2016-12-15 LAB — CBC WITH DIFFERENTIAL/PLATELET
BASO%: 2.1 % — ABNORMAL HIGH (ref 0.0–2.0)
Basophils Absolute: 0.2 10*3/uL — ABNORMAL HIGH (ref 0.0–0.1)
EOS%: 2.3 % (ref 0.0–7.0)
Eosinophils Absolute: 0.2 10*3/uL (ref 0.0–0.5)
HCT: 28.3 % — ABNORMAL LOW (ref 38.4–49.9)
HGB: 8.8 g/dL — ABNORMAL LOW (ref 13.0–17.1)
LYMPH%: 11.8 % — ABNORMAL LOW (ref 14.0–49.0)
MCH: 23.9 pg — ABNORMAL LOW (ref 27.2–33.4)
MCHC: 30.9 g/dL — ABNORMAL LOW (ref 32.0–36.0)
MCV: 77.3 fL — ABNORMAL LOW (ref 79.3–98.0)
MONO#: 0.8 10*3/uL (ref 0.1–0.9)
MONO%: 11.2 % (ref 0.0–14.0)
NEUT#: 5.3 10*3/uL (ref 1.5–6.5)
NEUT%: 72.6 % (ref 39.0–75.0)
Platelets: 178 10*3/uL (ref 140–400)
RBC: 3.67 10*6/uL — ABNORMAL LOW (ref 4.20–5.82)
RDW: 18.1 % — ABNORMAL HIGH (ref 11.0–14.6)
WBC: 7.3 10*3/uL (ref 4.0–10.3)
lymph#: 0.9 10*3/uL (ref 0.9–3.3)

## 2016-12-15 LAB — IRON AND TIBC
%SAT: 5 % — ABNORMAL LOW (ref 20–55)
Iron: 20 ug/dL — ABNORMAL LOW (ref 42–163)
TIBC: 421 ug/dL — ABNORMAL HIGH (ref 202–409)
UIBC: 402 ug/dL — ABNORMAL HIGH (ref 117–376)

## 2016-12-15 LAB — FERRITIN: Ferritin: 29 ng/ml (ref 22–316)

## 2016-12-15 NOTE — Patient Instructions (Signed)
Thank you for choosing [facility] to provide your oncology and hematology care.  To afford each patient quality time with our providers, please arrive 30 minutes before your scheduled appointment time.  If you arrive late for your appointment, you may be asked to reschedule.  We strive to give you quality time with our providers, and arriving late affects you and other patients whose appointments are after yours.   If you are a no show for multiple scheduled visits, you may be dismissed from the clinic at the providers discretion.    Again, thank you for choosing [facility], our hope is that these requests will decrease the amount of time that you wait before being seen by our physicians.  ______________________________________________________________________  Should you have questions after your visit to the  Cancer Center, please contact our office at [phone] between the hours of 8:30 and 4:30 p.m.    Voicemails left after 4:30p.m will not be returned until the following business day.    For prescription refill requests, please have your pharmacy contact us directly.  Please also try to allow 48 hours for prescription requests.    Please contact the scheduling department for questions regarding scheduling.  For scheduling of procedures such as PET scans, CT scans, MRI, Ultrasound, etc please contact central scheduling at [phone].    Resources For Cancer Patients and Caregivers:   Oncolink.org:  A wonderful resource for patients and healthcare providers for information regarding your disease, ways to tract your treatment, what to expect, etc.     American Cancer Society:  800-227-2345  Can help patients locate various types of support and financial assistance  Cancer Care: 1-800-813-HOPE (4673) Provides financial assistance, online support groups, medication/co-pay assistance.    [place] DSS:  336-641-3447 Where to apply for food  stamps, Medicaid, and utility assistance  Medicare Rights Center: 800-333-4114 Helps people with Medicare understand their rights and benefits, navigate the Medicare system, and secure the quality healthcare they deserve  SCAT: 336-333-6589 [place] [employer]'s shared-ride transportation service for eligible riders who have a disability that prevents them from riding the fixed route bus.    For additional information on assistance programs please contact our social worker:   Grier Hock/Abigail Elmore:  [phone]            

## 2016-12-15 NOTE — Telephone Encounter (Signed)
Appts scheduled already per 5/22 LOS

## 2016-12-15 NOTE — Telephone Encounter (Signed)
Gave patient avs report and appointments for May thru July. °

## 2016-12-16 NOTE — Progress Notes (Signed)
Marland Kitchen    HEMATOLOGY/ONCOLOGY CLINIC NOTE  Date of Service: .12/15/2016  Patient Care Team: Eber Hong, MD as PCP - General (Internal Medicine) Zenovia Jarred MD (Gastroenterology)  CHIEF COMPLAINTS/PURPOSE OF CONSULTATION:  Iron deficiency anemia  HISTORY OF PRESENTING ILLNESS:   [name] is a wonderful 83 y.o. male who has been referred to [place] by Dr .Eber Hong, MD  for evaluation and management of Iron deficiency Anemia  He has a history of multiple medical comorbidities including coronary artery disease, chronic systolic CHF, atrial fibrillation on Coumadin, peripheral vascular disease, permanent pacemaker placement, adenomatous colonic polyps and long segment Barrett's esophagus without significant dysplasia on chronic PPI therapy.  Patient has been noting increasing fatigue from earlier this year and was noted to have anemia with hemoglobin of 7.8 with an MCV of 69 in January 2017. Ferritin level was noted to be 19 with an iron saturation of 3%. Fecal occult blood testing in March was noted to be positive for occult GI bleeding.  Patient received IV Feraheme in March and July 2017 for iron replacement. He had an upper endoscopy and colonoscopy which were performed on 11/12/2015. EGD revealed 4 cm of Barrett's esophagus which was biopsied and found to be Barrett's without dysplasia. There was a 3 cm hiatal hernia. Normal stomach and normal duodenum. Duodenal biopsies were negative for celiac. Colonoscopy performed on the same day revealed 2 cecal polyps. 3 ascending colon polyps one of which was 14 mm and removed with hot snare. It was prophylactically endoclipped. The remainder of the examination was unremarkable. Internal hemorrhoids were seen. These polyps were found to be adenomas without high-grade dysplasia. Has not had a capsule endoscopy.  Patient's hemoglobin levels have been somewhat better in the mid to high 9 range but with persistent iron deficiency and lack of  normalization of his anemia.  He has been referred to [place] for further management of his iron deficiency anemia. He continues to be on Coumadin for his atrial fibrillation. He has been on oral iron preparation which is causing some constipation and darker stools. Reports no overt GI bleeding. His last INR was 2.3 yesterday as per his report. He monitors this with his primary care physician. Notes persistent fatigue which is likely multifactorial.  Is on levothyroxine replacement for hypothyroidism. Takes a daily multivitamin.  Patient reports that he notes his weight closely to avoid fluid overload from CHF and notes no weight loss.  No other evidence of overt bleeding. No hematuria no gum bleeds no epistaxis. Notes easy skin bruising. Not using any over-the-counter NSAIDs.  INTERVAL HISTORY  [name] Is here for his scheduled 2 month follow-up.  No overt GI bleeding noted. Notes that he has been compliant with his oral iron polysaccharide 150 mg by mouth daily. His hemoglobin has drifted down to 8.8 with an MCV of 77 suggesting ongoing GI losses. He continues to be on Coumadin at this time for anticoagulation for atrial fibrillation. His ferritin levels are trending down from 205 to 40 and now to 29 suggesting ongoing GI losses. We discussed and he is agreeable to proceed with additional IV iron. He was recommended to discuss the risk versus benefits of ongoing anticoagulation with his cardiologist and gastroenterologist.   MEDICAL HISTORY:  Past Medical History:  Diagnosis Date  . Anemia   . Arthritis   . Atrial flutter (HCC)    Amiodarone therapy, coumadin therapy, prior history of cardioversion and overdrive pacing  . Barrett's esophagus   . Benign prostatic  hyperplasia   . Cardiomyopathy    Mixed ischemic and nonischemic with previous EF of 35-40%, most recently in June 2009 normalized to 55-60%. Echo 2/11 EF 55-60%  . Carotid artery disease (HCC)    Less than 50% bilateral  internal carotid artery stenosis by Doppler 2009  . Carpal tunnel syndrome   . Chronic diastolic (congestive) heart failure ([place])   . Coronary atherosclerosis of native coronary artery    Status post coronary bypass grafting  . 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 Endocarditis   . Essential hypertension, benign   . Hiatal hernia   . Hyperlipidemia   . Hypothyroidism   . Internal hemorrhoids   . Iron deficiency anemia   . Obstructive sleep apnea   . Osteoporosis   . Paroxysmal atrial fibrillation (HCC)    Amiodarone therapy, status post Cox-Maze procedure but with recurrent atrial fibrillation while on amiodarone August 2012  . Symptomatic bradycardia   . Tubular adenoma of colon   . Valvular heart disease    Status post by prosthetic aortic valve and mitral valve repair  . Warfarin anticoagulation     SURGICAL HISTORY: Past Surgical History:  Procedure Laterality Date  . AORTIC VALVE REPLACEMENT    . CARPAL TUNNEL RELEASE  2009  . CATARACT EXTRACTION, BILATERAL  07/2011  . CORONARY ARTERY BYPASS GRAFT  2008   x2  . HERNIA REPAIR  age 39  . KNEE ARTHROSCOPY    . PACEMAKER INSERTION  2009   Initially implanted at [facility], extracted by Dr Lovena Le for endocarditis and reimplanted 8/12  . PATELLA FRACTURE SURGERY  age 54   screw placed and removed  . TONSILLECTOMY  1939  . TOTAL HIP ARTHROPLASTY      SOCIAL HISTORY: Social History   Social History  . Marital status: Married    Spouse name: N/A  . Number of children: 3  . Years of education: N/A   Occupational History  . Retired    Social History Main Topics  . Smoking status: Former Smoker    Packs/day: 0.30    Years: 8.00    Types: Cigarettes    Start date: 03/25/1944    Quit date: 07/27/1954  . Smokeless tobacco: Never Used  . Alcohol use 0.0 oz/week     Comment: glass of wine occ  . Drug use: No  . Sexual activity: Not on file   Other Topics Concern    . Not on file   Social History Narrative  . No narrative on file    FAMILY HISTORY: Family History  Problem Relation Age of Onset  . Coronary artery disease Father   . Other Father        Heart attack or stroke  . Cancer Father   . Colon cancer Mother   . Diabetes Neg Hx   . Hypertension Neg Hx   . Esophageal cancer Neg Hx   . Rectal cancer Neg Hx   . Stomach cancer Neg Hx     ALLERGIES:  has No Known Allergies.  MEDICATIONS:  Current Outpatient Prescriptions  Medication Sig Dispense Refill  . ALOE VERA JUICE PO Take 2 oz by mouth daily.     Marland Kitchen amiodarone (PACERONE) 200 MG tablet Take 0.5 tablets (100 mg total) by mouth daily. 45 tablet 3  . amLODipine (NORVASC) 10 MG tablet Take 0.5 tablets (5 mg total) by mouth daily. 30 tablet 6  . benazepril (LOTENSIN) 40  MG tablet TAKE 1 TABLET BY MOUTH DAILY 30 tablet 6  . carvedilol (COREG) 3.125 MG tablet TAKE 1 TABLET (3.125 MG TOTAL) BY MOUTH 2 (TWO) TIMES DAILY. 60 tablet 6  . docusate sodium (COLACE) 100 MG capsule Take 200 mg by mouth daily.     Marland Kitchen FIBER PO Take 1 packet by mouth daily. ACACIA FIBER-[employer] Brand fiber therapy    . furosemide (LASIX) 40 MG tablet TAKE 2 TABLETS BY MOUTH IN THE MORNING AND TAKE 1 TAB EVERY EVEVING,MAY TAKE 1 EXTRA TAB IN AM. 90 tablet 2  . iron polysaccharides (NIFEREX) 150 MG capsule Take 1 capsule (150 mg total) by mouth daily. 90 capsule 0  . KLOR-CON M20 20 MEQ tablet TAKE 1 TABLET BY MOUTH DAILY 30 tablet 6  . levothyroxine (SYNTHROID, LEVOTHROID) 50 MCG tablet Take 50 mcg by mouth daily before breakfast.    . Multiple Vitamins-Minerals (THEREMS M PO) Take 1 tablet by mouth daily.      . nitroGLYCERIN (NITROSTAT) 0.4 MG SL tablet Place 1 tablet (0.4 mg total) under the tongue every 5 (five) minutes as needed. Up to 3 doses, if no relief after 3rd dose, proceed to the ED for an evaluation. 25 tablet 3  . pantoprazole (PROTONIX) 40 MG tablet TAKE 1 TABLET BY MOUTH DAILY. 90 tablet 3  .  pravastatin (PRAVACHOL) 40 MG tablet TAKE 1 TABLET BY MOUTH EVERY EVENING 30 tablet 11  . Probiotic Product (PROBIOTIC DAILY PO) Take 1 tablet by mouth daily.    Marland Kitchen warfarin (COUMADIN) 2 MG tablet Take 2 mg by mouth daily. Take 2mg  every day except on mondays take 1mg      No current facility-administered medications for this visit.     REVIEW OF SYSTEMS:    10 Point review of Systems was done is negative except as noted above.  PHYSICAL EXAMINATION: ECOG PERFORMANCE STATUS: 3 - Symptomatic, >50% confined to bed  . Vitals:   12/15/16 1416  BP: (!) 118/52  Pulse: (!) 56  Resp: 18  Temp: 98 F (36.7 C)   Filed Weights   12/15/16 1416  Weight: 181 lb 11.2 oz (82.4 kg)   .Body mass index is 29.78 kg/m.  GENERAL:alert, in no acute distress and comfortable SKIN: skin color, texture, turgor are normal, no rashes or significant lesions EYES: normal, conjunctiva are pink and non-injected, sclera clear OROPHARYNX:no exudate, no erythema and lips, buccal mucosa, and tongue normal  NECK: supple, no JVD, thyroid normal size, non-tender, without nodularity LYMPH:  no palpable lymphadenopathy in the cervical, axillary or inguinal LUNGS: clear to auscultation with normal respiratory effort HEART: regular rate & rhythm,  no murmurs and no lower extremity edema ABDOMEN: abdomen soft, non-tender, normoactive bowel sounds  Musculoskeletal: no cyanosis of digits and no clubbing  PSYCH: alert & oriented x 3 with fluent speech NEURO: no focal motor/sensory deficits  LABORATORY DATA:  I have reviewed the data as listed  . CBC Latest Ref Rng & Units 12/15/2016 09/24/2016 06/25/2016  WBC 4.0 - 10.3 10e3/uL 7.3 6.5 5.3  Hemoglobin 13.0 - 17.1 g/dL 8.8(L) 11.1(L) 11.4(L)  Hematocrit 38.4 - 49.9 % 28.3(L) 35.1(L) 35.5(L)  Platelets 140 - 400 10e3/uL 178 130(L) 119(L)   . CBC    Component Value Date/Time   WBC 7.3 12/15/2016 1331   WBC 5.5 04/10/2016 1535   RBC 3.67 (L) 12/15/2016 1331   RBC  3.54 (L) 04/10/2016 1535   HGB 8.8 (L) 12/15/2016 1331   HCT 28.3 (L) 12/15/2016  1331   PLT 178 12/15/2016 1331   MCV 77.3 (L) 12/15/2016 1331   MCH 23.9 (L) 12/15/2016 1331   MCH 21.5 (L) 08/03/2015 1810   MCHC 30.9 (L) 12/15/2016 1331   MCHC 32.7 04/10/2016 1535   RDW 18.1 (H) 12/15/2016 1331   LYMPHSABS 0.9 12/15/2016 1331   MONOABS 0.8 12/15/2016 1331   EOSABS 0.2 12/15/2016 1331   BASOSABS 0.2 (H) 12/15/2016 1331   . CMP Latest Ref Rng & Units 12/15/2016 09/24/2016 06/25/2016  Glucose 70 - 140 mg/dl 95 88 98  BUN 7.0 - 26.0 mg/dL 20.0 25.7 18.5  Creatinine 0.7 - 1.3 mg/dL 1.3 1.1 0.9  Sodium 136 - 145 mEq/L 142 143 140  Potassium 3.5 - 5.1 mEq/L 4.4 4.5 4.0  Chloride 98 - 110 mmol/L - - -  CO2 22 - 29 mEq/L 27 30(H) 24  Calcium 8.4 - 10.4 mg/dL 9.4 9.9 9.2  Total Protein 6.4 - 8.3 g/dL 6.5 6.7 6.7  Total Bilirubin 0.20 - 1.20 mg/dL 0.62 0.46 0.50  Alkaline Phos 40 - 150 U/L 96 103 125  AST 5 - 34 U/L 20 18 22   ALT 0 - 55 U/L 18 13 20    . Lab Results  Component Value Date   IRON 20 (L) 12/15/2016   TIBC 421 (H) 12/15/2016   IRONPCTSAT 5 (L) 12/15/2016   (Iron and TIBC)  Lab Results  Component Value Date   FERRITIN 29 12/15/2016   EGD 11/12/2015 - Esophageal mucosal changes suggestive of long-segment Barrett's esophagus. Biopsied. - 3 cm hiatal hernia. - Normal stomach. - Normal examined duodenum. Biopsied.  Colonoscopy 11/12/2015 - Two 4 to 5 mm polyps in the cecum, removed with a cold snare. Resected and retrieved. - Two 4 to 5 mm polyps in the ascending colon, removed with a cold snare. Resected and Retrieved - One 14 mm polyp in the ascending colon, removed with a hot snare. Resected and retrieved. Clip was placed. - Internal hemorrhoids.   RADIOGRAPHIC STUDIES: I have personally reviewed the radiological images as listed and agreed with the findings in the report. No results found.  ASSESSMENT & PLAN:   83 year old Caucasian male with multiple  medical comorbidities with  1) Microcytic Anemia -predominantly related to iron deficiency from chronic GI losses.  Patient has been on chronic anticoagulation for his atrial fibrillation and this will continue to be a risk factor from going GI losses. He has received IV Feraheme 2 in March and July 2017 without complete correction of his iron deficiency. This is suggestive of ongoing GI losses. Initial workup as noted below showed no monoclonal protein. No evidence of hemolysis with normal LDH and haptoglobin. Patient had IV Injectafer 750mg  qweekly x2 doses on 05/22/2016 and 05/29/2016 .  Patient's hemoglobin has trended down trending down from 205 to 40 to 29 despite po iron replacement suggesting ongoing GI losses. He has multiple potential sources of GI bleeding especially when on Coumadin.  2) long segment Barrett's esophagus without dysplasia. 3) hiatal hernia 4) multiple adenomatous colonic polyps. Plan -Discussed lab results with the patient and his wife in details. -He appears to have ongoing GI losses in the setting of active anticoagulation. -His iron deficiency is not being adequately addressed with oral iron at this time. -I counseled him on the need for IV iron and he is agreeable to this. -We will schedule him for IV INjectafer 750mg  weekly x 2 doses -He could continue maintenance oral iron polysaccharide 150 mg by mouth daily at  this time. -Avoid NSAIDs -Patient was counseled to have a discussion with his gastroenterologist, primary care physician and cardiologist regarding the risk versus benefit of ongoing anticoagulation in the setting of ongoing GI bleeding.  5). Patient Active Problem List   Diagnosis Date Noted  . Iron deficiency anemia due to chronic blood loss 05/14/2016  . Barrett's esophagus without dysplasia 01/22/2016  . Anemia 08/01/2015  . Symptomatic anemia 08/01/2015  . Chronic diastolic CHF (congestive heart failure) ([place]) 08/01/2015  . Sick sinus  syndrome ([place]) 03/20/2013  . Pacemaker 11/24/2011  . Warfarin anticoagulation   . Paroxysmal atrial fibrillation (HCC)   . Valvular heart disease   . Atrial flutter ([place])   . Carotid artery disease ([place])   . History of endocarditis   . HYPERLIPIDEMIA 10/10/2010  . Obstructive sleep apnea 11/01/2009  . Hypertensive cardiovascular disease 04/10/2009  . Coronary atherosclerosis of native coronary artery 04/10/2009  . Secondary cardiomyopathy ([place]) 04/10/2009   -continue f/u with cardiology and PCP as per their recommendations.   IV Injectafer weekly x 2 doses RTC with Dr Irene Limbo in 2 months with labs  All of the patients questions were answered with apparent satisfaction. The patient knows to call the clinic with any problems, questions or concerns.  I spent 20 minutes counseling the patient face to face. The total time spent in the appointment was 25 minutes and more than 50% was on counseling and direct patient cares.    Sullivan Lone MD [place] AAHIVMS [facility] Hematology/Oncology Physician [facility]  (Office):       [phone] (Work cell):  [phone] (Fax):           [phone]

## 2016-12-24 ENCOUNTER — Ambulatory Visit (HOSPITAL_BASED_OUTPATIENT_CLINIC_OR_DEPARTMENT_OTHER): Payer: Medicare Other

## 2016-12-24 VITALS — BP 138/67 | HR 61 | Temp 98.0°F | Resp 18

## 2016-12-24 DIAGNOSIS — D500 Iron deficiency anemia secondary to blood loss (chronic): Secondary | ICD-10-CM

## 2016-12-24 MED ORDER — ACETAMINOPHEN 325 MG PO TABS
ORAL_TABLET | ORAL | Status: AC
  Filled 2016-12-24: qty 2

## 2016-12-24 MED ORDER — SODIUM CHLORIDE 0.9 % IV SOLN
750.0000 mg | Freq: Once | INTRAVENOUS | Status: AC
  Administered 2016-12-24: 750 mg via INTRAVENOUS
  Filled 2016-12-24: qty 15

## 2016-12-24 MED ORDER — ACETAMINOPHEN 325 MG PO TABS
650.0000 mg | ORAL_TABLET | Freq: Once | ORAL | Status: AC
  Administered 2016-12-24: 650 mg via ORAL

## 2016-12-24 NOTE — Patient Instructions (Signed)

## 2016-12-26 ENCOUNTER — Other Ambulatory Visit: Payer: Self-pay | Admitting: Internal Medicine

## 2016-12-31 ENCOUNTER — Ambulatory Visit (HOSPITAL_BASED_OUTPATIENT_CLINIC_OR_DEPARTMENT_OTHER): Payer: Medicare Other

## 2016-12-31 VITALS — BP 127/59 | HR 59 | Temp 98.1°F | Resp 18

## 2016-12-31 DIAGNOSIS — D500 Iron deficiency anemia secondary to blood loss (chronic): Secondary | ICD-10-CM

## 2016-12-31 MED ORDER — ACETAMINOPHEN 325 MG PO TABS
ORAL_TABLET | ORAL | Status: AC
  Filled 2016-12-31: qty 2

## 2016-12-31 MED ORDER — ACETAMINOPHEN 325 MG PO TABS
650.0000 mg | ORAL_TABLET | Freq: Once | ORAL | Status: AC
  Administered 2016-12-31: 650 mg via ORAL

## 2016-12-31 MED ORDER — SODIUM CHLORIDE 0.9 % IV SOLN
750.0000 mg | Freq: Once | INTRAVENOUS | Status: AC
  Administered 2016-12-31: 750 mg via INTRAVENOUS
  Filled 2016-12-31: qty 15

## 2016-12-31 NOTE — Patient Instructions (Signed)

## 2017-01-09 ENCOUNTER — Other Ambulatory Visit: Payer: Self-pay | Admitting: Cardiology

## 2017-01-21 ENCOUNTER — Other Ambulatory Visit: Payer: Self-pay | Admitting: Cardiology

## 2017-02-09 ENCOUNTER — Ambulatory Visit (HOSPITAL_BASED_OUTPATIENT_CLINIC_OR_DEPARTMENT_OTHER): Payer: Medicare Other | Admitting: Hematology

## 2017-02-09 ENCOUNTER — Telehealth: Payer: Self-pay | Admitting: Hematology

## 2017-02-09 ENCOUNTER — Encounter: Payer: Self-pay | Admitting: Hematology

## 2017-02-09 ENCOUNTER — Other Ambulatory Visit (HOSPITAL_BASED_OUTPATIENT_CLINIC_OR_DEPARTMENT_OTHER): Payer: Medicare Other

## 2017-02-09 VITALS — BP 147/70 | HR 59 | Temp 98.0°F | Resp 16 | Ht 65.5 in | Wt 177.8 lb

## 2017-02-09 DIAGNOSIS — D500 Iron deficiency anemia secondary to blood loss (chronic): Secondary | ICD-10-CM

## 2017-02-09 DIAGNOSIS — D126 Benign neoplasm of colon, unspecified: Secondary | ICD-10-CM | POA: Diagnosis not present

## 2017-02-09 DIAGNOSIS — E538 Deficiency of other specified B group vitamins: Secondary | ICD-10-CM

## 2017-02-09 DIAGNOSIS — Z7901 Long term (current) use of anticoagulants: Secondary | ICD-10-CM | POA: Diagnosis not present

## 2017-02-09 DIAGNOSIS — K2270 Barrett's esophagus without dysplasia: Secondary | ICD-10-CM | POA: Diagnosis not present

## 2017-02-09 DIAGNOSIS — I4891 Unspecified atrial fibrillation: Secondary | ICD-10-CM

## 2017-02-09 DIAGNOSIS — K922 Gastrointestinal hemorrhage, unspecified: Secondary | ICD-10-CM

## 2017-02-09 DIAGNOSIS — K449 Diaphragmatic hernia without obstruction or gangrene: Secondary | ICD-10-CM | POA: Diagnosis not present

## 2017-02-09 LAB — COMPREHENSIVE METABOLIC PANEL
ALT: 14 U/L (ref 0–55)
AST: 20 U/L (ref 5–34)
Albumin: 3.7 g/dL (ref 3.5–5.0)
Alkaline Phosphatase: 117 U/L (ref 40–150)
Anion Gap: 10 mEq/L (ref 3–11)
BUN: 16.2 mg/dL (ref 7.0–26.0)
CO2: 27 mEq/L (ref 22–29)
Calcium: 9.4 mg/dL (ref 8.4–10.4)
Chloride: 104 mEq/L (ref 98–109)
Creatinine: 1.1 mg/dL (ref 0.7–1.3)
EGFR: 61 mL/min/{1.73_m2} — ABNORMAL LOW (ref >90)
Glucose: 94 mg/dl (ref 70–140)
Potassium: 4.3 mEq/L (ref 3.5–5.1)
Sodium: 142 mEq/L (ref 136–145)
Total Bilirubin: 0.47 mg/dL (ref 0.20–1.20)
Total Protein: 6.5 g/dL (ref 6.4–8.3)

## 2017-02-09 LAB — CBC & DIFF AND RETIC
BASO%: 2.2 % — ABNORMAL HIGH (ref 0.0–2.0)
Basophils Absolute: 0.1 10*3/uL (ref 0.0–0.1)
EOS%: 3.8 % (ref 0.0–7.0)
Eosinophils Absolute: 0.2 10*3/uL (ref 0.0–0.5)
HCT: 34.2 % — ABNORMAL LOW (ref 38.4–49.9)
HGB: 10.5 g/dL — ABNORMAL LOW (ref 13.0–17.1)
Immature Retic Fract: 12.3 % — ABNORMAL HIGH (ref 3.00–10.60)
LYMPH%: 18.9 % (ref 14.0–49.0)
MCH: 26.3 pg — ABNORMAL LOW (ref 27.2–33.4)
MCHC: 30.7 g/dL — ABNORMAL LOW (ref 32.0–36.0)
MCV: 85.5 fL (ref 79.3–98.0)
MONO#: 0.6 10*3/uL (ref 0.1–0.9)
MONO%: 11.2 % (ref 0.0–14.0)
NEUT#: 3.2 10*3/uL (ref 1.5–6.5)
NEUT%: 63.9 % (ref 39.0–75.0)
Platelets: 126 10*3/uL — ABNORMAL LOW (ref 140–400)
RBC: 4 10*6/uL — ABNORMAL LOW (ref 4.20–5.82)
RDW: 23.4 % — ABNORMAL HIGH (ref 11.0–14.6)
Retic %: 1.19 % (ref 0.80–1.80)
Retic Ct Abs: 47.6 10*3/uL (ref 34.80–93.90)
WBC: 5 10*3/uL (ref 4.0–10.3)
lymph#: 1 10*3/uL (ref 0.9–3.3)
nRBC: 0 % (ref 0–0)

## 2017-02-09 LAB — IRON AND TIBC
%SAT: 12 % — ABNORMAL LOW (ref 20–55)
Iron: 37 ug/dL — ABNORMAL LOW (ref 42–163)
TIBC: 315 ug/dL (ref 202–409)
UIBC: 278 ug/dL (ref 117–376)

## 2017-02-09 LAB — FERRITIN: Ferritin: 79 ng/ml (ref 22–316)

## 2017-02-09 NOTE — Telephone Encounter (Signed)
Scheduled appt per 7/17 los - Gave patient AVS and calender per los.  

## 2017-02-09 NOTE — Patient Instructions (Signed)
Thank you for choosing [facility] to provide your oncology and hematology care.  To afford each patient quality time with our providers, please arrive 30 minutes before your scheduled appointment time.  If you arrive late for your appointment, you may be asked to reschedule.  We strive to give you quality time with our providers, and arriving late affects you and other patients whose appointments are after yours.   If you are a no show for multiple scheduled visits, you may be dismissed from the clinic at the providers discretion.    Again, thank you for choosing [facility], our hope is that these requests will decrease the amount of time that you wait before being seen by our physicians.  ______________________________________________________________________  Should you have questions after your visit to the [facility], please contact our office at [phone] between the hours of 8:30 and 4:30 p.m.    Voicemails left after 4:30p.m will not be returned until the following business day.    For prescription refill requests, please have your pharmacy contact us directly.  Please also try to allow 48 hours for prescription requests.    Please contact the scheduling department for questions regarding scheduling.  For scheduling of procedures such as PET scans, CT scans, MRI, Ultrasound, etc please contact central scheduling at [phone].    Resources For Cancer Patients and Caregivers:   Oncolink.org:  A wonderful resource for patients and healthcare providers for information regarding your disease, ways to tract your treatment, what to expect, etc.     American Cancer Society:  800-227-2345  Can help patients locate various types of support and financial assistance  Cancer Care: 1-800-813-HOPE (4673) Provides financial assistance, online support groups, medication/co-pay assistance.    [place] DSS:  336-641-3447 Where to apply for food  stamps, Medicaid, and utility assistance  Medicare Rights Center: 800-333-4114 Helps people with Medicare understand their rights and benefits, navigate the Medicare system, and secure the quality healthcare they deserve  SCAT: 336-333-6589 [employer] [employer]'s shared-ride transportation service for eligible riders who have a disability that prevents them from riding the fixed route bus.    For additional information on assistance programs please contact our social worker:   Grier Hock/Abigail Elmore:  [phone]            

## 2017-02-10 ENCOUNTER — Other Ambulatory Visit (HOSPITAL_BASED_OUTPATIENT_CLINIC_OR_DEPARTMENT_OTHER): Payer: Self-pay

## 2017-02-10 DIAGNOSIS — Z9989 Dependence on other enabling machines and devices: Principal | ICD-10-CM

## 2017-02-10 DIAGNOSIS — G4733 Obstructive sleep apnea (adult) (pediatric): Secondary | ICD-10-CM

## 2017-02-10 LAB — VITAMIN B12: Vitamin B12: 1021 pg/mL (ref 232–1245)

## 2017-02-14 ENCOUNTER — Other Ambulatory Visit: Payer: Self-pay | Admitting: Hematology

## 2017-02-14 NOTE — Progress Notes (Signed)
Marland Kitchen    HEMATOLOGY/ONCOLOGY CLINIC NOTE  Date of Service: .02/09/2017  Patient Care Team: Eber Hong, MD as PCP - General (Internal Medicine) Zenovia Jarred MD (Gastroenterology)  CHIEF COMPLAINTS/PURPOSE OF CONSULTATION:  Iron deficiency anemia  HISTORY OF PRESENTING ILLNESS:   [name] is a wonderful 83 y.o. male who has been referred to [place] by Dr .Eber Hong, MD  for evaluation and management of Iron deficiency Anemia  He has a history of multiple medical comorbidities including coronary artery disease, chronic systolic CHF, atrial fibrillation on Coumadin, peripheral vascular disease, permanent pacemaker placement, adenomatous colonic polyps and long segment Barrett's esophagus without significant dysplasia on chronic PPI therapy.  Patient has been noting increasing fatigue from earlier this year and was noted to have anemia with hemoglobin of 7.8 with an MCV of 69 in January 2017. Ferritin level was noted to be 19 with an iron saturation of 3%. Fecal occult blood testing in March was noted to be positive for occult GI bleeding.  Patient received IV Feraheme in March and July 2017 for iron replacement. He had an upper endoscopy and colonoscopy which were performed on 11/12/2015. EGD revealed 4 cm of Barrett's esophagus which was biopsied and found to be Barrett's without dysplasia. There was a 3 cm hiatal hernia. Normal stomach and normal duodenum. Duodenal biopsies were negative for celiac. Colonoscopy performed on the same day revealed 2 cecal polyps. 3 ascending colon polyps one of which was 14 mm and removed with hot snare. It was prophylactically endoclipped. The remainder of the examination was unremarkable. Internal hemorrhoids were seen. These polyps were found to be adenomas without high-grade dysplasia. Has not had a capsule endoscopy.  Patient's hemoglobin levels have been somewhat better in the mid to high 9 range but with persistent iron deficiency and lack of  normalization of his anemia.  He has been referred to [place] for further management of his iron deficiency anemia. He continues to be on Coumadin for his atrial fibrillation. He has been on oral iron preparation which is causing some constipation and darker stools. Reports no overt GI bleeding. His last INR was 2.3 yesterday as per his report. He monitors this with his primary care physician. Notes persistent fatigue which is likely multifactorial.  Is on levothyroxine replacement for hypothyroidism. Takes a daily multivitamin.  Patient reports that he notes his weight closely to avoid fluid overload from CHF and notes no weight loss.  No other evidence of overt bleeding. No hematuria no gum bleeds no epistaxis. Notes easy skin bruising. Not using any over-the-counter NSAIDs.  INTERVAL HISTORY  [name] Is here for his scheduled followup for Iron deficiency anemia.  No overt GI bleeding noted.  His hemoglobin has improved from 8.8 up to 10.5. With some improvement in his ferritin level. He notes he feels better. His hemoglobin and ferritin level is not at goal yet.  He continues to be on Coumadin at this time for anticoagulation for atrial fibrillation.   MEDICAL HISTORY:  Past Medical History:  Diagnosis Date  . Anemia   . Arthritis   . Atrial flutter (HCC)    Amiodarone therapy, coumadin therapy, prior history of cardioversion and overdrive pacing  . Barrett's esophagus   . Benign prostatic hyperplasia   . Cardiomyopathy    Mixed ischemic and nonischemic with previous EF of 35-40%, most recently in June 2009 normalized to 55-60%. Echo 2/11 EF 55-60%  . Carotid artery disease (HCC)    Less than 50% bilateral internal carotid  artery stenosis by Doppler 2009  . Carpal tunnel syndrome   . Chronic diastolic (congestive) heart failure ([place])   . Coronary atherosclerosis of native coronary artery    Status post coronary bypass grafting  . Endocarditis    s/p PPM extraction by Dr  Lovena Le 2012 endocarditis with enterococcus faecalis  . Endocarditis   . Essential hypertension, benign   . Hiatal hernia   . Hyperlipidemia   . Hypothyroidism   . Internal hemorrhoids   . Iron deficiency anemia   . Obstructive sleep apnea   . Osteoporosis   . Paroxysmal atrial fibrillation (HCC)    Amiodarone therapy, status post Cox-Maze procedure but with recurrent atrial fibrillation while on amiodarone August 2012  . Symptomatic bradycardia   . Tubular adenoma of colon   . Valvular heart disease    Status post by prosthetic aortic valve and mitral valve repair  . Warfarin anticoagulation     SURGICAL HISTORY: Past Surgical History:  Procedure Laterality Date  . AORTIC VALVE REPLACEMENT    . CARPAL TUNNEL RELEASE  2009  . CATARACT EXTRACTION, BILATERAL  07/2011  . CORONARY ARTERY BYPASS GRAFT  2008   x2  . HERNIA REPAIR  age 62  . KNEE ARTHROSCOPY    . PACEMAKER INSERTION  2009   Initially implanted at [facility], extracted by Dr Lovena Le for endocarditis and reimplanted 8/12  . PATELLA FRACTURE SURGERY  age 50   screw placed and removed  . TONSILLECTOMY  1939  . TOTAL HIP ARTHROPLASTY      SOCIAL HISTORY: Social History   Social History  . Marital status: Married    Spouse name: N/A  . Number of children: 3  . Years of education: N/A   Occupational History  . Retired    Social History Main Topics  . Smoking status: Former Smoker    Packs/day: 0.30    Years: 8.00    Types: Cigarettes    Start date: 03/25/1944    Quit date: 07/27/1954  . Smokeless tobacco: Never Used  . Alcohol use 0.0 oz/week     Comment: glass of wine occ  . Drug use: No  . Sexual activity: Not on file   Other Topics Concern  . Not on file   Social History Narrative  . No narrative on file    FAMILY HISTORY: Family History  Problem Relation Age of Onset  . Coronary artery disease Father   . Other Father        Heart attack or stroke  . Cancer Father   . Colon cancer Mother   .  Diabetes Neg Hx   . Hypertension Neg Hx   . Esophageal cancer Neg Hx   . Rectal cancer Neg Hx   . Stomach cancer Neg Hx     ALLERGIES:  has No Known Allergies.  MEDICATIONS:  Current Outpatient Prescriptions  Medication Sig Dispense Refill  . ALOE VERA JUICE PO Take 2 oz by mouth daily.     Marland Kitchen amiodarone (PACERONE) 200 MG tablet Take 0.5 tablets (100 mg total) by mouth daily. 45 tablet 3  . amLODipine (NORVASC) 10 MG tablet Take 0.5 tablets (5 mg total) by mouth daily. 30 tablet 6  . benazepril (LOTENSIN) 40 MG tablet TAKE 1 TABLET BY MOUTH DAILY 30 tablet 6  . carvedilol (COREG) 3.125 MG tablet TAKE 1 TABLET (3.125 MG TOTAL) BY MOUTH 2 (TWO) TIMES DAILY. 60 tablet 6  . docusate sodium (COLACE) 100 MG capsule Take 200 mg by  mouth daily.     Marland Kitchen FIBER PO Take 1 packet by mouth daily. ACACIA FIBER-[employer] Brand fiber therapy    . furosemide (LASIX) 40 MG tablet TAKE 2 TABLETS BY MOUTH IN THE MORNING AND TAKE 1 TAB EVERY EVEVING,MAY TAKE 1 EXTRA TAB IN AM. 90 tablet 2  . KLOR-CON M20 20 MEQ tablet TAKE 1 TABLET BY MOUTH DAILY 30 tablet 6  . levothyroxine (SYNTHROID, LEVOTHROID) 50 MCG tablet Take 50 mcg by mouth daily before breakfast.    . Multiple Vitamins-Minerals (THEREMS M PO) Take 1 tablet by mouth daily.      . nitroGLYCERIN (NITROSTAT) 0.4 MG SL tablet Place 1 tablet (0.4 mg total) under the tongue every 5 (five) minutes as needed. Up to 3 doses, if no relief after 3rd dose, proceed to the ED for an evaluation. 25 tablet 3  . pantoprazole (PROTONIX) 40 MG tablet TAKE 1 TABLET BY MOUTH DAILY. 90 tablet 3  . pravastatin (PRAVACHOL) 40 MG tablet TAKE 1 TABLET BY MOUTH IN THE EVENING 90 tablet 2  . Probiotic Product (PROBIOTIC DAILY PO) Take 1 tablet by mouth daily.    Marland Kitchen warfarin (COUMADIN) 2 MG tablet Take 2 mg by mouth daily. Take 2mg  every day except on mondays take 1mg      No current facility-administered medications for this visit.     REVIEW OF SYSTEMS:    10 Point review of  Systems was done is negative except as noted above.  PHYSICAL EXAMINATION: ECOG PERFORMANCE STATUS: 3 - Symptomatic, >50% confined to bed  . Vitals:   02/09/17 1424  BP: (!) 147/70  Pulse: (!) 59  Resp: 16  Temp: 98 F (36.7 C)   Filed Weights   02/09/17 1424  Weight: 177 lb 12.8 oz (80.6 kg)   .Body mass index is 29.14 kg/m.  GENERAL:alert, in no acute distress and comfortable SKIN: skin color, texture, turgor are normal, no rashes or significant lesions EYES: normal, conjunctiva are pink and non-injected, sclera clear OROPHARYNX:no exudate, no erythema and lips, buccal mucosa, and tongue normal  NECK: supple, no JVD, thyroid normal size, non-tender, without nodularity LYMPH:  no palpable lymphadenopathy in the cervical, axillary or inguinal LUNGS: clear to auscultation with normal respiratory effort HEART: regular rate & rhythm,  no murmurs and no lower extremity edema ABDOMEN: abdomen soft, non-tender, normoactive bowel sounds  Musculoskeletal: no cyanosis of digits and no clubbing  PSYCH: alert & oriented x 3 with fluent speech NEURO: no focal motor/sensory deficits  LABORATORY DATA:  I have reviewed the data as listed  . CBC Latest Ref Rng & Units 02/09/2017 12/15/2016 09/24/2016  WBC 4.0 - 10.3 10e3/uL 5.0 7.3 6.5  Hemoglobin 13.0 - 17.1 g/dL 10.5(L) 8.8(L) 11.1(L)  Hematocrit 38.4 - 49.9 % 34.2(L) 28.3(L) 35.1(L)  Platelets 140 - 400 10e3/uL 126(L) 178 130(L)   . CBC    Component Value Date/Time   WBC 5.0 02/09/2017 1320   WBC 5.5 04/10/2016 1535   RBC 4.00 (L) 02/09/2017 1320   RBC 3.54 (L) 04/10/2016 1535   HGB 10.5 (L) 02/09/2017 1320   HCT 34.2 (L) 02/09/2017 1320   PLT 126 (L) 02/09/2017 1320   MCV 85.5 02/09/2017 1320   MCH 26.3 (L) 02/09/2017 1320   MCH 21.5 (L) 08/03/2015 1810   MCHC 30.7 (L) 02/09/2017 1320   MCHC 32.7 04/10/2016 1535   RDW 23.4 (H) 02/09/2017 1320   LYMPHSABS 1.0 02/09/2017 1320   MONOABS 0.6 02/09/2017 1320   EOSABS 0.2  02/09/2017 1320   BASOSABS 0.1 02/09/2017 1320   . CMP Latest Ref Rng & Units 02/09/2017 12/15/2016 09/24/2016  Glucose 70 - 140 mg/dl 94 95 88  BUN 7.0 - 26.0 mg/dL 16.2 20.0 25.7  Creatinine 0.7 - 1.3 mg/dL 1.1 1.3 1.1  Sodium 136 - 145 mEq/L 142 142 143  Potassium 3.5 - 5.1 mEq/L 4.3 4.4 4.5  Chloride 98 - 110 mmol/L - - -  CO2 22 - 29 mEq/L 27 27 30(H)  Calcium 8.4 - 10.4 mg/dL 9.4 9.4 9.9  Total Protein 6.4 - 8.3 g/dL 6.5 6.5 6.7  Total Bilirubin 0.20 - 1.20 mg/dL 0.47 0.62 0.46  Alkaline Phos 40 - 150 U/L 117 96 103  AST 5 - 34 U/L 20 20 18   ALT 0 - 55 U/L 14 18 13    . Lab Results  Component Value Date   IRON 37 (L) 02/09/2017   TIBC 315 02/09/2017   IRONPCTSAT 12 (L) 02/09/2017   (Iron and TIBC)  Lab Results  Component Value Date   FERRITIN 79 02/09/2017   EGD 11/12/2015 - Esophageal mucosal changes suggestive of long-segment Barrett's esophagus. Biopsied. - 3 cm hiatal hernia. - Normal stomach. - Normal examined duodenum. Biopsied.  Colonoscopy 11/12/2015 - Two 4 to 5 mm polyps in the cecum, removed with a cold snare. Resected and retrieved. - Two 4 to 5 mm polyps in the ascending colon, removed with a cold snare. Resected and Retrieved - One 14 mm polyp in the ascending colon, removed with a hot snare. Resected and retrieved. Clip was placed. - Internal hemorrhoids.   RADIOGRAPHIC STUDIES: I have personally reviewed the radiological images as listed and agreed with the findings in the report. No results found.  ASSESSMENT & PLAN:   83 year old Caucasian male with multiple medical comorbidities with  1) Microcytic Anemia -predominantly related to iron deficiency from chronic GI losses.  Patient has been on chronic anticoagulation for his atrial fibrillation and this will continue to be a risk factor from going GI losses. He has received IV Feraheme 2 in March and July 2017 without complete correction of his iron deficiency. This is suggestive of ongoing GI  losses. Initial workup as noted below showed no monoclonal protein. No evidence of hemolysis with normal LDH and haptoglobin. Patient had IV Injectafer 750mg  qweekly x2 doses on 05/22/2016 and 05/29/2016 .  Patient's hemoglobin has trended down trending down from 205 to 40 to 29 despite po iron replacement suggesting ongoing GI losses. He has multiple potential sources of GI bleeding especially when on Coumadin.  2) long segment Barrett's esophagus without dysplasia. 3) hiatal hernia 4) multiple adenomatous colonic polyps. Plan -His hemoglobin did improve from 8.8 to 10.5 . With improvement of his ferritin from 29 to 79. Iron saturation is only 12%. -Given his risk for ongoing GI bleeding would aim to keep his ferritin level about 100 and iron saturation of more than 20. -We will offer him additional IV INjectafer 750mg  weekly x 2 doses -Limited benefit of oral iron at this time. -Avoid NSAIDs -Patient was counseled to have a discussion with his gastroenterologist, primary care physician and cardiologist regarding the risk versus benefit of ongoing anticoagulation in the setting of ongoing GI bleeding.  5). Patient Active Problem List   Diagnosis Date Noted  . Iron deficiency anemia due to chronic blood loss 05/14/2016  . Barrett's esophagus without dysplasia 01/22/2016  . Anemia 08/01/2015  . Symptomatic anemia 08/01/2015  . Chronic diastolic CHF (congestive heart failure) (  [place]) 08/01/2015  . Sick sinus syndrome ([place]) 03/20/2013  . Pacemaker 11/24/2011  . Warfarin anticoagulation   . Paroxysmal atrial fibrillation (HCC)   . Valvular heart disease   . Atrial flutter ([place])   . Carotid artery disease ([place])   . History of endocarditis   . HYPERLIPIDEMIA 10/10/2010  . Obstructive sleep apnea 11/01/2009  . Hypertensive cardiovascular disease 04/10/2009  . Coronary atherosclerosis of native coronary artery 04/10/2009  . Secondary cardiomyopathy ([place]) 04/10/2009   -continue f/u with  cardiology and PCP as per their recommendations.   IV Injectafer weekly x 2 doses RTC with Dr Irene Limbo in 10-12 weeks with labs  All of the patients questions were answered with apparent satisfaction. The patient knows to call the clinic with any problems, questions or concerns.  I spent 20 minutes counseling the patient face to face. The total time spent in the appointment was 25 minutes and more than 50% was on counseling and direct patient cares.    Sullivan Lone MD [place] AAHIVMS [facility] Hematology/Oncology Physician [facility]  (Office):       [phone] (Work cell):  [phone] (Fax):           [phone]

## 2017-02-15 ENCOUNTER — Telehealth: Payer: Self-pay

## 2017-02-15 NOTE — Telephone Encounter (Signed)
Patient agreeable to have two injectafer treatments per Dr. Grier Mitts recommendation. Message sent to scheduling to create appts in infusion.

## 2017-02-15 NOTE — Progress Notes (Signed)
Cardiology Office Note  Date: 02/16/2017   ID: Ermine, Spofford 1929-07-11, MRN [id number]  PCP: Eber Hong, MD  Primary Cardiologist: Rozann Lesches, MD   Chief Complaint  Patient presents with  . Valvular heart disease    History of Present Illness: [name] is a medically complex 83 y.o. male last seen in April. He is here with his wife for a follow-up visit. Overall feels about the same, has limited functional capacity at baseline. He does not report any palpitations and his weight has been relatively stable. They are currently dosing Lasix at 60 mg in the morning and then using additional doses based on weight change.  He continues to follow with Dr. Rayann Heman in the device clinic, Medtronic pacemaker in place.  Coumadin follow-up continues per PCP.  He has iron deficiency anemia and is being followed in the hematology clinic, has received iron infusions. Last hemoglobin was 10.8 up from 8.8.  Past Medical History:  Diagnosis Date  . Anemia   . Arthritis   . Atrial flutter (HCC)    Amiodarone therapy, coumadin therapy, prior history of cardioversion and overdrive pacing  . Barrett's esophagus   . Benign prostatic hyperplasia   . Cardiomyopathy    Mixed ischemic and nonischemic with previous EF of 35-40%, most recently in June 2009 normalized to 55-60%. Echo 2/11 EF 55-60%  . Carotid artery disease (HCC)    Less than 50% bilateral internal carotid artery stenosis by Doppler 2009  . Carpal tunnel syndrome   . Chronic diastolic (congestive) heart failure ([place])   . Coronary atherosclerosis of native coronary artery    Status post coronary bypass grafting  . 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 Endocarditis   . Essential hypertension, benign   . Hiatal hernia   . Hyperlipidemia   . Hypothyroidism   . Internal hemorrhoids   . Iron deficiency anemia   . Obstructive sleep apnea   . Osteoporosis   .  Paroxysmal atrial fibrillation (HCC)    Amiodarone therapy, status post Cox-Maze procedure but with recurrent atrial fibrillation while on amiodarone August 2012  . Symptomatic bradycardia   . Tubular adenoma of colon   . Valvular heart disease    Status post by prosthetic aortic valve and mitral valve repair  . Warfarin anticoagulation     Past Surgical History:  Procedure Laterality Date  . AORTIC VALVE REPLACEMENT    . CARPAL TUNNEL RELEASE  2009  . CATARACT EXTRACTION, BILATERAL  07/2011  . CORONARY ARTERY BYPASS GRAFT  2008   x2  . HERNIA REPAIR  age 69  . KNEE ARTHROSCOPY    . PACEMAKER INSERTION  2009   Initially implanted at [facility], extracted by Dr Lovena Le for endocarditis and reimplanted 8/12  . PATELLA FRACTURE SURGERY  age 27   screw placed and removed  . TONSILLECTOMY  1939  . TOTAL HIP ARTHROPLASTY      Current Outpatient Prescriptions  Medication Sig Dispense Refill  . ALOE VERA JUICE PO Take 2 oz by mouth daily.     Marland Kitchen amiodarone (PACERONE) 200 MG tablet Take 0.5 tablets (100 mg total) by mouth daily. 45 tablet 3  . amLODipine (NORVASC) 5 MG tablet Take 5 mg by mouth daily.  3  . benazepril (LOTENSIN) 40 MG tablet TAKE 1 TABLET BY MOUTH DAILY 30 tablet 6  . carvedilol (COREG) 3.125 MG tablet TAKE 1 TABLET (3.125 MG TOTAL)  BY MOUTH 2 (TWO) TIMES DAILY. 60 tablet 6  . docusate sodium (COLACE) 100 MG capsule Take 200 mg by mouth daily.     Marland Kitchen FIBER PO Take 1 packet by mouth daily. ACACIA FIBER-[employer] Brand fiber therapy    . furosemide (LASIX) 40 MG tablet TAKE 2 TABLETS BY MOUTH IN THE MORNING AND TAKE 1 TAB EVERY EVEVING,MAY TAKE 1 EXTRA TAB IN AM. 90 tablet 2  . KLOR-CON M20 20 MEQ tablet TAKE 1 TABLET BY MOUTH DAILY 30 tablet 6  . levothyroxine (SYNTHROID, LEVOTHROID) 50 MCG tablet Take 50 mcg by mouth daily before breakfast.    . Multiple Vitamins-Minerals (THEREMS M PO) Take 1 tablet by mouth daily.      . nitroGLYCERIN (NITROSTAT) 0.4 MG SL tablet Place 1  tablet (0.4 mg total) under the tongue every 5 (five) minutes as needed. Up to 3 doses, if no relief after 3rd dose, proceed to the ED for an evaluation. 25 tablet 3  . pantoprazole (PROTONIX) 40 MG tablet TAKE 1 TABLET BY MOUTH DAILY. 90 tablet 3  . pravastatin (PRAVACHOL) 40 MG tablet TAKE 1 TABLET BY MOUTH IN THE EVENING 90 tablet 2  . Probiotic Product (PROBIOTIC DAILY PO) Take 1 tablet by mouth daily.    Marland Kitchen warfarin (COUMADIN) 2 MG tablet Take 2 mg by mouth daily. Take 2mg  every day except on mondays take 1mg      No current facility-administered medications for this visit.    Allergies:  Patient has no known allergies.   Social History: The patient  reports that he quit smoking about 62 years ago. His smoking use included Cigarettes. He started smoking about 72 years ago. He has a 2.40 pack-year smoking history. He has never used smokeless tobacco. He reports that he drinks alcohol. He reports that he does not use drugs.   ROS:  Please see the history of present illness. Otherwise, complete review of systems is positive for improvement in fatigue with increase in hemoglobin.  All other systems are reviewed and negative.   Physical Exam: VS:  BP (!) 156/70   Pulse 60   Ht 5\' 6"  (1.676 m)   Wt 180 lb 3.2 oz (81.7 kg)   SpO2 95% Comment: on room air  BMI 29.09 kg/m , BMI Body mass index is 29.09 kg/m.  Wt Readings from Last 3 Encounters:  02/16/17 180 lb 3.2 oz (81.7 kg)  02/09/17 177 lb 12.8 oz (80.6 kg)  12/15/16 181 lb 11.2 oz (82.4 kg)    Elderly male in no distress. Using walker. HEENT: Conjunctiva and lids normal, oropharynx clear.  Neck: Supple, no elevated JVP, no thyromegaly.  Lungs: Clear to auscultation, nonlabored breathing at rest.  Cardiac: Regular rate and rhythm, no S3, 3/6 systolic murmur at base and apex, paradoxically split S2, no pericardial rub.  Abdomen: Soft, nontender, protuberent, bowel sounds present, no guarding or rebound.  Extremities: Bilateral  compression hose in place with 1+ pitting edema, distal pulses 1-2+.  ECG: I personally reviewed the tracing from 03/09/2016 which showed dual-chamber pacing.  Recent Labwork: 02/09/2017: ALT 14; AST 20; BUN 16.2; Creatinine 1.1; HGB 10.5; Platelets 126; Potassium 4.3; Sodium 142  February 2018: TSH 5.32 down from 9.43, potassium 3.9, BUN 17, creatinine 1.0  Other Studies Reviewed Today:  Echocardiogram 07/02/2016: Study Conclusions  - Left ventricle: The cavity size was normal. Wall thickness was increased in a pattern of severe LVH. Systolic function was normal. The estimated ejection fraction was in the range  of 55% to 60%. - Regional wall motion abnormality: Mild hypokinesis of the basal-mid inferior and basal-mid inferolateral myocardium. - Aortic valve: Bioprosthetic aortic valve which appears to be functioning normally. Peak velocity (S): 244 cm/s. Mean gradient (S): 16 mm Hg. Valve area (VTI): 2.34 cm^2. Valve area (Vmax): 2.58 cm^2. Valve area (Vmean): 2.29 cm^2. - Mitral valve: S/p mitral annuloplasty. Mildly thickened leaflets . There was mild regurgitation. Mean gradient (D): 4 mm Hg. Valve area by continuity equation (using LVOT flow): 2.93 cm^2. - Left atrium: The atrium was severely dilated. - Right ventricle: The cavity size was mildly dilated. Pacer wire or catheter noted in right ventricle. Systolic function was mildly reduced. - Right atrium: The atrium was severely dilated. Pacer wire or catheter noted in right atrium. - Tricuspid valve: There was moderate-severe regurgitation. - Pulmonic valve: There was mild regurgitation. - Pulmonary arteries: PA peak pressure: 58 mm Hg (S). - Systemic veins: IVC was dilated with normal respiratory variation. Estimated CVP 8 mmHg.  Assessment and Plan:  1. Chronic diastolic heart failure. Continue with current diuretic regimen, check daily weights. Recent lab work showed normal potassium and  creatinine 1.1.  2. Heart disease with history of bioprosthetic AVR and mitral valve repair. Echocardiogram from December 2017 is outlined above.  3. Sick sinus syndrome status post Medtronic pacemaker, keep follow-up with Dr. Rayann Heman.  4. Paroxysmal atrial fibrillation, continues on Coumadin for stroke prophylaxis. Rhythm is been well controlled on amiodarone. Surveillance lab work completed within the last 6 months.  Current medicines were reviewed with the patient today.  Disposition: Follow-up in 4 months.  Signed, Satira Sark, MD, [facility] 02/16/2017 11:55 AM    [place] [facility] [place], [place], [place] [address] Phone: [phone]; Fax: [phone]

## 2017-02-16 ENCOUNTER — Ambulatory Visit (INDEPENDENT_AMBULATORY_CARE_PROVIDER_SITE_OTHER): Payer: Medicare Other | Admitting: Cardiology

## 2017-02-16 ENCOUNTER — Telehealth: Payer: Self-pay | Admitting: Hematology

## 2017-02-16 ENCOUNTER — Encounter: Payer: Self-pay | Admitting: Cardiology

## 2017-02-16 VITALS — BP 156/70 | HR 60 | Ht 66.0 in | Wt 180.2 lb

## 2017-02-16 DIAGNOSIS — I495 Sick sinus syndrome: Secondary | ICD-10-CM

## 2017-02-16 DIAGNOSIS — I480 Paroxysmal atrial fibrillation: Secondary | ICD-10-CM

## 2017-02-16 DIAGNOSIS — I38 Endocarditis, valve unspecified: Secondary | ICD-10-CM

## 2017-02-16 DIAGNOSIS — I5032 Chronic diastolic (congestive) heart failure: Secondary | ICD-10-CM | POA: Diagnosis not present

## 2017-02-16 NOTE — Telephone Encounter (Signed)
Scheduled appt per 7/23 sch message - left message with appt date and time and sent reminder letter in the mail.

## 2017-02-16 NOTE — Patient Instructions (Signed)
Medication Instructions:  Your physician recommends that you continue on your current medications as directed. Please refer to the Current Medication list given to you today.  Labwork: NONE  Testing/Procedures: NONE  Follow-Up: Your physician recommends that you schedule a follow-up appointment in: 4 MONTHS WITH DR. MCDOWELL  Any Other Special Instructions Will Be Listed Below (If Applicable).  If you need a refill on your cardiac medications before your next appointment, please call your pharmacy. 

## 2017-02-17 ENCOUNTER — Telehealth: Payer: Self-pay | Admitting: Hematology

## 2017-02-17 NOTE — Telephone Encounter (Signed)
sw pt to confirm appt time change 8/7 at 115 per sch msg

## 2017-02-20 ENCOUNTER — Other Ambulatory Visit: Payer: Self-pay | Admitting: Cardiology

## 2017-03-01 ENCOUNTER — Ambulatory Visit: Payer: Medicare Other | Attending: Internal Medicine | Admitting: Neurology

## 2017-03-01 DIAGNOSIS — G4733 Obstructive sleep apnea (adult) (pediatric): Secondary | ICD-10-CM | POA: Diagnosis present

## 2017-03-01 DIAGNOSIS — Z7901 Long term (current) use of anticoagulants: Secondary | ICD-10-CM | POA: Diagnosis not present

## 2017-03-01 DIAGNOSIS — Z9989 Dependence on other enabling machines and devices: Secondary | ICD-10-CM

## 2017-03-01 DIAGNOSIS — Z79899 Other long term (current) drug therapy: Secondary | ICD-10-CM | POA: Insufficient documentation

## 2017-03-02 ENCOUNTER — Ambulatory Visit (HOSPITAL_BASED_OUTPATIENT_CLINIC_OR_DEPARTMENT_OTHER): Payer: Medicare Other

## 2017-03-02 VITALS — BP 119/72 | HR 59 | Temp 98.4°F | Resp 16

## 2017-03-02 DIAGNOSIS — D500 Iron deficiency anemia secondary to blood loss (chronic): Secondary | ICD-10-CM | POA: Diagnosis present

## 2017-03-02 MED ORDER — SODIUM CHLORIDE 0.9 % IV SOLN
750.0000 mg | Freq: Once | INTRAVENOUS | Status: AC
  Administered 2017-03-02: 750 mg via INTRAVENOUS
  Filled 2017-03-02: qty 15

## 2017-03-02 NOTE — Patient Instructions (Signed)
Ferric carboxymaltose injection What is this medicine? FERRIC CARBOXYMALTOSE (ferr-ik car-box-ee-mol-toes) is an iron complex. Iron is used to make healthy red blood cells, which carry oxygen and nutrients throughout the body. This medicine is used to treat anemia in people with chronic kidney disease or people who cannot take iron by mouth. This medicine may be used for other purposes; ask your health care provider or pharmacist if you have questions. COMMON BRAND NAME(S): Injectafer What should I tell my health care provider before I take this medicine? They need to know if you have any of these conditions: -anemia not caused by low iron levels -high levels of iron in the blood -liver disease -an unusual or allergic reaction to iron, other medicines, foods, dyes, or preservatives -pregnant or trying to get pregnant -breast-feeding How should I use this medicine? This medicine is for infusion into a vein. It is given by a health care professional in a hospital or clinic setting. Talk to your pediatrician regarding the use of this medicine in children. Special care may be needed. Overdosage: If you think you have taken too much of this medicine contact a poison control center or emergency room at once. NOTE: This medicine is only for you. Do not share this medicine with others. What if I miss a dose? It is important not to miss your dose. Call your doctor or health care professional if you are unable to keep an appointment. What may interact with this medicine? Do not take this medicine with any of the following medications: -deferoxamine -dimercaprol -other iron products This medicine may also interact with the following medications: -chloramphenicol -deferasirox This list may not describe all possible interactions. Give your health care provider a list of all the medicines, herbs, non-prescription drugs, or dietary supplements you use. Also tell them if you smoke, drink alcohol, or use  illegal drugs. Some items may interact with your medicine. What should I watch for while using this medicine? Visit your doctor or health care professional regularly. Tell your doctor if your symptoms do not start to get better or if they get worse. You may need blood work done while you are taking this medicine. You may need to follow a special diet. Talk to your doctor. Foods that contain iron include: whole grains/cereals, dried fruits, beans, or peas, leafy green vegetables, and organ meats (liver, kidney). What side effects may I notice from receiving this medicine? Side effects that you should report to your doctor or health care professional as soon as possible: -allergic reactions like skin rash, itching or hives, swelling of the face, lips, or tongue -breathing problems -changes in blood pressure -feeling faint or lightheaded, falls -flushing, sweating, or hot feelings Side effects that usually do not require medical attention (report to your doctor or health care professional if they continue or are bothersome): -changes in taste -constipation -dizziness -headache -nausea -pain, redness, or irritation at site where injected -vomiting This list may not describe all possible side effects. Call your doctor for medical advice about side effects. You may report side effects to FDA at 1-800-FDA-1088. Where should I keep my medicine? This drug is given in a hospital or clinic and will not be stored at home. NOTE: This sheet is a summary. It may not cover all possible information. If you have questions about this medicine, talk to your doctor, pharmacist, or health care provider.  2018 Elsevier/Gold Standard (2015-08-15 11:20:47)  

## 2017-03-03 ENCOUNTER — Ambulatory Visit (INDEPENDENT_AMBULATORY_CARE_PROVIDER_SITE_OTHER): Payer: Medicare Other | Admitting: *Deleted

## 2017-03-03 DIAGNOSIS — I495 Sick sinus syndrome: Secondary | ICD-10-CM

## 2017-03-04 ENCOUNTER — Encounter: Payer: Self-pay | Admitting: Cardiology

## 2017-03-04 NOTE — Progress Notes (Signed)
Remote pacemaker transmission.   

## 2017-03-07 NOTE — Procedures (Signed)
[place] A. Merlene Laughter, MD     [email or web address]             NOCTURNAL POLYSOMNOGRAPHY   LOCATION: [facility]   Patient Name: [name], [name] Date: 03/01/2017 Gender: Male D.O.B: 10-01-1928 Age (years): 59 Referring Provider: Eber Hong MD Height (inches): 65 Interpreting Physician: Phillips Odor MD, ABSM Weight (lbs): 173 RPSGT: Rosebud Poles BMI: 29 MRN: [id number] Neck Size: 16.50 CLINICAL INFORMATION Sleep Study Type: NPSG  Indication for sleep study: OSA  Epworth Sleepiness Score: 7  SLEEP STUDY TECHNIQUE As per the AASM Manual for the Scoring of Sleep and Associated Events v2.3 (April 2016) with a hypopnea requiring 4% desaturations.  The channels recorded and monitored were frontal, central and occipital EEG, electrooculogram (EOG), submentalis EMG (chin), nasal and oral airflow, thoracic and abdominal wall motion, anterior tibialis EMG, snore microphone, electrocardiogram, and pulse oximetry.  MEDICATIONS Medications self-administered by patient taken the night of the study : N/A  Current Outpatient Prescriptions:  .  ALOE VERA JUICE PO, Take 2 oz by mouth daily. , Disp: , Rfl:  .  amiodarone (PACERONE) 200 MG tablet, Take 0.5 tablets (100 mg total) by mouth daily., Disp: 45 tablet, Rfl: 3 .  amLODipine (NORVASC) 5 MG tablet, Take 5 mg by mouth daily., Disp: , Rfl: 3 .  benazepril (LOTENSIN) 40 MG tablet, TAKE 1 TABLET BY MOUTH DAILY, Disp: 30 tablet, Rfl: 6 .  carvedilol (COREG) 3.125 MG tablet, TAKE 1 TABLET (3.125 MG TOTAL) BY MOUTH 2 (TWO) TIMES DAILY., Disp: 60 tablet, Rfl: 6 .  docusate sodium (COLACE) 100 MG capsule, Take 200 mg by mouth daily. , Disp: , Rfl:  .  FIBER PO, Take 1 packet by mouth daily. ACACIA FIBER-[employer] Brand fiber therapy, Disp: , Rfl:  .  furosemide (LASIX) 40 MG tablet, TAKE 2 TABLETS BY MOUTH IN THE MORNING AND TAKE 1 TAB EVERY EVEVING,MAY TAKE 1 EXTRA TAB IN AM., Disp: 90 tablet, Rfl: 2 .  KLOR-CON M20  20 MEQ tablet, TAKE 1 TABLET BY MOUTH DAILY, Disp: 30 tablet, Rfl: 3 .  levothyroxine (SYNTHROID, LEVOTHROID) 50 MCG tablet, Take 50 mcg by mouth daily before breakfast., Disp: , Rfl:  .  Multiple Vitamins-Minerals (THEREMS M PO), Take 1 tablet by mouth daily.  , Disp: , Rfl:  .  nitroGLYCERIN (NITROSTAT) 0.4 MG SL tablet, Place 1 tablet (0.4 mg total) under the tongue every 5 (five) minutes as needed. Up to 3 doses, if no relief after 3rd dose, proceed to the ED for an evaluation., Disp: 25 tablet, Rfl: 3 .  pantoprazole (PROTONIX) 40 MG tablet, TAKE 1 TABLET BY MOUTH DAILY., Disp: 90 tablet, Rfl: 3 .  pravastatin (PRAVACHOL) 40 MG tablet, TAKE 1 TABLET BY MOUTH IN THE EVENING, Disp: 90 tablet, Rfl: 2 .  Probiotic Product (PROBIOTIC DAILY PO), Take 1 tablet by mouth daily., Disp: , Rfl:  .  warfarin (COUMADIN) 2 MG tablet, Take 2 mg by mouth daily. Take 2mg  every day except on mondays take 1mg , Disp: , Rfl:    SLEEP ARCHITECTURE The study was initiated at 11:10:42 PM and ended at 5:23:43 AM.  Sleep onset time was 34.8 minutes and the sleep efficiency was 58.1%. The total sleep time was 216.7 minutes.  Stage REM latency was 243.5 minutes.  The patient spent 5.54% of the night in stage N1 sleep, 45.55% in stage N2 sleep, 47.53% in stage N3 and 1.38% in REM.  Alpha intrusion was absent.  Supine sleep was 3.46%.  RESPIRATORY PARAMETERS The overall apnea/hypopnea index (AHI) was 8.3 per hour. There were 3 total apneas, including 2 obstructive, 0 central and 1 mixed apneas. There were 27 hypopneas and 0 RERAs.  The AHI during Stage REM sleep was 40.0 per hour.  AHI while supine was 72.0 per hour.  The mean oxygen saturation was 87.64%. The minimum SpO2 during sleep was 76.00%.  Soft snoring was noted during this study.  CARDIAC DATA The 2 lead EKG demonstrated sinus rhythm. The mean heart rate was N/A beats per minute. Other EKG findings include: Atrial Fibrillation, PVCs. LEG MOVEMENT  DATA The total PLMS were 0 with a resulting PLMS index of 0.00. Associated arousal with leg movement index was 0.0.  IMPRESSIONS - Mild obstructive sleep apnea is documented during this recording.The severity does not require positive pressure treatment.  Delano Metz, MD Diplomate, American Board of Sleep Medicine.   ELECTRONICALLY SIGNED ON:  03/07/2017, 10:11 AM [facility] PH: [phone]   FX: [phone] [place]

## 2017-03-09 ENCOUNTER — Ambulatory Visit (HOSPITAL_BASED_OUTPATIENT_CLINIC_OR_DEPARTMENT_OTHER): Payer: Medicare Other

## 2017-03-09 VITALS — BP 129/62 | HR 60 | Temp 98.0°F | Resp 17

## 2017-03-09 DIAGNOSIS — D500 Iron deficiency anemia secondary to blood loss (chronic): Secondary | ICD-10-CM

## 2017-03-09 MED ORDER — FERRIC CARBOXYMALTOSE 750 MG/15ML IV SOLN
750.0000 mg | Freq: Once | INTRAVENOUS | Status: AC
  Administered 2017-03-09: 750 mg via INTRAVENOUS
  Filled 2017-03-09: qty 15

## 2017-03-09 NOTE — Patient Instructions (Signed)
Ferric carboxymaltose injection What is this medicine? FERRIC CARBOXYMALTOSE (ferr-ik car-box-ee-mol-toes) is an iron complex. Iron is used to make healthy red blood cells, which carry oxygen and nutrients throughout the body. This medicine is used to treat anemia in people with chronic kidney disease or people who cannot take iron by mouth. This medicine may be used for other purposes; ask your health care provider or pharmacist if you have questions. COMMON BRAND NAME(S): Injectafer What should I tell my health care provider before I take this medicine? They need to know if you have any of these conditions: -anemia not caused by low iron levels -high levels of iron in the blood -liver disease -an unusual or allergic reaction to iron, other medicines, foods, dyes, or preservatives -pregnant or trying to get pregnant -breast-feeding How should I use this medicine? This medicine is for infusion into a vein. It is given by a health care professional in a hospital or clinic setting. Talk to your pediatrician regarding the use of this medicine in children. Special care may be needed. Overdosage: If you think you have taken too much of this medicine contact a poison control center or emergency room at once. NOTE: This medicine is only for you. Do not share this medicine with others. What if I miss a dose? It is important not to miss your dose. Call your doctor or health care professional if you are unable to keep an appointment. What may interact with this medicine? Do not take this medicine with any of the following medications: -deferoxamine -dimercaprol -other iron products This medicine may also interact with the following medications: -chloramphenicol -deferasirox This list may not describe all possible interactions. Give your health care provider a list of all the medicines, herbs, non-prescription drugs, or dietary supplements you use. Also tell them if you smoke, drink alcohol, or use  illegal drugs. Some items may interact with your medicine. What should I watch for while using this medicine? Visit your doctor or health care professional regularly. Tell your doctor if your symptoms do not start to get better or if they get worse. You may need blood work done while you are taking this medicine. You may need to follow a special diet. Talk to your doctor. Foods that contain iron include: whole grains/cereals, dried fruits, beans, or peas, leafy green vegetables, and organ meats (liver, kidney). What side effects may I notice from receiving this medicine? Side effects that you should report to your doctor or health care professional as soon as possible: -allergic reactions like skin rash, itching or hives, swelling of the face, lips, or tongue -breathing problems -changes in blood pressure -feeling faint or lightheaded, falls -flushing, sweating, or hot feelings Side effects that usually do not require medical attention (report to your doctor or health care professional if they continue or are bothersome): -changes in taste -constipation -dizziness -headache -nausea -pain, redness, or irritation at site where injected -vomiting This list may not describe all possible side effects. Call your doctor for medical advice about side effects. You may report side effects to FDA at 1-800-FDA-1088. Where should I keep my medicine? This drug is given in a hospital or clinic and will not be stored at home. NOTE: This sheet is a summary. It may not cover all possible information. If you have questions about this medicine, talk to your doctor, pharmacist, or health care provider.  2018 Elsevier/Gold Standard (2015-08-15 11:20:47)  

## 2017-03-23 ENCOUNTER — Telehealth: Payer: Self-pay

## 2017-03-23 LAB — CUP PACEART REMOTE DEVICE CHECK
Battery Impedance: 1300 Ohm
Battery Remaining Longevity: 40 mo
Battery Voltage: 2.75 V
Brady Statistic AP VP Percent: 98 %
Brady Statistic AP VS Percent: 2 %
Brady Statistic AS VP Percent: 0 %
Brady Statistic AS VS Percent: 0 %
Date Time Interrogation Session: 20180808203542
Implantable Lead Implant Date: 20120815
Implantable Lead Implant Date: 20120815
Implantable Lead Location: 753859
Implantable Lead Location: 753860
Implantable Lead Model: 5076
Implantable Lead Model: 5076
Implantable Pulse Generator Implant Date: 20120815
Lead Channel Impedance Value: 419 Ohm
Lead Channel Impedance Value: 473 Ohm
Lead Channel Pacing Threshold Amplitude: 1 V
Lead Channel Pacing Threshold Pulse Width: 0.4 ms
Lead Channel Setting Pacing Amplitude: 2.5 V
Lead Channel Setting Pacing Amplitude: 2.5 V
Lead Channel Setting Pacing Pulse Width: 0.4 ms
Lead Channel Setting Sensing Sensitivity: 2 mV

## 2017-03-23 NOTE — Telephone Encounter (Signed)
Pt called regarding appt on 8/30. Received VM that told him about the appt and he accidentally accepted. Pt already has lab and MD f/u scheduled for 9/25. Pt verbalized that this was correct. 8/30 appts were cancelled because of error.

## 2017-03-25 ENCOUNTER — Ambulatory Visit: Payer: Medicare Other | Admitting: Hematology

## 2017-03-25 ENCOUNTER — Other Ambulatory Visit: Payer: Medicare Other

## 2017-04-15 NOTE — Progress Notes (Signed)
[facility]    HEMATOLOGY/ONCOLOGY CLINIC NOTE  Date of Service: ..04/20/2017  Patient Care Team: Eber Hong, MD as PCP - General (Internal Medicine) Zenovia Jarred MD (Gastroenterology)  CHIEF COMPLAINTS/PURPOSE OF CONSULTATION:  Iron deficiency anemia  HISTORY OF PRESENTING ILLNESS:   [name] is a wonderful 83 y.o. male who has been referred to [place] by Dr .Eber Hong, MD  for evaluation and management of Iron deficiency Anemia  He has a history of multiple medical comorbidities including coronary artery disease, chronic systolic CHF, atrial fibrillation on Coumadin, peripheral vascular disease, permanent pacemaker placement, adenomatous colonic polyps and long segment Barrett's esophagus without significant dysplasia on chronic PPI therapy.  Patient has been noting increasing fatigue from earlier this year and was noted to have anemia with hemoglobin of 7.8 with an MCV of 69 in January 2017. Ferritin level was noted to be 19 with an iron saturation of 3%. Fecal occult blood testing in March was noted to be positive for occult GI bleeding.  Patient received IV Feraheme in March and July 2017 for iron replacement. He had an upper endoscopy and colonoscopy which were performed on 11/12/2015. EGD revealed 4 cm of Barrett's esophagus which was biopsied and found to be Barrett's without dysplasia. There was a 3 cm hiatal hernia. Normal stomach and normal duodenum. Duodenal biopsies were negative for celiac. Colonoscopy performed on the same day revealed 2 cecal polyps. 3 ascending colon polyps one of which was 14 mm and removed with hot snare. It was prophylactically endoclipped. The remainder of the examination was unremarkable. Internal hemorrhoids were seen. These polyps were found to be adenomas without high-grade dysplasia. Has not had a capsule endoscopy.  Patient's hemoglobin levels have been somewhat better in the mid to high 9 range but with persistent iron deficiency and lack of  normalization of his anemia.  He has been referred to [place] for further management of his iron deficiency anemia. He continues to be on Coumadin for his atrial fibrillation. He has been on oral iron preparation which is causing some constipation and darker stools. Reports no overt GI bleeding. His last INR was 2.3 yesterday as per his report. He monitors this with his primary care physician. Notes persistent fatigue which is likely multifactorial.  Is on levothyroxine replacement for hypothyroidism. Takes a daily multivitamin.  Patient reports that he notes his weight closely to avoid fluid overload from CHF and notes no weight loss.  No other evidence of overt bleeding. No hematuria no gum bleeds no epistaxis. Notes easy skin bruising. Not using any over-the-counter NSAIDs.  INTERVAL HISTORY  [name] Is here for his scheduled followup for Iron deficiency anemia.    No overt GI bleeding noted. His hemoglobin has improved from 8.8 up to 10.5 and now upto 11.3. With some improvement in his ferritin level to 138 He notes he feels better.  He continues to be on Coumadin at this time for anticoagulation for atrial fibrillation.   MEDICAL HISTORY:  Past Medical History:  Diagnosis Date  . Anemia   . Arthritis   . Atrial flutter (HCC)    Amiodarone therapy, coumadin therapy, prior history of cardioversion and overdrive pacing  . Barrett's esophagus   . Benign prostatic hyperplasia   . Cardiomyopathy    Mixed ischemic and nonischemic with previous EF of 35-40%, most recently in June 2009 normalized to 55-60%. Echo 2/11 EF 55-60%  . Carotid artery disease (HCC)    Less than 50% bilateral internal carotid artery stenosis by  Doppler 2009  . Carpal tunnel syndrome   . Chronic diastolic (congestive) heart failure ([place])   . Coronary atherosclerosis of native coronary artery    Status post coronary bypass grafting  . Endocarditis    s/p PPM extraction by Dr Lovena Le 2012 endocarditis with  enterococcus faecalis  . Endocarditis   . Essential hypertension, benign   . Hiatal hernia   . Hyperlipidemia   . Hypothyroidism   . Internal hemorrhoids   . Iron deficiency anemia   . Obstructive sleep apnea   . Osteoporosis   . Paroxysmal atrial fibrillation (HCC)    Amiodarone therapy, status post Cox-Maze procedure but with recurrent atrial fibrillation while on amiodarone August 2012  . Symptomatic bradycardia   . Tubular adenoma of colon   . Valvular heart disease    Status post by prosthetic aortic valve and mitral valve repair  . Warfarin anticoagulation     SURGICAL HISTORY: Past Surgical History:  Procedure Laterality Date  . AORTIC VALVE REPLACEMENT    . CARPAL TUNNEL RELEASE  2009  . CATARACT EXTRACTION, BILATERAL  07/2011  . CORONARY ARTERY BYPASS GRAFT  2008   x2  . HERNIA REPAIR  age 58  . KNEE ARTHROSCOPY    . PACEMAKER INSERTION  2009   Initially implanted at [facility], extracted by Dr Lovena Le for endocarditis and reimplanted 8/12  . PATELLA FRACTURE SURGERY  age 66   screw placed and removed  . TONSILLECTOMY  1939  . TOTAL HIP ARTHROPLASTY      SOCIAL HISTORY: Social History   Social History  . Marital status: Married    Spouse name: N/A  . Number of children: 3  . Years of education: N/A   Occupational History  . Retired    Social History Main Topics  . Smoking status: Former Smoker    Packs/day: 0.30    Years: 8.00    Types: Cigarettes    Start date: 03/25/1944    Quit date: 07/27/1954  . Smokeless tobacco: Never Used  . Alcohol use 0.0 oz/week     Comment: glass of wine occ  . Drug use: No  . Sexual activity: Not on file   Other Topics Concern  . Not on file   Social History Narrative  . No narrative on file    FAMILY HISTORY: Family History  Problem Relation Age of Onset  . Coronary artery disease Father   . Other Father        Heart attack or stroke  . Cancer Father   . Colon cancer Mother   . Diabetes Neg Hx   . Hypertension  Neg Hx   . Esophageal cancer Neg Hx   . Rectal cancer Neg Hx   . Stomach cancer Neg Hx     ALLERGIES:  has No Known Allergies.  MEDICATIONS:  Current Outpatient Prescriptions  Medication Sig Dispense Refill  . ALOE VERA JUICE PO Take 2 oz by mouth daily.     Marland Kitchen amiodarone (PACERONE) 200 MG tablet Take 0.5 tablets (100 mg total) by mouth daily. 45 tablet 3  . amLODipine (NORVASC) 5 MG tablet Take 5 mg by mouth daily.  3  . benazepril (LOTENSIN) 40 MG tablet TAKE 1 TABLET BY MOUTH DAILY 30 tablet 6  . carvedilol (COREG) 3.125 MG tablet TAKE 1 TABLET (3.125 MG TOTAL) BY MOUTH 2 (TWO) TIMES DAILY. 60 tablet 6  . docusate sodium (COLACE) 100 MG capsule Take 200 mg by mouth daily.     Marland Kitchen  FIBER PO Take 1 packet by mouth daily. ACACIA FIBER-[employer] Brand fiber therapy    . furosemide (LASIX) 40 MG tablet TAKE 2 TABLETS BY MOUTH IN THE MORNING AND TAKE 1 TAB EVERY EVEVING,MAY TAKE 1 EXTRA TAB IN AM. 90 tablet 2  . KLOR-CON M20 20 MEQ tablet TAKE 1 TABLET BY MOUTH DAILY 30 tablet 3  . levothyroxine (SYNTHROID, LEVOTHROID) 50 MCG tablet Take 50 mcg by mouth daily before breakfast.    . Multiple Vitamins-Minerals (THEREMS M PO) Take 1 tablet by mouth daily.      . nitroGLYCERIN (NITROSTAT) 0.4 MG SL tablet Place 1 tablet (0.4 mg total) under the tongue every 5 (five) minutes as needed. Up to 3 doses, if no relief after 3rd dose, proceed to the ED for an evaluation. 25 tablet 3  . pantoprazole (PROTONIX) 40 MG tablet TAKE 1 TABLET BY MOUTH DAILY. 90 tablet 3  . pravastatin (PRAVACHOL) 40 MG tablet TAKE 1 TABLET BY MOUTH IN THE EVENING 90 tablet 2  . Probiotic Product (PROBIOTIC DAILY PO) Take 1 tablet by mouth daily.    Marland Kitchen warfarin (COUMADIN) 2 MG tablet Take 2 mg by mouth daily. Take 2mg  every day except on mondays take 1mg      No current facility-administered medications for this visit.     REVIEW OF SYSTEMS:    10 Point review of Systems was done is negative except as noted above.  PHYSICAL  EXAMINATION:  ECOG PERFORMANCE STATUS: 3 - Symptomatic, >50% confined to bed  . Vitals:   04/20/17 1318  BP: (!) 153/64  Pulse: (!) 59  Resp: 20  Temp: 97.7 F (36.5 C)  SpO2: 96%   Filed Weights   04/20/17 1318  Weight: 176 lb 4.8 oz (80 kg)   .Body mass index is 28.46 kg/m.  GENERAL:alert, in no acute distress and comfortable SKIN: skin color, texture, turgor are normal, no rashes or significant lesions EYES: normal, conjunctiva are pink and non-injected, sclera clear OROPHARYNX:no exudate, no erythema and lips, buccal mucosa, and tongue normal  NECK: supple, no JVD, thyroid normal size, non-tender, without nodularity LYMPH:  no palpable lymphadenopathy in the cervical, axillary or inguinal LUNGS: clear to auscultation with normal respiratory effort HEART: regular rate & rhythm,  no murmurs and no lower extremity edema ABDOMEN: abdomen soft, non-tender, normoactive bowel sounds  Musculoskeletal: no cyanosis of digits and no clubbing  PSYCH: alert & oriented x 3 with fluent speech NEURO: no focal motor/sensory deficits  LABORATORY DATA:  I have reviewed the data as listed  . CBC Latest Ref Rng & Units 02/09/2017 12/15/2016 09/24/2016  WBC 4.0 - 10.3 10e3/uL 5.0 7.3 6.5  Hemoglobin 13.0 - 17.1 g/dL 10.5(L) 8.8(L) 11.1(L)  Hematocrit 38.4 - 49.9 % 34.2(L) 28.3(L) 35.1(L)  Platelets 140 - 400 10e3/uL 126(L) 178 130(L)   . CBC    Component Value Date/Time   WBC 5.0 02/09/2017 1320   WBC 5.5 04/10/2016 1535   RBC 4.00 (L) 02/09/2017 1320   RBC 3.54 (L) 04/10/2016 1535   HGB 10.5 (L) 02/09/2017 1320   HCT 34.2 (L) 02/09/2017 1320   PLT 126 (L) 02/09/2017 1320   MCV 85.5 02/09/2017 1320   MCH 26.3 (L) 02/09/2017 1320   MCH 21.5 (L) 08/03/2015 1810   MCHC 30.7 (L) 02/09/2017 1320   MCHC 32.7 04/10/2016 1535   RDW 23.4 (H) 02/09/2017 1320   LYMPHSABS 1.0 02/09/2017 1320   MONOABS 0.6 02/09/2017 1320   EOSABS 0.2 02/09/2017 1320  BASOSABS 0.1 02/09/2017 1320    . CMP Latest Ref Rng & Units 02/09/2017 12/15/2016 09/24/2016  Glucose 70 - 140 mg/dl 94 95 88  BUN 7.0 - 26.0 mg/dL 16.2 20.0 25.7  Creatinine 0.7 - 1.3 mg/dL 1.1 1.3 1.1  Sodium 136 - 145 mEq/L 142 142 143  Potassium 3.5 - 5.1 mEq/L 4.3 4.4 4.5  Chloride 98 - 110 mmol/L - - -  CO2 22 - 29 mEq/L 27 27 30(H)  Calcium 8.4 - 10.4 mg/dL 9.4 9.4 9.9  Total Protein 6.4 - 8.3 g/dL 6.5 6.5 6.7  Total Bilirubin 0.20 - 1.20 mg/dL 0.47 0.62 0.46  Alkaline Phos 40 - 150 U/L 117 96 103  AST 5 - 34 U/L 20 20 18   ALT 0 - 55 U/L 14 18 13    . Lab Results  Component Value Date   IRON 34 (L) 04/20/2017   TIBC 276 04/20/2017   IRONPCTSAT 12 (L) 04/20/2017   (Iron and TIBC)  Lab Results  Component Value Date   FERRITIN 138 04/20/2017   EGD 11/12/2015 - Esophageal mucosal changes suggestive of long-segment Barrett's esophagus. Biopsied. - 3 cm hiatal hernia. - Normal stomach. - Normal examined duodenum. Biopsied.  Colonoscopy 11/12/2015 - Two 4 to 5 mm polyps in the cecum, removed with a cold snare. Resected and retrieved. - Two 4 to 5 mm polyps in the ascending colon, removed with a cold snare. Resected and Retrieved - One 14 mm polyp in the ascending colon, removed with a hot snare. Resected and retrieved. Clip was placed. - Internal hemorrhoids.   RADIOGRAPHIC STUDIES: I have personally reviewed the radiological images as listed and agreed with the findings in the report. No results found.  ASSESSMENT & PLAN:   83 year old Caucasian male with multiple medical comorbidities with  1) Microcytic Anemia -predominantly related to iron deficiency from chronic GI losses.  Patient has been on chronic anticoagulation for his atrial fibrillation and this will continue to be a risk factor from going GI losses. He has received IV Feraheme 2 in March and July 2017 without complete correction of his iron deficiency. This is suggestive of ongoing GI losses. Initial workup as noted below showed  no monoclonal protein. No evidence of hemolysis with normal LDH and haptoglobin. Patient had IV Injectafer 750mg  qweekly x2 doses on 05/22/2016 and 05/29/2016 . He has multiple potential sources of GI bleeding especially when on Coumadin.  2) long segment Barrett's esophagus without dysplasia. 3) hiatal hernia 4) multiple adenomatous colonic polyps. Plan -His hemoglobin did improve from 8.8 to 10.5 and now to 11.3 .  With improvement of his ferritin from  79 to 138.. -no indication for additional IV Iron at this time. -Limited benefit of oral iron at this time. -Avoid NSAIDs -Patient was counseled to have a discussion with his gastroenterologist, primary care physician and cardiologist regarding the risk versus benefit of ongoing anticoagulation in the setting of ongoing GI bleeding.  5). Patient Active Problem List   Diagnosis Date Noted  . Iron deficiency anemia due to chronic blood loss 05/14/2016  . Barrett's esophagus without dysplasia 01/22/2016  . Anemia 08/01/2015  . Symptomatic anemia 08/01/2015  . Chronic diastolic CHF (congestive heart failure) ([place]) 08/01/2015  . Sick sinus syndrome ([place]) 03/20/2013  . Pacemaker 11/24/2011  . Warfarin anticoagulation   . Paroxysmal atrial fibrillation (HCC)   . Valvular heart disease   . Atrial flutter ([place])   . Carotid artery disease ([place])   . History of endocarditis   .  HYPERLIPIDEMIA 10/10/2010  . Obstructive sleep apnea 11/01/2009  . Hypertensive cardiovascular disease 04/10/2009  . Coronary atherosclerosis of native coronary artery 04/10/2009  . Secondary cardiomyopathy ([place]) 04/10/2009   -continue f/u with cardiology and PCP as per their recommendations.  RTC with Dr Irene Limbo with labs in 3 months  All of the patients questions were answered with apparent satisfaction. The patient knows to call the clinic with any problems, questions or concerns.  I spent 20 minutes counseling the patient face to face. The total time spent in  the appointment was 20 minutes and more than 50% was on counseling and direct patient cares.   This document serves as a record of services personally performed by Sullivan Lone, MD. It was created on her behalf by Brandt Loosen, a trained medical scribe. The creation of this record is based on the scribe's personal observations and the provider's statements to them. This document has been checked and approved by the attending provider.  Sullivan Lone MD [place] AAHIVMS [facility] Hematology/Oncology Physician [facility]  (Office):       [phone] (Work cell):  [phone] (Fax):           [phone]

## 2017-04-20 ENCOUNTER — Other Ambulatory Visit (HOSPITAL_BASED_OUTPATIENT_CLINIC_OR_DEPARTMENT_OTHER): Payer: Medicare Other

## 2017-04-20 ENCOUNTER — Telehealth: Payer: Self-pay | Admitting: Hematology

## 2017-04-20 ENCOUNTER — Ambulatory Visit (HOSPITAL_BASED_OUTPATIENT_CLINIC_OR_DEPARTMENT_OTHER): Payer: Medicare Other | Admitting: Hematology

## 2017-04-20 ENCOUNTER — Encounter: Payer: Self-pay | Admitting: Hematology

## 2017-04-20 VITALS — BP 153/64 | HR 59 | Temp 97.7°F | Resp 20 | Ht 66.0 in | Wt 176.3 lb

## 2017-04-20 DIAGNOSIS — D500 Iron deficiency anemia secondary to blood loss (chronic): Secondary | ICD-10-CM | POA: Diagnosis present

## 2017-04-20 DIAGNOSIS — Z7901 Long term (current) use of anticoagulants: Secondary | ICD-10-CM

## 2017-04-20 LAB — CBC & DIFF AND RETIC
BASO%: 1.7 % (ref 0.0–2.0)
Basophils Absolute: 0.1 10*3/uL (ref 0.0–0.1)
EOS%: 2.9 % (ref 0.0–7.0)
Eosinophils Absolute: 0.2 10*3/uL (ref 0.0–0.5)
HCT: 35.9 % — ABNORMAL LOW (ref 38.4–49.9)
HGB: 11.3 g/dL — ABNORMAL LOW (ref 13.0–17.1)
Immature Retic Fract: 14.2 % — ABNORMAL HIGH (ref 3.00–10.60)
LYMPH%: 13.6 % — ABNORMAL LOW (ref 14.0–49.0)
MCH: 28.4 pg (ref 27.2–33.4)
MCHC: 31.5 g/dL — ABNORMAL LOW (ref 32.0–36.0)
MCV: 90.2 fL (ref 79.3–98.0)
MONO#: 0.6 10*3/uL (ref 0.1–0.9)
MONO%: 11.5 % (ref 0.0–14.0)
NEUT#: 3.6 10*3/uL (ref 1.5–6.5)
NEUT%: 70.3 % (ref 39.0–75.0)
Platelets: 137 10*3/uL — ABNORMAL LOW (ref 140–400)
RBC: 3.98 10*6/uL — ABNORMAL LOW (ref 4.20–5.82)
RDW: 18.7 % — ABNORMAL HIGH (ref 11.0–14.6)
Retic %: 1.2 % (ref 0.80–1.80)
Retic Ct Abs: 47.76 10*3/uL (ref 34.80–93.90)
WBC: 5.2 10*3/uL (ref 4.0–10.3)
lymph#: 0.7 10*3/uL — ABNORMAL LOW (ref 0.9–3.3)

## 2017-04-20 LAB — COMPREHENSIVE METABOLIC PANEL
ALT: 13 U/L (ref 0–55)
AST: 19 U/L (ref 5–34)
Albumin: 3.6 g/dL (ref 3.5–5.0)
Alkaline Phosphatase: 131 U/L (ref 40–150)
Anion Gap: 6 mEq/L (ref 3–11)
BUN: 16.7 mg/dL (ref 7.0–26.0)
CO2: 28 mEq/L (ref 22–29)
Calcium: 9.5 mg/dL (ref 8.4–10.4)
Chloride: 106 mEq/L (ref 98–109)
Creatinine: 0.9 mg/dL (ref 0.7–1.3)
EGFR: 78 mL/min/{1.73_m2} — ABNORMAL LOW (ref >90)
Glucose: 93 mg/dl (ref 70–140)
Potassium: 4.2 mEq/L (ref 3.5–5.1)
Sodium: 141 mEq/L (ref 136–145)
Total Bilirubin: 0.55 mg/dL (ref 0.20–1.20)
Total Protein: 6.7 g/dL (ref 6.4–8.3)

## 2017-04-20 LAB — FERRITIN: Ferritin: 138 ng/ml (ref 22–316)

## 2017-04-20 LAB — IRON AND TIBC
%SAT: 12 % — ABNORMAL LOW (ref 20–55)
Iron: 34 ug/dL — ABNORMAL LOW (ref 42–163)
TIBC: 276 ug/dL (ref 202–409)
UIBC: 243 ug/dL (ref 117–376)

## 2017-04-20 NOTE — Telephone Encounter (Signed)
Gave avs and calendar December

## 2017-05-28 ENCOUNTER — Encounter: Payer: Medicare Other | Admitting: Internal Medicine

## 2017-06-02 ENCOUNTER — Ambulatory Visit (INDEPENDENT_AMBULATORY_CARE_PROVIDER_SITE_OTHER): Payer: Medicare Other | Admitting: *Deleted

## 2017-06-02 ENCOUNTER — Telehealth: Payer: Self-pay | Admitting: Cardiology

## 2017-06-02 DIAGNOSIS — I495 Sick sinus syndrome: Secondary | ICD-10-CM

## 2017-06-02 NOTE — Telephone Encounter (Signed)
Spoke with pt and reminded pt of remote transmission that is due today. Pt verbalized understanding.   

## 2017-06-03 ENCOUNTER — Encounter: Payer: Self-pay | Admitting: Cardiology

## 2017-06-03 NOTE — Progress Notes (Signed)
Remote pacemaker transmission.   

## 2017-06-04 ENCOUNTER — Ambulatory Visit (INDEPENDENT_AMBULATORY_CARE_PROVIDER_SITE_OTHER): Payer: Medicare Other | Admitting: Internal Medicine

## 2017-06-04 ENCOUNTER — Encounter: Payer: Self-pay | Admitting: Internal Medicine

## 2017-06-04 ENCOUNTER — Other Ambulatory Visit: Payer: Self-pay

## 2017-06-04 VITALS — BP 134/74 | HR 60 | Ht 64.0 in | Wt 178.0 lb

## 2017-06-04 DIAGNOSIS — I495 Sick sinus syndrome: Secondary | ICD-10-CM

## 2017-06-04 DIAGNOSIS — I480 Paroxysmal atrial fibrillation: Secondary | ICD-10-CM

## 2017-06-04 DIAGNOSIS — I119 Hypertensive heart disease without heart failure: Secondary | ICD-10-CM

## 2017-06-04 NOTE — Patient Instructions (Signed)
Medication Instructions:  Continue all current medications.  Labwork: none  Testing/Procedures: none  Follow-Up: Your physician wants you to follow up in:  1 year.  You will receive a reminder letter in the mail one-two months in advance.  If you don't receive a letter, please call our office to schedule the follow up appointment   Any Other Special Instructions Will Be Listed Below (If Applicable). Remote monitoring is used to monitor your Pacemaker of ICD from home. This monitoring reduces the number of office visits required to check your device to one time per year. It allows [place] to keep an eye on the functioning of your device to ensure it is working properly. You are scheduled for a device check from home on 09-01-2017. You may send your transmission at any time that day. If you have a wireless device, the transmission will be sent automatically. After your physician reviews your transmission, you will receive a postcard with your next transmission date.  If you need a refill on your cardiac medications before your next appointment, please call your pharmacy.

## 2017-06-04 NOTE — Progress Notes (Signed)
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 male who presents today for routine electrophysiology followup.  Since last being seen in our clinic, the patient reports doing reasonably well.  Remains unsteady at times.  Ambulates by wheelchair now.  Today, he denies symptoms of palpitations, chest pain, shortness of breath,  lower extremity edema, dizziness, presyncope, or syncope.  The patient is otherwise without complaint today.   Past Medical History:  Diagnosis Date  . Anemia   . Arthritis   . Atrial flutter (HCC)    Amiodarone therapy, coumadin therapy, prior history of cardioversion and overdrive pacing  . Barrett's esophagus   . Benign prostatic hyperplasia   . Cardiomyopathy    Mixed ischemic and nonischemic with previous EF of 35-40%, most recently in June 2009 normalized to 55-60%. Echo 2/11 EF 55-60%  . Carotid artery disease (HCC)    Less than 50% bilateral internal carotid artery stenosis by Doppler 2009  . Carpal tunnel syndrome   . Chronic diastolic (congestive) heart failure ([place])   . Coronary atherosclerosis of native coronary artery    Status post coronary bypass grafting  . 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 Endocarditis   . Essential hypertension, benign   . Hiatal hernia   . Hyperlipidemia   . Hypothyroidism   . Internal hemorrhoids   . Iron deficiency anemia   . Obstructive sleep apnea   . Osteoporosis   . Paroxysmal atrial fibrillation (HCC)    Amiodarone therapy, status post Cox-Maze procedure but with recurrent atrial fibrillation while on amiodarone August 2012  . Symptomatic bradycardia   . Tubular adenoma of colon   . Valvular heart disease    Status post by prosthetic aortic valve and mitral valve repair  . Warfarin anticoagulation    Past Surgical History:  Procedure Laterality Date  . AORTIC VALVE REPLACEMENT    .  CARPAL TUNNEL RELEASE  2009  . CATARACT EXTRACTION, BILATERAL  07/2011  . CORONARY ARTERY BYPASS GRAFT  2008   x2  . HERNIA REPAIR  age 7  . KNEE ARTHROSCOPY    . PACEMAKER INSERTION  2009   Initially implanted at [facility], extracted by Dr Lovena Le for endocarditis and reimplanted 8/12  . PATELLA FRACTURE SURGERY  age 22   screw placed and removed  . TONSILLECTOMY  1939  . TOTAL HIP ARTHROPLASTY      ROS- all systems are reviewed and negative except as per HPI above  Current Outpatient Medications  Medication Sig Dispense Refill  . ALOE VERA JUICE PO Take 2 oz by mouth daily.     Marland Kitchen amiodarone (PACERONE) 200 MG tablet Take 0.5 tablets (100 mg total) by mouth daily. 45 tablet 3  . amLODipine (NORVASC) 5 MG tablet Take 5 mg by mouth daily.  3  . benazepril (LOTENSIN) 40 MG tablet TAKE 1 TABLET BY MOUTH DAILY 30 tablet 6  . carvedilol (COREG) 3.125 MG tablet TAKE 1 TABLET (3.125 MG TOTAL) BY MOUTH 2 (TWO) TIMES DAILY. 60 tablet 6  . docusate sodium (COLACE) 100 MG capsule Take 200 mg by mouth daily.     Marland Kitchen FIBER PO Take 1 packet by mouth daily. ACACIA FIBER-[employer] Brand fiber therapy    . furosemide (LASIX) 40 MG tablet TAKE 2 TABLETS BY MOUTH IN THE MORNING AND TAKE 1 TAB EVERY EVEVING,MAY TAKE 1  EXTRA TAB IN AM. 90 tablet 2  . KLOR-CON M20 20 MEQ tablet TAKE 1 TABLET BY MOUTH DAILY 30 tablet 3  . levothyroxine (SYNTHROID, LEVOTHROID) 50 MCG tablet Take 50 mcg by mouth daily before breakfast.    . Multiple Vitamins-Minerals (THEREMS M PO) Take 1 tablet by mouth daily.      . nitroGLYCERIN (NITROSTAT) 0.4 MG SL tablet Place 1 tablet (0.4 mg total) under the tongue every 5 (five) minutes as needed. Up to 3 doses, if no relief after 3rd dose, proceed to the ED for an evaluation. 25 tablet 3  . pantoprazole (PROTONIX) 40 MG tablet TAKE 1 TABLET BY MOUTH DAILY. 90 tablet 3  . pravastatin (PRAVACHOL) 40 MG tablet TAKE 1 TABLET BY MOUTH IN THE EVENING 90 tablet 2  . Probiotic Product (PROBIOTIC  DAILY PO) Take 1 tablet by mouth daily.    Marland Kitchen warfarin (COUMADIN) 2 MG tablet Take 2 mg by mouth daily. Take 2mg  every day except on mondays take 1mg      No current facility-administered medications for this visit.     Physical Exam: Vitals:   06/04/17 1202  BP: 134/74  Pulse: 60  SpO2: 96%  Weight: 80.7 kg (178 lb)  Height: 5\' 4"  (1.626 m)    GEN- The patient is elderly and chronically ill appearing, alert and oriented x 3 today.   Head- normocephalic, atraumatic Eyes-  Sclera clear, conjunctiva pink Ears- hearing intact Oropharynx- clear Lungs- Clear to ausculation bilaterally, normal work of breathing Chest- pacemaker pocket is well healed Heart- Regular rate and rhythm, 3/6 SEM LLSB GI- soft, NT, ND, + BS Extremities- no clubbing, cyanosis, or edema  Pacemaker interrogation- reviewed in detail today,  See PACEART report  Echo 2017 reviewed  ekg tracing ordered today is personally reviewed and shows av paced rhythm  Assessment and Plan:  1. Symptomatic sinus bradycardia  Normal pacemaker function See Pace Art report No changes today  2. afib Well controlled with amiodarone 100mg  daily lfts reviewed pcp to follow tfts Burden < 1% (longest episode < 2 minutes)  3. Valvular heart disaese On coumadin Followed by [name]  4. Hypertensive cardiovascular disease Stable No change required today  carelink Return to see me in a year Follow-up with [name] as scheduled  Thompson Grayer MD, [facility] 06/04/2017 12:29 PM

## 2017-06-08 LAB — CUP PACEART REMOTE DEVICE CHECK
Battery Impedance: 1497 Ohm
Battery Remaining Longevity: 36 mo
Battery Voltage: 2.75 V
Brady Statistic AP VP Percent: 98 %
Brady Statistic AP VS Percent: 2 %
Brady Statistic AS VP Percent: 0 %
Brady Statistic AS VS Percent: 0 %
Date Time Interrogation Session: 20181107202452
Implantable Lead Implant Date: 20120815
Implantable Lead Implant Date: 20120815
Implantable Lead Location: 753859
Implantable Lead Location: 753860
Implantable Lead Model: 5076
Implantable Lead Model: 5076
Implantable Pulse Generator Implant Date: 20120815
Lead Channel Impedance Value: 430 Ohm
Lead Channel Impedance Value: 480 Ohm
Lead Channel Pacing Threshold Amplitude: 0.875 V
Lead Channel Pacing Threshold Pulse Width: 0.4 ms
Lead Channel Setting Pacing Amplitude: 2.5 V
Lead Channel Setting Pacing Amplitude: 2.5 V
Lead Channel Setting Pacing Pulse Width: 0.4 ms
Lead Channel Setting Sensing Sensitivity: 2 mV

## 2017-06-11 LAB — CUP PACEART INCLINIC DEVICE CHECK
Battery Impedance: 1497 Ohm
Battery Remaining Longevity: 36 mo
Battery Voltage: 2.76 V
Date Time Interrogation Session: 20181116140059
Implantable Lead Implant Date: 20120815
Implantable Lead Implant Date: 20120815
Implantable Lead Location: 753859
Implantable Lead Location: 753860
Implantable Lead Model: 5076
Implantable Lead Model: 5076
Implantable Pulse Generator Implant Date: 20120815
Lead Channel Impedance Value: 430 Ohm
Lead Channel Impedance Value: 483 Ohm
Lead Channel Pacing Threshold Amplitude: 0.5 V
Lead Channel Pacing Threshold Amplitude: 0.75 V
Lead Channel Pacing Threshold Pulse Width: 0.4 ms
Lead Channel Pacing Threshold Pulse Width: 0.4 ms
Lead Channel Setting Pacing Amplitude: 2.5 V
Lead Channel Setting Pacing Amplitude: 2.5 V
Lead Channel Setting Pacing Pulse Width: 0.4 ms
Lead Channel Setting Sensing Sensitivity: 2 mV

## 2017-06-12 ENCOUNTER — Other Ambulatory Visit: Payer: Self-pay | Admitting: Internal Medicine

## 2017-06-12 ENCOUNTER — Other Ambulatory Visit: Payer: Self-pay | Admitting: Cardiology

## 2017-07-01 ENCOUNTER — Other Ambulatory Visit: Payer: Self-pay | Admitting: Cardiology

## 2017-07-03 ENCOUNTER — Inpatient Hospital Stay (HOSPITAL_COMMUNITY): Payer: Medicare Other

## 2017-07-03 ENCOUNTER — Inpatient Hospital Stay (HOSPITAL_COMMUNITY)
Admission: AD | Admit: 2017-07-03 | Discharge: 2017-07-06 | DRG: 292 | Disposition: A | Discharge status: Home Health | Payer: Medicare Other | Source: Other Acute Inpatient Hospital | Attending: Family Medicine | Admitting: Family Medicine

## 2017-07-03 DIAGNOSIS — G4733 Obstructive sleep apnea (adult) (pediatric): Secondary | ICD-10-CM | POA: Diagnosis present

## 2017-07-03 DIAGNOSIS — Z7901 Long term (current) use of anticoagulants: Secondary | ICD-10-CM | POA: Diagnosis not present

## 2017-07-03 DIAGNOSIS — D500 Iron deficiency anemia secondary to blood loss (chronic): Secondary | ICD-10-CM | POA: Diagnosis present

## 2017-07-03 DIAGNOSIS — I480 Paroxysmal atrial fibrillation: Secondary | ICD-10-CM | POA: Diagnosis present

## 2017-07-03 DIAGNOSIS — Z87891 Personal history of nicotine dependence: Secondary | ICD-10-CM

## 2017-07-03 DIAGNOSIS — I700 Atherosclerosis of aorta: Secondary | ICD-10-CM | POA: Diagnosis present

## 2017-07-03 DIAGNOSIS — I2510 Atherosclerotic heart disease of native coronary artery without angina pectoris: Secondary | ICD-10-CM | POA: Diagnosis present

## 2017-07-03 DIAGNOSIS — R0609 Other forms of dyspnea: Secondary | ICD-10-CM | POA: Diagnosis not present

## 2017-07-03 DIAGNOSIS — Z953 Presence of xenogenic heart valve: Secondary | ICD-10-CM | POA: Diagnosis not present

## 2017-07-03 DIAGNOSIS — Z800 Family history of malignant neoplasm of digestive organs: Secondary | ICD-10-CM | POA: Diagnosis not present

## 2017-07-03 DIAGNOSIS — I5032 Chronic diastolic (congestive) heart failure: Secondary | ICD-10-CM | POA: Diagnosis not present

## 2017-07-03 DIAGNOSIS — Z79890 Hormone replacement therapy (postmenopausal): Secondary | ICD-10-CM

## 2017-07-03 DIAGNOSIS — M1711 Unilateral primary osteoarthritis, right knee: Secondary | ICD-10-CM | POA: Diagnosis present

## 2017-07-03 DIAGNOSIS — D649 Anemia, unspecified: Secondary | ICD-10-CM | POA: Diagnosis present

## 2017-07-03 DIAGNOSIS — I509 Heart failure, unspecified: Secondary | ICD-10-CM | POA: Diagnosis present

## 2017-07-03 DIAGNOSIS — R0902 Hypoxemia: Secondary | ICD-10-CM | POA: Diagnosis present

## 2017-07-03 DIAGNOSIS — I4892 Unspecified atrial flutter: Secondary | ICD-10-CM | POA: Diagnosis present

## 2017-07-03 DIAGNOSIS — K5900 Constipation, unspecified: Secondary | ICD-10-CM | POA: Diagnosis not present

## 2017-07-03 DIAGNOSIS — Z9989 Dependence on other enabling machines and devices: Secondary | ICD-10-CM | POA: Diagnosis present

## 2017-07-03 DIAGNOSIS — R7881 Bacteremia: Secondary | ICD-10-CM | POA: Diagnosis present

## 2017-07-03 DIAGNOSIS — Z950 Presence of cardiac pacemaker: Secondary | ICD-10-CM | POA: Diagnosis present

## 2017-07-03 DIAGNOSIS — E039 Hypothyroidism, unspecified: Secondary | ICD-10-CM | POA: Diagnosis present

## 2017-07-03 DIAGNOSIS — I361 Nonrheumatic tricuspid (valve) insufficiency: Secondary | ICD-10-CM | POA: Diagnosis not present

## 2017-07-03 DIAGNOSIS — M7121 Synovial cyst of popliteal space [Baker], right knee: Secondary | ICD-10-CM | POA: Diagnosis present

## 2017-07-03 DIAGNOSIS — E785 Hyperlipidemia, unspecified: Secondary | ICD-10-CM | POA: Diagnosis present

## 2017-07-03 DIAGNOSIS — I110 Hypertensive heart disease with heart failure: Principal | ICD-10-CM | POA: Diagnosis present

## 2017-07-03 DIAGNOSIS — I495 Sick sinus syndrome: Secondary | ICD-10-CM | POA: Diagnosis present

## 2017-07-03 DIAGNOSIS — N471 Phimosis: Secondary | ICD-10-CM | POA: Diagnosis present

## 2017-07-03 DIAGNOSIS — I5033 Acute on chronic diastolic (congestive) heart failure: Secondary | ICD-10-CM | POA: Diagnosis present

## 2017-07-03 DIAGNOSIS — I255 Ischemic cardiomyopathy: Secondary | ICD-10-CM | POA: Diagnosis present

## 2017-07-03 DIAGNOSIS — Z8249 Family history of ischemic heart disease and other diseases of the circulatory system: Secondary | ICD-10-CM

## 2017-07-03 DIAGNOSIS — I340 Nonrheumatic mitral (valve) insufficiency: Secondary | ICD-10-CM | POA: Diagnosis not present

## 2017-07-03 DIAGNOSIS — I38 Endocarditis, valve unspecified: Secondary | ICD-10-CM | POA: Diagnosis not present

## 2017-07-03 DIAGNOSIS — Z951 Presence of aortocoronary bypass graft: Secondary | ICD-10-CM | POA: Diagnosis not present

## 2017-07-03 DIAGNOSIS — M810 Age-related osteoporosis without current pathological fracture: Secondary | ICD-10-CM | POA: Diagnosis present

## 2017-07-03 DIAGNOSIS — I119 Hypertensive heart disease without heart failure: Secondary | ICD-10-CM | POA: Diagnosis present

## 2017-07-03 DIAGNOSIS — Z66 Do not resuscitate: Secondary | ICD-10-CM | POA: Diagnosis present

## 2017-07-03 LAB — BASIC METABOLIC PANEL
Anion gap: 8 (ref 5–15)
BUN: 14 mg/dL (ref 6–20)
CO2: 26 mmol/L (ref 22–32)
Calcium: 8.5 mg/dL — ABNORMAL LOW (ref 8.9–10.3)
Chloride: 102 mmol/L (ref 101–111)
Creatinine, Ser: 1.1 mg/dL (ref 0.61–1.24)
GFR calc Af Amer: >60 mL/min (ref >60)
GFR calc non Af Amer: 59 mL/min — ABNORMAL LOW (ref >60)
Glucose, Bld: 174 mg/dL — ABNORMAL HIGH (ref 65–99)
Potassium: 3.1 mmol/L — ABNORMAL LOW (ref 3.5–5.1)
Sodium: 136 mmol/L (ref 135–145)

## 2017-07-03 LAB — CBC WITH DIFFERENTIAL/PLATELET
Basophils Absolute: 0 10*3/uL (ref 0.0–0.1)
Basophils Relative: 0 %
Eosinophils Absolute: 0 10*3/uL (ref 0.0–0.7)
Eosinophils Relative: 0 %
HCT: 27.3 % — ABNORMAL LOW (ref 39.0–52.0)
Hemoglobin: 8.4 g/dL — ABNORMAL LOW (ref 13.0–17.0)
Lymphocytes Relative: 12 %
Lymphs Abs: 1.2 10*3/uL (ref 0.7–4.0)
MCH: 26.3 pg (ref 26.0–34.0)
MCHC: 30.8 g/dL (ref 30.0–36.0)
MCV: 85.6 fL (ref 78.0–100.0)
Monocytes Absolute: 0.5 10*3/uL (ref 0.1–1.0)
Monocytes Relative: 5 %
Neutro Abs: 8.3 10*3/uL — ABNORMAL HIGH (ref 1.7–7.7)
Neutrophils Relative %: 83 %
Platelets: 195 10*3/uL (ref 150–400)
RBC: 3.19 MIL/uL — ABNORMAL LOW (ref 4.22–5.81)
RDW: 17.8 % — ABNORMAL HIGH (ref 11.5–15.5)
WBC: 10 10*3/uL (ref 4.0–10.5)

## 2017-07-03 LAB — PROTIME-INR
INR: 3.01
Prothrombin Time: 31 seconds — ABNORMAL HIGH (ref 11.4–15.2)

## 2017-07-03 LAB — MAGNESIUM: Magnesium: 2.2 mg/dL (ref 1.7–2.4)

## 2017-07-03 MED ORDER — PANTOPRAZOLE SODIUM 40 MG PO TBEC
40.0000 mg | DELAYED_RELEASE_TABLET | Freq: Every day | ORAL | Status: DC
  Administered 2017-07-04 – 2017-07-06 (×3): 40 mg via ORAL
  Filled 2017-07-03 (×3): qty 1

## 2017-07-03 MED ORDER — WARFARIN SODIUM 2 MG PO TABS: 2.0000 mg | ORAL_TABLET | Freq: Every day | ORAL | Status: DC

## 2017-07-03 MED ORDER — BENAZEPRIL HCL 40 MG PO TABS
40.0000 mg | ORAL_TABLET | Freq: Every day | ORAL | Status: DC
  Administered 2017-07-05 – 2017-07-06 (×2): 40 mg via ORAL
  Filled 2017-07-03 (×3): qty 1

## 2017-07-03 MED ORDER — PRAVASTATIN SODIUM 40 MG PO TABS
40.0000 mg | ORAL_TABLET | Freq: Every evening | ORAL | Status: DC
  Administered 2017-07-03 – 2017-07-05 (×3): 40 mg via ORAL
  Filled 2017-07-03 (×3): qty 1

## 2017-07-03 MED ORDER — DOCUSATE SODIUM 100 MG PO CAPS
200.0000 mg | ORAL_CAPSULE | Freq: Every day | ORAL | Status: DC
Start: 2017-07-04 — End: 2017-07-06
  Administered 2017-07-04 – 2017-07-06 (×3): 200 mg via ORAL
  Filled 2017-07-03 (×3): qty 2

## 2017-07-03 MED ORDER — FUROSEMIDE 10 MG/ML IJ SOLN
40.0000 mg | Freq: Every day | INTRAMUSCULAR | Status: DC
Start: 2017-07-04 — End: 2017-07-04
  Administered 2017-07-04: 40 mg via INTRAVENOUS
  Filled 2017-07-03: qty 4

## 2017-07-03 MED ORDER — CARVEDILOL 3.125 MG PO TABS
3.1250 mg | ORAL_TABLET | Freq: Two times a day (BID) | ORAL | Status: DC
  Administered 2017-07-04 – 2017-07-06 (×5): 3.125 mg via ORAL
  Filled 2017-07-03 (×5): qty 1

## 2017-07-03 MED ORDER — AMLODIPINE BESYLATE 5 MG PO TABS
5.0000 mg | ORAL_TABLET | Freq: Every day | ORAL | Status: DC
Start: 2017-07-04 — End: 2017-07-06
  Administered 2017-07-05 – 2017-07-06 (×2): 5 mg via ORAL
  Filled 2017-07-03 (×3): qty 1

## 2017-07-03 MED ORDER — AMIODARONE HCL 100 MG PO TABS
100.0000 mg | ORAL_TABLET | Freq: Every day | ORAL | Status: DC
  Administered 2017-07-04 – 2017-07-06 (×3): 100 mg via ORAL
  Filled 2017-07-03 (×3): qty 1

## 2017-07-03 MED ORDER — LEVOTHYROXINE SODIUM 50 MCG PO TABS
50.0000 ug | ORAL_TABLET | Freq: Every day | ORAL | Status: DC
  Administered 2017-07-04 – 2017-07-06 (×3): 50 ug via ORAL
  Filled 2017-07-03 (×3): qty 1

## 2017-07-03 MED ORDER — POTASSIUM CHLORIDE CRYS ER 20 MEQ PO TBCR
20.0000 meq | EXTENDED_RELEASE_TABLET | Freq: Every day | ORAL | Status: DC
  Administered 2017-07-04 – 2017-07-06 (×3): 20 meq via ORAL
  Filled 2017-07-03 (×3): qty 1

## 2017-07-03 MED ORDER — SODIUM CHLORIDE 0.9% FLUSH
3.0000 mL | Freq: Two times a day (BID) | INTRAVENOUS | Status: DC
  Administered 2017-07-03 – 2017-07-06 (×5): 3 mL via INTRAVENOUS
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
## 2017-07-04 ENCOUNTER — Inpatient Hospital Stay (HOSPITAL_COMMUNITY): Payer: Medicare Other

## 2017-07-04 ENCOUNTER — Other Ambulatory Visit: Payer: Self-pay

## 2017-07-04 DIAGNOSIS — R0609 Other forms of dyspnea: Secondary | ICD-10-CM

## 2017-07-04 DIAGNOSIS — I361 Nonrheumatic tricuspid (valve) insufficiency: Secondary | ICD-10-CM

## 2017-07-04 LAB — CBC
HCT: 26.6 % — ABNORMAL LOW (ref 39.0–52.0)
Hemoglobin: 8.3 g/dL — ABNORMAL LOW (ref 13.0–17.0)
MCH: 26.9 pg (ref 26.0–34.0)
MCHC: 31.2 g/dL (ref 30.0–36.0)
MCV: 86.1 fL (ref 78.0–100.0)
Platelets: 202 10*3/uL (ref 150–400)
RBC: 3.09 MIL/uL — ABNORMAL LOW (ref 4.22–5.81)
RDW: 17.9 % — ABNORMAL HIGH (ref 11.5–15.5)
WBC: 9.2 10*3/uL (ref 4.0–10.5)

## 2017-07-04 LAB — BASIC METABOLIC PANEL
Anion gap: 8 (ref 5–15)
BUN: 14 mg/dL (ref 6–20)
CO2: 27 mmol/L (ref 22–32)
Calcium: 8.8 mg/dL — ABNORMAL LOW (ref 8.9–10.3)
Chloride: 103 mmol/L (ref 101–111)
Creatinine, Ser: 1.01 mg/dL (ref 0.61–1.24)
GFR calc Af Amer: >60 mL/min (ref >60)
GFR calc non Af Amer: >60 mL/min (ref >60)
Glucose, Bld: 108 mg/dL — ABNORMAL HIGH (ref 65–99)
Potassium: 3.2 mmol/L — ABNORMAL LOW (ref 3.5–5.1)
Sodium: 138 mmol/L (ref 135–145)

## 2017-07-04 LAB — ECHOCARDIOGRAM COMPLETE
Height: 66 in
Weight: 2684.32 oz

## 2017-07-04 LAB — PROTIME-INR
INR: 3.01
Prothrombin Time: 31 seconds — ABNORMAL HIGH (ref 11.4–15.2)

## 2017-07-04 LAB — SEDIMENTATION RATE: Sed Rate: 57 mm/hr — ABNORMAL HIGH (ref 0–16)

## 2017-07-04 LAB — STREP PNEUMONIAE URINARY ANTIGEN: Strep Pneumo Urinary Antigen: NEGATIVE

## 2017-07-04 LAB — TSH: TSH: 2.303 u[IU]/mL (ref 0.350–4.500)

## 2017-07-04 LAB — BRAIN NATRIURETIC PEPTIDE: B Natriuretic Peptide: 695.9 pg/mL — ABNORMAL HIGH (ref 0.0–100.0)

## 2017-07-04 MED ORDER — WARFARIN SODIUM 1 MG PO TABS
1.0000 mg | ORAL_TABLET | Freq: Once | ORAL | Status: AC
  Administered 2017-07-04: 1 mg via ORAL
  Filled 2017-07-04: qty 1

## 2017-07-04 MED ORDER — WARFARIN - PHARMACIST DOSING INPATIENT
Freq: Every day | Status: DC
  Administered 2017-07-04 – 2017-07-05 (×2)

## 2017-07-04 MED ORDER — WARFARIN SODIUM 2 MG PO TABS: 2.0000 mg | ORAL_TABLET | Freq: Once | ORAL | Status: DC

## 2017-07-04 MED ORDER — LIDOCAINE HCL (PF) 1 % IJ SOLN
30.0000 mL | Freq: Once | INTRAMUSCULAR | Status: AC
  Administered 2017-07-04: 30 mL
  Filled 2017-07-04: qty 30

## 2017-07-04 MED ORDER — POTASSIUM CHLORIDE CRYS ER 20 MEQ PO TBCR
40.0000 meq | EXTENDED_RELEASE_TABLET | ORAL | Status: AC
  Administered 2017-07-04 (×2): 40 meq via ORAL
  Filled 2017-07-04 (×2): qty 2

## 2017-07-04 MED ORDER — CEFUROXIME AXETIL 500 MG PO TABS
500.0000 mg | ORAL_TABLET | Freq: Two times a day (BID) | ORAL | Status: DC
  Administered 2017-07-04 – 2017-07-06 (×5): 500 mg via ORAL
  Filled 2017-07-04 (×5): qty 1

## 2017-07-04 MED ORDER — METHYLPREDNISOLONE ACETATE 40 MG/ML IJ SUSP
80.0000 mg | Freq: Once | INTRAMUSCULAR | Status: AC
  Administered 2017-07-04: 80 mg via INTRA_ARTICULAR
  Filled 2017-07-04: qty 2

## 2017-07-04 MED ORDER — FUROSEMIDE 80 MG PO TABS
80.0000 mg | ORAL_TABLET | Freq: Every day | ORAL | Status: DC
  Administered 2017-07-04 – 2017-07-06 (×3): 80 mg via ORAL
  Filled 2017-07-04 (×3): qty 1

## 2017-07-04 MED ORDER — FUROSEMIDE 40 MG PO TABS
40.0000 mg | ORAL_TABLET | Freq: Every day | ORAL | Status: DC
  Administered 2017-07-05: 40 mg via ORAL
  Filled 2017-07-04: qty 1

## 2017-07-04 MED ORDER — WARFARIN SODIUM 1 MG PO TABS
1.0000 mg | ORAL_TABLET | Freq: Once | ORAL | Status: AC
Start: 2017-07-04 — End: 2017-07-04
  Administered 2017-07-04: 1 mg via ORAL
  Filled 2017-07-04: qty 1

## 2017-07-04 NOTE — Progress Notes (Signed)
ANTICOAGULATION CONSULT NOTE - Initial Consult  Pharmacy Consult for Warfarin  Indication: atrial fibrillation  No Known Allergies  Patient Measurements: Height: 5\' 6"  (167.6 cm) Weight: 165 lb 6 oz (75 kg) IBW/kg (Calculated) : 63.8  Vital Signs: Temp: 98.7 F (37.1 C) (12/09 0008) Temp Source: Oral (12/09 0008) BP: 121/57 (12/09 0008) Pulse Rate: 60 (12/09 0008)  Labs: Recent Labs    07/03/17 2300  HGB 8.4*  HCT 27.3*  PLT 195  LABPROT 31.0*  INR 3.01  CREATININE 1.10    Estimated Creatinine Clearance: 43.5 mL/min (by C-G formula based on SCr of 1.1 mg/dL).   Medical History: Past Medical History:  Diagnosis Date  . Anemia   . Arthritis   . Atrial flutter (HCC)    Amiodarone therapy, coumadin therapy, prior history of cardioversion and overdrive pacing  . Barrett's esophagus   . Benign prostatic hyperplasia   . Cardiomyopathy    Mixed ischemic and nonischemic with previous EF of 35-40%, most recently in June 2009 normalized to 55-60%. Echo 2/11 EF 55-60%  . Carotid artery disease (HCC)    Less than 50% bilateral internal carotid artery stenosis by Doppler 2009  . Carpal tunnel syndrome   . Chronic diastolic (congestive) heart failure ([place])   . Coronary atherosclerosis of native coronary artery    Status post coronary bypass grafting  . 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 Endocarditis   . Essential hypertension, benign   . Hiatal hernia   . Hyperlipidemia   . Hypothyroidism   . Internal hemorrhoids   . Iron deficiency anemia   . Obstructive sleep apnea   . Osteoporosis   . Paroxysmal atrial fibrillation (HCC)    Amiodarone therapy, status post Cox-Maze procedure but with recurrent atrial fibrillation while on amiodarone August 2012  . Symptomatic bradycardia   . Tubular adenoma of colon   . Valvular heart disease    Status post by prosthetic aortic valve and mitral valve repair  . Warfarin  anticoagulation     Assessment: 83 y/o M transfer from [place] with right knee pain/swelling and hypoxemia. On warfarin PTA for afib. INR is 3.01. Hgb 8.4. Will give a reduced warfarin dose tonight, last dose of warfarin was 12/7.   Goal of Therapy:  INR 2-3 Monitor platelets by anticoagulation protocol: Yes   Plan:  -Warfarin 1 mg PO x 1 tonight  -Daily PT/INR -Monitor for bleeding  Bearett, Porcaro 07/04/2017,12:50 AM

## 2017-07-04 NOTE — Plan of Care (Signed)
VSS during 7 a to 7 p shift, unable to wean patient to room air, 88% on room air, 93% on 2 liters.  R knee aspirated today by ortho with some improvement in pain per patient, still not able to bear weight on it this shift.  Foley catheter remains in place, patient has some urologic issues and cardiology notes specifies to leave catheter in place for now.  Wife at bedside entire shift.

## 2017-07-04 NOTE — Progress Notes (Signed)
Echocardiogram 2D Echocardiogram has been performed.  [name], [name] 07/04/2017, 2:11 PM

## 2017-07-04 NOTE — Consult Note (Signed)
Reason for Consult:Right knee pain and swelling Referring Physician: Dr. Amadeo Garnet JIGAR ZIELKE is an 83 y.o. male.  HPI: 83 yo male known to my practice from treatment for right knee osteoarthritis. Has had multiple injections in past of cortisone and Euflexxa. Has had a significant increase in pain in past 2 weeks and was unable to bear weight 2 days ago. He usually ambulates short distances with a walker or is in a wheelchair according to his wife. He went to the ED in [place] and was diagnosed with acute CHF and sent here for admission. We were consulted today for the pain and swelling in his right knee.  Past Medical History:  Diagnosis Date  . Anemia   . Arthritis   . Atrial flutter (HCC)    Amiodarone therapy, coumadin therapy, prior history of cardioversion and overdrive pacing  . Barrett's esophagus   . Benign prostatic hyperplasia   . Cardiomyopathy    Mixed ischemic and nonischemic with previous EF of 35-40%, most recently in June 2009 normalized to 55-60%. Echo 2/11 EF 55-60%  . Carotid artery disease (HCC)    Less than 50% bilateral internal carotid artery stenosis by Doppler 2009  . Carpal tunnel syndrome   . Chronic diastolic (congestive) heart failure ([place])   . Coronary atherosclerosis of native coronary artery    Status post coronary bypass grafting  . 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 Endocarditis   . Essential hypertension, benign   . Hiatal hernia   . Hyperlipidemia   . Hypothyroidism   . Internal hemorrhoids   . Iron deficiency anemia   . Obstructive sleep apnea   . Osteoporosis   . Paroxysmal atrial fibrillation (HCC)    Amiodarone therapy, status post Cox-Maze procedure but with recurrent atrial fibrillation while on amiodarone August 2012  . Symptomatic bradycardia   . Tubular adenoma of colon   . Valvular heart disease    Status post by prosthetic aortic valve and mitral valve repair  .  Warfarin anticoagulation     Past Surgical History:  Procedure Laterality Date  . AORTIC VALVE REPLACEMENT    . CARPAL TUNNEL RELEASE  2009  . CATARACT EXTRACTION, BILATERAL  07/2011  . CORONARY ARTERY BYPASS GRAFT  2008   x2  . HERNIA REPAIR  age 69  . KNEE ARTHROSCOPY    . PACEMAKER INSERTION  2009   Initially implanted at [facility], extracted by Dr Lovena Le for endocarditis and reimplanted 8/12  . PATELLA FRACTURE SURGERY  age 30   screw placed and removed  . TONSILLECTOMY  1939  . TOTAL HIP ARTHROPLASTY      Family History  Problem Relation Age of Onset  . Coronary artery disease Father   . Other Father        Heart attack or stroke  . Cancer Father   . Colon cancer Mother   . Diabetes Neg Hx   . Hypertension Neg Hx   . Esophageal cancer Neg Hx   . Rectal cancer Neg Hx   . Stomach cancer Neg Hx     Social History:  reports that he quit smoking about 62 years ago. His smoking use included cigarettes. He started smoking about 73 years ago. He has a 2.40 pack-year smoking history. he has never used smokeless tobacco. He reports that he drinks alcohol. He reports that he does not use drugs.  Allergies: No Known Allergies  Medications: I have  reviewed the patient's current medications.  Results for orders placed or performed during the hospital encounter of 07/03/17 (from the past 48 hour(s))  Basic metabolic panel     Status: Abnormal   Collection Time: 07/03/17 11:00 PM  Result Value Ref Range   Sodium 136 135 - 145 mmol/L   Potassium 3.1 (L) 3.5 - 5.1 mmol/L   Chloride 102 101 - 111 mmol/L   CO2 26 22 - 32 mmol/L   Glucose, Bld 174 (H) 65 - 99 mg/dL   BUN 14 6 - 20 mg/dL   Creatinine, Ser 1.10 0.61 - 1.24 mg/dL   Calcium 8.5 (L) 8.9 - 10.3 mg/dL   GFR calc non Af Amer 59 (L) >60 mL/min   GFR calc Af Amer >60 >60 mL/min    Comment: (NOTE) The eGFR has been calculated using the CKD EPI equation. This calculation has not been validated in all clinical  situations. eGFR's persistently <60 mL/min signify possible Chronic Kidney Disease.    Anion gap 8 5 - 15  Magnesium     Status: None   Collection Time: 07/03/17 11:00 PM  Result Value Ref Range   Magnesium 2.2 1.7 - 2.4 mg/dL  CBC WITH DIFFERENTIAL     Status: Abnormal   Collection Time: 07/03/17 11:00 PM  Result Value Ref Range   WBC 10.0 4.0 - 10.5 K/uL   RBC 3.19 (L) 4.22 - 5.81 MIL/uL   Hemoglobin 8.4 (L) 13.0 - 17.0 g/dL   HCT 27.3 (L) 39.0 - 52.0 %   MCV 85.6 78.0 - 100.0 fL   MCH 26.3 26.0 - 34.0 pg   MCHC 30.8 30.0 - 36.0 g/dL   RDW 17.8 (H) 11.5 - 15.5 %   Platelets 195 150 - 400 K/uL   Neutrophils Relative % 83 %   Neutro Abs 8.3 (H) 1.7 - 7.7 K/uL   Lymphocytes Relative 12 %   Lymphs Abs 1.2 0.7 - 4.0 K/uL   Monocytes Relative 5 %   Monocytes Absolute 0.5 0.1 - 1.0 K/uL   Eosinophils Relative 0 %   Eosinophils Absolute 0.0 0.0 - 0.7 K/uL   Basophils Relative 0 %   Basophils Absolute 0.0 0.0 - 0.1 K/uL  Protime-INR     Status: Abnormal   Collection Time: 07/03/17 11:00 PM  Result Value Ref Range   Prothrombin Time 31.0 (H) 11.4 - 15.2 seconds   INR 3.01   Brain natriuretic peptide     Status: Abnormal   Collection Time: 07/03/17 11:00 PM  Result Value Ref Range   B Natriuretic Peptide 695.9 (H) 0.0 - 100.0 pg/mL  Strep pneumoniae urinary antigen     Status: None   Collection Time: 07/04/17  4:16 AM  Result Value Ref Range   Strep Pneumo Urinary Antigen NEGATIVE NEGATIVE    Comment:        Infection due to S. pneumoniae cannot be absolutely ruled out since the antigen present may be below the detection limit of the test.   Basic metabolic panel     Status: Abnormal   Collection Time: 07/04/17  5:05 AM  Result Value Ref Range   Sodium 138 135 - 145 mmol/L   Potassium 3.2 (L) 3.5 - 5.1 mmol/L   Chloride 103 101 - 111 mmol/L   CO2 27 22 - 32 mmol/L   Glucose, Bld 108 (H) 65 - 99 mg/dL   BUN 14 6 - 20 mg/dL   Creatinine, Ser 1.01 0.61 - 1.24 mg/dL  Calcium 8.8 (L) 8.9 - 10.3 mg/dL   GFR calc non Af Amer >60 >60 mL/min   GFR calc Af Amer >60 >60 mL/min    Comment: (NOTE) The eGFR has been calculated using the CKD EPI equation. This calculation has not been validated in all clinical situations. eGFR's persistently <60 mL/min signify possible Chronic Kidney Disease.    Anion gap 8 5 - 15  Protime-INR     Status: Abnormal   Collection Time: 07/04/17  5:05 AM  Result Value Ref Range   Prothrombin Time 31.0 (H) 11.4 - 15.2 seconds   INR 3.01   CBC     Status: Abnormal   Collection Time: 07/04/17  5:05 AM  Result Value Ref Range   WBC 9.2 4.0 - 10.5 K/uL   RBC 3.09 (L) 4.22 - 5.81 MIL/uL   Hemoglobin 8.3 (L) 13.0 - 17.0 g/dL   HCT 26.6 (L) 39.0 - 52.0 %   MCV 86.1 78.0 - 100.0 fL   MCH 26.9 26.0 - 34.0 pg   MCHC 31.2 30.0 - 36.0 g/dL   RDW 17.9 (H) 11.5 - 15.5 %   Platelets 202 150 - 400 K/uL  Sedimentation rate     Status: Abnormal   Collection Time: 07/04/17 10:30 AM  Result Value Ref Range   Sed Rate 57 (H) 0 - 16 mm/hr  TSH     Status: None   Collection Time: 07/04/17 12:37 PM  Result Value Ref Range   TSH 2.303 0.350 - 4.500 uIU/mL    Comment: Performed by a 3rd Generation assay with a functional sensitivity of <=0.01 uIU/mL.    Portable Chest 1 View  Result Date: 07/04/2017 CLINICAL DATA:  83 year old male with hypoxia. EXAM: PORTABLE CHEST 1 VIEW COMPARISON:  Chest radiograph dated 08/02/2015 FINDINGS: Minimal left lung base atelectatic changes. There is no focal consolidation, pleural effusion or pneumothorax. The left costophrenic angle has been excluded from the image. There is stable cardiomegaly. Median sternotomy wires and mechanical cardiac valve. Atherosclerotic calcification of the aortic arch. No acute osseous pathology. IMPRESSION: 1. No acute cardiopulmonary process. 2. Stable cardiomegaly. Electronically Signed   By: Anner Crete M.D.   On: 07/04/2017 00:03    ROS Blood pressure (!) 122/53, pulse 60,  temperature 98.2 F (36.8 C), temperature source Oral, resp. rate 18, height _0  (1.676 m), weight 167 lb 12.3 oz (76.1 kg), SpO2 93 %. Physical Exam Physical Examination: General appearance - alert, well appearing, and in no distress Mental status - alert, oriented to person, place, and time Right knee with large effusion. No warmth. Tender diffusely. Pain on attempted range of motion of knee. No pain on hip motion  Procedure- After a sterile prep with betadine, I infiltrated the subQ tisues of the right knee with 3 ml of 1% Lidocaine and aspirated 80 ml of blood. I then injected the joint with 3 ml of 1 % Lidocaine and 80 mg Depomedrol. He tolerated it well and felt better after the procedure.   Assessment/Plan: Right knee osteoarthritis- He has severe OA and is a non-operative candidate. He has done well with serial injections but had a severe flare up this time. Hopefully the aspiration and injection will be of benefit. Follow up in office as outpatient.  Pilar Plate Leea Rambeau 07/04/2017, 2:00 PM

## 2017-07-04 NOTE — Progress Notes (Addendum)
Progress Note  Patient Name: [name] Date of Encounter: 07/04/2017  Primary Cardiologist: mcdowell, allred    Subjective   Breathing is better   At near baselilnie   NO CP Knee felling better though he has  Not stood up yet    Inpatient Medications    Scheduled Meds: . amiodarone  100 mg Oral Daily  . amLODipine  5 mg Oral Daily  . benazepril  40 mg Oral Daily  . carvedilol  3.125 mg Oral BID WC  . docusate sodium  200 mg Oral Daily  . furosemide  40 mg Intravenous Daily  . levothyroxine  50 mcg Oral QAC breakfast  . pantoprazole  40 mg Oral Daily  . potassium chloride SA  20 mEq Oral Daily  . pravastatin  40 mg Oral QPM  . sodium chloride flush  3 mL Intravenous Q12H  . Warfarin - Pharmacist Dosing Inpatient   Does not apply q1800   Continuous Infusions:  PRN Meds:    Vital Signs    Vitals:   07/03/17 1901 07/04/17 0008 07/04/17 0459  BP: 138/62 (!) 121/57 (!) 115/54  Pulse: (!) 57 60 60  Resp: (!) _0 Temp: 99.2 F (37.3 C) 98.7 F (37.1 C) 98.6 F (37 C)  TempSrc: Oral Oral Oral  SpO2: 96% 97% 99%  Weight: 165 lb 6 oz (75 kg)  167 lb 12.3 oz (76.1 kg)  Height: 5' 6" (1.676 m)      Intake/Output Summary (Last 24 hours) at 07/04/2017 0827 Last data filed at 07/04/2017 0535 Gross per 24 hour  Intake 480 ml  Output 800 ml  Net -320 ml   Filed Weights   07/03/17 1901 07/04/17 0459  Weight: 165 lb 6 oz (75 kg) 167 lb 12.3 oz (76.1 kg)    Telemetry    Paced  - Personally Reviewed  ECG      Physical Exam   GEN: No acute distress.   Neck: No JVD Cardiac: RRR, III/VI systolic murmur across precordium  Normal S2  No, rubs, or gallops.  Respiratory: Clear to auscultation bilaterally. GI: Soft, nontender, non-distended  MS: R knee swollen   WArm   No calf edema   Neuro:  Nonfocal  Psych: Normal affect   Labs    Chemistry Recent Labs  Lab 07/03/17 2300 07/04/17 0505  NA 136 138  K 3.1* 3.2*  CL 102 103  CO2 26 27  GLUCOSE  174* 108*  BUN 14 14  CREATININE 1.10 1.01  CALCIUM 8.5* 8.8*  GFRNONAA 59* >60  GFRAA >60 >60  ANIONGAP 8 8     Hematology Recent Labs  Lab 07/03/17 2300 07/04/17 0505  WBC 10.0 9.2  RBC 3.19* 3.09*  HGB 8.4* 8.3*  HCT 27.3* 26.6*  MCV 85.6 86.1  MCH 26.3 26.9  MCHC 30.8 31.2  RDW 17.8* 17.9*  PLT 195 202    Cardiac EnzymesNo results for input(s): TROPONINI in the last 168 hours. No results for input(s): TROPIPOC in the last 168 hours.   BNP Recent Labs  Lab 07/03/17 2300  BNP 695.9*     DDimer No results for input(s): DDIMER in the last 168 hours.   Radiology    Portable Chest 1 View  Result Date: 07/04/2017 CLINICAL DATA:  83 year old male with hypoxia. EXAM: PORTABLE CHEST 1 VIEW COMPARISON:  Chest radiograph dated 08/02/2015 FINDINGS: Minimal left lung base atelectatic changes. There is no focal consolidation, pleural effusion or pneumothorax. The left  costophrenic angle has been excluded from the image. There is stable cardiomegaly. Median sternotomy wires and mechanical cardiac valve. Atherosclerotic calcification of the aortic arch. No acute osseous pathology. IMPRESSION: 1. No acute cardiopulmonary process. 2. Stable cardiomegaly. Electronically Signed   By: Anner Crete M.D.   On: 07/04/2017 00:03    Cardiac Studies     Patient Profile     83 y.o. male   Assessment & Plan    1  Dysynea  Improved from yesterday   I am not sure of trigger He was very hypoxic and T 103  DDimer neg  Dopplers neg for DVT  BNP 435  ESR 74 by report CBC 12.1  INR 2.9   He was [facility] ED with lasix IV x 1 and ABX   Cultures done    On exam, volume looks pretty good  He is feeling better   I would follow  Got 1 dose lasix yesterday  Give one dose   IV again today  I would put back on home meds   [place]  With acuity of presentation would switch to po even though I cannot find what is wrong  Rx Ceftin  500 bid   Not atypical coverage but he  looks better   rx for 4 days    Recheck ESR with amio use   Would not have gotten acutely better though   Would , with such abrupt presentation and temp give PO ABX Repeat CXR  2  Hx CAD  S/p CABG x 2 (SVG to D1, SVG to OM2)  I am not convinced of active angina    3.  R knee  Bakers cyst on USN in [place]  Pt still cant stand  Warm  Swollen  Will discuss with ortho recomm  Pt follows with [facility]   4.  Urol  Pt needs foley if cant stand to urinate  Sees [facility] for hernias  Last seen 1 year ago  Keep foley for right now  May need intermitt straight cath Uro follow upt as outpt.  5   AV dz  S/p AVR 2008 Echo in Dec Mean gradient 16 mm Hx   Og ahead and repeat since at 1 year     6 MV dz  S/p MV repair  7  Hx Afib /flutter S/p MAZE and LAA stapling  No evid afib  On tele   On amiodarone for atrial flutter  Continue coumadin  8  R Knee  Swollen  Pt told he had bakers cyst.    9 SSS  S/p PPM [employer]   Had previous device removed   10Hx endocarditis  S/p PPM extraction and reinsertion  Iam not convinced of infection     WBC is normal   11   CV dz  Mod plaquing of carotids on remoted USN (2009)      For questions or updates, please contact [facility] Please consult [email or web address] for contact info under Cardiology/STEMI.      Signed, Dorris Carnes, MD  07/04/2017, 8:27 AM

## 2017-07-04 NOTE — Progress Notes (Signed)
ANTICOAGULATION CONSULT NOTE - Follow Up Consult  Pharmacy Consult for Warfarin  Indication: atrial fibrillation  No Known Allergies  Patient Measurements: Height: 5\' 6"  (167.6 cm) Weight: 167 lb 12.3 oz (76.1 kg) IBW/kg (Calculated) : 63.8  Vital Signs: Temp: 98.6 F (37 C) (12/09 0459) Temp Source: Oral (12/09 0459) BP: 118/42 (12/09 1047) Pulse Rate: 59 (12/09 1043)  Labs: Recent Labs    07/03/17 2300 07/04/17 0505  HGB 8.4* 8.3*  HCT 27.3* 26.6*  PLT 195 202  LABPROT 31.0* 31.0*  INR 3.01 3.01  CREATININE 1.10 1.01    Estimated Creatinine Clearance: 47.4 mL/min (by C-G formula based on SCr of 1.01 mg/dL).   Medical History: Past Medical History:  Diagnosis Date  . Anemia   . Arthritis   . Atrial flutter (HCC)    Amiodarone therapy, coumadin therapy, prior history of cardioversion and overdrive pacing  . Barrett's esophagus   . Benign prostatic hyperplasia   . Cardiomyopathy    Mixed ischemic and nonischemic with previous EF of 35-40%, most recently in June 2009 normalized to 55-60%. Echo 2/11 EF 55-60%  . Carotid artery disease (HCC)    Less than 50% bilateral internal carotid artery stenosis by Doppler 2009  . Carpal tunnel syndrome   . Chronic diastolic (congestive) heart failure ([place])   . Coronary atherosclerosis of native coronary artery    Status post coronary bypass grafting  . 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 Endocarditis   . Essential hypertension, benign   . Hiatal hernia   . Hyperlipidemia   . Hypothyroidism   . Internal hemorrhoids   . Iron deficiency anemia   . Obstructive sleep apnea   . Osteoporosis   . Paroxysmal atrial fibrillation (HCC)    Amiodarone therapy, status post Cox-Maze procedure but with recurrent atrial fibrillation while on amiodarone August 2012  . Symptomatic bradycardia   . Tubular adenoma of colon   . Valvular heart disease    Status post by  prosthetic aortic valve and mitral valve repair  . Warfarin anticoagulation     Assessment: 83 y/o M transfer from [place] with right knee pain/swelling and hypoxemia. On warfarin PTA for atrial fibrillation. Confirmed with patient that home regimen is 2 mg daily except 3 mg on Monday and Friday - last dose was on 12/7. INR is 3.01. Hgb is stable at 8.3, platelets WNL. On concurrent cefuroxime, which could impact warfarin sensitivity. Received 1 mg dose this morning.   Goal of Therapy:  INR 2-3 Monitor platelets by anticoagulation protocol: Yes   Plan:  -Warfarin 1 mg PO x 1 tonight  -Daily PT/INR -Monitor for bleeding  Doylene Canard, PharmD Clinical Pharmacist  Pager: [phone] Clinical Phone for 07/04/2017 until 3:30pm: [phone] If after 3:30pm, please call main pharmacy at [phone] 07/04/2017,12:04 PM

## 2017-07-05 ENCOUNTER — Encounter (HOSPITAL_COMMUNITY): Payer: Self-pay | Admitting: Nurse Practitioner

## 2017-07-05 ENCOUNTER — Encounter (HOSPITAL_COMMUNITY)
Admission: AD | Disposition: A | Discharge status: Home Health | Payer: Self-pay | Source: Other Acute Inpatient Hospital | Attending: Internal Medicine

## 2017-07-05 ENCOUNTER — Inpatient Hospital Stay (HOSPITAL_COMMUNITY)
Admission: AD | Admit: 2017-07-05 | Discharge: 2017-07-05 | Disposition: A | Discharge status: Home / Self Care | Payer: Medicare Other | Source: Other Acute Inpatient Hospital | Attending: Cardiovascular Disease | Admitting: Cardiovascular Disease

## 2017-07-05 DIAGNOSIS — R7881 Bacteremia: Secondary | ICD-10-CM | POA: Diagnosis present

## 2017-07-05 DIAGNOSIS — Z950 Presence of cardiac pacemaker: Secondary | ICD-10-CM

## 2017-07-05 DIAGNOSIS — I340 Nonrheumatic mitral (valve) insufficiency: Secondary | ICD-10-CM

## 2017-07-05 DIAGNOSIS — D500 Iron deficiency anemia secondary to blood loss (chronic): Secondary | ICD-10-CM

## 2017-07-05 DIAGNOSIS — I2510 Atherosclerotic heart disease of native coronary artery without angina pectoris: Secondary | ICD-10-CM

## 2017-07-05 HISTORY — PX: TEE WITHOUT CARDIOVERSION: SHX5443

## 2017-07-05 LAB — PROTIME-INR
INR: 3.61
Prothrombin Time: 35.7 seconds — ABNORMAL HIGH (ref 11.4–15.2)

## 2017-07-05 SURGERY — ECHOCARDIOGRAM, TRANSESOPHAGEAL
Anesthesia: Moderate Sedation

## 2017-07-05 MED ORDER — FENTANYL CITRATE (PF) 100 MCG/2ML IJ SOLN
INTRAMUSCULAR | Status: AC
  Filled 2017-07-05: qty 2

## 2017-07-05 MED ORDER — BUTAMBEN-TETRACAINE-BENZOCAINE 2-2-14 % EX AERO
INHALATION_SPRAY | CUTANEOUS | Status: DC | PRN
  Administered 2017-07-05: 2 via TOPICAL

## 2017-07-05 MED ORDER — SODIUM CHLORIDE 0.9 % IV SOLN
INTRAVENOUS | Status: AC | PRN
  Administered 2017-07-05: 500 mL via INTRAVENOUS

## 2017-07-05 MED ORDER — MIDAZOLAM HCL 5 MG/ML IJ SOLN
INTRAMUSCULAR | Status: AC
  Filled 2017-07-05: qty 2

## 2017-07-05 MED ORDER — MIDAZOLAM HCL 10 MG/2ML IJ SOLN
INTRAMUSCULAR | Status: DC | PRN
  Administered 2017-07-05: 1 mg via INTRAVENOUS
  Administered 2017-07-05: 2 mg via INTRAVENOUS

## 2017-07-05 MED ORDER — FENTANYL CITRATE (PF) 100 MCG/2ML IJ SOLN
INTRAMUSCULAR | Status: DC | PRN
Start: 2017-07-05 — End: 2017-07-05
  Administered 2017-07-05: 25 ug via INTRAVENOUS

## 2017-07-05 MED ORDER — SODIUM CHLORIDE 0.9 % IV SOLN: INTRAVENOUS | Status: DC

## 2017-07-05 NOTE — Progress Notes (Signed)
ANTICOAGULATION CONSULT NOTE - Follow Up Consult  Pharmacy Consult for Warfarin  Indication: atrial fibrillation  No Known Allergies  Patient Measurements: Height: 5\' 6"  (167.6 cm) Weight: 165 lb 5.5 oz (75 kg)(bed scale) IBW/kg (Calculated) : 63.8  Vital Signs: Temp: 97.6 F (36.4 C) (12/10 0410) Temp Source: Oral (12/10 0410) BP: 107/52 (12/10 0410) Pulse Rate: 60 (12/10 0410)  Labs: Recent Labs    07/03/17 2300 07/04/17 0505 07/05/17 0444  HGB 8.4* 8.3*  --   HCT 27.3* 26.6*  --   PLT 195 202  --   LABPROT 31.0* 31.0* 35.7*  INR 3.01 3.01 3.61  CREATININE 1.10 1.01  --    Estimated Creatinine Clearance: 47.4 mL/min (by C-G formula based on SCr of 1.01 mg/dL).  Medical History: Past Medical History:  Diagnosis Date  . Anemia   . Arthritis   . Atrial flutter (HCC)    Amiodarone therapy, coumadin therapy, prior history of cardioversion and overdrive pacing  . Barrett's esophagus   . Benign prostatic hyperplasia   . Cardiomyopathy    Mixed ischemic and nonischemic with previous EF of 35-40%, most recently in June 2009 normalized to 55-60%. Echo 2/11 EF 55-60%  . Carotid artery disease (HCC)    Less than 50% bilateral internal carotid artery stenosis by Doppler 2009  . Carpal tunnel syndrome   . Chronic diastolic (congestive) heart failure ([place])   . Coronary atherosclerosis of native coronary artery    Status post coronary bypass grafting  . 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 Endocarditis   . Essential hypertension, benign   . Hiatal hernia   . Hyperlipidemia   . Hypothyroidism   . Internal hemorrhoids   . Iron deficiency anemia   . Obstructive sleep apnea   . Osteoporosis   . Paroxysmal atrial fibrillation (HCC)    Amiodarone therapy, status post Cox-Maze procedure but with recurrent atrial fibrillation while on amiodarone August 2012  . Symptomatic bradycardia   . Tubular adenoma of colon    . Valvular heart disease    Status post by prosthetic aortic valve and mitral valve repair  . Warfarin anticoagulation    Assessment: 83 y/o M transfer from [place] with right knee pain/swelling and hypoxemia. On warfarin PTA for atrial fibrillation. Confirmed with patient that home regimen is 2 mg daily except 3 mg on Monday and Friday - last dose was on 12/7. INR is 3.01 on admit. Hgb 8.3 yesterday, platelets WNL, no new CBC this morning. On concurrent amiodarone/cefuroxime, which could impact warfarin sensitivity. Pt received total of warfarin 2mg  yesterday.  INR up today: 3.61  Goal of Therapy:  INR 2-3 Monitor platelets by anticoagulation protocol: Yes   Plan:  -HOLD warfarin tonight -Daily PT/INR -Monitor for bleeding  Georga Bora, PharmD Clinical Pharmacist 07/05/2017 9:18 AM

## 2017-07-05 NOTE — Progress Notes (Signed)
Received op note from [facility] 2008 had surgery with Dr Norm Parcel 21 mm [place]pent[place] Oletta Lamas Valve MV repair using Sequin Ring  [place]

## 2017-07-05 NOTE — Progress Notes (Signed)
Spoke with Dr. Victorino December with [facility] regarding increased pain on rounds s/p Baker cyst drainage yesterday 07/04/2017. Pt has had prior steroid injection which is really the only recommendation at this point. Ortho to follow.

## 2017-07-05 NOTE — Progress Notes (Signed)
VSS during 7 a to 7 p shift, unable to wean patient to room air, 88% on room air, 93% on 2 liters. some improvement in pain per patient..  Foley catheter remains in place, patient has some urologic issues and cardiology notes specifies to leave catheter in place for now.  Wife at bedside entire shift.

## 2017-07-05 NOTE — Progress Notes (Signed)
Progress Note  Patient Name: [name] Date of Encounter: 07/05/2017  Primary Cardiologist: Domenic Polite and Lovena Le  Subjective   No dyspnea Right knee feels better Multiple non cardiac issues need to be addressed Has been essentially non ambulatory for a week Previously used walker. Anemia requiring FESO4 q 3 months. Unable to urinate well due to phimosis     Inpatient Medications    Scheduled Meds: . amiodarone  100 mg Oral Daily  . amLODipine  5 mg Oral Daily  . benazepril  40 mg Oral Daily  . carvedilol  3.125 mg Oral BID WC  . cefUROXime  500 mg Oral BID WC  . docusate sodium  200 mg Oral Daily  . furosemide  40 mg Oral Daily  . furosemide  80 mg Oral Daily  . levothyroxine  50 mcg Oral QAC breakfast  . pantoprazole  40 mg Oral Daily  . potassium chloride SA  20 mEq Oral Daily  . pravastatin  40 mg Oral QPM  . sodium chloride flush  3 mL Intravenous Q12H  . Warfarin - Pharmacist Dosing Inpatient   Does not apply q1800   Continuous Infusions:  PRN Meds:    Vital Signs    Vitals:   07/04/17 1200 07/04/17 1702 07/04/17 1937 07/05/17 0410  BP: (!) 122/53 (!) 122/58 (!) 104/45 (!) 107/52  Pulse: 60 61 91 60  Resp: 18  20 18   Temp: 98.2 F (36.8 C)  99.6 F (37.6 C) 97.6 F (36.4 C)  TempSrc: Oral  Oral Oral  SpO2: 93% 93% 96% 99%  Weight:    165 lb 5.5 oz (75 kg)  Height:        Intake/Output Summary (Last 24 hours) at 07/05/2017 9563 Last data filed at 07/05/2017 0409 Gross per 24 hour  Intake 720 ml  Output 1300 ml  Net -580 ml   Filed Weights   07/03/17 1901 07/04/17 0459 07/05/17 0410  Weight: 165 lb 6 oz (75 kg) 167 lb 12.3 oz (76.1 kg) 165 lb 5.5 oz (75 kg)    Telemetry    Paced  - Personally Reviewed  ECG      Physical Exam   Affect appropriate Chronically ill elderly male  HEENT: normal Neck supple with no adenopathy JVP normal no bruits no thyromegaly Lungs clear with no wheezing and good diaphragmatic motion Heart:  S1/S2  AS murmur through prosthetic valve no AR  murmur, no rub, gallop or click PMI normal pacer under right clavicle  Abdomen: benighn, BS positve, no tenderness, no AAA no bruit.  No HSM or HJR Distal pulses intact with no bruits No edema Neuro non-focal Post left TKR Right knee with less effusion post drainage still with decreased mobility Foley catheter in place    Labs    Chemistry Recent Labs  Lab 07/03/17 2300 07/04/17 0505  NA 136 138  K 3.1* 3.2*  CL 102 103  CO2 26 27  GLUCOSE 174* 108*  BUN 14 14  CREATININE 1.10 1.01  CALCIUM 8.5* 8.8*  GFRNONAA 59* >60  GFRAA >60 >60  ANIONGAP 8 8     Hematology Recent Labs  Lab 07/03/17 2300 07/04/17 0505  WBC 10.0 9.2  RBC 3.19* 3.09*  HGB 8.4* 8.3*  HCT 27.3* 26.6*  MCV 85.6 86.1  MCH 26.3 26.9  MCHC 30.8 31.2  RDW 17.8* 17.9*  PLT 195 202    Cardiac EnzymesNo results for input(s): TROPONINI in the last 168 hours. No results for input(s):  TROPIPOC in the last 168 hours.   BNP Recent Labs  Lab 07/03/17 2300  BNP 695.9*     DDimer No results for input(s): DDIMER in the last 168 hours.   Radiology    Portable Chest 1 View  Result Date: 07/04/2017 CLINICAL DATA:  83 year old male with hypoxia. EXAM: PORTABLE CHEST 1 VIEW COMPARISON:  Chest radiograph dated 08/02/2015 FINDINGS: Minimal left lung base atelectatic changes. There is no focal consolidation, pleural effusion or pneumothorax. The left costophrenic angle has been excluded from the image. There is stable cardiomegaly. Median sternotomy wires and mechanical cardiac valve. Atherosclerotic calcification of the aortic arch. No acute osseous pathology. IMPRESSION: 1. No acute cardiopulmonary process. 2. Stable cardiomegaly. Electronically Signed   By: Anner Crete M.D.   On: 07/04/2017 00:03    Cardiac Studies  Study Conclusions  - Left ventricle: The cavity size was normal. Wall thickness was   increased in a pattern of mild LVH. Systolic function  was normal.   The estimated ejection fraction was in the range of 55% to 60%.   Cannot evaluate diastolic function is setting of MV repair/anular   ring. - Aortic valve: There is a bioprosthetic AV of unknown type and   size. There is a severe gradient across the AV bioprosthetic   valve. Mean gradient (S): 42 mm Hg. Peak gradient (S): 77 mm Hg. - Mitral valve: There was mild regurgitation. There is a moderate   gradient across the repaired MV. Mean gradient 6 mmHg. - Left atrium: The atrium was severely dilated. - Right ventricle: The cavity size was mildly dilated. - Right atrium: The atrium was severely dilated. - Tricuspid valve: There was moderate regurgitation. - Pulmonary arteries: Systolic pressure was moderately increased.   PA peak pressure: 63 mm Hg (S).  Impressions:  - Increased gradients across the bioprosthetic aortic valve   compared to 07/02/16 study. The valve is not visualized well,   cannot determine the associated change in morphology/function.   Consider TEE.   Patient Profile     83 y.o. male   Assessment & Plan    1  Dysynea  Etiology not clear CXR normal lungs clear BNP minimally elevated Suspect progression of valve disease  Contributes. AVR is 81 years old and gradients significantly elevated since 2017. Discussed need for TEE to r/o infection Assess valves and pacer wires Have scheduled with [facility] Will continue lasix and antibiotics per IM follow CXR  D/C Amiodarone   2  Hx CAD  S/p CABG x 2 (SVG to D1, SVG to OM2)  No active angina   3.  R knee  Bakers cyst on USN in [place]  Post drainage by ortho yesterday ? Bloody feels much better but Needs PT/OT and ortho f/u   4.  Urol  Pt needs foley if cant stand to urinate  Sees Grapey for hernias  Last seen 1 year ago  Keep foley for right now  May Will need urology to see as inpatient ? Suprapubic catheter vs procedure for phimosis   5   AVR:  TEE today for elevated gradients and tissue valve  degeneration   6 MVrepair   S/p MV repair TEE today   7  Hx Afib /flutter S/p MAZE and LAA stapling  No evid afib  On tele  Continue coumadin     8SSS  S/p PPM Medtronic   Had previous device removed   Will try to transfer to medical service as he needs PT/OT ,  ortho issues, anemia and urology issues are what will Keep him in hospital. No further cardiac w/u planned in hospital after TEE today   Discussed at length with patient and wife He cannot get back to [facility] due to weather anyway and suspect He would benefit from possible inpatient rehab    Signed, Jenkins Rouge, MD  07/05/2017, 8:23 AM

## 2017-07-05 NOTE — Interval H&P Note (Signed)
History and Physical Interval Note:  07/05/2017 1:01 PM  [name]  has presented today for surgery, with the diagnosis of bacteremia  The various methods of treatment have been discussed with the patient and family. After consideration of risks, benefits and other options for treatment, the patient has consented to  Procedure(s): TRANSESOPHAGEAL ECHOCARDIOGRAM (TEE) (N/A) as a surgical intervention .  The patient's history has been reviewed, patient examined, no change in status, stable for surgery.  I have reviewed the patient's chart and labs.  Questions were answered to the patient's satisfaction.     [name]

## 2017-07-05 NOTE — Progress Notes (Signed)
Patient returned from Endoscopy, s/p TEE procedure.  Wife at bedside.  Patient's diet resumed and wife assisted with how to order his meals.  Patient denies pain.

## 2017-07-05 NOTE — H&P (View-Only) (Signed)
Progress Note  Patient Name: [name] Date of Encounter: 07/05/2017  Primary Cardiologist: Domenic Polite and Lovena Le  Subjective   No dyspnea Right knee feels better Multiple non cardiac issues need to be addressed Has been essentially non ambulatory for a week Previously used walker. Anemia requiring FESO4 q 3 months. Unable to urinate well due to phimosis     Inpatient Medications    Scheduled Meds: . amiodarone  100 mg Oral Daily  . amLODipine  5 mg Oral Daily  . benazepril  40 mg Oral Daily  . carvedilol  3.125 mg Oral BID WC  . cefUROXime  500 mg Oral BID WC  . docusate sodium  200 mg Oral Daily  . furosemide  40 mg Oral Daily  . furosemide  80 mg Oral Daily  . levothyroxine  50 mcg Oral QAC breakfast  . pantoprazole  40 mg Oral Daily  . potassium chloride SA  20 mEq Oral Daily  . pravastatin  40 mg Oral QPM  . sodium chloride flush  3 mL Intravenous Q12H  . Warfarin - Pharmacist Dosing Inpatient   Does not apply q1800   Continuous Infusions:  PRN Meds:    Vital Signs    Vitals:   07/04/17 1200 07/04/17 1702 07/04/17 1937 07/05/17 0410  BP: (!) 122/53 (!) 122/58 (!) 104/45 (!) 107/52  Pulse: 60 61 91 60  Resp: 18  20 18   Temp: 98.2 F (36.8 C)  99.6 F (37.6 C) 97.6 F (36.4 C)  TempSrc: Oral  Oral Oral  SpO2: 93% 93% 96% 99%  Weight:    165 lb 5.5 oz (75 kg)  Height:        Intake/Output Summary (Last 24 hours) at 07/05/2017 7846 Last data filed at 07/05/2017 0409 Gross per 24 hour  Intake 720 ml  Output 1300 ml  Net -580 ml   Filed Weights   07/03/17 1901 07/04/17 0459 07/05/17 0410  Weight: 165 lb 6 oz (75 kg) 167 lb 12.3 oz (76.1 kg) 165 lb 5.5 oz (75 kg)    Telemetry    Paced  - Personally Reviewed  ECG      Physical Exam   Affect appropriate Chronically ill elderly male  HEENT: normal Neck supple with no adenopathy JVP normal no bruits no thyromegaly Lungs clear with no wheezing and good diaphragmatic motion Heart:  S1/S2  AS murmur through prosthetic valve no AR  murmur, no rub, gallop or click PMI normal pacer under right clavicle  Abdomen: benighn, BS positve, no tenderness, no AAA no bruit.  No HSM or HJR Distal pulses intact with no bruits No edema Neuro non-focal Post left TKR Right knee with less effusion post drainage still with decreased mobility Foley catheter in place    Labs    Chemistry Recent Labs  Lab 07/03/17 2300 07/04/17 0505  NA 136 138  K 3.1* 3.2*  CL 102 103  CO2 26 27  GLUCOSE 174* 108*  BUN 14 14  CREATININE 1.10 1.01  CALCIUM 8.5* 8.8*  GFRNONAA 59* >60  GFRAA >60 >60  ANIONGAP 8 8     Hematology Recent Labs  Lab 07/03/17 2300 07/04/17 0505  WBC 10.0 9.2  RBC 3.19* 3.09*  HGB 8.4* 8.3*  HCT 27.3* 26.6*  MCV 85.6 86.1  MCH 26.3 26.9  MCHC 30.8 31.2  RDW 17.8* 17.9*  PLT 195 202    Cardiac EnzymesNo results for input(s): TROPONINI in the last 168 hours. No results for input(s):  TROPIPOC in the last 168 hours.   BNP Recent Labs  Lab 07/03/17 2300  BNP 695.9*     DDimer No results for input(s): DDIMER in the last 168 hours.   Radiology    Portable Chest 1 View  Result Date: 07/04/2017 CLINICAL DATA:  83 year old male with hypoxia. EXAM: PORTABLE CHEST 1 VIEW COMPARISON:  Chest radiograph dated 08/02/2015 FINDINGS: Minimal left lung base atelectatic changes. There is no focal consolidation, pleural effusion or pneumothorax. The left costophrenic angle has been excluded from the image. There is stable cardiomegaly. Median sternotomy wires and mechanical cardiac valve. Atherosclerotic calcification of the aortic arch. No acute osseous pathology. IMPRESSION: 1. No acute cardiopulmonary process. 2. Stable cardiomegaly. Electronically Signed   By: Anner Crete M.D.   On: 07/04/2017 00:03    Cardiac Studies  Study Conclusions  - Left ventricle: The cavity size was normal. Wall thickness was   increased in a pattern of mild LVH. Systolic function  was normal.   The estimated ejection fraction was in the range of 55% to 60%.   Cannot evaluate diastolic function is setting of MV repair/anular   ring. - Aortic valve: There is a bioprosthetic AV of unknown type and   size. There is a severe gradient across the AV bioprosthetic   valve. Mean gradient (S): 42 mm Hg. Peak gradient (S): 77 mm Hg. - Mitral valve: There was mild regurgitation. There is a moderate   gradient across the repaired MV. Mean gradient 6 mmHg. - Left atrium: The atrium was severely dilated. - Right ventricle: The cavity size was mildly dilated. - Right atrium: The atrium was severely dilated. - Tricuspid valve: There was moderate regurgitation. - Pulmonary arteries: Systolic pressure was moderately increased.   PA peak pressure: 63 mm Hg (S).  Impressions:  - Increased gradients across the bioprosthetic aortic valve   compared to 07/02/16 study. The valve is not visualized well,   cannot determine the associated change in morphology/function.   Consider TEE.   Patient Profile     83 y.o. male   Assessment & Plan    1  Dysynea  Etiology not clear CXR normal lungs clear BNP minimally elevated Suspect progression of valve disease  Contributes. AVR is 81 years old and gradients significantly elevated since 2017. Discussed need for TEE to r/o infection Assess valves and pacer wires Have scheduled with [facility] today Will continue lasix and antibiotics per IM follow CXR  D/C Amiodarone   2  Hx CAD  S/p CABG x 2 (SVG to D1, SVG to OM2)  No active angina   3.  R knee  Bakers cyst on USN in [place]  Post drainage by ortho yesterday ? Bloody feels much better but Needs PT/OT and ortho f/u   4.  Urol  Pt needs foley if cant stand to urinate  Sees Grapey for hernias  Last seen 1 year ago  Keep foley for right now  May Will need urology to see as inpatient ? Suprapubic catheter vs procedure for phimosis   5   AVR:  TEE today for elevated gradients and tissue valve  degeneration   6 MVrepair   S/p MV repair TEE today   7  Hx Afib /flutter S/p MAZE and LAA stapling  No evid afib  On tele  Continue coumadin     8SSS  S/p PPM [employer]tr[employer]   Had previous device removed   Will try to transfer to medical service as he needs PT/OT ,  ortho issues, anemia and urology issues are what will Keep him in hospital. No further cardiac w/u planned in hospital after TEE today   Discussed at length with patient and wife He cannot get back to [facility] due to weather anyway and suspect He would benefit from possible inpatient rehab    Signed, Jenkins Rouge, MD  07/05/2017, 8:23 AM

## 2017-07-05 NOTE — Progress Notes (Signed)
Talked to Dr. Jeffie Pollock (on call for Dr. Risa Grill)  and he recommends leaving foley in at discharge and have pt follow up within 2 weeks of discharge for management.

## 2017-07-05 NOTE — Plan of Care (Signed)
  Health Behavior/Discharge Planning: Ability to manage health-related needs will improve 07/05/2017 0742 - Not Progressing by Rolm Baptise, RN   Clinical Measurements: Will remain free from infection 07/05/2017 1916 - Progressing by Rolm Baptise, RN Diagnostic test results will improve 07/05/2017 [phone] - Progressing by Rolm Baptise, RN   Nutrition: Adequate nutrition will be maintained 07/05/2017 0742 - Progressing by Rolm Baptise, RN   Coping: Level of anxiety will decrease 07/05/2017 0742 - Progressing by Rolm Baptise, RN

## 2017-07-05 NOTE — Op Note (Signed)
INDICATIONS: bacteremia; prosthetic AVR with elevated gradients  PROCEDURE:   Informed consent was obtained prior to the procedure. The risks, benefits and alternatives for the procedure were discussed and the patient comprehended these risks.  Risks include, but are not limited to, cough, sore throat, vomiting, nausea, somnolence, esophageal and stomach trauma or perforation, bleeding, low blood pressure, aspiration, pneumonia, infection, trauma to the teeth and death.    After a procedural time-out, the oropharynx was anesthetized with 20% benzocaine spray.   During this procedure the patient was administered a total of Versed 3 mg and Fentanyl 25 mg to achieve and maintain moderate conscious sedation.  The patient's heart rate, blood pressure, and oxygen saturationweare monitored continuously during the procedure. The period of conscious sedation was 14 minutes, of which I was present face-to-face 100% of this time.  The transesophageal probe was inserted in the esophagus and stomach without difficulty and multiple views were obtained.  The patient was kept under observation until the patient left the procedure room.  The patient left the procedure room in stable condition.   Agitated microbubble saline contrast was not administered.  COMPLICATIONS:    There were no immediate complications.  FINDINGS:  The aortic valve prosthesis leaflets open well, without meaningful degenerative changes or restricted motion. NO AI. The mitral annuloplasty ring is well seated, there is 1-2+ highly eccentric MR directed towards the septum and the aortic root. 3+ TR. Severely dilated right atrium, mildly dilated left atrium. No vegetations are seen on the prosthetic AV, the repaired MV or the pacemaker leads. Normal LV wall motion and overall function. No PFO/ASD. Mild aortic atherosclerosis.   RECOMMENDATIONS:     Compare side by side with TTE. Suspect elevated AV gradients may be due to sampling  error (for example the eccentric MR).  Time Spent Directly with the Patient:  30 minutes   [name] 07/05/2017, 2:46 PM

## 2017-07-06 ENCOUNTER — Other Ambulatory Visit: Payer: Self-pay | Admitting: Cardiology

## 2017-07-06 ENCOUNTER — Ambulatory Visit: Payer: Medicare Other | Admitting: Cardiology

## 2017-07-06 LAB — BASIC METABOLIC PANEL
Anion gap: 7 (ref 5–15)
BUN: 33 mg/dL — ABNORMAL HIGH (ref 6–20)
CO2: 29 mmol/L (ref 22–32)
Calcium: 8.9 mg/dL (ref 8.9–10.3)
Chloride: 104 mmol/L (ref 101–111)
Creatinine, Ser: 1.2 mg/dL (ref 0.61–1.24)
GFR calc Af Amer: >60 mL/min (ref >60)
GFR calc non Af Amer: 53 mL/min — ABNORMAL LOW (ref >60)
Glucose, Bld: 109 mg/dL — ABNORMAL HIGH (ref 65–99)
Potassium: 3.7 mmol/L (ref 3.5–5.1)
Sodium: 140 mmol/L (ref 135–145)

## 2017-07-06 LAB — CBC
HCT: 28 % — ABNORMAL LOW (ref 39.0–52.0)
Hemoglobin: 8.5 g/dL — ABNORMAL LOW (ref 13.0–17.0)
MCH: 26.6 pg (ref 26.0–34.0)
MCHC: 30.4 g/dL (ref 30.0–36.0)
MCV: 87.5 fL (ref 78.0–100.0)
Platelets: 206 10*3/uL (ref 150–400)
RBC: 3.2 MIL/uL — ABNORMAL LOW (ref 4.22–5.81)
RDW: 18.2 % — ABNORMAL HIGH (ref 11.5–15.5)
WBC: 8.9 10*3/uL (ref 4.0–10.5)

## 2017-07-06 LAB — PROTIME-INR
INR: 4.41
INR: 3.86
Prothrombin Time: 42.2 seconds — ABNORMAL HIGH (ref 11.4–15.2)
Prothrombin Time: 37.7 seconds — ABNORMAL HIGH (ref 11.4–15.2)

## 2017-07-06 MED ORDER — BISACODYL 10 MG RE SUPP
10.0000 mg | Freq: Once | RECTAL | Status: AC
  Administered 2017-07-06: 10 mg via RECTAL
  Filled 2017-07-06: qty 1

## 2017-07-06 MED ORDER — CEFUROXIME AXETIL 500 MG PO TABS: 500.0000 mg | ORAL_TABLET | Freq: Two times a day (BID) | ORAL | 0 refills | Status: DC

## 2017-07-06 MED ORDER — LACTULOSE 10 GM/15ML PO SOLN
30.0000 g | Freq: Once | ORAL | Status: AC
  Administered 2017-07-06: 30 g via ORAL
  Filled 2017-07-06: qty 45

## 2017-07-06 NOTE — Progress Notes (Signed)
ANTICOAGULATION CONSULT NOTE - Follow Up Consult  Pharmacy Consult for Warfarin  Indication: atrial fibrillation  No Known Allergies  Patient Measurements: Height: 5\' 6"  (167.6 cm) Weight: 162 lb 11.2 oz (73.8 kg)(scale b) IBW/kg (Calculated) : 63.8  Vital Signs: Temp: 97.6 F (36.4 C) (12/11 0612) Temp Source: Oral (12/11 0612) BP: 109/65 (12/11 0833) Pulse Rate: 60 (12/11 0833)  Labs: Recent Labs    07/03/17 2300 07/04/17 0505 07/05/17 0444 07/06/17 0547  HGB 8.4* 8.3*  --  8.5*  HCT 27.3* 26.6*  --  28.0*  PLT 195 202  --  206  LABPROT 31.0* 31.0* 35.7* 42.2*  INR 3.01 3.01 3.61 4.41*  CREATININE 1.10 1.01  --  1.20   Estimated Creatinine Clearance: 39.9 mL/min (by C-G formula based on SCr of 1.2 mg/dL).  Medical History: Past Medical History:  Diagnosis Date  . Anemia   . Arthritis   . Atrial flutter (HCC)    Amiodarone therapy, coumadin therapy, prior history of cardioversion and overdrive pacing  . Barrett's esophagus   . Benign prostatic hyperplasia   . Cardiomyopathy    Mixed ischemic and nonischemic with previous EF of 35-40%, most recently in June 2009 normalized to 55-60%. Echo 2/11 EF 55-60%  . Carotid artery disease (HCC)    Less than 50% bilateral internal carotid artery stenosis by Doppler 2009  . Carpal tunnel syndrome   . Chronic diastolic (congestive) heart failure ([place])   . Coronary atherosclerosis of native coronary artery    Status post coronary bypass grafting  . 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 Endocarditis   . Essential hypertension, benign   . Hiatal hernia   . Hyperlipidemia   . Hypothyroidism   . Internal hemorrhoids   . Iron deficiency anemia   . Obstructive sleep apnea   . Osteoporosis   . Paroxysmal atrial fibrillation (HCC)    Amiodarone therapy, status post Cox-Maze procedure but with recurrent atrial fibrillation while on amiodarone August 2012  .  Symptomatic bradycardia   . Tubular adenoma of colon   . Valvular heart disease    Status post by prosthetic aortic valve and mitral valve repair  . Warfarin anticoagulation    Assessment: 83 y/o M transfer from [place] with right knee pain/swelling and hypoxemia. On warfarin PTA for atrial fibrillation. Confirmed with patient that home regimen is 2 mg daily except 3 mg on Monday and Friday - last dose was on 12/7. INR is 3.01 on admit. Hgb 8.5, platelets WNL. On concurrent amiodarone/cefuroxime, which could impact warfarin sensitivity.   INR up after dose held 12/10: 3.61>>4.41. Patient was NPO for TEE starting 12/9 through 12/10, diet restarted yesterday evening. Right knee reported to be bloody post baker cyst drainage.   Goal of Therapy:  INR 2-3 Monitor platelets by anticoagulation protocol: Yes   Plan:  -HOLD warfarin tonight -Daily PT/INR -Monitor for bleeding  Georga Bora, PharmD Clinical Pharmacist 07/06/2017 8:45 AM

## 2017-07-06 NOTE — Progress Notes (Addendum)
Reviewed discharge instructions/medications with pt and pt's wife. Explained to hold Coumadin for two days and get lab work on Friday. Pt is stable and ready for discharge. Placed leg bag on pt for foley. Showed pt and wife how to empty.

## 2017-07-06 NOTE — Plan of Care (Signed)
  Clinical Measurements: Ability to maintain clinical measurements within normal limits will improve 07/06/2017 1051 - Progressing by Ilene Qua, RN 07/06/2017 1051 - Progressing by Ilene Qua, RN Will remain free from infection 07/06/2017 1051 - Progressing by Ilene Qua, RN 07/06/2017 1051 - Progressing by Ilene Qua, RN Diagnostic test results will improve 07/06/2017 1051 - Progressing by Ilene Qua, RN 07/06/2017 1051 - Progressing by Ilene Qua, RN Respiratory complications will improve 07/06/2017 1051 - Progressing by Ilene Qua, RN 07/06/2017 1051 - Progressing by Ilene Qua, RN Cardiovascular complication will be avoided 07/06/2017 1051 - Progressing by Ilene Qua, RN 07/06/2017 1051 - Progressing by Ilene Qua, RN   Clinical Measurements: Will remain free from infection 07/06/2017 1051 - Progressing by Ilene Qua, RN 07/06/2017 1051 - Progressing by Ilene Qua, RN   Clinical Measurements: Diagnostic test results will improve 07/06/2017 1051 - Progressing by Ilene Qua, RN 07/06/2017 1051 - Progressing by Ilene Qua, RN   Clinical Measurements: Respiratory complications will improve 07/06/2017 1051 - Progressing by Ilene Qua, RN 07/06/2017 1051 - Progressing by Ilene Qua, RN   Clinical Measurements: Cardiovascular complication will be avoided 07/06/2017 1051 - Progressing by Ilene Qua, RN 07/06/2017 1051 - Progressing by Ilene Qua, RN   Activity: Risk for activity intolerance will decrease 07/06/2017 1051 - Progressing by Ilene Qua, RN 07/06/2017 1051 - Progressing by Ilene Qua, RN   Nutrition: Adequate nutrition will be maintained 07/06/2017 1051 - Progressing by Ilene Qua, RN 07/06/2017 1051 - Progressing by Ilene Qua, RN   Coping: Level of anxiety will decrease 07/06/2017 1051 - Progressing by Ilene Qua, RN 07/06/2017 1051 - Progressing by Ilene Qua,  RN   Elimination: Will not experience complications related to bowel motility 07/06/2017 1051 - Progressing by Ilene Qua, RN 07/06/2017 1051 - Progressing by Ilene Qua, RN Will not experience complications related to urinary retention 07/06/2017 1051 - Progressing by Ilene Qua, RN 07/06/2017 1051 - Progressing by Ilene Qua, RN   Safety: Ability to remain free from injury will improve 07/06/2017 1051 - Progressing by Ilene Qua, RN 07/06/2017 1051 - Progressing by Ilene Qua, RN   Skin Integrity: Risk for impaired skin integrity will decrease 07/06/2017 1051 - Progressing by Ilene Qua, RN 07/06/2017 1051 - Progressing by Ilene Qua, RN   Education: Ability to demonstrate management of disease process will improve 07/06/2017 1051 - Progressing by Ilene Qua, RN 07/06/2017 1051 - Progressing by Ilene Qua, RN Ability to verbalize understanding of medication therapies will improve 07/06/2017 1051 - Progressing by Ilene Qua, RN 07/06/2017 1051 - Progressing by Ilene Qua, RN   Education: Ability to verbalize understanding of medication therapies will improve 07/06/2017 1051 - Progressing by Ilene Qua, RN 07/06/2017 1051 - Progressing by Ilene Qua, RN   Activity: Capacity to carry out activities will improve 07/06/2017 1051 - Progressing by Ilene Qua, RN 07/06/2017 1051 - Progressing by Ilene Qua, RN   Cardiac: Ability to achieve and maintain adequate cardiopulmonary perfusion will improve 07/06/2017 1051 - Progressing by Ilene Qua, RN 07/06/2017 1051 - Progressing by Ilene Qua, RN

## 2017-07-06 NOTE — Progress Notes (Addendum)
Patient Demographics:    [name], is a 83 y.o. male, DOB - [birth year], HER:[id number]  Admit date - 07/03/2017   Admitting Physician Fay Records, MD  Outpatient Primary MD for the patient is Eber Hong, MD  LOS - 3  Dr Jenkins Rouge (cardiology) requested hospitalist consult for comorbid conditions       Subjective:    [name] today has no fevers, no emesis,  No chest pain,  Wife at bedside, questions answered, patient is able to bear weight on right knee today, no chest pains , no palpitations, no shortness of breath at rest  Assessment  & Plan :    Active Problems:   Obstructive sleep apnea   Hypertensive cardiovascular disease   Coronary atherosclerosis of native coronary artery   Warfarin anticoagulation   Paroxysmal atrial fibrillation (HCC)   Valvular heart disease   Pacemaker   Anemia   Chronic diastolic CHF (congestive heart failure) (HCC)   Iron deficiency anemia due to chronic blood loss   Hypoxia   Bacteremia  Brief Summary:- [name] is an 83 yo man with PMH chronic diastolic heart failure, CAD s/p CABGx2 (SVG-D1 and SVG-OM2), history of aortic valve regurgitation s/p bioprosthetic AVR (76mm CE) and mitral valve regurgitation s/p MV repair, afib s/p MAZE and LA appendage stapling (all done by Dr. Norm Parcel at [facility] 03/2007), hx of sick sinus syndrome s/p PPM, history of endocarditis s/p PPM extraction and reinsertion 2012, hypertension who was transferred from [facility] and  admitted to cardiology service on 07/03/2017. The patient's chief complaint is right knee pain and swelling, but at [facility] he was found to be hypoxic on room air and also spiked a fever, at at [facility] ED on 07/03/17 he was treated with IV Lasix, flu test was negative, d-dimer was negative,   BNP of 425, INR 2.9. Plan is for d/c home with Home health PT and home health RN- due to weakness/Rt knee  OA/CHF/Phimosis with foley/sob with activity   TEE 07/05/17 - Impressions: LV EF 55 to 60 %,  The very high transvalvular gradients through the aortic valve   prosthesis, reported on the transthoracic study, are not  confirmed on the current study, but the gradients are   nevertheless abnormally high.  No evidence of vegetation or perivalvular abscess.  Plan:- 1)HFpEF-patient was admitted with acute exacerbation of chronic diastolic dysfunction CHF with hypoxia,  improved significantly with diuresis, hypoxia-resolved, oxygen sats on 07/06/2017 is 97% on room air. Had TEE by Dr Sallyanne Kuster on 07/05/17, diuretic therapy as per cardiology service, last known EF 55-60%  2)Rt Knee pain-previously treated with , cortisone and Euflexxa. pt has advanced/severe OA of Rt Knee, deemed not to be a candidate for knee replacement due to comorbid conditions and problems with inability to bear weight well on left leg. Pt had Rt knee aspiration with 80 mL of joint fluid removed on 07/04/17, Depo-Medrol/cortisone injection injected on 07/04/17 by Dr Wynelle Link.  Right knee pain is better.  Patient also has a Baker's cyst, physical therapy and outpatient follow-up with orthopedic surgeon advised   3)Phimosis-voiding problems, urology consult appreciated, urology recommended keeping Foley catheter in place until patient can be seen as outpatient in a couple of weeks [facility]  4)H/o Afib/Flutter- S/p MAZE and LAA stapling, chronic anticoagulation-INR trending up despite holding Coumadin due to n.p.o. status, repeat INR 2 PM on 07/06/2017, outpatient follow-up with PCP for management of Coumadin therapy as advised by cardiology service.  Continue amiodarone 100 mg daily, Coreg 3.125 mg twice daily for rate control  5)Chronic Anemia-continue iron infusions every 3 weeks, she had extensive GI evaluation previously  6)AVR/MVrepair- s/p TEE on 07/05/17 by Dr Sallyanne Kuster noted above  7)H/o Sick Sinus syndrome- S/p PPM  Medtronic placement to right subclavian area, previous pacemaker in the left subclavian area was removed  8)H/o CAD  S/p CABG x 2 (SVG to D1, SVG to OM2) -no chest pain, No ACS type symptoms at this time, continue Coreg, pravastatin and Coumadin as ordered  9)HTN-stable, continue amlodipine 5 mg daily, Coreg 3.125 mg twice daily and benazepril 40 mg daily, diuretics per cardiology service  10)Hyopothyroidism-continue levothyroxine 50 mcg daily,.  Repeat TSH is 2.3  11)Obesity/OSA-CPAP nightly  12)Constipation- Lactulose and dulcolax as ordered   Thank you Dr Jenkins Rouge (cardiology) for this consult  Request for comorbid conditions     Code Status : DNR  Disposition Plan  : Awaiting physical therapy evaluation, possible discharge home by cardiology service with home health PT and home health RN on 07/06/2017- due to  weakness/Rt knee OA/CHF/Phimosis with foley/sob with activity. Decision and timing of Discharge as per Cardiology service, please see discharge med Rec and discharge instructions as per cardiology service.   Consults  :  Cardiology, urology, orthopedics, Phy therapy, social work   DVT Prophylaxis  :  Coumadin  Lab Results  Component Value Date   PLT 206 07/06/2017    Inpatient Medications  Scheduled Meds: . amiodarone  100 mg Oral Daily  . amLODipine  5 mg Oral Daily  . benazepril  40 mg Oral Daily  . bisacodyl  10 mg Rectal Once  . carvedilol  3.125 mg Oral BID WC  . cefUROXime  500 mg Oral BID WC  . docusate sodium  200 mg Oral Daily  . furosemide  40 mg Oral Daily  . furosemide  80 mg Oral Daily  . lactulose  30 g Oral Once  . levothyroxine  50 mcg Oral QAC breakfast  . pantoprazole  40 mg Oral Daily  . potassium chloride SA  20 mEq Oral Daily  . pravastatin  40 mg Oral QPM  . sodium chloride flush  3 mL Intravenous Q12H  . Warfarin - Pharmacist Dosing Inpatient   Does not apply q1800   Continuous Infusions: PRN Meds:.    Anti-infectives (From  admission, onward)   Start     Dose/Rate Route Frequency Ordered Stop   07/04/17 1215  cefUROXime (CEFTIN) tablet 500 mg     500 mg Oral 2 times daily with meals 07/04/17 1202 07/08/17 0859        Objective:   Vitals:   07/05/17 1940 07/05/17 2332 07/06/17 0612 07/06/17 0833  BP: (!) 102/57  (!) 109/56 109/65  Pulse: (!) 59 61 (!) 59 60  Resp: 18 18 18    Temp: 98.2 F (36.8 C)  97.6 F (36.4 C)   TempSrc: Oral  Oral   SpO2: 96% 98% 98% 96%  Weight:   73.8 kg (162 lb 11.2 oz)   Height:        Wt Readings from Last 3 Encounters:  07/06/17 73.8 kg (162 lb 11.2 oz)  06/04/17 80.7 kg (178 lb)  04/20/17 80 kg (  176 lb 4.8 oz)     Intake/Output Summary (Last 24 hours) at 07/06/2017 0932 Last data filed at 07/06/2017 5093 Gross per 24 hour  Intake 840 ml  Output 1550 ml  Net -710 ml     Physical Exam  Gen:- Awake Alert,  in no apparent distress  HEENT:- [place].AT, No sclera icterus Neck-Supple Neck,No JVD,.  Lungs-diminished in bases, no wheezing, right subclavian area with pacemaker in situ CV- S1, S2 normal, loud holosystolic murmur, CABG scar Abd-  +ve B.Sounds, Abd Soft, No tenderness,    Extremity/Skin:- Rt knee swelling, warmth, tenderness and range of motion has improved according to patient and wife,  Gu-Foley with clear urine Neuro-no new focal deficits but patient complains of generalized weakness   Data Review:   Micro Results No results found for this or any previous visit (from the past 240 hour(s)).  Radiology Reports Portable Chest 1 View  Result Date: 07/04/2017 CLINICAL DATA:  83 year old male with hypoxia. EXAM: PORTABLE CHEST 1 VIEW COMPARISON:  Chest radiograph dated 08/02/2015 FINDINGS: Minimal left lung base atelectatic changes. There is no focal consolidation, pleural effusion or pneumothorax. The left costophrenic angle has been excluded from the image. There is stable cardiomegaly. Median sternotomy wires and mechanical cardiac valve.  Atherosclerotic calcification of the aortic arch. No acute osseous pathology. IMPRESSION: 1. No acute cardiopulmonary process. 2. Stable cardiomegaly. Electronically Signed   By: Anner Crete M.D.   On: 07/04/2017 00:03     CBC Recent Labs  Lab 07/03/17 2300 07/04/17 0505 07/06/17 0547  WBC 10.0 9.2 8.9  HGB 8.4* 8.3* 8.5*  HCT 27.3* 26.6* 28.0*  PLT 195 202 206  MCV 85.6 86.1 87.5  MCH 26.3 26.9 26.6  MCHC 30.8 31.2 30.4  RDW 17.8* 17.9* 18.2*  LYMPHSABS 1.2  --   --   MONOABS 0.5  --   --   EOSABS 0.0  --   --   BASOSABS 0.0  --   --     Chemistries  Recent Labs  Lab 07/03/17 2300 07/04/17 0505 07/06/17 0547  NA 136 138 140  K 3.1* 3.2* 3.7  CL 102 103 104  CO2 26 27 29   GLUCOSE 174* 108* 109*  BUN 14 14 33*  CREATININE 1.10 1.01 1.20  CALCIUM 8.5* 8.8* 8.9  MG 2.2  --   --    ------------------------------------------------------------------------------------------------------------------ No results for input(s): CHOL, HDL, LDLCALC, TRIG, CHOLHDL, LDLDIRECT in the last 72 hours.  No results found for: HGBA1C ------------------------------------------------------------------------------------------------------------------ Recent Labs    07/04/17 1237  TSH 2.303   ------------------------------------------------------------------------------------------------------------------ No results for input(s): VITAMINB12, FOLATE, FERRITIN, TIBC, IRON, RETICCTPCT in the last 72 hours.  Coagulation profile Recent Labs  Lab 07/03/17 2300 07/04/17 0505 07/05/17 0444 07/06/17 0547  INR 3.01 3.01 3.61 4.41*    No results for input(s): DDIMER in the last 72 hours.  Cardiac Enzymes No results for input(s): CKMB, TROPONINI, MYOGLOBIN in the last 168 hours.  Invalid input(s): CK ------------------------------------------------------------------------------------------------------------------    Component Value Date/Time   BNP 695.9 (H) 07/03/2017 2300      Roxan Hockey M.D on 07/06/2017 at 9:32 AM  Between 7am to 7pm - Pager - [phone]  After 7pm go to [email or web address] - password [email or web address]  [facility] -  Office  [phone]   Voice Recognition Viviann Spare dictation system was used to create this note, attempts have been made to correct errors. Please contact the author with questions and/or clarifications.
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
## 2017-07-06 NOTE — Discharge Instructions (Signed)
Heart Healthy diet but need to eat- try Ensure between meals for more protein  Call the office for any bleeding.  Hold your coumadin for 07/06/17 and 07/07/17 resume on Thursday the 13th and have lab work checked on the 14th.  Very important.   Use CPAP at night  Foley catheter care per home health -   Information on my medicine - Coumadin   (Warfarin)  This medication education was reviewed with me or my healthcare representative as part of my discharge preparation.    Why was Coumadin prescribed for you? Coumadin was prescribed for you because you have a blood clot or a medical condition that can cause an increased risk of forming blood clots. Blood clots can cause serious health problems by blocking the flow of blood to the heart, lung, or brain. Coumadin can prevent harmful blood clots from forming. As a reminder your indication for Coumadin is:   Stroke Prevention Because Of Atrial Fibrillation  What test will check on my response to Coumadin? While on Coumadin (warfarin) you will need to have an INR test regularly to ensure that your dose is keeping you in the desired range. The INR (international normalized ratio) number is calculated from the result of the laboratory test called prothrombin time (PT).  If an INR APPOINTMENT HAS NOT ALREADY BEEN MADE FOR YOU please schedule an appointment to have this lab work done by your health care provider within 7 days. Your INR goal is usually a number between:  2 to 3 or your provider may give you a more narrow range like 2-2.5.  Ask your health care provider during an office visit what your goal INR is.  What  do you need to  know  About  COUMADIN? Take Coumadin (warfarin) exactly as prescribed by your healthcare provider about the same time each day.  DO NOT stop taking without talking to the doctor who prescribed the medication.  Stopping without other blood clot prevention medication to take the place of Coumadin may increase your risk of  developing a new clot or stroke.  Get refills before you run out.  What do you do if you miss a dose? If you miss a dose, take it as soon as you remember on the same day then continue your regularly scheduled regimen the next day.  Do not take two doses of Coumadin at the same time.  Important Safety Information A possible side effect of Coumadin (Warfarin) is an increased risk of bleeding. You should call your healthcare provider right away if you experience any of the following: ? Bleeding from an injury or your nose that does not stop. ? Unusual colored urine (red or dark brown) or unusual colored stools (red or black). ? Unusual bruising for unknown reasons. ? A serious fall or if you hit your head (even if there is no bleeding).  Some foods or medicines interact with Coumadin (warfarin) and might alter your response to warfarin. To help avoid this: ? Eat a balanced diet, maintaining a consistent amount of Vitamin K. ? Notify your provider about major diet changes you plan to make. ? Avoid alcohol or limit your intake to 1 drink for women and 2 drinks for men per day. (1 drink is 5 oz. wine, 12 oz. beer, or 1.5 oz. liquor.)  Make sure that ANY health care provider who prescribes medication for you knows that you are taking Coumadin (warfarin).  Also make sure the healthcare provider who is monitoring your Coumadin  knows when you have started a new medication including herbals and non-prescription products.  Coumadin (Warfarin)  Major Drug Interactions  Increased Warfarin Effect Decreased Warfarin Effect  Alcohol (large quantities) Antibiotics (esp. Septra/Bactrim, Flagyl, Cipro) Amiodarone (Cordarone) Aspirin (ASA) Cimetidine (Tagamet) Megestrol (Megace) NSAIDs (ibuprofen, naproxen, etc.) Piroxicam (Feldene) Propafenone (Rythmol SR) Propranolol (Inderal) Isoniazid (INH) Posaconazole (Noxafil) Barbiturates (Phenobarbital) Carbamazepine (Tegretol) Chlordiazepoxide  (Librium) Cholestyramine (Questran) Griseofulvin Oral Contraceptives Rifampin Sucralfate (Carafate) Vitamin K   Coumadin (Warfarin) Major Herbal Interactions  Increased Warfarin Effect Decreased Warfarin Effect  Garlic Ginseng Ginkgo biloba Coenzyme Q10 Green tea St. Johns wort    Coumadin (Warfarin) FOOD Interactions  Eat a consistent number of servings per week of foods HIGH in Vitamin K (1 serving =  cup)  Collards (cooked, or boiled & drained) Kale (cooked, or boiled & drained) Mustard greens (cooked, or boiled & drained) Parsley *serving size only =  cup Spinach (cooked, or boiled & drained) Swiss chard (cooked, or boiled & drained) Turnip greens (cooked, or boiled & drained)  Eat a consistent number of servings per week of foods MEDIUM-HIGH in Vitamin K (1 serving = 1 cup)  Asparagus (cooked, or boiled & drained) Broccoli (cooked, boiled & drained, or raw & chopped) Brussel sprouts (cooked, or boiled & drained) *serving size only =  cup Lettuce, raw (green leaf, endive, romaine) Spinach, raw Turnip greens, raw & chopped   These websites have more information on Coumadin (warfarin):  FailFactory.se; VeganReport.com.au;

## 2017-07-06 NOTE — Progress Notes (Signed)
Progress Note  Patient Name: [name] Date of Encounter: 07/06/2017  Primary Cardiologist: Domenic Polite and [place] to bear weight on right knee no dyspnea still with foley in place  Inpatient Medications    Scheduled Meds: . amiodarone  100 mg Oral Daily  . amLODipine  5 mg Oral Daily  . benazepril  40 mg Oral Daily  . carvedilol  3.125 mg Oral BID WC  . cefUROXime  500 mg Oral BID WC  . docusate sodium  200 mg Oral Daily  . furosemide  40 mg Oral Daily  . furosemide  80 mg Oral Daily  . levothyroxine  50 mcg Oral QAC breakfast  . pantoprazole  40 mg Oral Daily  . potassium chloride SA  20 mEq Oral Daily  . pravastatin  40 mg Oral QPM  . sodium chloride flush  3 mL Intravenous Q12H  . Warfarin - Pharmacist Dosing Inpatient   Does not apply q1800   Continuous Infusions:  PRN Meds:    Vital Signs    Vitals:   07/05/17 1940 07/05/17 2332 07/06/17 0612 07/06/17 0833  BP: (!) 102/57  (!) 109/56 109/65  Pulse: (!) 59 61 (!) 59 60  Resp: 18 18 18    Temp: 98.2 F (36.8 C)  97.6 F (36.4 C)   TempSrc: Oral  Oral   SpO2: 96% 98% 98% 96%  Weight:   162 lb 11.2 oz (73.8 kg)   Height:        Intake/Output Summary (Last 24 hours) at 07/06/2017 0904 Last data filed at 07/06/2017 0550 Gross per 24 hour  Intake 480 ml  Output 1550 ml  Net -1070 ml   Filed Weights   07/04/17 0459 07/05/17 0410 07/06/17 0612  Weight: 167 lb 12.3 oz (76.1 kg) 165 lb 5.5 oz (75 kg) 162 lb 11.2 oz (73.8 kg)    Telemetry    Paced  - Personally Reviewed  ECG      Physical Exam   Affect appropriate Healthy:  appears stated age HEENT: normal Neck supple with no adenopathy JVP normal no bruits no thyromegaly Lungs clear with no wheezing and good diaphragmatic motion Heart:  S1/S2 no murmur, no rub, gallop or click PMI normal Abdomen: benighn, BS positve, no tenderness, no AAA no bruit.  No HSM or HJR Distal pulses intact with no bruits No edema Neuro  non-focal Skin warm and dry Post left TKR Right knee with less effusion post drainage still with decreased mobility Foley catheter in place    Labs    Chemistry Recent Labs  Lab 07/03/17 2300 07/04/17 0505 07/06/17 0547  NA 136 138 140  K 3.1* 3.2* 3.7  CL 102 103 104  CO2 26 27 29   GLUCOSE 174* 108* 109*  BUN 14 14 33*  CREATININE 1.10 1.01 1.20  CALCIUM 8.5* 8.8* 8.9  GFRNONAA 59* >60 53*  GFRAA >60 >60 >60  ANIONGAP 8 8 7      Hematology Recent Labs  Lab 07/03/17 2300 07/04/17 0505 07/06/17 0547  WBC 10.0 9.2 8.9  RBC 3.19* 3.09* 3.20*  HGB 8.4* 8.3* 8.5*  HCT 27.3* 26.6* 28.0*  MCV 85.6 86.1 87.5  MCH 26.3 26.9 26.6  MCHC 30.8 31.2 30.4  RDW 17.8* 17.9* 18.2*  PLT 195 202 206    Cardiac EnzymesNo results for input(s): TROPONINI in the last 168 hours. No results for input(s): TROPIPOC in the last 168 hours.   BNP Recent Labs  Lab 07/03/17  2300  BNP 695.9*     DDimer No results for input(s): DDIMER in the last 168 hours.   Radiology    No results found.  Cardiac Studies  Study Conclusions  - Left ventricle: The cavity size was normal. Wall thickness was   increased in a pattern of mild LVH. Systolic function was normal.   The estimated ejection fraction was in the range of 55% to 60%.   Cannot evaluate diastolic function is setting of MV repair/anular   ring. - Aortic valve: There is a bioprosthetic AV of unknown type and   size. There is a severe gradient across the AV bioprosthetic   valve. Mean gradient (S): 42 mm Hg. Peak gradient (S): 77 mm Hg. - Mitral valve: There was mild regurgitation. There is a moderate   gradient across the repaired MV. Mean gradient 6 mmHg. - Left atrium: The atrium was severely dilated. - Right ventricle: The cavity size was mildly dilated. - Right atrium: The atrium was severely dilated. - Tricuspid valve: There was moderate regurgitation. - Pulmonary arteries: Systolic pressure was moderately increased.    PA peak pressure: 63 mm Hg (S).  Impressions:  - Increased gradients across the bioprosthetic aortic valve   compared to 07/02/16 study. The valve is not visualized well,   cannot determine the associated change in morphology/function.   Consider TEE.   Patient Profile     83 y.o. male   Assessment & Plan    1  Dysynea  Etiology not clear CXR normal lungs clear BNP minimally elevated  AVR Is 21 mm CE with mitral repair despite higher gradient on TTE leaflets normal on TEE Reviewed with Dr Sallyanne Kuster   2  Hx CAD  S/p CABG x 2 (SVG to D1, SVG to OM2)  No active angina   3.  R knee  Bakers cyst on USN in [place]  Post drainage by ortho  Bloody feels much better but Needs PT/OT and ortho f/u   4.  Urol  Foley to stay in for 2 weeks will make f/u appt with [facility]   5   AVR:  See TEE report from yesterday   [place]   S/p MV repair trivial residual MR   7  Hx Afib /flutter S/p MAZE and LAA stapling  No evid afib  On tele  Continue coumadin     8SSS  S/p PPM Medtronic   Had previous device removed   Ambulate if wife feels comfortable going home will d/c if not transfer to medicine  Service and consider rehab stay    Signed, Jenkins Rouge, MD  07/06/2017, 9:04 AM

## 2017-07-06 NOTE — Evaluation (Signed)
Physical Therapy Evaluation Patient Details Name: [name] MRN: [id number] DOB: [birth year] Today's Date: 07/06/2017   History of Present Illness  Pt is an 83 y/o male transferred from [facility] hospital secondary to CHF. Pt also with R knee pain and had fluid removed off of knee. Underwent TEE on 12/10. PMH includes CAD s/p CABG, dCHF, OSA, osteoporosis, s/p AVR, s/p MV repair, and a fib s/p MAZE, and HTN.   Clinical Impression  Pt admitted secondary to problem above with deficits below. PTA, pt was using rollator for short distance ambulation, transport chair for longer distances, and was independent with ADLs. Upon eval, pt presenting with L rib pain, R knee pain (reports improvement since admission), decreased balance, and weakness. Pt with LLE length discrepancy at baseline and walks on toes on LLE when not using lifted shoe. Min guard for mobility with use of RW. Reports improvement of rib pain with use of log roll technique with bed mobility and during ambulation as well. Recommending HHPT at d/c to address deficits with mobility. Will have appropriate assist level at home. Will continue to follow acutely to maximize independence and safety with functional mobility.     Follow Up Recommendations Home health PT;Supervision/Assistance - 24 hour    Equipment Recommendations  None recommended by PT    Recommendations for Other Services       Precautions / Restrictions Precautions Precautions: Fall Required Braces or Orthoses: Other Brace/Splint Other Brace/Splint: Pt uses lifted shoe on LLE.  Restrictions Weight Bearing Restrictions: No      Mobility  Bed Mobility Overal bed mobility: Needs Assistance Bed Mobility: Rolling;Sidelying to Sit;Sit to Sidelying Rolling: Supervision Sidelying to sit: Min guard     Sit to sidelying: Supervision General bed mobility comments: Min guard for safety during trunk elevation. Educated to use log roll technique to decrease stress on  abdomen as pt was having pain at ribs. Pt's wife reports he uses lift chair at home.   Transfers Overall transfer level: Needs assistance Equipment used: Rolling walker (2 wheeled) Transfers: Sit to/from Stand Sit to Stand: Min guard         General transfer comment: Min guard for safety. Required extended time to complete.   Ambulation/Gait Ambulation/Gait assistance: Min guard Ambulation Distance (Feet): 30 Feet Assistive device: Rolling walker (2 wheeled) Gait Pattern/deviations: Step-to pattern;Decreased dorsiflexion - left;Decreased stride length Gait velocity: Decreased Gait velocity interpretation: Below normal speed for age/gender General Gait Details: Slow, overall steady gait with use of RW. Pt walking on L toes as pt with leg length discrepancy. Educated to use lifted shoe at home to increase safety with mobility. Min guard for safety.   Stairs            Wheelchair Mobility    Modified Rankin (Stroke Patients Only)       Balance Overall balance assessment: Needs assistance Sitting-balance support: No upper extremity supported;Feet supported Sitting balance-Leahy Scale: Good     Standing balance support: Bilateral upper extremity supported Standing balance-Leahy Scale: Poor Standing balance comment: Reliant on UE support                              Pertinent Vitals/Pain Pain Assessment: 0-10 Pain Score: 5  Pain Location: Rib pain  Pain Descriptors / Indicators: Aching Pain Intervention(s): Limited activity within patient's tolerance;Monitored during session;Repositioned    Home Living Family/patient expects to be discharged to:: Private residence Living Arrangements: Spouse/significant other Available Help  at Discharge: Family;Available 24 hours/day Type of Home: House Home Access: Level entry     Home Layout: One level Home Equipment: Grab bars - toilet;Grab bars - tub/shower;Walker - 4 wheels;Walker - 2 wheels;Transport chair       Prior Function Level of Independence: Independent with assistive device(s)         Comments: Use rollator for ambulation. Uses transport chair for outdoor      Hand Dominance   Dominant Hand: Right    Extremity/Trunk Assessment   Upper Extremity Assessment Upper Extremity Assessment: LUE deficits/detail LUE Deficits / Details: L arm numbness    Lower Extremity Assessment Lower Extremity Assessment: LLE deficits/detail;Generalized weakness LLE Deficits / Details: LLE leg length discrepancy at baseline secondary to accident when he was younger. Walks on tip toes without lift shoe. Unable to flex knee to stand.     Cervical / Trunk Assessment Cervical / Trunk Assessment: Kyphotic  Communication   Communication: No difficulties  Cognition Arousal/Alertness: Awake/alert Behavior During Therapy: WFL for tasks assessed/performed Overall Cognitive Status: Within Functional Limits for tasks assessed                                        General Comments General comments (skin integrity, edema, etc.): Pt's wife present during session. Very anxious about mobility and going home, however, decreased anxiety when she saw pt ambulating. Educated about safety at home and HHPT recommendations and pt and wife agreeable. Concerned about rib pain, however, pt with decreased rib pain when using log roll technique and had no pain during mobility. Educated about RLE exercises to perform to help decrease R knee pain.     Exercises     Assessment/Plan    PT Assessment Patient needs continued PT services  PT Problem List Decreased strength;Decreased balance;Decreased mobility;Decreased knowledge of use of DME;Pain       PT Treatment Interventions DME instruction;Gait training;Functional mobility training;Therapeutic activities;Therapeutic exercise;Balance training;Neuromuscular re-education;Patient/family education    PT Goals (Current goals can be found in the Care Plan  section)  Acute Rehab PT Goals Patient Stated Goal: to go home  PT Goal Formulation: With patient Time For Goal Achievement: 07/20/17 Potential to Achieve Goals: Good    Frequency Min 3X/week   Barriers to discharge        Co-evaluation               AM-PAC PT "6 Clicks" Daily Activity  Outcome Measure Difficulty turning over in bed (including adjusting bedclothes, sheets and blankets)?: A Little Difficulty moving from lying on back to sitting on the side of the bed? : Unable Difficulty sitting down on and standing up from a chair with arms (e.g., wheelchair, bedside commode, etc,.)?: Unable Help needed moving to and from a bed to chair (including a wheelchair)?: A Little Help needed walking in hospital room?: A Little Help needed climbing 3-5 steps with a railing? : A Lot 6 Click Score: 13    End of Session Equipment Utilized During Treatment: Gait belt Activity Tolerance: Patient tolerated treatment well Patient left: in bed;with call bell/phone within reach;with family/visitor present Nurse Communication: Mobility status PT Visit Diagnosis: Other abnormalities of gait and mobility (R26.89);Muscle weakness (generalized) (M62.81);Pain Pain - Right/Left: Right Pain - part of body: Knee(ribs on L )    Time: 5102-5852 PT Time Calculation (min) (ACUTE ONLY): 26 min   Charges:   PT Evaluation $PT  Eval Low Complexity: 1 Low PT Treatments $Gait Training: 8-22 mins   PT G Codes:        Leighton Ruff, PT, DPT  Acute Rehabilitation Services  Pager: [phone]   Rudean Hitt 07/06/2017, 12:39 PM

## 2017-07-06 NOTE — Progress Notes (Addendum)
CRITICAL VALUE ALERT  Critical Value:  INR 4.41  Date & Time Notied:  07/06/17 0726  Provider Notified: Cardiology NP Mancel Bale and NP Dorene Ar  Orders Received/Actions taken: Discontinued Coumadin

## 2017-07-07 ENCOUNTER — Encounter (HOSPITAL_COMMUNITY): Payer: Self-pay | Admitting: Cardiovascular Disease

## 2017-07-07 ENCOUNTER — Other Ambulatory Visit: Payer: Self-pay | Admitting: *Deleted

## 2017-07-07 MED ORDER — CARVEDILOL 3.125 MG PO TABS: ORAL_TABLET | ORAL | 3 refills | Status: DC

## 2017-07-08 ENCOUNTER — Other Ambulatory Visit: Payer: Self-pay

## 2017-07-08 MED ORDER — AMIODARONE HCL 200 MG PO TABS: 100.0000 mg | ORAL_TABLET | Freq: Every day | ORAL | 3 refills | Status: DC

## 2017-07-09 ENCOUNTER — Ambulatory Visit (INDEPENDENT_AMBULATORY_CARE_PROVIDER_SITE_OTHER): Payer: Medicare Other | Admitting: Cardiology

## 2017-07-09 ENCOUNTER — Encounter: Payer: Self-pay | Admitting: Cardiology

## 2017-07-09 VITALS — BP 100/58 | HR 62 | Ht 65.0 in | Wt 168.0 lb

## 2017-07-09 DIAGNOSIS — Z950 Presence of cardiac pacemaker: Secondary | ICD-10-CM

## 2017-07-09 DIAGNOSIS — I5032 Chronic diastolic (congestive) heart failure: Secondary | ICD-10-CM

## 2017-07-09 DIAGNOSIS — I480 Paroxysmal atrial fibrillation: Secondary | ICD-10-CM

## 2017-07-09 DIAGNOSIS — I38 Endocarditis, valve unspecified: Secondary | ICD-10-CM | POA: Diagnosis not present

## 2017-07-09 DIAGNOSIS — I495 Sick sinus syndrome: Secondary | ICD-10-CM | POA: Diagnosis not present

## 2017-07-09 NOTE — Progress Notes (Signed)
Cardiology Office Note  Date: 07/09/2017   ID: [name], [name] 1928/12/28, MRN [id number]  PCP: Eber Hong, MD  Primary Cardiologist: Rozann Lesches, MD   Chief Complaint  Patient presents with  . Hospitalization Follow-up    History of Present Illness: [name] is a medically complex 83 y.o. male last seen in July.  I reviewed extensive interval records.  He was just recently hospitalized at [facility] (discharged December 11).  He had been evaluated in [place] with right knee pain and swelling, found to be hypoxic on room air and also with a temperature up to 103 degrees.  Lower extremity venous Dopplers were negative for DVT.  He did have a Baker's cyst on the right.  Thromboembolic disease otherwise not suspected as patient is on Coumadin.  Source for fever was not determined, he was treated empirically with antibiotics, ultimately placed on oral Ceftin.  Blood cultures were negative.  He underwent TEE that was negative for vegetation with increased transvalvular aortic prosthetic gradient likely related to patient/prosthesis mismatch.  He was seen by orthopedics and had right knee joint effusion tapped with symptomatic improvement.  Also treated with IV Lasix.  He has an indwelling Foley catheter with leg bag since discharge and reportedly urology follow-up with recommendations for potential surgery (details not clear as yet). He presents with his wife today for a follow-up visit.  He is in a wheelchair today.  With Foley catheter in place his urine output has been brisk, he has been taking Lasix at 80 mg in the morning and 40 mm.  Weight is coming down.  He reports good control of leg edema.  Also no chest pain or palpitations.  I reviewed his recent ECG.  We went over his cardiac medications which are unchanged.  I do not know the type and extent of planned surgery for urological issues, and can comment more on perioperative surgical risk once more information is available.  In  general however, based on his substrate alone he will likely be in the intermediate to high risk range.  He continues on Coumadin, followed by PCP.  No reported bleeding problems.  Past Medical History:  Diagnosis Date  . Anemia   . Arthritis   . Atrial flutter (HCC)    Amiodarone therapy, coumadin therapy, prior history of cardioversion and overdrive pacing  . Barrett's esophagus   . Benign prostatic hyperplasia   . Cardiomyopathy    Mixed ischemic and nonischemic with previous EF of 35-40%, most recently in June 2009 normalized to 55-60%. Echo 2/11 EF 55-60%  . Carotid artery disease (HCC)    Less than 50% bilateral internal carotid artery stenosis by Doppler 2009  . Carpal tunnel syndrome   . Chronic diastolic (congestive) heart failure ([place])   . Coronary atherosclerosis of native coronary artery    Status post coronary bypass grafting  . 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 Endocarditis   . Essential hypertension, benign   . Hiatal hernia   . Hyperlipidemia   . Hypothyroidism   . Internal hemorrhoids   . Iron deficiency anemia   . Obstructive sleep apnea   . Osteoporosis   . Paroxysmal atrial fibrillation (HCC)    Amiodarone therapy, status post Cox-Maze procedure but with recurrent atrial fibrillation while on amiodarone August 2012  . Symptomatic bradycardia   . Tubular adenoma of colon   . Valvular heart disease  Status post by prosthetic aortic valve and mitral valve repair  . Warfarin anticoagulation     Past Surgical History:  Procedure Laterality Date  . AORTIC VALVE REPLACEMENT    . CARPAL TUNNEL RELEASE  2009  . CATARACT EXTRACTION, BILATERAL  07/2011  . CORONARY ARTERY BYPASS GRAFT  2008   x2  . HERNIA REPAIR  age 55  . KNEE ARTHROSCOPY    . PACEMAKER INSERTION  2009   Initially implanted at [facility], extracted by Dr Lovena Le for endocarditis and reimplanted 8/12  . PATELLA FRACTURE SURGERY  age 28     screw placed and removed  . TEE WITHOUT CARDIOVERSION N/A 07/05/2017   Procedure: TRANSESOPHAGEAL ECHOCARDIOGRAM (TEE);  Surgeon: Sanda Klein, MD;  Location: [place];  Service: Cardiovascular;  Laterality: N/A;  . TONSILLECTOMY  1939  . TOTAL HIP ARTHROPLASTY      Current Outpatient Medications  Medication Sig Dispense Refill  . ALOE VERA JUICE PO Take 2 oz by mouth daily.     Marland Kitchen amiodarone (PACERONE) 200 MG tablet Take 0.5 tablets (100 mg total) by mouth daily. 45 tablet 3  . amLODipine (NORVASC) 5 MG tablet Take 5 mg by mouth daily.  3  . benazepril (LOTENSIN) 40 MG tablet TAKE 1 TABLET BY MOUTH DAILY 30 tablet 6  . carvedilol (COREG) 3.125 MG tablet TAKE 1 TABLET (3.125 MG TOTAL) BY MOUTH 2 (TWO) TIMES DAILY. 180 tablet 3  . docusate sodium (COLACE) 100 MG capsule Take 200 mg by mouth daily.     Marland Kitchen FIBER PO Take 1 packet by mouth daily. ACACIA FIBER-[employer] Brand fiber therapy    . furosemide (LASIX) 40 MG tablet TAKE 2 TABLETS BY MOUTH IN THE MORNING AND TAKE 1 TAB EVERY EVEVING,MAY TAKE 1 EXTRA TAB IN AM. (Patient taking differently: 80MG  BY MOUTH IN AM (MAY TAKE EXTRA 40MG ), 40MG  IN PM) 90 tablet 2  . KLOR-CON M20 20 MEQ tablet TAKE 1 TABLET BY MOUTH DAILY 30 tablet 6  . levothyroxine (SYNTHROID, LEVOTHROID) 50 MCG tablet Take 50 mcg by mouth daily before breakfast.    . Multiple Vitamins-Minerals (THEREMS M PO) Take 1 tablet by mouth daily.      . nitroGLYCERIN (NITROSTAT) 0.4 MG SL tablet Place 1 tablet (0.4 mg total) under the tongue every 5 (five) minutes as needed. Up to 3 doses, if no relief after 3rd dose, proceed to the ED for an evaluation. 25 tablet 3  . pantoprazole (PROTONIX) 40 MG tablet TAKE 1 TABLET BY MOUTH DAILY. 90 tablet 3  . pravastatin (PRAVACHOL) 40 MG tablet TAKE 1 TABLET BY MOUTH EVERY EVENING 90 tablet 2  . Probiotic Product (PROBIOTIC DAILY PO) Take 1 tablet by mouth daily.    Marland Kitchen warfarin (COUMADIN) 2 MG tablet Take 1-2 mg by mouth See admin  instructions. 1mg  by mouth once daily on Mon,Fri - 2mg  once daily all other days     No current facility-administered medications for this visit.    Allergies:  Patient has no known allergies.   Social History: The patient  reports that he quit smoking about 62 years ago. His smoking use included cigarettes. He started smoking about 73 years ago. He has a 2.40 pack-year smoking history. he has never used smokeless tobacco. He reports that he drinks alcohol. He reports that he does not use drugs.   ROS:  Please see the history of present illness. Otherwise, complete review of systems is positive for chronic fatigue, urinary incontinence.  All other systems  are reviewed and negative.   Physical Exam: VS:  BP (!) 100/58   Pulse 62   Ht 5\' 5"  (1.651 m)   Wt 168 lb (76.2 kg)   SpO2 96%   BMI 27.96 kg/m , BMI Body mass index is 27.96 kg/m.  Wt Readings from Last 3 Encounters:  07/09/17 168 lb (76.2 kg)  07/06/17 162 lb 11.2 oz (73.8 kg)  06/04/17 178 lb (80.7 kg)    General: Chronically ill-appearing elderly male seated in wheelchair. HEENT: Conjunctiva and lids normal, oropharynx clear. Neck: Supple, no elevated JVP or carotid bruits, no thyromegaly. Lungs: Clear to auscultation, nonlabored breathing at rest. Cardiac: Regular rate and rhythm, no S3, 1-7/6 systolic murmur, no pericardial rub. Abdomen: Soft, nontender, bowel sounds present, no guarding or rebound. Extremities: Mild lower leg edema, distal pulses 2+. Skin: Warm and dry. Musculoskeletal: No kyphosis. Neuropsychiatric: Alert and oriented x3, affect grossly appropriate.  ECG: I personally reviewed the tracing from 07/03/2017 which showed dual-chamber pacing.  Recent Labwork: 04/20/2017: ALT 13; AST 19 07/03/2017: B Natriuretic Peptide 695.9; Magnesium 2.2 07/04/2017: TSH 2.303 07/06/2017: BUN 33; Creatinine, Ser 1.20; Hemoglobin 8.5; Platelets 206; Potassium 3.7; Sodium 140   Other Studies Reviewed Today:  TEE  07/05/2017: Study Conclusions  - Left ventricle: There was mild concentric hypertrophy. Systolic   function was normal. The estimated ejection fraction was in the   range of 55% to 60%. Wall motion was normal; there were no   regional wall motion abnormalities. - Aortic valve: A 21.0 cmCarpentier-Edwards pericardial   bioprosthesis was present. The prosthesis had a normal range of   motion. The sewing ring appeared normal. No evidence of   vegetation. Valve area (VTI): 0.77 cm^2. Valve area (Vmax): 0.77   cm^2. Valve area (Vmean): 0.76 cm^2. - Mitral valve: Prior procedures included surgical repair. An   annular ring prosthesis was present and functioning normally. No   evidence of vegetation. There was mild to moderate regurgitation   directed eccentrically and toward the septum. Mean gradient (D):   4 mm Hg at a heart rate of 70 bpm. Valve area by pressure   half-time: 2 cm^2. Valve area by continuity equation (using LVOT   flow): 1.29 cm^2. - Left atrium: The atrium was severely dilated. No evidence of   thrombus in the atrial cavity or appendage. There was   mildcontinuous spontaneous echo contrast (&quot;smoke&quot;) in the cavity   and the appendage. There is only a remnant of the appendage,   which appears to be surgically resected or clipped. - Right ventricle: The cavity size was mildly dilated. - Right atrium: The atrium was severely dilated. - Tricuspid valve: No evidence of vegetation. There was moderate   regurgitation. - Pulmonic valve: No evidence of vegetation.  Impressions:  - The very high transvalvular gradients through the aortic valve   prosthesis, reported on the transthoracic study, are not   confirmed on the current study, but the gradients are   nevertheless abnormally high.  Recommendations:  No evidence of vegetation or perivalvular abscess. The aortic valve gradients are lower than on the recent transthoracic study and may be underestimated due  to poor Doppler beam alignment. Even so, there is a big discrepancy between the normal leaflet motion and the elevated gradients. This is likely explained by a combination of patient valve mismatch (dimensionless index 0.23) and high cardiac output (e.g. anemia, infection, etc.).  Assessment and Plan:  1.  Chronic diastolic heart failure.  Weight is down from November  and he is reporting compliance with Lasix at 80 mg in the morning and 40 mg in the afternoon, Foley catheter in place with good urine output.  LVEF 55-60% by recent echocardiogram.  2.  Valvular heart disease with bioprosthetic AVR and previous mitral valve repair.  Recent TEE did not demonstrate evidence of vegetations in the setting of fever.  Blood cultures were negative.  He does have a high transvalvular aortic gradient which is likely related to patient/prosthesis mismatch with adequate leaflet motion.  3.  Paroxysmal atrial fibrillation.  He continues on Coumadin for stroke prophylaxis and amiodarone with good rhythm control overall.  Recent TSH and LFTs normal.  4.  Sick sinus syndrome status post Medtronic pacemaker.  He continues to follow with Dr. Rayann Heman in the device clinic.  5.  Urinary incontinence with other reported active urological issues, Foley catheter in place with leg bag.  He is being referred for surgical consultation, I do not have other details.  Can comment more on perioperative cardiac surgical risk as more information comes available, however based on his substrate, he will likely be in the intermediate to high risk.  Current medicines were reviewed with the patient today.  Disposition: Follow-up in 1 month.  Signed, Satira Sark, MD, [facility] 07/09/2017 3:43 PM    [facility] [place], [place], [place] [address] Phone: [phone]; Fax: [phone]

## 2017-07-09 NOTE — Patient Instructions (Signed)
Medication Instructions:  Your physician recommends that you continue on your current medications as directed. Please refer to the Current Medication list given to you today.  Labwork: NONE  Testing/Procedures: NONE  Follow-Up: Your physician recommends that you schedule a follow-up appointment in: 1 MONTH WITH DR. MCDOWELL  Any Other Special Instructions Will Be Listed Below (If Applicable).  If you need a refill on your cardiac medications before your next appointment, please call your pharmacy. 

## 2017-07-21 ENCOUNTER — Other Ambulatory Visit: Payer: Self-pay | Admitting: *Deleted

## 2017-07-21 MED ORDER — BENAZEPRIL HCL 40 MG PO TABS: 40.0000 mg | ORAL_TABLET | Freq: Every day | ORAL | 3 refills | Status: DC

## 2017-07-22 ENCOUNTER — Ambulatory Visit: Payer: Medicare Other | Admitting: Hematology

## 2017-07-22 ENCOUNTER — Other Ambulatory Visit: Payer: Medicare Other

## 2017-07-29 ENCOUNTER — Ambulatory Visit: Payer: Medicare Other | Admitting: Cardiology

## 2017-08-03 ENCOUNTER — Other Ambulatory Visit: Payer: Self-pay | Admitting: *Deleted

## 2017-08-03 ENCOUNTER — Encounter: Payer: Self-pay | Admitting: Hematology

## 2017-08-03 ENCOUNTER — Telehealth: Payer: Self-pay | Admitting: Hematology

## 2017-08-03 ENCOUNTER — Inpatient Hospital Stay (HOSPITAL_BASED_OUTPATIENT_CLINIC_OR_DEPARTMENT_OTHER): Payer: Medicare Other | Admitting: Hematology

## 2017-08-03 ENCOUNTER — Inpatient Hospital Stay: Payer: Medicare Other | Attending: Hematology

## 2017-08-03 VITALS — BP 116/64 | HR 63 | Temp 98.4°F | Resp 18 | Ht 65.0 in | Wt 172.6 lb

## 2017-08-03 DIAGNOSIS — Z7901 Long term (current) use of anticoagulants: Secondary | ICD-10-CM | POA: Diagnosis not present

## 2017-08-03 DIAGNOSIS — E039 Hypothyroidism, unspecified: Secondary | ICD-10-CM

## 2017-08-03 DIAGNOSIS — D500 Iron deficiency anemia secondary to blood loss (chronic): Secondary | ICD-10-CM | POA: Insufficient documentation

## 2017-08-03 DIAGNOSIS — Z800 Family history of malignant neoplasm of digestive organs: Secondary | ICD-10-CM | POA: Insufficient documentation

## 2017-08-03 DIAGNOSIS — I480 Paroxysmal atrial fibrillation: Secondary | ICD-10-CM

## 2017-08-03 DIAGNOSIS — Z809 Family history of malignant neoplasm, unspecified: Secondary | ICD-10-CM

## 2017-08-03 DIAGNOSIS — Z86010 Personal history of colonic polyps: Secondary | ICD-10-CM

## 2017-08-03 LAB — CBC WITH DIFFERENTIAL (CANCER CENTER ONLY)
Abs Granulocyte: 4 10*3/uL (ref 1.5–6.5)
Basophils Absolute: 0.1 10*3/uL (ref 0.0–0.1)
Basophils Relative: 1 %
Eosinophils Absolute: 0.2 10*3/uL (ref 0.0–0.5)
Eosinophils Relative: 3 %
HCT: 30.1 % — ABNORMAL LOW (ref 38.4–49.9)
Hemoglobin: 9.1 g/dL — ABNORMAL LOW (ref 13.0–17.1)
Lymphocytes Relative: 18 %
Lymphs Abs: 1 10*3/uL (ref 0.9–3.3)
MCH: 25.2 pg — ABNORMAL LOW (ref 27.2–33.4)
MCHC: 30.2 g/dL — ABNORMAL LOW (ref 32.0–36.0)
MCV: 83.4 fL (ref 79.3–98.0)
Monocytes Absolute: 0.5 10*3/uL (ref 0.1–0.9)
Monocytes Relative: 9 %
Neutro Abs: 4 10*3/uL (ref 1.5–6.5)
Neutrophils Relative %: 69 %
Platelet Count: 141 10*3/uL (ref 140–400)
RBC: 3.61 MIL/uL — ABNORMAL LOW (ref 4.20–5.82)
RDW: 17.8 % — ABNORMAL HIGH (ref 11.0–15.6)
WBC Count: 5.8 10*3/uL (ref 4.0–10.3)

## 2017-08-03 LAB — COMPREHENSIVE METABOLIC PANEL
ALT: 29 U/L (ref 0–55)
AST: 31 U/L (ref 5–34)
Albumin: 3.1 g/dL — ABNORMAL LOW (ref 3.5–5.0)
Alkaline Phosphatase: 83 U/L (ref 40–150)
Anion gap: 6 (ref 3–11)
BUN: 16 mg/dL (ref 7–26)
CO2: 30 mmol/L — ABNORMAL HIGH (ref 22–29)
Calcium: 9.1 mg/dL (ref 8.4–10.4)
Chloride: 103 mmol/L (ref 98–109)
Creatinine, Ser: 1.06 mg/dL (ref 0.70–1.30)
GFR calc Af Amer: >60 mL/min (ref >60)
GFR calc non Af Amer: >60 mL/min (ref >60)
Glucose, Bld: 120 mg/dL (ref 70–140)
Potassium: 3.8 mmol/L (ref 3.5–5.1)
Sodium: 139 mmol/L (ref 136–145)
Total Bilirubin: 0.5 mg/dL (ref 0.2–1.2)
Total Protein: 6.6 g/dL (ref 6.4–8.3)

## 2017-08-03 LAB — FERRITIN: Ferritin: 70 ng/mL (ref 22–316)

## 2017-08-03 LAB — IRON AND TIBC
Iron: 16 ug/dL — ABNORMAL LOW (ref 42–163)
Saturation Ratios: 5 % — ABNORMAL LOW (ref 42–163)
TIBC: 303 ug/dL (ref 202–409)
UIBC: 287 ug/dL

## 2017-08-03 NOTE — Telephone Encounter (Signed)
Scheduled appt per 1/8 los - Gave patient AVS and calender per los. - Unable to schedule Iron due to capped days - Pt aware we will call them when appt added.

## 2017-08-03 NOTE — Patient Instructions (Signed)
Thank you for choosing [facility] to provide your oncology and hematology care.  To afford each patient quality time with our providers, please arrive 30 minutes before your scheduled appointment time.  If you arrive late for your appointment, you may be asked to reschedule.  We strive to give you quality time with our providers, and arriving late affects you and other patients whose appointments are after yours.   If you are a no show for multiple scheduled visits, you may be dismissed from the clinic at the providers discretion.    Again, thank you for choosing [facility], our hope is that these requests will decrease the amount of time that you wait before being seen by our physicians.  ______________________________________________________________________  Should you have questions after your visit to the [facility], please contact our office at [phone] between the hours of 8:30 and 4:30 p.m.    Voicemails left after 4:30p.m will not be returned until the following business day.    For prescription refill requests, please have your pharmacy contact us directly.  Please also try to allow 48 hours for prescription requests.    Please contact the scheduling department for questions regarding scheduling.  For scheduling of procedures such as PET scans, CT scans, MRI, Ultrasound, etc please contact central scheduling at [phone].    Resources For Cancer Patients and Caregivers:   Oncolink.org:  A wonderful resource for patients and healthcare providers for information regarding your disease, ways to tract your treatment, what to expect, etc.     American Cancer Society:  800-227-2345  Can help patients locate various types of support and financial assistance  Cancer Care: 1-800-813-HOPE (4673) Provides financial assistance, online support groups, medication/co-pay assistance.    [place] DSS:  336-641-3447 Where to apply for food  stamps, Medicaid, and utility assistance  Medicare Rights Center: 800-333-4114 Helps people with Medicare understand their rights and benefits, navigate the Medicare system, and secure the quality healthcare they deserve  SCAT: 336-333-6589 [employer]'s shared-ride transportation service for eligible riders who have a disability that prevents them from riding the fixed route bus.    For additional information on assistance programs please contact our social worker:   Grier Hock/Abigail Elmore:  [phone]            

## 2017-08-03 NOTE — Progress Notes (Signed)
[facility]    HEMATOLOGY/ONCOLOGY CLINIC NOTE  Date of Service: 08/03/17   Patient Care Team: Eber Hong, MD as PCP - General (Internal Medicine) Satira Sark, MD as PCP - Cardiology (Cardiology) Zenovia Jarred MD (Gastroenterology)  CHIEF COMPLAINTS/PURPOSE OF CONSULTATION:  Iron deficiency anemia  HISTORY OF PRESENTING ILLNESS:   [name] is a wonderful 84 y.o. male who has been referred to [place] by Dr .Eber Hong, MD  for evaluation and management of Iron deficiency Anemia  He has a history of multiple medical comorbidities including coronary artery disease, chronic systolic CHF, atrial fibrillation on Coumadin, peripheral vascular disease, permanent pacemaker placement, adenomatous colonic polyps and long segment Barrett's esophagus without significant dysplasia on chronic PPI therapy.  Patient has been noting increasing fatigue from earlier this year and was noted to have anemia with hemoglobin of 7.8 with an MCV of 69 in January 2017. Ferritin level was noted to be 19 with an iron saturation of 3%. Fecal occult blood testing in March was noted to be positive for occult GI bleeding.  Patient received IV Feraheme in March and July 2017 for iron replacement. He had an upper endoscopy and colonoscopy which were performed on 11/12/2015. EGD revealed 4 cm of Barrett's esophagus which was biopsied and found to be Barrett's without dysplasia. There was a 3 cm hiatal hernia. Normal stomach and normal duodenum. Duodenal biopsies were negative for celiac. Colonoscopy performed on the same day revealed 2 cecal polyps. 3 ascending colon polyps one of which was 14 mm and removed with hot snare. It was prophylactically endoclipped. The remainder of the examination was unremarkable. Internal hemorrhoids were seen. These polyps were found to be adenomas without high-grade dysplasia. Has not had a capsule endoscopy.  Patient's hemoglobin levels have been somewhat better in the mid to high 9 range  but with persistent iron deficiency and lack of normalization of his anemia.  He has been referred to [place] for further management of his iron deficiency anemia. He continues to be on Coumadin for his atrial fibrillation. He has been on oral iron preparation which is causing some constipation and darker stools. Reports no overt GI bleeding. His last INR was 2.3 yesterday as per his report. He monitors this with his primary care physician. Notes persistent fatigue which is likely multifactorial.  Is on levothyroxine replacement for hypothyroidism. Takes a daily multivitamin.  Patient reports that he notes his weight closely to avoid fluid overload from CHF and notes no weight loss.  No other evidence of overt bleeding. No hematuria no gum bleeds no epistaxis. Notes easy skin bruising. Not using any over-the-counter NSAIDs.  INTERVAL HISTORY  Mr Blanton Is here for his scheduled followup for Iron deficiency anemia.  Of note since his last visit, he notes that he has had urinary issues due to b/l hernias and now has an indwelling urinary catheter.  On review of systems, pt reports no overt GI bleeding. No melena. No hematuria. Hgb today is 9.1. Ferritin is down from 138 to 70 suggesting ongoing slow GI losses. No other acute new focal symptoms. Continues to be on coumadin for atrial fibrillation.  MEDICAL HISTORY:  Past Medical History:  Diagnosis Date  . Anemia   . Arthritis   . Atrial flutter (HCC)    Amiodarone therapy, coumadin therapy, prior history of cardioversion and overdrive pacing  . Barrett's esophagus   . Benign prostatic hyperplasia   . Cardiomyopathy    Mixed ischemic and nonischemic with previous EF of 35-40%,  most recently in June 2009 normalized to 55-60%. Echo 2/11 EF 55-60%  . Carotid artery disease (HCC)    Less than 50% bilateral internal carotid artery stenosis by Doppler 2009  . Carpal tunnel syndrome   . Chronic diastolic (congestive) heart failure ([place])   .  Coronary atherosclerosis of native coronary artery    Status post coronary bypass grafting  . 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 Endocarditis   . Essential hypertension, benign   . Hiatal hernia   . Hyperlipidemia   . Hypothyroidism   . Internal hemorrhoids   . Iron deficiency anemia   . Obstructive sleep apnea   . Osteoporosis   . Paroxysmal atrial fibrillation (HCC)    Amiodarone therapy, status post Cox-Maze procedure but with recurrent atrial fibrillation while on amiodarone August 2012  . Symptomatic bradycardia   . Tubular adenoma of colon   . Valvular heart disease    Status post by prosthetic aortic valve and mitral valve repair  . Warfarin anticoagulation     SURGICAL HISTORY: Past Surgical History:  Procedure Laterality Date  . AORTIC VALVE REPLACEMENT    . CARPAL TUNNEL RELEASE  2009  . CATARACT EXTRACTION, BILATERAL  07/2011  . CORONARY ARTERY BYPASS GRAFT  2008   x2  . HERNIA REPAIR  age 38  . KNEE ARTHROSCOPY    . PACEMAKER INSERTION  2009   Initially implanted at [facility], extracted by Dr Lovena Le for endocarditis and reimplanted 8/12  . PATELLA FRACTURE SURGERY  age 76   screw placed and removed  . TEE WITHOUT CARDIOVERSION N/A 07/05/2017   Procedure: TRANSESOPHAGEAL ECHOCARDIOGRAM (TEE);  Surgeon: Sanda Klein, MD;  Location: [facility];  Service: Cardiovascular;  Laterality: N/A;  . TONSILLECTOMY  1939  . TOTAL HIP ARTHROPLASTY      SOCIAL HISTORY: Social History   Socioeconomic History  . Marital status: Married    Spouse name: Not on file  . Number of children: 3  . Years of education: Not on file  . Highest education level: Not on file  Social Needs  . Financial resource strain: Not on file  . Food insecurity - worry: Not on file  . Food insecurity - inability: Not on file  . Transportation needs - medical: Not on file  . Transportation needs - non-medical: Not on file    Occupational History  . Occupation: Retired  Tobacco Use  . Smoking status: Former Smoker    Packs/day: 0.30    Years: 8.00    Pack years: 2.40    Types: Cigarettes    Start date: 03/25/1944    Last attempt to quit: 07/27/1954    Years since quitting: 63.0  . Smokeless tobacco: Never Used  Substance and Sexual Activity  . Alcohol use: Yes    Alcohol/week: 0.0 oz    Comment: glass of wine occ  . Drug use: No  . Sexual activity: Not on file  Other Topics Concern  . Not on file  Social History Narrative  . Not on file    FAMILY HISTORY: Family History  Problem Relation Age of Onset  . Coronary artery disease Father   . Other Father        Heart attack or stroke  . Cancer Father   . Colon cancer Mother   . Diabetes Neg Hx   . Hypertension Neg Hx   . Esophageal cancer Neg Hx   . Rectal cancer Neg  Hx   . Stomach cancer Neg Hx     ALLERGIES:  has No Known Allergies.  MEDICATIONS:  Current Outpatient Medications  Medication Sig Dispense Refill  . ALOE VERA JUICE PO Take 2 oz by mouth daily.     Marland Kitchen amiodarone (PACERONE) 200 MG tablet Take 0.5 tablets (100 mg total) by mouth daily. 45 tablet 3  . amLODipine (NORVASC) 5 MG tablet Take 5 mg by mouth daily.  3  . benazepril (LOTENSIN) 40 MG tablet Take 1 tablet (40 mg total) by mouth daily. 90 tablet 3  . carvedilol (COREG) 3.125 MG tablet TAKE 1 TABLET (3.125 MG TOTAL) BY MOUTH 2 (TWO) TIMES DAILY. 180 tablet 3  . docusate sodium (COLACE) 100 MG capsule Take 200 mg by mouth daily.     Marland Kitchen FIBER PO Take 1 packet by mouth daily. ACACIA FIBER-[employer] Brand fiber therapy    . furosemide (LASIX) 40 MG tablet TAKE 2 TABLETS BY MOUTH IN THE MORNING AND TAKE 1 TAB EVERY EVEVING,MAY TAKE 1 EXTRA TAB IN AM. (Patient taking differently: 80MG  BY MOUTH IN AM (MAY TAKE EXTRA 40MG ), 40MG  IN PM) 90 tablet 2  . KLOR-CON M20 20 MEQ tablet TAKE 1 TABLET BY MOUTH DAILY 30 tablet 6  . levothyroxine (SYNTHROID, LEVOTHROID) 50 MCG tablet Take 50  mcg by mouth daily before breakfast.    . Multiple Vitamins-Minerals (THEREMS M PO) Take 1 tablet by mouth daily.      . nitroGLYCERIN (NITROSTAT) 0.4 MG SL tablet Place 1 tablet (0.4 mg total) under the tongue every 5 (five) minutes as needed. Up to 3 doses, if no relief after 3rd dose, proceed to the ED for an evaluation. 25 tablet 3  . pantoprazole (PROTONIX) 40 MG tablet TAKE 1 TABLET BY MOUTH DAILY. 90 tablet 3  . pravastatin (PRAVACHOL) 40 MG tablet TAKE 1 TABLET BY MOUTH EVERY EVENING 90 tablet 2  . Probiotic Product (PROBIOTIC DAILY PO) Take 1 tablet by mouth daily.    Marland Kitchen warfarin (COUMADIN) 2 MG tablet Take 1-2 mg by mouth See admin instructions. 1mg  by mouth once daily on Mon,Fri - 2mg  once daily all other days     No current facility-administered medications for this visit.     REVIEW OF SYSTEMS:    10 Point review of Systems was done is negative except as noted above.  PHYSICAL EXAMINATION:  ECOG PERFORMANCE STATUS: 3 - Symptomatic, >50% confined to bed  . Vitals:   08/03/17 1533  BP: 116/64  Pulse: 63  Resp: 18  Temp: 98.4 F (36.9 C)  SpO2: 93%   Filed Weights   08/03/17 1533  Weight: 172 lb 9.6 oz (78.3 kg)   .Body mass index is 28.72 kg/m.  GENERAL:alert, in no acute distress and comfortable SKIN: skin color, texture, turgor are normal, no rashes or significant lesions EYES: normal, conjunctiva are pink and non-injected, sclera clear OROPHARYNX:no exudate, no erythema and lips, buccal mucosa, and tongue normal  NECK: supple, no JVD, thyroid normal size, non-tender, without nodularity LYMPH:  no palpable lymphadenopathy in the cervical, axillary or inguinal LUNGS: clear to auscultation with normal respiratory effort HEART: regular rate & rhythm,  no murmurs and no lower extremity edema ABDOMEN: abdomen soft, non-tender, normoactive bowel sounds  Musculoskeletal: no cyanosis of digits and no clubbing  PSYCH: alert & oriented x 3 with fluent speech NEURO: no  focal motor/sensory deficits  LABORATORY DATA:  I have reviewed the data as listed  Component  Latest Ref Rng & Units 08/03/2017  WBC Count     4.0 - 10.3 K/uL 5.8  RBC     4.20 - 5.82 MIL/uL 3.61 (L)  Hemoglobin     13.0 - 17.1 g/dL 9.1 (L)  HCT     38.4 - 49.9 % 30.1 (L)  MCV     79.3 - 98.0 fL 83.4  MCH     27.2 - 33.4 pg 25.2 (L)  MCHC     32.0 - 36.0 g/dL 30.2 (L)  RDW     11.0 - 15.6 % 17.8 (H)  Platelet Count     140 - 400 K/uL 141  Neutrophils     % 69  NEUT#     1.5 - 6.5 K/uL 4.0  Abs Granulocyte     1.5 - 6.5 K/uL 4.0  Lymphocytes     % 18  Lymphocyte #     0.9 - 3.3 K/uL 1.0  Monocytes Relative     % 9  Monocyte #     0.1 - 0.9 K/uL 0.5  Eosinophil     % 3  Eosinophils Absolute     0.0 - 0.5 K/uL 0.2  Basophil     % 1  Basophils Absolute     0.0 - 0.1 K/uL 0.1  Sodium     136 - 145 mmol/L 139  Potassium     3.5 - 5.1 mmol/L 3.8  Chloride     98 - 109 mmol/L 103  CO2     22 - 29 mmol/L 30 (H)  Glucose     70 - 140 mg/dL 120  BUN     7 - 26 mg/dL 16  Creatinine     0.70 - 1.30 mg/dL 1.06  Calcium     8.4 - 10.4 mg/dL 9.1  Total Protein     6.4 - 8.3 g/dL 6.6  Albumin     3.5 - 5.0 g/dL 3.1 (L)  AST     5 - 34 U/L 31  ALT     0 - 55 U/L 29  Alkaline Phosphatase     40 - 150 U/L 83  Total Bilirubin     0.2 - 1.2 mg/dL 0.5  GFR, Est Non African American     >60 mL/min >60  GFR, Est African American     >60 mL/min >60  Anion gap     3 - 11 6  Iron     42 - 163 ug/dL 16 (L)  TIBC     202 - 409 ug/dL 303  Saturation Ratios     42 - 163 % 5 (L)  UIBC     ug/dL 287  Ferritin     22 - 316 ng/mL 70    EGD 11/12/2015 - Esophageal mucosal changes suggestive of long-segment Barrett's esophagus. Biopsied. - 3 cm hiatal hernia. - Normal stomach. - Normal examined duodenum. Biopsied.  Colonoscopy 11/12/2015 - Two 4 to 5 mm polyps in the cecum, removed with a cold snare. Resected and retrieved. - Two 4 to 5 mm polyps in the  ascending colon, removed with a cold snare. Resected and Retrieved - One 14 mm polyp in the ascending colon, removed with a hot snare. Resected and retrieved. Clip was placed. - Internal hemorrhoids.   RADIOGRAPHIC STUDIES: I have personally reviewed the radiological images as listed and agreed with the findings in the report. No results found.  ASSESSMENT & PLAN:   84 year old Caucasian male  with multiple medical comorbidities with  1) Microcytic Anemia -predominantly related to iron deficiency from chronic GI losses.  Patient has been on chronic anticoagulation for his atrial fibrillation and this will continue to be a risk factor from going GI losses. He has received IV Feraheme 2 in March and July 2017 without complete correction of his iron deficiency. This is suggestive of ongoing GI losses. Initial workup as noted below showed no monoclonal protein. No evidence of hemolysis with normal LDH and haptoglobin. Patient had IV Injectafer 750mg  qweekly x2 doses on 05/22/2016 and 05/29/2016 . He has multiple potential sources of GI bleeding especially when on Coumadin. Ferritin is down to 70 with iron saturation of 5%.  2) Long segment Barrett's esophagus without dysplasia. 3) Hiatal hernia 4) multiple adenomatous colonic polyps. Plan -His hemoglobin did improve previously improved from 8.8 to 10.5 and now to 11.3 but is back down to 9.1 with drop in ferritin levels and iron saturation -Will setup patient for IV Injectafer 750mg  weekly x 2 doses to maintain ferritin levels>100 with iron sat>20%. -high risk for ongoing GI bleeding -Limited benefit of oral iron at this time. -Avoid NSAIDs -Patient was counseled to have a discussion with his gastroenterologist, primary care physician and cardiologist regarding the risk versus benefit of ongoing anticoagulation in the setting of ongoing GI bleeding.  5). Patient Active Problem List   Diagnosis Date Noted  . Bacteremia   . Hypoxia  07/03/2017  . Iron deficiency anemia due to chronic blood loss 05/14/2016  . Barrett's esophagus without dysplasia 01/22/2016  . Anemia 08/01/2015  . Symptomatic anemia 08/01/2015  . Chronic diastolic CHF (congestive heart failure) ([place]) 08/01/2015  . Sick sinus syndrome ([place]) 03/20/2013  . Pacemaker 11/24/2011  . Warfarin anticoagulation   . Paroxysmal atrial fibrillation (HCC)   . Valvular heart disease   . Atrial flutter ([place])   . Carotid artery disease ([place])   . History of endocarditis   . HYPERLIPIDEMIA 10/10/2010  . Obstructive sleep apnea 11/01/2009  . Hypertensive cardiovascular disease 04/10/2009  . Coronary atherosclerosis of native coronary artery 04/10/2009  . Secondary cardiomyopathy ([place]) 04/10/2009   -continue f/u with cardiology and PCP as per their recommendations.  RTC with Dr Irene Limbo with labs in 3 months IV Injectafer weekly x 2 doses ASAP  All of the patients questions were answered with apparent satisfaction. The patient knows to call the clinic with any problems, questions or concerns.  I spent 20 minutes counseling the patient face to face. The total time spent in the appointment was 20 minutes and more than 50% was on counseling and direct patient cares.  Sullivan Lone MD [place] AAHIVMS [facility] Hematology/Oncology Physician [facility]  (Office):       [phone] (Work cell):  [phone] (Fax):           [phone]

## 2017-08-04 ENCOUNTER — Telehealth: Payer: Self-pay | Admitting: Hematology

## 2017-08-04 NOTE — Telephone Encounter (Signed)
Scheduled appt per 1/8 los - Patient is aware that iron appts were added - said he will let his wife know and if they have any scheduling conflicts they will give [place] a call back.

## 2017-08-05 ENCOUNTER — Inpatient Hospital Stay: Payer: Medicare Other

## 2017-08-05 ENCOUNTER — Other Ambulatory Visit: Payer: Self-pay | Admitting: Oncology

## 2017-08-05 VITALS — BP 124/57 | HR 60 | Temp 98.2°F | Resp 17

## 2017-08-05 DIAGNOSIS — D500 Iron deficiency anemia secondary to blood loss (chronic): Secondary | ICD-10-CM | POA: Diagnosis not present

## 2017-08-05 MED ORDER — SODIUM CHLORIDE 0.9 % IV SOLN
750.0000 mg | Freq: Once | INTRAVENOUS | Status: AC
  Administered 2017-08-05: 750 mg via INTRAVENOUS
  Filled 2017-08-05: qty 15

## 2017-08-05 NOTE — Progress Notes (Unsigned)
Mr. [name] was scheduled for an iron infusion today but there were no orders so I was called to write his orders.  I checked Dr. Grier Mitts notes and was not able to find whether he wanted the patient to have Feraheme or injectafer.  I discussed this with the patient and his wife and they tell me that his last infusion here, which was injected far, went fine.  Accordingly that is what I wrote today.

## 2017-08-05 NOTE — Patient Instructions (Signed)
Ferric carboxymaltose injection What is this medicine? FERRIC CARBOXYMALTOSE (ferr-ik car-box-ee-mol-toes) is an iron complex. Iron is used to make healthy red blood cells, which carry oxygen and nutrients throughout the body. This medicine is used to treat anemia in people with chronic kidney disease or people who cannot take iron by mouth. This medicine may be used for other purposes; ask your health care provider or pharmacist if you have questions. COMMON BRAND NAME(S): Injectafer What should I tell my health care provider before I take this medicine? They need to know if you have any of these conditions: -anemia not caused by low iron levels -high levels of iron in the blood -liver disease -an unusual or allergic reaction to iron, other medicines, foods, dyes, or preservatives -pregnant or trying to get pregnant -breast-feeding How should I use this medicine? This medicine is for infusion into a vein. It is given by a health care professional in a hospital or clinic setting. Talk to your pediatrician regarding the use of this medicine in children. Special care may be needed. Overdosage: If you think you have taken too much of this medicine contact a poison control center or emergency room at once. NOTE: This medicine is only for you. Do not share this medicine with others. What if I miss a dose? It is important not to miss your dose. Call your doctor or health care professional if you are unable to keep an appointment. What may interact with this medicine? Do not take this medicine with any of the following medications: -deferoxamine -dimercaprol -other iron products This medicine may also interact with the following medications: -chloramphenicol -deferasirox This list may not describe all possible interactions. Give your health care provider a list of all the medicines, herbs, non-prescription drugs, or dietary supplements you use. Also tell them if you smoke, drink alcohol, or use  illegal drugs. Some items may interact with your medicine. What should I watch for while using this medicine? Visit your doctor or health care professional regularly. Tell your doctor if your symptoms do not start to get better or if they get worse. You may need blood work done while you are taking this medicine. You may need to follow a special diet. Talk to your doctor. Foods that contain iron include: whole grains/cereals, dried fruits, beans, or peas, leafy green vegetables, and organ meats (liver, kidney). What side effects may I notice from receiving this medicine? Side effects that you should report to your doctor or health care professional as soon as possible: -allergic reactions like skin rash, itching or hives, swelling of the face, lips, or tongue -breathing problems -changes in blood pressure -feeling faint or lightheaded, falls -flushing, sweating, or hot feelings Side effects that usually do not require medical attention (report to your doctor or health care professional if they continue or are bothersome): -changes in taste -constipation -dizziness -headache -nausea -pain, redness, or irritation at site where injected -vomiting This list may not describe all possible side effects. Call your doctor for medical advice about side effects. You may report side effects to FDA at 1-800-FDA-1088. Where should I keep my medicine? This drug is given in a hospital or clinic and will not be stored at home. NOTE: This sheet is a summary. It may not cover all possible information. If you have questions about this medicine, talk to your doctor, pharmacist, or health care provider.  2018 Elsevier/Gold Standard (2015-08-15 11:20:47)  

## 2017-08-09 ENCOUNTER — Ambulatory Visit (INDEPENDENT_AMBULATORY_CARE_PROVIDER_SITE_OTHER): Payer: Medicare Other | Admitting: Cardiology

## 2017-08-09 ENCOUNTER — Encounter: Payer: Self-pay | Admitting: Cardiology

## 2017-08-09 ENCOUNTER — Ambulatory Visit: Payer: Medicare Other | Admitting: Cardiology

## 2017-08-09 VITALS — BP 122/64 | HR 72 | Ht 65.5 in | Wt 171.0 lb

## 2017-08-09 DIAGNOSIS — I5032 Chronic diastolic (congestive) heart failure: Secondary | ICD-10-CM

## 2017-08-09 DIAGNOSIS — I480 Paroxysmal atrial fibrillation: Secondary | ICD-10-CM

## 2017-08-09 DIAGNOSIS — Z01810 Encounter for preprocedural cardiovascular examination: Secondary | ICD-10-CM | POA: Diagnosis not present

## 2017-08-09 DIAGNOSIS — I495 Sick sinus syndrome: Secondary | ICD-10-CM | POA: Diagnosis not present

## 2017-08-09 NOTE — Progress Notes (Deleted)
Cardiology Office Note  Date: 08/09/2017   ID: Oneil, Behney 02-Jul-1929, MRN [id number]  PCP: Eber Hong, MD  Primary Cardiologist: Rozann Lesches, MD   No chief complaint on file.   History of Present Illness: [name] is a medically complex 84 y.o. male last seen in December 2018.  He continues on Coumadin with follow-up per Dr. Brynda Greathouse.  He follows with Dr. Rayann Heman in the device clinic, Medtronic pacemaker in place.  Past Medical History:  Diagnosis Date  . Anemia   . Arthritis   . Atrial flutter (HCC)    Amiodarone therapy, coumadin therapy, prior history of cardioversion and overdrive pacing  . Barrett's esophagus   . Benign prostatic hyperplasia   . Cardiomyopathy    Mixed ischemic and nonischemic with previous EF of 35-40%, most recently in June 2009 normalized to 55-60%. Echo 2/11 EF 55-60%  . Carotid artery disease (HCC)    Less than 50% bilateral internal carotid artery stenosis by Doppler 2009  . Carpal tunnel syndrome   . Chronic diastolic (congestive) heart failure ([place])   . Coronary atherosclerosis of native coronary artery    Status post coronary bypass grafting  . 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 Endocarditis   . Essential hypertension, benign   . Hiatal hernia   . Hyperlipidemia   . Hypothyroidism   . Internal hemorrhoids   . Iron deficiency anemia   . Obstructive sleep apnea   . Osteoporosis   . Paroxysmal atrial fibrillation (HCC)    Amiodarone therapy, status post Cox-Maze procedure but with recurrent atrial fibrillation while on amiodarone August 2012  . Symptomatic bradycardia   . Tubular adenoma of colon   . Valvular heart disease    Status post by prosthetic aortic valve and mitral valve repair  . Warfarin anticoagulation     Past Surgical History:  Procedure Laterality Date  . AORTIC VALVE REPLACEMENT    . CARPAL TUNNEL RELEASE  2009  . CATARACT EXTRACTION,  BILATERAL  07/2011  . CORONARY ARTERY BYPASS GRAFT  2008   x2  . HERNIA REPAIR  age 73  . KNEE ARTHROSCOPY    . PACEMAKER INSERTION  2009   Initially implanted at [facility], extracted by Dr Lovena Le for endocarditis and reimplanted 8/12  . PATELLA FRACTURE SURGERY  age 66   screw placed and removed  . TEE WITHOUT CARDIOVERSION N/A 07/05/2017   Procedure: TRANSESOPHAGEAL ECHOCARDIOGRAM (TEE);  Surgeon: Sanda Klein, MD;  Location: [facility];  Service: Cardiovascular;  Laterality: N/A;  . TONSILLECTOMY  1939  . TOTAL HIP ARTHROPLASTY      Current Outpatient Medications  Medication Sig Dispense Refill  . ALOE VERA JUICE PO Take 2 oz by mouth daily.     Marland Kitchen amiodarone (PACERONE) 200 MG tablet Take 0.5 tablets (100 mg total) by mouth daily. 45 tablet 3  . amLODipine (NORVASC) 5 MG tablet Take 5 mg by mouth daily.  3  . benazepril (LOTENSIN) 40 MG tablet Take 1 tablet (40 mg total) by mouth daily. 90 tablet 3  . carvedilol (COREG) 3.125 MG tablet TAKE 1 TABLET (3.125 MG TOTAL) BY MOUTH 2 (TWO) TIMES DAILY. 180 tablet 3  . docusate sodium (COLACE) 100 MG capsule Take 200 mg by mouth daily.     Marland Kitchen FIBER PO Take 1 packet by mouth daily. ACACIA FIBER-[employer] Brand fiber therapy    . furosemide (LASIX) 40 MG tablet TAKE  2 TABLETS BY MOUTH IN THE MORNING AND TAKE 1 TAB EVERY EVEVING,MAY TAKE 1 EXTRA TAB IN AM. (Patient taking differently: 80MG  BY MOUTH IN AM (MAY TAKE EXTRA 40MG ), 40MG  IN PM) 90 tablet 2  . KLOR-CON M20 20 MEQ tablet TAKE 1 TABLET BY MOUTH DAILY 30 tablet 6  . levothyroxine (SYNTHROID, LEVOTHROID) 50 MCG tablet Take 50 mcg by mouth daily before breakfast.    . Multiple Vitamins-Minerals (THEREMS M PO) Take 1 tablet by mouth daily.      . nitroGLYCERIN (NITROSTAT) 0.4 MG SL tablet Place 1 tablet (0.4 mg total) under the tongue every 5 (five) minutes as needed. Up to 3 doses, if no relief after 3rd dose, proceed to the ED for an evaluation. 25 tablet 3  . pantoprazole (PROTONIX) 40 MG  tablet TAKE 1 TABLET BY MOUTH DAILY. 90 tablet 3  . pravastatin (PRAVACHOL) 40 MG tablet TAKE 1 TABLET BY MOUTH EVERY EVENING 90 tablet 2  . Probiotic Product (PROBIOTIC DAILY PO) Take 1 tablet by mouth daily.    Marland Kitchen warfarin (COUMADIN) 2 MG tablet Take 1-2 mg by mouth See admin instructions. 1mg  by mouth once daily on Mon,Fri - 2mg  once daily all other days     No current facility-administered medications for this visit.    Allergies:  Patient has no known allergies.   Social History: The patient  reports that he quit smoking about 63 years ago. His smoking use included cigarettes. He started smoking about 73 years ago. He has a 2.40 pack-year smoking history. he has never used smokeless tobacco. He reports that he drinks alcohol. He reports that he does not use drugs.   Family History: The patient's family history includes Cancer in his father; Colon cancer in his mother; Coronary artery disease in his father; Other in his father.   ROS:  Please see the history of present illness. Otherwise, complete review of systems is positive for {NONE DEFAULTED:18576::"none"}.  All other systems are reviewed and negative.   Physical Exam: VS:  There were no vitals taken for this visit., BMI There is no height or weight on file to calculate BMI.  Wt Readings from Last 3 Encounters:  08/03/17 172 lb 9.6 oz (78.3 kg)  07/09/17 168 lb (76.2 kg)  07/06/17 162 lb 11.2 oz (73.8 kg)    General: Patient appears comfortable at rest. HEENT: Conjunctiva and lids normal, oropharynx clear with moist mucosa. Neck: Supple, no elevated JVP or carotid bruits, no thyromegaly. Lungs: Clear to auscultation, nonlabored breathing at rest. Cardiac: Regular rate and rhythm, no S3 or significant systolic murmur, no pericardial rub. Abdomen: Soft, nontender, no hepatomegaly, bowel sounds present, no guarding or rebound. Extremities: No pitting edema, distal pulses 2+. Skin: Warm and dry. Musculoskeletal: No  kyphosis. Neuropsychiatric: Alert and oriented x3, affect grossly appropriate.  ECG: I personally reviewed the tracing from 07/03/2017 which showed dual-chamber pacing.  Recent Labwork: 07/03/2017: B Natriuretic Peptide 695.9; Magnesium 2.2 07/04/2017: TSH 2.303 07/06/2017: Hemoglobin 8.5; Platelets 206 08/03/2017: ALT 29; AST 31; BUN 16; Creatinine, Ser 1.06; Potassium 3.8; Sodium 139   Other Studies Reviewed Today:  TEE 07/05/2017: Study Conclusions  - Left ventricle: There was mild concentric hypertrophy. Systolic function was normal. The estimated ejection fraction was in the range of 55% to 60%. Wall motion was normal; there were no regional wall motion abnormalities. - Aortic valve: A 21.0 cmCarpentier-Edwards pericardial bioprosthesis was present. The prosthesis had a normal range of motion. The sewing ring appeared normal. No evidence  of vegetation. Valve area (VTI): 0.77 cm^2. Valve area (Vmax): 0.77 cm^2. Valve area (Vmean): 0.76 cm^2. - Mitral valve: Prior procedures included surgical repair. An annular ring prosthesis was present and functioning normally. No evidence of vegetation. There was mild to moderate regurgitation directed eccentrically and toward the septum. Mean gradient (D): 4 mm Hg at a heart rate of 70 bpm. Valve area by pressure half-time: 2 cm^2. Valve area by continuity equation (using LVOT flow): 1.29 cm^2. - Left atrium: The atrium was severely dilated. No evidence of thrombus in the atrial cavity or appendage. There was mildcontinuous spontaneous echo contrast (&quot;smoke&quot;) in the cavity and the appendage. There is only a remnant of the appendage, which appears to be surgically resected or clipped. - Right ventricle: The cavity size was mildly dilated. - Right atrium: The atrium was severely dilated. - Tricuspid valve: No evidence of vegetation. There was moderate regurgitation. - Pulmonic valve: No evidence  of vegetation.  Impressions:  - The very high transvalvular gradients through the aortic valve prosthesis, reported on the transthoracic study, are not confirmed on the current study, but the gradients are nevertheless abnormally high.  Recommendations: No evidence of vegetation or perivalvular abscess. The aortic valve gradients are lower than on the recent transthoracic study and may be underestimated due to poor Doppler beam alignment. Even so, there is a big discrepancy between the normal leaflet motion and the elevated gradients. This is likely explained by a combination of patient valve mismatch (dimensionless index 0.23) and high cardiac output (e.g. anemia, infection, etc.).  Assessment and Plan:    Current medicines were reviewed with the patient today.  No orders of the defined types were placed in this encounter.   Disposition:  Signed, Satira Sark, MD, [facility] 08/09/2017 8:54 AM    [facility] [place], [place], [place] [address] Phone: [phone]; Fax: [phone]

## 2017-08-09 NOTE — Progress Notes (Signed)
Cardiology Office Note  Date: 08/09/2017   ID: De Hollingshead, DOB [birth year], MRN [id number]  PCP: Eber Hong, MD  Primary Cardiologist: Rozann Lesches, MD   Chief Complaint  Patient presents with  . Diastolic heart failure  . Preoperative cardiac evaluation    History of Present Illness: [name] is a medically complex 84 y.o. male last seen in December 2018. He is here with his wife for a follow-up visit. From a cardiac perspective he reports no significant change in weight, no increasing shortness of breath with limited activity at baseline. Also no chest pain or palpitations. He still has an indwelling Foley catheter, this has been changed out by urology. My understanding is that he is being evaluated for surgical repair of a "double hernia" in an effort to improve his urination and give him the opportunity to get the catheter out.  He continues on Coumadin with follow-up per Dr. Brynda Greathouse. I anticipate that he will need a Lovenox bridge when he comes off of Coumadin for hernia surgery.  He follows with Dr. Rayann Heman in the device clinic, Medtronic pacemaker in place. Device followed remotely as well.  Reviewed his current medications which are outlined below. He has been taking total 120 mg Lasix daily but stable weights. Blood pressure is well controlled today on Norvasc and Lotensin.  Past Medical History:  Diagnosis Date  . Anemia   . Arthritis   . Atrial flutter (HCC)    Amiodarone therapy, coumadin therapy, prior history of cardioversion and overdrive pacing  . Barrett's esophagus   . Benign prostatic hyperplasia   . Cardiomyopathy    Mixed ischemic and nonischemic with previous EF of 35-40%, most recently in June 2009 normalized to 55-60%. Echo 2/11 EF 55-60%  . Carotid artery disease (HCC)    Less than 50% bilateral internal carotid artery stenosis by Doppler 2009  . Carpal tunnel syndrome   . Chronic diastolic (congestive) heart failure ([place])   . Coronary  atherosclerosis of native coronary artery    Status post coronary bypass grafting  . 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 Endocarditis   . Essential hypertension, benign   . Hiatal hernia   . Hyperlipidemia   . Hypothyroidism   . Internal hemorrhoids   . Iron deficiency anemia   . Obstructive sleep apnea   . Osteoporosis   . Paroxysmal atrial fibrillation (HCC)    Amiodarone therapy, status post Cox-Maze procedure but with recurrent atrial fibrillation while on amiodarone August 2012  . Symptomatic bradycardia   . Tubular adenoma of colon   . 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 Warfarin anticoagulation     Past Surgical History:  Procedure Laterality Date  . AORTIC VALVE REPLACEMENT    . CARPAL TUNNEL RELEASE  2009  . CATARACT EXTRACTION, BILATERAL  07/2011  . CORONARY ARTERY BYPASS GRAFT  2008   x2  . HERNIA REPAIR  age 8  . KNEE ARTHROSCOPY    . PACEMAKER INSERTION  2009   Initially implanted at [facility], extracted by Dr Lovena Le for endocarditis and reimplanted 8/12  . PATELLA FRACTURE SURGERY  age 50   screw placed and removed  . TEE WITHOUT CARDIOVERSION N/A 07/05/2017   Procedure: TRANSESOPHAGEAL ECHOCARDIOGRAM (TEE);  Surgeon: Sanda Klein, MD;  Location: [place];  Service: Cardiovascular;  Laterality: N/A;  . TONSILLECTOMY  1939  . TOTAL  HIP ARTHROPLASTY      Current Outpatient Medications  Medication Sig Dispense Refill  . ALOE VERA JUICE PO Take 2 oz by mouth daily.     Marland Kitchen amiodarone (PACERONE) 200 MG tablet Take 0.5 tablets (100 mg total) by mouth daily. 45 tablet 3  . amLODipine (NORVASC) 5 MG tablet Take 5 mg by mouth daily.  3  . benazepril (LOTENSIN) 40 MG tablet Take 1 tablet (40 mg total) by mouth daily. 90 tablet 3  . carvedilol (COREG) 3.125 MG tablet TAKE 1 TABLET (3.125 MG TOTAL) BY MOUTH 2 (TWO) TIMES DAILY. 180 tablet 3  . docusate  sodium (COLACE) 100 MG capsule Take 200 mg by mouth daily.     Marland Kitchen FIBER PO Take 1 packet by mouth daily. ACACIA FIBER-[employer] Brand fiber therapy    . furosemide (LASIX) 40 MG tablet TAKE 2 TABLETS BY MOUTH IN THE MORNING AND TAKE 1 TAB EVERY EVEVING,MAY TAKE 1 EXTRA TAB IN AM. (Patient taking differently: 80MG  BY MOUTH IN AM (MAY TAKE EXTRA 40MG ), 40MG  IN PM) 90 tablet 2  . KLOR-CON M20 20 MEQ tablet TAKE 1 TABLET BY MOUTH DAILY 30 tablet 6  . levothyroxine (SYNTHROID, LEVOTHROID) 50 MCG tablet Take 50 mcg by mouth daily before breakfast.    . Multiple Vitamins-Minerals (THEREMS M PO) Take 1 tablet by mouth daily.      . nitroGLYCERIN (NITROSTAT) 0.4 MG SL tablet Place 1 tablet (0.4 mg total) under the tongue every 5 (five) minutes as needed. Up to 3 doses, if no relief after 3rd dose, proceed to the ED for an evaluation. 25 tablet 3  . pantoprazole (PROTONIX) 40 MG tablet TAKE 1 TABLET BY MOUTH DAILY. 90 tablet 3  . pravastatin (PRAVACHOL) 40 MG tablet TAKE 1 TABLET BY MOUTH EVERY EVENING 90 tablet 2  . Probiotic Product (PROBIOTIC DAILY PO) Take 1 tablet by mouth daily.    Marland Kitchen warfarin (COUMADIN) 2 MG tablet Take 1-2 mg by mouth See admin instructions. 1mg  by mouth once daily on Mon,Fri - 2mg  once daily all other days     No current facility-administered medications for this visit.    Allergies:  Patient has no known allergies.   Social History: The patient  reports that he quit smoking about 63 years ago. His smoking use included cigarettes. He started smoking about 73 years ago. He has a 2.40 pack-year smoking history. he has never used smokeless tobacco. He reports that he drinks alcohol. He reports that he does not use drugs.   ROS:  Please see the history of present illness. Otherwise, complete review of systems is positive for hearing loss, leg weakness.  All other systems are reviewed and negative.   Physical Exam: VS:  BP 122/64   Pulse 72   Ht 5' 5.5" (1.664 m)   Wt 171 lb (77.6  kg)   SpO2 98%   BMI 28.02 kg/m , BMI Body mass index is 28.02 kg/m.  Wt Readings from Last 3 Encounters:  08/09/17 171 lb (77.6 kg)  08/03/17 172 lb 9.6 oz (78.3 kg)  07/09/17 168 lb (76.2 kg)    General: Chronically ill-appearing elderly male, seated in wheelchair. No distress. HEENT: Conjunctiva and lids normal, oropharynx clear. Neck: Supple, no elevated JVP or carotid bruits, no thyromegaly. Lungs: Clear to auscultation, nonlabored breathing at rest. Cardiac: Regular rate and rhythm, no S3, 2/6 systolic murmur, no pericardial rub. Abdomen: Soft, nontender, bowel sounds present, no guarding or rebound. Extremities: Mild or leg  edema, distal pulses 2+. Skin: Warm and dry. Musculoskeletal: No kyphosis. Neuropsychiatric: Alert and oriented x3, affect grossly appropriate.  ECG: I personally reviewed the tracing from 07/03/2017 which showed dual-chamber pacing.  Recent Labwork: 07/03/2017: B Natriuretic Peptide 695.9; Magnesium 2.2 07/04/2017: TSH 2.303 07/06/2017: Hemoglobin 8.5; Platelets 206 08/03/2017: ALT 29; AST 31; BUN 16; Creatinine, Ser 1.06; Potassium 3.8; Sodium 139   Other Studies Reviewed Today:  TEE 07/05/2017: Study Conclusions  - Left ventricle: There was mild concentric hypertrophy. Systolic function was normal. The estimated ejection fraction was in the range of 55% to 60%. Wall motion was normal; there were no regional wall motion abnormalities. - Aortic valve: A 21.0 cmCarpentier-Edwards pericardial bioprosthesis was present. The prosthesis had a normal range of motion. The sewing ring appeared normal. No evidence of vegetation. Valve area (VTI): 0.77 cm^2. Valve area (Vmax): 0.77 cm^2. Valve area (Vmean): 0.76 cm^2. - Mitral valve: Prior procedures included surgical repair. An annular ring prosthesis was present and functioning normally. No evidence of vegetation. There was mild to moderate regurgitation directed eccentrically and  toward the septum. Mean gradient (D): 4 mm Hg at a heart rate of 70 bpm. Valve area by pressure half-time: 2 cm^2. Valve area by continuity equation (using LVOT flow): 1.29 cm^2. - Left atrium: The atrium was severely dilated. No evidence of thrombus in the atrial cavity or appendage. There was mildcontinuous spontaneous echo contrast (&quot;smoke&quot;) in the cavity and the appendage. There is only a remnant of the appendage, which appears to be surgically resected or clipped. - Right ventricle: The cavity size was mildly dilated. - Right atrium: The atrium was severely dilated. - Tricuspid valve: No evidence of vegetation. There was moderate regurgitation. - Pulmonic valve: No evidence of vegetation.  Impressions:  - The very high transvalvular gradients through the aortic valve prosthesis, reported on the transthoracic study, are not confirmed on the current study, but the gradients are nevertheless abnormally high.  Recommendations: No evidence of vegetation or perivalvular abscess. The aortic valve gradients are lower than on the recent transthoracic study and may be underestimated due to poor Doppler beam alignment. Even so, there is a big discrepancy between the normal leaflet motion and the elevated gradients. This is likely explained by a combination of patient valve mismatch (dimensionless index 0.23) and high cardiac output (e.g. anemia, infection, etc.).  Assessment and Plan:  1. Preoperative evaluation in an 84 year old male with CAD status post CABG, paroxysmal atrial fibrillation, valvular heart disease including bioprosthetic AVR and mitral valve repair, sick sinus syndrome with Medtronic pacemaker in place, and chronic diastolic heart failure. He is being considered for hernia repair, details forthcoming. He is relatively stable from a cardiac clinical perspective in terms of rhythm management and volume status. Also not reporting any  angina symptoms at current level of activity. Although his medical complexity places his perioperative cardiac risk in the intermediate to high range, this does not preclude proceeding with surgery, particularly if this will provide improvement in his quality of life and also give him the opportunity to remove the indwelling urinary catheter, thereby reducing his risk of infection. Coumadin will need to be bridged with Lovenox when discontinued prior to surgery, this is followed by his PCP Dr. Brynda Greathouse.  2. Paroxysmal atrial fibrillation. He continues on low-dose amiodarone and Coumadin.  3. Sick sinus syndrome with Medtronic pacemaker in place. This is followed by Dr. Rayann Heman.  4. Chronic diastolic heart failure, weights are relatively stable on current dose of Lasix. LVEF 55-60% range  by recent assessment.  Current medicines were reviewed with the patient today.  Disposition: Follow-up in 6 weeks.  Signed, Satira Sark, MD, [facility] 08/09/2017 3:33 PM    [facility] at [place], [place], [place] [address] Phone: [phone]; Fax: [phone]

## 2017-08-09 NOTE — Patient Instructions (Signed)
Medication Instructions:  Your physician recommends that you continue on your current medications as directed. Please refer to the Current Medication list given to you today.   Labwork: NONE   Testing/Procedures: NONE   Follow-Up: Your physician recommends that you schedule a follow-up appointment in: 6 Weeks in [place] with Dr. Domenic Polite.    Any Other Special Instructions Will Be Listed Below (If Applicable).     If you need a refill on your cardiac medications before your next appointment, please call your pharmacy.  Thank you for choosing [facility]!

## 2017-08-10 ENCOUNTER — Other Ambulatory Visit: Payer: Self-pay | Admitting: General Surgery

## 2017-08-11 ENCOUNTER — Other Ambulatory Visit: Payer: Self-pay | Admitting: Cardiology

## 2017-08-12 ENCOUNTER — Inpatient Hospital Stay: Payer: Medicare Other

## 2017-08-12 VITALS — BP 110/56 | HR 60 | Temp 98.7°F | Resp 17

## 2017-08-12 DIAGNOSIS — D500 Iron deficiency anemia secondary to blood loss (chronic): Secondary | ICD-10-CM | POA: Diagnosis not present

## 2017-08-12 MED ORDER — SODIUM CHLORIDE 0.9 % IV SOLN
750.0000 mg | Freq: Once | INTRAVENOUS | Status: AC
  Administered 2017-08-12: 750 mg via INTRAVENOUS
  Filled 2017-08-12: qty 15

## 2017-08-12 NOTE — Patient Instructions (Signed)
Ferric carboxymaltose injection What is this medicine? FERRIC CARBOXYMALTOSE (ferr-ik car-box-ee-mol-toes) is an iron complex. Iron is used to make healthy red blood cells, which carry oxygen and nutrients throughout the body. This medicine is used to treat anemia in people with chronic kidney disease or people who cannot take iron by mouth. This medicine may be used for other purposes; ask your health care provider or pharmacist if you have questions. COMMON BRAND NAME(S): Injectafer What should I tell my health care provider before I take this medicine? They need to know if you have any of these conditions: -anemia not caused by low iron levels -high levels of iron in the blood -liver disease -an unusual or allergic reaction to iron, other medicines, foods, dyes, or preservatives -pregnant or trying to get pregnant -breast-feeding How should I use this medicine? This medicine is for infusion into a vein. It is given by a health care professional in a hospital or clinic setting. Talk to your pediatrician regarding the use of this medicine in children. Special care may be needed. Overdosage: If you think you have taken too much of this medicine contact a poison control center or emergency room at once. NOTE: This medicine is only for you. Do not share this medicine with others. What if I miss a dose? It is important not to miss your dose. Call your doctor or health care professional if you are unable to keep an appointment. What may interact with this medicine? Do not take this medicine with any of the following medications: -deferoxamine -dimercaprol -other iron products This medicine may also interact with the following medications: -chloramphenicol -deferasirox This list may not describe all possible interactions. Give your health care provider a list of all the medicines, herbs, non-prescription drugs, or dietary supplements you use. Also tell them if you smoke, drink alcohol, or use  illegal drugs. Some items may interact with your medicine. What should I watch for while using this medicine? Visit your doctor or health care professional regularly. Tell your doctor if your symptoms do not start to get better or if they get worse. You may need blood work done while you are taking this medicine. You may need to follow a special diet. Talk to your doctor. Foods that contain iron include: whole grains/cereals, dried fruits, beans, or peas, leafy green vegetables, and organ meats (liver, kidney). What side effects may I notice from receiving this medicine? Side effects that you should report to your doctor or health care professional as soon as possible: -allergic reactions like skin rash, itching or hives, swelling of the face, lips, or tongue -breathing problems -changes in blood pressure -feeling faint or lightheaded, falls -flushing, sweating, or hot feelings Side effects that usually do not require medical attention (report to your doctor or health care professional if they continue or are bothersome): -changes in taste -constipation -dizziness -headache -nausea -pain, redness, or irritation at site where injected -vomiting This list may not describe all possible side effects. Call your doctor for medical advice about side effects. You may report side effects to FDA at 1-800-FDA-1088. Where should I keep my medicine? This drug is given in a hospital or clinic and will not be stored at home. NOTE: This sheet is a summary. It may not cover all possible information. If you have questions about this medicine, talk to your doctor, pharmacist, or health care provider.  2018 Elsevier/Gold Standard (2015-08-15 11:20:47)  

## 2017-08-24 ENCOUNTER — Other Ambulatory Visit: Payer: Self-pay

## 2017-08-24 ENCOUNTER — Encounter (HOSPITAL_COMMUNITY): Payer: Self-pay

## 2017-08-24 ENCOUNTER — Encounter (HOSPITAL_COMMUNITY)
Admission: RE | Admit: 2017-08-24 | Discharge: 2017-08-24 | Disposition: A | Discharge status: Home / Self Care | Payer: Medicare Other | Source: Ambulatory Visit | Attending: General Surgery | Admitting: General Surgery

## 2017-08-24 DIAGNOSIS — Z01818 Encounter for other preprocedural examination: Secondary | ICD-10-CM | POA: Diagnosis not present

## 2017-08-24 DIAGNOSIS — K4090 Unilateral inguinal hernia, without obstruction or gangrene, not specified as recurrent: Secondary | ICD-10-CM | POA: Diagnosis not present

## 2017-08-24 HISTORY — DX: Gastro-esophageal reflux disease without esophagitis: K21.9

## 2017-08-24 HISTORY — DX: Presence of spectacles and contact lenses: Z97.3

## 2017-08-24 HISTORY — DX: Presence of cardiac pacemaker: Z95.0

## 2017-08-24 HISTORY — DX: Presence of dental prosthetic device (complete) (partial): Z97.2

## 2017-08-24 HISTORY — DX: Unspecified hearing loss, unspecified ear: H91.90

## 2017-08-24 LAB — CBC
HCT: 32.9 % — ABNORMAL LOW (ref 39.0–52.0)
Hemoglobin: 10.3 g/dL — ABNORMAL LOW (ref 13.0–17.0)
MCH: 27 pg (ref 26.0–34.0)
MCHC: 31.3 g/dL (ref 30.0–36.0)
MCV: 86.1 fL (ref 78.0–100.0)
Platelets: 115 10*3/uL — ABNORMAL LOW (ref 150–400)
RBC: 3.82 MIL/uL — ABNORMAL LOW (ref 4.22–5.81)
RDW: 23.2 % — ABNORMAL HIGH (ref 11.5–15.5)
WBC: 5.1 10*3/uL (ref 4.0–10.5)

## 2017-08-24 LAB — BASIC METABOLIC PANEL
Anion gap: 10 (ref 5–15)
BUN: 13 mg/dL (ref 6–20)
CO2: 25 mmol/L (ref 22–32)
Calcium: 8.9 mg/dL (ref 8.9–10.3)
Chloride: 103 mmol/L (ref 101–111)
Creatinine, Ser: 0.93 mg/dL (ref 0.61–1.24)
GFR calc Af Amer: >60 mL/min (ref >60)
GFR calc non Af Amer: >60 mL/min (ref >60)
Glucose, Bld: 89 mg/dL (ref 65–99)
Potassium: 4 mmol/L (ref 3.5–5.1)
Sodium: 138 mmol/L (ref 135–145)

## 2017-08-24 NOTE — Pre-Procedure Instructions (Signed)
[name]  08/24/2017      [id number] - [place], [place] - [address] [place] [place] [address] Phone: [phone] Fax: [phone]    Your procedure is scheduled on Monday, August 30, 2017.  Report to [facility] Admitting at 7:00 A.M.  Call this number if you have problems the morning of surgery:  [phone]   Remember: Drink 2 bottles of Pre-Surgery Ensure the night before surgery and 1 bottle before leaving home the morning of surgery.  Follow doctors instructions regarding warfarin (COUMADIN) and enoxaparin (LOVENOX)  Injection   Do not eat food or drink liquids after midnight Sunday, August 29, 2017  Take these medicines the morning of surgery with A SIP OF WATER: amiodarone (PACERONE),  amLODipine (NORVASC),  carvedilol (COREG),  levothyroxine (SYNTHROID, LEVOTHROID), pantoprazole (PROTONIX), if needed:nitroGLYCERIN (NITROSTAT) for chest pain   Stop taking vitamins, fish oil, ALOE VERA and herbal medications. Do not take any NSAIDs ie: Ibuprofen, Advil, Naproxen(Aleve), Motrin, BC and Goody Powder; stop now.  Do not wear jewelry, make-up or nail polish.  Do not wear lotions, powders, or perfumes, or deodorant.  Do not shave 48 hours prior to surgery.  Men may shave face and neck.  Do not bring valuables to the hospital.  [facility] is not responsible for any belongings or valuables.  Contacts, dentures or bridgework may not be worn into surgery.  Leave your suitcase in the car.  After surgery it may be brought to your room. For patients admitted to the hospital, discharge time will be determined by your treatment team. Patients discharged the day of surgery will not be allowed to drive home.   Special instructions:   [facility] - Preparing for Surgery  Before surgery, you can play an important role.  Because skin is not sterile, your skin needs to be as free of germs as possible.  You can reduce the number of germs on  you skin by washing with CHG (chlorahexidine gluconate) soap before surgery.  CHG is an antiseptic cleaner which kills germs and bonds with the skin to continue killing germs even after washing.  Please DO NOT use if you have an allergy to CHG or antibacterial soaps.  If your skin becomes reddened/irritated stop using the CHG and inform your nurse when you arrive at Short Stay.  Do not shave (including legs and underarms) for at least 48 hours prior to the first CHG shower.  You may shave your face.  Please follow these instructions carefully:   1.  Shower with CHG Soap the night before surgery and the morning of Surgery.  2.  If you choose to wash your hair, wash your hair first as usual with your normal shampoo.  3.  After you shampoo, rinse your hair and body thoroughly to remove the Shampoo.  4.  Use CHG as you would any other liquid soap.  You can apply chg directly  to the skin and wash gently with scrungie or a clean washcloth.  5.  Apply the CHG Soap to your body ONLY FROM THE NECK DOWN.  Do not use on open wounds or open sores.  Avoid contact with your eyes, ears, mouth and genitals (private parts).  Wash genitals (private parts) with your normal soap.  6.  Wash thoroughly, paying special attention to the area where your surgery will be performed.  7.  Thoroughly rinse your body with warm water from the neck down.  8.  DO  NOT shower/wash with your normal soap after using and rinsing off the CHG Soap.  9.  Pat yourself dry with a clean towel.            10.  Wear clean pajamas.            11.  Place clean sheets on your bed the night of your first shower and do not sleep with pets.  Day of Surgery  Do not apply any lotions/deodorants the morning of surgery.  Please wear clean clothes to the hospital/surgery center.  Please read over the following fact sheets that you were given. Pain Booklet, Coughing and Deep Breathing and Surgical Site Infection Prevention

## 2017-08-24 NOTE — Progress Notes (Signed)
Pt denies any acute cardiopulmonary issues. Pt under the care of Dr. Domenic Polite, Cardiology. Anesthesia asked to review pt history.

## 2017-08-25 NOTE — Progress Notes (Signed)
Anesthesia Chart Review:  Pt is an 84 year old male scheduled for L inguinal hernia repair with mesh on 08/30/2017 with Rolm Bookbinder, MD  Providers:  - PCP is Eber Hong, MD who cleared pt for surgery at last office visit 07/16/17 noting: "The patient is at significant risk for complications if he has surgery but he is willing to accept those risks given the major he is in now with the catheter and the hernias" (notes in care everywhere)    - Hematologist is Sullivan Lone, MD  - Cardiologist is Rozann Lesches, MD who cleared pt for surgery at last office visit 08/09/17, noting "although his medical complexity places his perioperative cardiac risk in the intermediate to high range, this does not preclude proceeding with surgery, particularly if this will provide improvement in his quality of life and also give him the opportunity to remove the indwelling urinary catheter, thereby reducing his risk of infection"  - EP cardiologist is Thompson Grayer, MD. Last office visit 06/04/17   PMH includes:  CAD (s/p CABG 2008 at [facility]), mixed ischemic and nonischemic cardiomyopathy, pacemaker ([employer]), chronic diastolic CHF, valvular heart disease (s/p 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 2008 at [facility]), PAF and atrial flutter (s/p MAZE and LAA stapling), endocarditis (s/p pacemaker extraction and reinsertion 2012), carotid artery disease, HTN, hyperlipidemia, anemia, iron deficiency anemia, OSA, hypothyroidism, GERD. Hard of hearing. Former smoker. BMI 27  - Hospitalized 12/8 - 07/06/17 for hypoxia (unknown trigger- O2 sats 89% on RA initially), CHF (received diuretics)   Medications include: amiodarone, amlodipine, benazepril, carvedilol, lovenox, lasix, potassium, levothyroxine, protonix, pravastatin, coumadin.  Last dose coumadin 08/24/17. Lovenox will start 08/27/17.   BP 119/67   Pulse 87   Temp 36.7 C   Resp 18   Ht 5' 6.5" (1.689 m)   Wt 171 lb 6.4 oz (77.7 kg)   SpO2 98%   BMI  27.25 kg/m   Preoperative labs reviewed.   - Platelets 115.  Platelets have ranged from 119-206 over last 14 months. Hx thrombocytopenia per PCP notes.  - PT/INR day of surgery  1 view CXR 07/04/17:  1. No acute cardiopulmonary process. 2. Stable cardiomegaly.  EKG 07/03/17: AV sequential or dual chamber electronic pacemaker  TEE 07/05/17:  - Left ventricle: There was mild concentric hypertrophy. Systolic function was normal. The estimated ejection fraction was in the range of 55% to 60%. Wall motion was normal; there were no regional wall motion abnormalities. - Aortic valve: A 21.0 cmCarpentier-Edwards pericardial bioprosthesis was present. The prosthesis had a normal range of motion. The sewing ring appeared normal. No evidence of vegetation. Valve area (VTI): 0.77 cm^2. Valve area (Vmax): 0.77 cm^2. Valve area (Vmean): 0.76 cm^2. - Mitral valve: Prior procedures included surgical repair. An annular ring prosthesis was present and functioning normally. No evidence of vegetation. There was mild to moderate regurgitation directed eccentrically and toward the septum. Mean gradient (D): 4 mm Hg at a heart rate of 70 bpm. Valve area by pressure half-time: 2 cm^2. Valve area by continuity equation (using LVOT flow): 1.29 cm^2. - Left atrium: The atrium was severely dilated. No evidence of thrombus in the atrial cavity or appendage. There was mildcontinuous spontaneous echo contrast (&quot;smoke&quot;) in the cavity and the appendage. There is only a remnant of the appendage, which appears to be surgically resected or clipped. - Right ventricle: The cavity size was mildly dilated. - Right atrium: The atrium was severely dilated. - Tricuspid valve: No evidence of vegetation. There was  moderate regurgitation. - Pulmonic valve: No evidence of vegetation. - Impressions: The very high transvalvular gradients through the aortic valve   prosthesis, reported on the transthoracic study, are not confirmed on  the current study, but the gradients are nevertheless abnormally high.   Perioperative prescription form for pacemaker indicates procedure may interfere with device function.  Magnet should be placed over device during procedure.  Postop interrogation not needed.  If no changes, I anticipate pt can proceed with surgery as scheduled.   Willeen Cass, FNP-BC [facility]/Anesthesiology Phone: [phone] 08/25/2017 1:13 PM

## 2017-08-30 ENCOUNTER — Other Ambulatory Visit: Payer: Self-pay

## 2017-08-30 ENCOUNTER — Observation Stay (HOSPITAL_COMMUNITY)
Admission: RE | Admit: 2017-08-30 | Discharge: 2017-08-31 | Disposition: A | Discharge status: Home / Self Care | Payer: Medicare Other | Source: Ambulatory Visit | Attending: General Surgery | Admitting: General Surgery

## 2017-08-30 ENCOUNTER — Ambulatory Visit (HOSPITAL_COMMUNITY): Payer: Medicare Other | Admitting: Anesthesiology

## 2017-08-30 ENCOUNTER — Encounter (HOSPITAL_COMMUNITY)
Admission: RE | Disposition: A | Discharge status: Home / Self Care | Payer: Self-pay | Source: Ambulatory Visit | Attending: General Surgery

## 2017-08-30 ENCOUNTER — Encounter (HOSPITAL_COMMUNITY): Payer: Self-pay

## 2017-08-30 ENCOUNTER — Ambulatory Visit (HOSPITAL_COMMUNITY): Payer: Medicare Other | Admitting: Emergency Medicine

## 2017-08-30 DIAGNOSIS — G4733 Obstructive sleep apnea (adult) (pediatric): Secondary | ICD-10-CM | POA: Diagnosis not present

## 2017-08-30 DIAGNOSIS — I509 Heart failure, unspecified: Secondary | ICD-10-CM | POA: Insufficient documentation

## 2017-08-30 DIAGNOSIS — G4730 Sleep apnea, unspecified: Secondary | ICD-10-CM | POA: Diagnosis not present

## 2017-08-30 DIAGNOSIS — N400 Enlarged prostate without lower urinary tract symptoms: Secondary | ICD-10-CM | POA: Insufficient documentation

## 2017-08-30 DIAGNOSIS — Z87891 Personal history of nicotine dependence: Secondary | ICD-10-CM | POA: Insufficient documentation

## 2017-08-30 DIAGNOSIS — I4891 Unspecified atrial fibrillation: Secondary | ICD-10-CM | POA: Insufficient documentation

## 2017-08-30 DIAGNOSIS — Z951 Presence of aortocoronary bypass graft: Secondary | ICD-10-CM | POA: Insufficient documentation

## 2017-08-30 DIAGNOSIS — K4090 Unilateral inguinal hernia, without obstruction or gangrene, not specified as recurrent: Secondary | ICD-10-CM

## 2017-08-30 DIAGNOSIS — E7800 Pure hypercholesterolemia, unspecified: Secondary | ICD-10-CM | POA: Diagnosis not present

## 2017-08-30 DIAGNOSIS — R011 Cardiac murmur, unspecified: Secondary | ICD-10-CM | POA: Insufficient documentation

## 2017-08-30 DIAGNOSIS — Z79899 Other long term (current) drug therapy: Secondary | ICD-10-CM | POA: Insufficient documentation

## 2017-08-30 DIAGNOSIS — E039 Hypothyroidism, unspecified: Secondary | ICD-10-CM | POA: Diagnosis not present

## 2017-08-30 DIAGNOSIS — K219 Gastro-esophageal reflux disease without esophagitis: Secondary | ICD-10-CM | POA: Diagnosis not present

## 2017-08-30 DIAGNOSIS — K4030 Unilateral inguinal hernia, with obstruction, without gangrene, not specified as recurrent: Secondary | ICD-10-CM | POA: Diagnosis not present

## 2017-08-30 DIAGNOSIS — Z7901 Long term (current) use of anticoagulants: Secondary | ICD-10-CM | POA: Diagnosis not present

## 2017-08-30 DIAGNOSIS — Z952 Presence of prosthetic heart valve: Secondary | ICD-10-CM | POA: Insufficient documentation

## 2017-08-30 DIAGNOSIS — I110 Hypertensive heart disease with heart failure: Secondary | ICD-10-CM | POA: Insufficient documentation

## 2017-08-30 DIAGNOSIS — I739 Peripheral vascular disease, unspecified: Secondary | ICD-10-CM | POA: Diagnosis not present

## 2017-08-30 DIAGNOSIS — M1990 Unspecified osteoarthritis, unspecified site: Secondary | ICD-10-CM | POA: Insufficient documentation

## 2017-08-30 DIAGNOSIS — I2510 Atherosclerotic heart disease of native coronary artery without angina pectoris: Secondary | ICD-10-CM | POA: Diagnosis not present

## 2017-08-30 HISTORY — PX: INSERTION OF MESH: SHX5868

## 2017-08-30 HISTORY — PX: INGUINAL HERNIA REPAIR: SHX194

## 2017-08-30 HISTORY — DX: Unilateral inguinal hernia, without obstruction or gangrene, not specified as recurrent: K40.90

## 2017-08-30 LAB — PROTIME-INR
INR: 1.39
Prothrombin Time: 17 seconds — ABNORMAL HIGH (ref 11.4–15.2)

## 2017-08-30 SURGERY — REPAIR, HERNIA, INGUINAL, ADULT
Anesthesia: General | Site: Inguinal | Laterality: Left

## 2017-08-30 MED ORDER — 0.9 % SODIUM CHLORIDE (POUR BTL) OPTIME
TOPICAL | Status: DC | PRN
  Administered 2017-08-30: 1000 mL

## 2017-08-30 MED ORDER — ONDANSETRON HCL 4 MG/2ML IJ SOLN: 4.0000 mg | Freq: Four times a day (QID) | INTRAMUSCULAR | Status: DC | PRN

## 2017-08-30 MED ORDER — POTASSIUM CHLORIDE CRYS ER 20 MEQ PO TBCR
20.0000 meq | EXTENDED_RELEASE_TABLET | Freq: Every day | ORAL | Status: DC
  Administered 2017-08-31: 20 meq via ORAL
  Filled 2017-08-30: qty 1

## 2017-08-30 MED ORDER — FENTANYL CITRATE (PF) 250 MCG/5ML IJ SOLN
INTRAMUSCULAR | Status: DC | PRN
  Administered 2017-08-30: 100 ug via INTRAVENOUS

## 2017-08-30 MED ORDER — SUCCINYLCHOLINE CHLORIDE 200 MG/10ML IV SOSY
PREFILLED_SYRINGE | INTRAVENOUS | Status: AC
  Filled 2017-08-30: qty 10

## 2017-08-30 MED ORDER — CEFAZOLIN SODIUM-DEXTROSE 2-4 GM/100ML-% IV SOLN
2.0000 g | INTRAVENOUS | Status: AC
  Administered 2017-08-30: 2 g via INTRAVENOUS
  Filled 2017-08-30: qty 100

## 2017-08-30 MED ORDER — SUGAMMADEX SODIUM 200 MG/2ML IV SOLN
INTRAVENOUS | Status: AC
  Filled 2017-08-30: qty 2

## 2017-08-30 MED ORDER — ROCURONIUM BROMIDE 10 MG/ML (PF) SYRINGE
PREFILLED_SYRINGE | INTRAVENOUS | Status: AC
  Filled 2017-08-30: qty 5

## 2017-08-30 MED ORDER — PHENYLEPHRINE 40 MCG/ML (10ML) SYRINGE FOR IV PUSH (FOR BLOOD PRESSURE SUPPORT)
PREFILLED_SYRINGE | INTRAVENOUS | Status: AC
  Filled 2017-08-30: qty 10

## 2017-08-30 MED ORDER — CHLORHEXIDINE GLUCONATE CLOTH 2 % EX PADS: 6.0000 | MEDICATED_PAD | Freq: Once | CUTANEOUS | Status: DC

## 2017-08-30 MED ORDER — BENAZEPRIL HCL 40 MG PO TABS
40.0000 mg | ORAL_TABLET | Freq: Every day | ORAL | Status: DC
  Administered 2017-08-30 – 2017-08-31 (×2): 40 mg via ORAL
  Filled 2017-08-30 (×2): qty 1

## 2017-08-30 MED ORDER — DEXAMETHASONE SODIUM PHOSPHATE 10 MG/ML IJ SOLN
INTRAMUSCULAR | Status: AC
  Filled 2017-08-30: qty 1

## 2017-08-30 MED ORDER — ONDANSETRON 4 MG PO TBDP: 4.0000 mg | ORAL_TABLET | Freq: Four times a day (QID) | ORAL | Status: DC | PRN

## 2017-08-30 MED ORDER — ACETAMINOPHEN 650 MG RE SUPP: 650.0000 mg | Freq: Four times a day (QID) | RECTAL | Status: DC | PRN

## 2017-08-30 MED ORDER — AMLODIPINE BESYLATE 5 MG PO TABS
5.0000 mg | ORAL_TABLET | Freq: Every day | ORAL | Status: DC
  Administered 2017-08-31: 5 mg via ORAL
  Filled 2017-08-30: qty 1

## 2017-08-30 MED ORDER — FUROSEMIDE 40 MG PO TABS
40.0000 mg | ORAL_TABLET | Freq: Two times a day (BID) | ORAL | Status: DC
  Administered 2017-08-30 – 2017-08-31 (×2): 40 mg via ORAL
  Filled 2017-08-30 (×2): qty 1

## 2017-08-30 MED ORDER — PROPOFOL 10 MG/ML IV BOLUS
INTRAVENOUS | Status: DC | PRN
  Administered 2017-08-30: 150 mg via INTRAVENOUS

## 2017-08-30 MED ORDER — MORPHINE SULFATE (PF) 4 MG/ML IV SOLN
2.0000 mg | INTRAVENOUS | Status: DC | PRN
Start: 2017-08-30 — End: 2017-08-31

## 2017-08-30 MED ORDER — AMIODARONE HCL 100 MG PO TABS
100.0000 mg | ORAL_TABLET | Freq: Every day | ORAL | Status: DC
  Administered 2017-08-31: 100 mg via ORAL
  Filled 2017-08-30: qty 1

## 2017-08-30 MED ORDER — NITROGLYCERIN 0.4 MG SL SUBL: 0.4000 mg | SUBLINGUAL_TABLET | SUBLINGUAL | Status: DC | PRN

## 2017-08-30 MED ORDER — LACTATED RINGERS IV SOLN
INTRAVENOUS | Status: DC
  Administered 2017-08-30: 08:00:00 via INTRAVENOUS

## 2017-08-30 MED ORDER — OXYCODONE HCL 5 MG PO TABS: 5.0000 mg | ORAL_TABLET | ORAL | Status: DC | PRN

## 2017-08-30 MED ORDER — WARFARIN SODIUM 2 MG PO TABS
2.0000 mg | ORAL_TABLET | ORAL | Status: DC
  Administered 2017-08-30: 2 mg via ORAL
  Filled 2017-08-30: qty 1

## 2017-08-30 MED ORDER — FENTANYL CITRATE (PF) 100 MCG/2ML IJ SOLN
50.0000 ug | Freq: Once | INTRAMUSCULAR | Status: AC
  Administered 2017-08-30: 50 ug via INTRAVENOUS

## 2017-08-30 MED ORDER — WARFARIN SODIUM 2 MG PO TABS: 2.0000 mg | ORAL_TABLET | ORAL | Status: DC

## 2017-08-30 MED ORDER — ENSURE PRE-SURGERY PO LIQD: 592.0000 mL | Freq: Once | ORAL | Status: DC

## 2017-08-30 MED ORDER — WARFARIN SODIUM 3 MG PO TABS
3.0000 mg | ORAL_TABLET | ORAL | Status: DC
  Filled 2017-08-30: qty 1

## 2017-08-30 MED ORDER — LIDOCAINE 2% (20 MG/ML) 5 ML SYRINGE
INTRAMUSCULAR | Status: DC | PRN
  Administered 2017-08-30: 20 mg via INTRAVENOUS

## 2017-08-30 MED ORDER — LEVOTHYROXINE SODIUM 50 MCG PO TABS
50.0000 ug | ORAL_TABLET | Freq: Every day | ORAL | Status: DC
  Administered 2017-08-31: 50 ug via ORAL
  Filled 2017-08-30: qty 1

## 2017-08-30 MED ORDER — FENTANYL CITRATE (PF) 250 MCG/5ML IJ SOLN
INTRAMUSCULAR | Status: AC
  Filled 2017-08-30: qty 5

## 2017-08-30 MED ORDER — FENTANYL CITRATE (PF) 100 MCG/2ML IJ SOLN: 25.0000 ug | INTRAMUSCULAR | Status: DC | PRN

## 2017-08-30 MED ORDER — SIMETHICONE 80 MG PO CHEW: 40.0000 mg | CHEWABLE_TABLET | Freq: Four times a day (QID) | ORAL | Status: DC | PRN

## 2017-08-30 MED ORDER — MORPHINE SULFATE (PF) 2 MG/ML IV SOLN: 2.0000 mg | INTRAVENOUS | Status: DC | PRN

## 2017-08-30 MED ORDER — EPHEDRINE SULFATE-NACL 50-0.9 MG/10ML-% IV SOSY
PREFILLED_SYRINGE | INTRAVENOUS | Status: DC | PRN
  Administered 2017-08-30 (×2): 5 mg via INTRAVENOUS

## 2017-08-30 MED ORDER — METHOCARBAMOL 500 MG PO TABS: 500.0000 mg | ORAL_TABLET | Freq: Four times a day (QID) | ORAL | Status: DC | PRN

## 2017-08-30 MED ORDER — BUPIVACAINE HCL (PF) 0.25 % IJ SOLN
INTRAMUSCULAR | Status: DC | PRN
  Administered 2017-08-30: 3 mL

## 2017-08-30 MED ORDER — ONDANSETRON HCL 4 MG/2ML IJ SOLN: 4.0000 mg | Freq: Once | INTRAMUSCULAR | Status: DC | PRN

## 2017-08-30 MED ORDER — ONDANSETRON HCL 4 MG/2ML IJ SOLN
INTRAMUSCULAR | Status: AC
  Filled 2017-08-30: qty 2

## 2017-08-30 MED ORDER — DOCUSATE SODIUM 100 MG PO CAPS
100.0000 mg | ORAL_CAPSULE | Freq: Every day | ORAL | Status: DC
  Administered 2017-08-30: 100 mg via ORAL
  Filled 2017-08-30: qty 1

## 2017-08-30 MED ORDER — FENTANYL CITRATE (PF) 100 MCG/2ML IJ SOLN
INTRAMUSCULAR | Status: AC
  Administered 2017-08-30: 50 ug via INTRAVENOUS
  Filled 2017-08-30: qty 2

## 2017-08-30 MED ORDER — LIDOCAINE 2% (20 MG/ML) 5 ML SYRINGE
INTRAMUSCULAR | Status: AC
  Filled 2017-08-30: qty 5

## 2017-08-30 MED ORDER — ACETAMINOPHEN 500 MG PO TABS
1000.0000 mg | ORAL_TABLET | ORAL | Status: AC
  Administered 2017-08-30: 1000 mg via ORAL
  Filled 2017-08-30: qty 2

## 2017-08-30 MED ORDER — PSYLLIUM 95 % PO PACK
1.0000 | PACK | Freq: Every day | ORAL | Status: DC
  Administered 2017-08-31: 1 via ORAL
  Filled 2017-08-30: qty 1

## 2017-08-30 MED ORDER — BUPIVACAINE-EPINEPHRINE (PF) 0.5% -1:200000 IJ SOLN
INTRAMUSCULAR | Status: DC | PRN
  Administered 2017-08-30: 30 mL

## 2017-08-30 MED ORDER — EPHEDRINE 5 MG/ML INJ
INTRAVENOUS | Status: AC
  Filled 2017-08-30: qty 10

## 2017-08-30 MED ORDER — DEXAMETHASONE SODIUM PHOSPHATE 10 MG/ML IJ SOLN
INTRAMUSCULAR | Status: DC | PRN
  Administered 2017-08-30: 5 mg via INTRAVENOUS

## 2017-08-30 MED ORDER — SUGAMMADEX SODIUM 200 MG/2ML IV SOLN
INTRAVENOUS | Status: DC | PRN
  Administered 2017-08-30: 150 mg via INTRAVENOUS

## 2017-08-30 MED ORDER — CARVEDILOL 3.125 MG PO TABS
3.1250 mg | ORAL_TABLET | Freq: Two times a day (BID) | ORAL | Status: DC
  Administered 2017-08-30 – 2017-08-31 (×2): 3.125 mg via ORAL
  Filled 2017-08-30 (×2): qty 1

## 2017-08-30 MED ORDER — BUPIVACAINE HCL (PF) 0.25 % IJ SOLN
INTRAMUSCULAR | Status: AC
  Filled 2017-08-30: qty 30

## 2017-08-30 MED ORDER — ENSURE PRE-SURGERY PO LIQD: 296.0000 mL | Freq: Once | ORAL | Status: DC

## 2017-08-30 MED ORDER — PHENYLEPHRINE 40 MCG/ML (10ML) SYRINGE FOR IV PUSH (FOR BLOOD PRESSURE SUPPORT)
PREFILLED_SYRINGE | INTRAVENOUS | Status: DC | PRN
  Administered 2017-08-30 (×3): 120 ug via INTRAVENOUS
  Administered 2017-08-30: 40 ug via INTRAVENOUS
  Administered 2017-08-30: 120 ug via INTRAVENOUS

## 2017-08-30 MED ORDER — PROPOFOL 10 MG/ML IV BOLUS
INTRAVENOUS | Status: AC
  Filled 2017-08-30: qty 20

## 2017-08-30 MED ORDER — WARFARIN - PHYSICIAN DOSING INPATIENT
Freq: Every day | Status: DC
  Administered 2017-08-30: 18:00:00

## 2017-08-30 MED ORDER — PRAVASTATIN SODIUM 40 MG PO TABS
40.0000 mg | ORAL_TABLET | Freq: Every evening | ORAL | Status: DC
  Administered 2017-08-30: 40 mg via ORAL
  Filled 2017-08-30: qty 1

## 2017-08-30 MED ORDER — PANTOPRAZOLE SODIUM 40 MG PO TBEC
40.0000 mg | DELAYED_RELEASE_TABLET | Freq: Every day | ORAL | Status: DC
  Administered 2017-08-31: 40 mg via ORAL
  Filled 2017-08-30: qty 1

## 2017-08-30 MED ORDER — ACETAMINOPHEN 325 MG PO TABS: 650.0000 mg | ORAL_TABLET | Freq: Four times a day (QID) | ORAL | Status: DC | PRN

## 2017-08-30 MED ORDER — BUPIVACAINE-EPINEPHRINE (PF) 0.5% -1:200000 IJ SOLN
INTRAMUSCULAR | Status: AC
  Filled 2017-08-30: qty 30

## 2017-08-30 MED ORDER — ROCURONIUM BROMIDE 10 MG/ML (PF) SYRINGE
PREFILLED_SYRINGE | INTRAVENOUS | Status: DC | PRN
  Administered 2017-08-30: 40 mg via INTRAVENOUS

## 2017-08-30 MED ORDER — GABAPENTIN 300 MG PO CAPS
300.0000 mg | ORAL_CAPSULE | ORAL | Status: AC
  Administered 2017-08-30: 300 mg via ORAL
  Filled 2017-08-30: qty 1

## 2017-08-30 SURGICAL SUPPLY — 53 items
ADH SKN CLS APL DERMABOND .7 (GAUZE/BANDAGES/DRESSINGS) ×1
BLADE CLIPPER SURG (BLADE) ×2 IMPLANT
BLADE SURG 10 STRL SS (BLADE) ×3 IMPLANT
BLADE SURG 15 STRL LF DISP TIS (BLADE) ×1 IMPLANT
BLADE SURG 15 STRL SS (BLADE) ×3
CANISTER SUCT 3000ML PPV (MISCELLANEOUS) ×2 IMPLANT
CHLORAPREP W/TINT 26ML (MISCELLANEOUS) ×3 IMPLANT
CLOSURE WOUND 1/2 X4 (GAUZE/BANDAGES/DRESSINGS) ×1
COVER SURGICAL LIGHT HANDLE (MISCELLANEOUS) ×3 IMPLANT
DECANTER SPIKE VIAL GLASS SM (MISCELLANEOUS) ×2 IMPLANT
DERMABOND ADVANCED (GAUZE/BANDAGES/DRESSINGS) ×2
DERMABOND ADVANCED .7 DNX12 (GAUZE/BANDAGES/DRESSINGS) ×1 IMPLANT
DRAIN PENROSE 1/2X12 LTX STRL (WOUND CARE) ×2 IMPLANT
DRAPE LAPAROTOMY TRNSV 102X78 (DRAPE) ×3 IMPLANT
DRAPE UTILITY XL STRL (DRAPES) ×3 IMPLANT
ELECT CAUTERY BLADE 6.4 (BLADE) ×3 IMPLANT
ELECT REM PT RETURN 9FT ADLT (ELECTROSURGICAL) ×3
ELECTRODE REM PT RTRN 9FT ADLT (ELECTROSURGICAL) ×1 IMPLANT
GAUZE SPONGE 4X4 16PLY XRAY LF (GAUZE/BANDAGES/DRESSINGS) ×3 IMPLANT
GLOVE BIO SURGEON STRL SZ7 (GLOVE) ×5 IMPLANT
GLOVE BIOGEL PI IND STRL 7.0 (GLOVE) IMPLANT
GLOVE BIOGEL PI IND STRL 7.5 (GLOVE) ×1 IMPLANT
GLOVE BIOGEL PI INDICATOR 7.0 (GLOVE) ×2
GLOVE BIOGEL PI INDICATOR 7.5 (GLOVE) ×2
GLOVE SURG SS PI 6.5 STRL IVOR (GLOVE) ×2 IMPLANT
GOWN STRL REUS W/ TWL LRG LVL3 (GOWN DISPOSABLE) ×2 IMPLANT
GOWN STRL REUS W/TWL LRG LVL3 (GOWN DISPOSABLE) ×6
KIT BASIN OR (CUSTOM PROCEDURE TRAY) ×3 IMPLANT
KIT TURNOVER KIT B (KITS) ×2 IMPLANT
MARKER SKIN DUAL TIP RULER LAB (MISCELLANEOUS) ×3 IMPLANT
MESH ULTRAPRO 3X6 7.6X15CM (Mesh General) ×2 IMPLANT
NDL HYPO 25GX1X1/2 BEV (NEEDLE) ×1 IMPLANT
NEEDLE HYPO 25GX1X1/2 BEV (NEEDLE) ×3 IMPLANT
NS IRRIG 1000ML POUR BTL (IV SOLUTION) ×3 IMPLANT
PACK SURGICAL SETUP 50X90 (CUSTOM PROCEDURE TRAY) ×3 IMPLANT
PAD ARMBOARD 7.5X6 YLW CONV (MISCELLANEOUS) ×3 IMPLANT
PENCIL BUTTON HOLSTER BLD 10FT (ELECTRODE) ×3 IMPLANT
SPONGE LAP 18X18 X RAY DECT (DISPOSABLE) ×3 IMPLANT
STRIP CLOSURE SKIN 1/2X4 (GAUZE/BANDAGES/DRESSINGS) ×1 IMPLANT
SUT MNCRL AB 4-0 PS2 18 (SUTURE) ×3 IMPLANT
SUT VIC AB 0 CT1 18XCR BRD 8 (SUTURE) ×1 IMPLANT
SUT VIC AB 0 CT1 8-18 (SUTURE) ×3
SUT VIC AB 2-0 CT1 27 (SUTURE) ×3
SUT VIC AB 2-0 CT1 TAPERPNT 27 (SUTURE) ×1 IMPLANT
SUT VIC AB 2-0 SH 18 (SUTURE) ×5 IMPLANT
SUT VIC AB 3-0 SH 27 (SUTURE) ×3
SUT VIC AB 3-0 SH 27XBRD (SUTURE) ×1 IMPLANT
SUT VICRYL AB 2 0 TIES (SUTURE) ×3 IMPLANT
SYR CONTROL 10ML LL (SYRINGE) ×3 IMPLANT
TOWEL GREEN STERILE (TOWEL DISPOSABLE) ×2 IMPLANT
TUBE CONNECTING 12'X1/4 (SUCTIONS) ×1
TUBE CONNECTING 12X1/4 (SUCTIONS) ×1 IMPLANT
YANKAUER SUCT BULB TIP NO VENT (SUCTIONS) ×2 IMPLANT

## 2017-08-30 NOTE — Progress Notes (Signed)
Doctor's office called requesting cpap orders at bedtime.

## 2017-08-30 NOTE — Anesthesia Preprocedure Evaluation (Addendum)
Anesthesia Evaluation  Patient identified by MRN, date of birth, ID band Patient awake    Reviewed: Allergy & Precautions, NPO status , Patient's Chart, lab work & pertinent test results, reviewed documented beta blocker date and time   Airway Mallampati: II  TM Distance: >3 FB Neck ROM: Full    Dental  (+) Dental Advisory Given   Pulmonary sleep apnea , former smoker,    breath sounds clear to auscultation       Cardiovascular hypertension, Pt. on medications and Pt. on home beta blockers + CAD, + CABG, + Peripheral Vascular Disease and +CHF  + dysrhythmias Atrial Fibrillation + pacemaker + Valvular Problems/Murmurs (s/p AVR and MV repair. normal EF on 12/18 echo. Valve gradients wnl.)  Rhythm:Regular Rate:Normal     Neuro/Psych negative neurological ROS     GI/Hepatic Neg liver ROS, hiatal hernia, GERD  ,  Endo/Other  Hypothyroidism   Renal/GU negative Renal ROS     Musculoskeletal  (+) Arthritis ,   Abdominal   Peds  Hematology  (+) anemia ,   Anesthesia Other Findings   Reproductive/Obstetrics                             Lab Results  Component Value Date   WBC 5.1 08/24/2017   HGB 10.3 (L) 08/24/2017   HCT 32.9 (L) 08/24/2017   MCV 86.1 08/24/2017   PLT 115 (L) 08/24/2017   Lab Results  Component Value Date   CREATININE 0.93 08/24/2017   BUN 13 08/24/2017   NA 138 08/24/2017   K 4.0 08/24/2017   CL 103 08/24/2017   CO2 25 08/24/2017    Anesthesia Physical Anesthesia Plan  ASA: III  Anesthesia Plan: General   Post-op Pain Management:    Induction: Intravenous  PONV Risk Score and Plan: 3 and Dexamethasone, Ondansetron and Treatment may vary due to age or medical condition  Airway Management Planned: Oral ETT  Additional Equipment:   Intra-op Plan:   Post-operative Plan: Extubation in OR  Informed Consent: I have reviewed the patients History and Physical,  chart, labs and discussed the procedure including the risks, benefits and alternatives for the proposed anesthesia with the patient or authorized representative who has indicated his/her understanding and acceptance.   Dental advisory given  Plan Discussed with: CRNA  Anesthesia Plan Comments:         Anesthesia Quick Evaluation

## 2017-08-30 NOTE — Anesthesia Procedure Notes (Signed)
Anesthesia Regional Block: TAP block   Pre-Anesthetic Checklist: ,, timeout performed, Correct Patient, Correct Site, Correct Laterality, Correct Procedure, Correct Position, site marked, Risks and benefits discussed,  Surgical consent,  Pre-op evaluation,  At surgeon's request and post-op pain management  Laterality: Left  Prep: chloraprep       Needles:  Injection technique: Single-shot  Needle Type: Echogenic Needle     Needle Length: 9cm  Needle Gauge: 21     Additional Needles:   Procedures:,,,, ultrasound used (permanent image in chart),,,,  Narrative:  Start time: 08/30/2017 8:30 AM End time: 08/30/2017 8:37 AM Injection made incrementally with aspirations every 5 mL.  Performed by: Personally  Anesthesiologist: Suzette Battiest, MD

## 2017-08-30 NOTE — Anesthesia Postprocedure Evaluation (Signed)
Anesthesia Post Note  Patient: CHARLEY MISKE  Procedure(s) Performed: LEFT INGUINAL HERNIA REPAIR (Left Inguinal) INSERTION OF MESH (Left Inguinal)     Patient location during evaluation: PACU Anesthesia Type: General Level of consciousness: awake and alert Pain management: pain level controlled Vital Signs Assessment: post-procedure vital signs reviewed and stable Respiratory status: spontaneous breathing, nonlabored ventilation, respiratory function stable and patient connected to nasal cannula oxygen Cardiovascular status: blood pressure returned to baseline and stable Postop Assessment: no apparent nausea or vomiting Anesthetic complications: no    Last Vitals:  Vitals:   08/30/17 1128 08/30/17 1152  BP: 131/67 131/64  Pulse:  63  Resp:  16  Temp: 36.5 C (!) 36.4 C  SpO2:  94%    Last Pain:  Vitals:   08/30/17 1152  TempSrc: Oral                 Tiajuana Amass

## 2017-08-30 NOTE — Anesthesia Procedure Notes (Signed)
Procedure Name: Intubation Date/Time: 08/30/2017 9:36 AM Performed by: Julieta Bellini, CRNA Pre-anesthesia Checklist: Patient identified, Emergency Drugs available, Suction available, Patient being monitored and Timeout performed Patient Re-evaluated:Patient Re-evaluated prior to induction Oxygen Delivery Method: Circle system utilized Preoxygenation: Pre-oxygenation with 100% oxygen Induction Type: IV induction Ventilation: Mask ventilation without difficulty Laryngoscope Size: Miller and 2 Grade View: Grade II Tube type: Oral Tube size: 7.5 mm Number of attempts: 1 Airway Equipment and Method: Patient positioned with wedge pillow and Stylet Placement Confirmation: ETT inserted through vocal cords under direct vision,  positive ETCO2 and CO2 detector Secured at: 23 cm Tube secured with: Tape Dental Injury: Teeth and Oropharynx as per pre-operative assessment

## 2017-08-30 NOTE — Interval H&P Note (Signed)
**Note Riley-Identified via Obfuscation** History and Physical Interval Note:  08/30/2017 8:59 AM  [name]  has presented today for surgery, with the diagnosis of LEFT INGUINAL HERNIA  The various methods of treatment have been discussed with the patient and family. After consideration of risks, benefits and other options for treatment, the patient has consented to  Procedure(s) with comments: LEFT INGUINAL HERNIA REPAIR WITH MESH (Left) - GENERAL ETT AND TAP BLOCK INSERTION OF MESH (Left) - GENERAL ETT AND TAP BLOCK as a surgical intervention .  The patient's history has been reviewed, patient examined, no change in status, stable for surgery.  I have reviewed the patient's chart and labs.  Questions were answered to the patient's satisfaction.     Rolm Bookbinder

## 2017-08-30 NOTE — H&P (Signed)
46 yom referred by dr Irine Seal for bilateral inguinal hernias. he has multiple comorbidities. he was just discharged from hospital on 11 december. he had mulitple issues including chf, knee issues. he was sent home doing fair. he is slowly improving. he had urinary retention in the hospital and has a foley in place now. he has history of some sort of ih repair at age 84 not sure of what was done or what side. shortly after that he noted recurrence and a large left sided hernia. this has been getting bigger and over last three months the left sided hernia has now led to penis being retracted with swelling and he has issues with urination leading him to leak and urinate in a diaper/pad now.    Past Surgical History  Coronary Artery Bypass Graft  Hip Surgery  Left. Knee Surgery  Bilateral. Tonsillectomy  Valve Replacement   Diagnostic Studies History Colonoscopy  within last year  Allergies  No Known Drug Allergies   Medication History Amiodarone HCl (200MG  Tablet, Oral) Active. AmLODIPine Besylate (5MG  Tablet, Oral) Active. Benazepril HCl (40MG  Tablet, Oral) Active. Carvedilol (3.125MG  Tablet, Oral) Active. Cefuroxime Axetil (500MG  Tablet, Oral) Active. Furosemide (40MG  Tablet, Oral) Active. Klor-Con M20 [facility] Tablet ER, Oral) Active. Levothyroxine Sodium (50MCG Tablet, Oral) Active. Pantoprazole Sodium (40MG  Tablet DR, Oral) Active. Pravastatin Sodium (40MG  Tablet, Oral) Active. Warfarin Sodium (2MG  Tablet, Oral) Active. Medications Reconciled  Social History  Alcohol use  Occasional alcohol use. Caffeine use  Carbonated beverages. Tobacco use  Former smoker.  Family History  Colon Cancer  Mother.  Other Problems Arthritis  Atrial Fibrillation  Back Pain  Bladder Problems  Congestive Heart Failure  Enlarged Prostate  Heart murmur  High blood pressure  Hypercholesterolemia  Sleep Apnea   Review of Systems General Present-  Fatigue. Not Present- Appetite Loss, Chills, Fever, Night Sweats, Weight Gain and Weight Loss. Skin Present- Dryness. Not Present- Change in Wart/Mole, Hives, Jaundice, New Lesions, Non-Healing Wounds, Rash and Ulcer. HEENT Not Present- Earache, Hearing Loss, Hoarseness, Nose Bleed, Oral Ulcers, Ringing in the Ears, Seasonal Allergies, Sinus Pain, Sore Throat, Visual Disturbances, Wears glasses/contact lenses and Yellow Eyes. Breast Not Present- Breast Mass, Breast Pain, Nipple Discharge and Skin Changes. Cardiovascular Not Present- Chest Pain, Difficulty Breathing Lying Down, Leg Cramps, Palpitations, Rapid Heart Rate, Shortness of Breath and Swelling of Extremities. Gastrointestinal Present- Constipation. Not Present- Abdominal Pain, Bloating, Bloody Stool, Change in Bowel Habits, Chronic diarrhea, Difficulty Swallowing, Excessive gas, Gets full quickly at meals, Hemorrhoids, Indigestion, Nausea, Rectal Pain and Vomiting. Male Genitourinary Present- Change in Urinary Stream, Frequency, Impotence, Nocturia, Urgency and Urine Leakage. Not Present- Blood in Urine and Painful Urination. Musculoskeletal Present- Joint Pain, Joint Stiffness, Muscle Pain, Muscle Weakness and Swelling of Extremities. Not Present- Back Pain. Neurological Present- Trouble walking. Not Present- Decreased Memory, Fainting, Headaches, Numbness, Seizures, Tingling, Tremor and Weakness. Psychiatric Not Present- Anxiety, Bipolar, Change in Sleep Pattern, Depression, Fearful and Frequent crying. Endocrine Present- Cold Intolerance. Not Present- Excessive Hunger, Hair Changes, Heat Intolerance, Hot flashes and New Diabetes. Hematology Present- Blood Thinners and Easy Bruising. Not Present- Excessive bleeding, Gland problems, HIV and Persistent Infections.  Vitals  Weight: 172.4 lb Height: 67in Body Surface Area: 1.9 m Body Mass Index: 27 kg/m  Pulse: 60 (Regular)  BP: 102/64 (Sitting, Left Arm, Standard) Physical  Exam Abdomen Note: moderately distended, no rih I can palpate, he has scrotal lih that is nontender, penis retracted with foley in place cv rrr Lungs clear  Assessment &  Plan INCARCERATED LEFT INGUINAL HERNIA (K40.30) We discussed observation versus repair.  We discussed both laparoscopic and open inguinal hernia repairs. I described the procedure in detail.  The patient was given educational material.  Goals should be achieved with surgery. We discussed the usage of mesh and the rationale behind that. We went over the pathophysiology of inguinal hernia. We have elected to perform open inguinal hernia repair with mesh.  We discussed the risks including bleeding, infection, recurrence, postoperative pain and chronic groin pain, testicular injury, urinary retention, numbness in groin and around incision. Will leave foley in postop and will stay overnight

## 2017-08-30 NOTE — Op Note (Signed)
Preoperative diagnosis: left incarcerated inguinal hernia Postoperative diagnosis: incarcerated left indirect inguinal hernia Procedure: Left inguinal hernia repair with ultrapro mesh Surgeon: Dr Serita Grammes EBL: minimal Anesthesia: general with tap block Specimens none Drains none Complications none Sponge and needle count correct times two dispo to recovery stable  Indications: This is a 73 yom with multiple medical issues who presented with a foley in place and a scrotal left inguinal hernia. He has a ct confirming that as well as a possible right groin hernia. I cannot clinically identify a right inguinal hernia. We discussed open repair with mesh of his left sided hernia.    Procedure: After informed consent obtained, patient was taken to the operating room. He had undergone a TAP block. He was given antibiotics. He had SCDs in place. He was placed under general anesthesia. He was prepped and draped in the standard sterile surgical fashion. A timeout was performed.   I infiltrated marcaine in the skin. I made a left groin incision and carried this down to the external oblique.I opened the the external oblique and encircled the spermatic cord. He had a large scrotalindirect hernia. He also had a very weak floor with attenuated fascia. The omentum was present in the indirect hernia. This would not reduce. I clamped it at base and divided it. I oversewed it with 2-0 vicryl suture. I then closed the internal ring with 2-0 vicryl suture. I elected toplace an ultrapro patch over the floor. I made a t cut and wrapped this around the spermatic cord. I sutured this to the pubic tubercle with 2-0 vicryl and every half cm to the inguinal ligament. I then sutured the t cut together. The mesh laid flat and was in good position. I laid the lateral portion flat under the external oblique. It completely obliterated the defect. Hemostasis was observed. I then closed the external  oblique with 2-0 vicryl and Scarpas fascia with 3-0 vicryl The skin was closed with 4-0 monocryl and dermabond. He tolerated well and was transferred to recovery stable.

## 2017-08-30 NOTE — Transfer of Care (Signed)
Immediate Anesthesia Transfer of Care Note  Patient: [name]  Procedure(s) Performed: LEFT INGUINAL HERNIA REPAIR (Left Inguinal) INSERTION OF MESH (Left Inguinal)  Patient Location: PACU  Anesthesia Type:GA combined with regional for post-op pain  Level of Consciousness: awake, alert  and oriented  Airway & Oxygen Therapy: Patient Spontanous Breathing and Patient connected to nasal cannula oxygen  Post-op Assessment: Report given to RN and Post -op Vital signs reviewed and stable  Post vital signs: Reviewed  Last Vitals:  Vitals:   08/30/17 0835 08/30/17 1043  BP: (!) 124/59 129/70  Pulse: 62 (!) 59  Resp: 12 15  Temp:    SpO2: 94% 96%    Last Pain:  Vitals:   08/30/17 0721  TempSrc: Oral      Patients Stated Pain Goal: 3 (21/11/73 5670)  Complications: No apparent anesthesia complications

## 2017-08-31 ENCOUNTER — Encounter (HOSPITAL_COMMUNITY): Payer: Self-pay | Admitting: General Surgery

## 2017-08-31 DIAGNOSIS — K4030 Unilateral inguinal hernia, with obstruction, without gangrene, not specified as recurrent: Secondary | ICD-10-CM | POA: Diagnosis not present

## 2017-08-31 LAB — BASIC METABOLIC PANEL
Anion gap: 12 (ref 5–15)
BUN: 13 mg/dL (ref 6–20)
CO2: 25 mmol/L (ref 22–32)
Calcium: 8.9 mg/dL (ref 8.9–10.3)
Chloride: 103 mmol/L (ref 101–111)
Creatinine, Ser: 1 mg/dL (ref 0.61–1.24)
GFR calc Af Amer: >60 mL/min (ref >60)
GFR calc non Af Amer: >60 mL/min (ref >60)
Glucose, Bld: 115 mg/dL — ABNORMAL HIGH (ref 65–99)
Potassium: 3.3 mmol/L — ABNORMAL LOW (ref 3.5–5.1)
Sodium: 140 mmol/L (ref 135–145)

## 2017-08-31 LAB — PROTIME-INR
INR: 1.44
Prothrombin Time: 17.4 seconds — ABNORMAL HIGH (ref 11.4–15.2)

## 2017-08-31 MED ORDER — POTASSIUM CHLORIDE 20 MEQ PO PACK
20.0000 meq | PACK | Freq: Once | ORAL | Status: AC
  Administered 2017-08-31: 20 meq via ORAL
  Filled 2017-08-31: qty 1

## 2017-08-31 MED ORDER — TRAMADOL HCL 50 MG PO TABS: 50.0000 mg | ORAL_TABLET | Freq: Four times a day (QID) | ORAL | 0 refills | Status: DC | PRN

## 2017-08-31 NOTE — Discharge Instructions (Signed)
[facility] ° °UMBILICAL OR INGUINAL HERNIA REPAIR: POST OP INSTRUCTIONS ° °Always review your discharge instruction sheet given to you by the facility where your surgery was performed. °IF YOU HAVE DISABILITY OR FAMILY LEAVE FORMS, YOU MUST BRING THEM TO THE OFFICE FOR PROCESSING.   °DO NOT GIVE THEM TO YOUR DOCTOR. ° °1. A  prescription for pain medication may be given to you upon discharge.  Take your pain medication as prescribed, if needed.  If narcotic pain medicine is not needed, then you may take acetaminophen (Tylenol), naprosyn (Alleve) or ibuprofen (Advil) as needed. °2. Take your usually prescribed medications unless otherwise directed. °3. If you need a refill on your pain medication, please contact your pharmacy.  They will contact our office to request authorization. Prescriptions will not be filled after 5 pm or on week-ends. °4. You should follow a light diet the first 24 hours after arrival home, such as soup and crackers, etc.  Be sure to include lots of fluids daily.  Resume your normal diet the day after surgery. °5. Most patients will experience some swelling and bruising around the umbilicus or in the groin and scrotum.  Ice packs and reclining will help.  Swelling and bruising can take several days to resolve.  °6. It is common to experience some constipation if taking pain medication after surgery.  Increasing fluid intake and taking a stool softener (such as Colace) will usually help or prevent this problem from occurring.  A mild laxative (Milk of Magnesia or Miralax) should be taken according to package directions if there are no bowel movements after 48 hours. °7. Unless discharge instructions indicate otherwise, you may remove your bandages 48 hours after surgery, and you may shower at that time.  You may have steri-strips (small skin tapes) in place directly over the incision.  These strips should be left on the skin for 7-10 days and will come off on their own.   If your surgeon used skin glue on the incision, you may shower in 24 hours.  The glue will flake off over the next 2-3 weeks.  Any sutures or staples will be removed at the office during your follow-up visit. °8. ACTIVITIES:  You may resume regular (light) daily activities beginning the next day--such as daily self-care, walking, climbing stairs--gradually increasing activities as tolerated.  You may have sexual intercourse when it is comfortable.  Refrain from any heavy lifting or straining until approved by your doctor. °a. You may drive when you are no longer taking prescription pain medication, you can comfortably wear a seatbelt, and you can safely maneuver your car and apply brakes. °b. RETURN TO WORK:  __________________________________________________________ °9. You should see your doctor in the office for a follow-up appointment approximately 2-3 weeks after your surgery.  Make sure that you call for this appointment within a day or two after you arrive home to insure a convenient appointment time. °10. OTHER INSTRUCTIONS:  __________________________________________________________________________________________________________________________________________________________________________________________  °WHEN TO CALL YOUR DOCTOR: °1. Fever over 101.0 °2. Inability to urinate °3. Nausea and/or vomiting °4. Extreme swelling or bruising °5. Continued bleeding from incision. °6. Increased pain, redness, or drainage from the incision ° °The clinic staff is available to answer your questions during regular business hours.  Please don’t hesitate to call and ask to speak to one of the nurses for clinical concerns.  If you have a medical emergency, go to the nearest emergency room or call 911.  A surgeon from [facility]   is always on call at the hospital ° ° °[address], [address], [place], [place]  [address] ? ° [address] [address], [place], [place]   [address] °[phone] ? [phone] ? FAX  [phone] °Web site: [email or web address] ° ° °

## 2017-08-31 NOTE — Progress Notes (Signed)
1 Day Post-Op   Subjective/Chief Complaint: No complaints, doing well, tol liquids, minimal pain   Objective: Vital signs in last 24 hours: Temp:  [97.5 F (36.4 C)-98.4 F (36.9 C)] 97.5 F (36.4 C) (02/05 0458) Pulse Rate:  [59-82] 82 (02/05 0458) Resp:  [12-22] 16 (02/05 0458) BP: (118-143)/(56-75) 125/66 (02/05 0458) SpO2:  [90 %-97 %] 97 % (02/05 0458) Weight:  [77.6 kg (171 lb)] 77.6 kg (171 lb) (02/04 0746) Last BM Date: 08/29/17  Intake/Output from previous day: 02/04 0701 - 02/05 0700 In: 1040 [P.O.:240; I.V.:800] Out: 1700 [Urine:1720; Blood:10] Intake/Output this shift: Total I/O In: 240 [P.O.:240] Out: 1000 [Urine:1000]  GI: soft nt/nd incision clean  Lab Results:  No results for input(s): WBC, HGB, HCT, PLT in the last 72 hours. BMET No results for input(s): NA, K, CL, CO2, GLUCOSE, BUN, CREATININE, CALCIUM in the last 72 hours. PT/INR Recent Labs    08/30/17 0804  LABPROT 17.0*  INR 1.39   ABG No results for input(s): PHART, HCO3 in the last 72 hours.  Invalid input(s): PCO2, PO2  Studies/Results: No results found.  Anti-infectives: Anti-infectives (From admission, onward)   Start     Dose/Rate Route Frequency Ordered Stop   08/30/17 0719  ceFAZolin (ANCEF) IVPB 2g/100 mL premix     2 g 200 mL/hr over 30 Minutes Intravenous On call to O.R. 08/30/17 0719 08/30/17 0953      Assessment/Plan: POD 1 lih repair  Home today if cleared by pt Continue foley until urology follow up Restart lovenox and coumadin   Rolm Bookbinder 08/31/2017

## 2017-08-31 NOTE — Progress Notes (Signed)
Discharge instructions (including medications) discussed with and copy provided to patient/caregiver 

## 2017-08-31 NOTE — Evaluation (Signed)
Physical Therapy Evaluation Patient Details Name: [name] MRN: [id number] DOB: Apr 27, 1929 Today's Date: 08/31/2017   History of Present Illness  84yo male who received surgery for L inguinal hernia repair with mesh on 08/30/17. PMH hx CABG, hx hip surgery, hx B knee surgery   Clinical Impression   Patient received in bed, pleasant and willing to participate in skilled PT evaluation this morning. He does require MinA for rolling and sidelying to sit as well as cues for importance of correct mechanics following abdominal surgery, however patient and wife report he sleeps in lift chair at home. He requires MinA for light boost with sit to stand, also MinA for steadying initially with RW, but then was able to ambulate approximately 59ft within the room with RW, Min guard with gait deviations as noted above which patient/wife report are his general baseline as well. He was left up in the chair with his wife and RN presenting/attending. At this point and per his report, patient generally seems close to his baseline level of mobility but may benefit from skilled HHPT services moving forward.     Follow Up Recommendations Home health PT    Equipment Recommendations  Rolling walker with 5" wheels    Recommendations for Other Services       Precautions / Restrictions Precautions Precautions: Fall;Other (comment) Precaution Comments: abdominal incision  Restrictions Weight Bearing Restrictions: No      Mobility  Bed Mobility Overal bed mobility: Needs Assistance Bed Mobility: Rolling;Sidelying to Sit Rolling: Min assist Sidelying to sit: Min assist       General bed mobility comments: MinA to facilitate correct movement pattern, safety   Transfers Overall transfer level: Needs assistance Equipment used: Rolling walker (2 wheeled) Transfers: Sit to/from Stand Sit to Stand: Min assist         General transfer comment: MinA for light boost to standing position, MinA to initially  steady within RW due to posterior unsteadiness   Ambulation/Gait Ambulation/Gait assistance: Min guard Ambulation Distance (Feet): 10 Feet Assistive device: Rolling walker (2 wheeled) Gait Pattern/deviations: Step-through pattern;Decreased step length - right;Decreased stance time - left;Decreased dorsiflexion - left;Decreased weight shift to left;Trunk flexed     General Gait Details: patient at baseline has leg length discrepancy due to past surgeries and knee pain/problems; reduce stance time/only touches floor with toes L LE, knee flexion during gait L LE   Stairs            Wheelchair Mobility    Modified Rankin (Stroke Patients Only)       Balance Overall balance assessment: Needs assistance Sitting-balance support: Bilateral upper extremity supported;Feet supported Sitting balance-Leahy Scale: Good     Standing balance support: Bilateral upper extremity supported;During functional activity Standing balance-Leahy Scale: Poor Standing balance comment: reliant on B UE support, posterior unsteadiness initially with standing                              Pertinent Vitals/Pain Pain Assessment: No/denies pain    Home Living Family/patient expects to be discharged to:: Private residence Living Arrangements: Spouse/significant other Available Help at Discharge: Family;Available 24 hours/day Type of Home: House Home Access: Level entry     Home Layout: One level Home Equipment: Grab bars - toilet;Grab bars - tub/shower;Walker - 4 wheels;Walker - 2 wheels;Transport chair      Prior Function Level of Independence: Independent with assistive device(s)         Comments:  Use rollator for ambulation. Uses transport chair for outdoor      Hand Dominance        Extremity/Trunk Assessment        Lower Extremity Assessment Lower Extremity Assessment: Generalized weakness    Cervical / Trunk Assessment Cervical / Trunk Assessment: Kyphotic   Communication   Communication: No difficulties  Cognition Arousal/Alertness: Awake/alert Behavior During Therapy: WFL for tasks assessed/performed Overall Cognitive Status: Within Functional Limits for tasks assessed                                        General Comments General comments (skin integrity, edema, etc.): per patient and his wife's report, he generally seems close to baseline with gait and functional mobility     Exercises     Assessment/Plan    PT Assessment Patient needs continued PT services  PT Problem List Decreased strength;Decreased mobility;Decreased safety awareness;Decreased coordination;Decreased knowledge of use of DME;Decreased balance       PT Treatment Interventions DME instruction;Therapeutic activities;Gait training;Therapeutic exercise;Patient/family education;Stair training;Balance training;Functional mobility training;Neuromuscular re-education    PT Goals (Current goals can be found in the Care Plan section)  Acute Rehab PT Goals Patient Stated Goal: to go home  PT Goal Formulation: With patient Time For Goal Achievement: 09/07/17 Potential to Achieve Goals: Good    Frequency Min 4X/week   Barriers to discharge        Co-evaluation               AM-PAC PT "6 Clicks" Daily Activity  Outcome Measure Difficulty turning over in bed (including adjusting bedclothes, sheets and blankets)?: Unable Difficulty moving from lying on back to sitting on the side of the bed? : Unable Difficulty sitting down on and standing up from a chair with arms (e.g., wheelchair, bedside commode, etc,.)?: Unable Help needed moving to and from a bed to chair (including a wheelchair)?: A Little Help needed walking in hospital room?: A Little Help needed climbing 3-5 steps with a railing? : A Lot 6 Click Score: 11    End of Session Equipment Utilized During Treatment: Gait belt Activity Tolerance: Patient tolerated treatment well Patient  left: in chair;with call bell/phone within reach;with family/visitor present;with nursing/sitter in room   PT Visit Diagnosis: Unsteadiness on feet (R26.81);Muscle weakness (generalized) (M62.81);Difficulty in walking, not elsewhere classified (R26.2)    Time: 2979-8921 PT Time Calculation (min) (ACUTE ONLY): 26 min   Charges:   PT Evaluation $PT Eval Low Complexity: 1 Low PT Treatments $Therapeutic Activity: 8-22 mins   PT G Codes:        Deniece Ree PT, DPT, CBIS  Supplemental Physical Therapist [place]   Pager [phone]

## 2017-08-31 NOTE — Progress Notes (Signed)
Urinary drainage bag changed to leg drainage bag from patient's supplies from home to get ready for discharge.

## 2017-08-31 NOTE — Care Management Obs Status (Signed)
[place] NOTIFICATION   Patient Details  Name: SHAMON COTHRAN MRN: [id number] Date of Birth: 1929-03-29   Medicare Observation Status Notification Given:  Yes    Marilu Favre, RN 08/31/2017, 11:29 AM

## 2017-08-31 NOTE — Care Management Note (Addendum)
Case Management Note  Patient Details  Name: [name] MRN: [id number] Date of Birth: March 04, 1929  Subjective/Objective:                    Action/Plan: Home Health orders and face to face signed. Faxed referral to Bryan at [facility] .  Spoke with patient and wife at bedside. They would like [facility] [phone] fax [phone] for HHPT. Called same spoke with Vaughan Basta who accepted referral, once MD has signed order and face to face ( spoke with Hadelyn at [facility] office)  will fax.  Patient already has walker and wheelchair at home Expected Discharge Date:                  Expected Discharge Plan:  [place]  In-House Referral:     Discharge planning Services  CM Consult  Post Acute Care Choice:  Home Health Choice offered to:  Spouse, Patient  DME Arranged:  N/A DME Agency:  NA  HH Arranged:  PT HH Agency:  [facility]  Status of Service:  Completed, signed off  If discussed at [place] of Stay Meetings, dates discussed:    Additional Comments:  Marilu Favre, RN 08/31/2017, 10:58 AM

## 2017-09-01 ENCOUNTER — Telehealth: Payer: Self-pay | Admitting: Cardiology

## 2017-09-01 ENCOUNTER — Ambulatory Visit (INDEPENDENT_AMBULATORY_CARE_PROVIDER_SITE_OTHER): Payer: Medicare Other | Admitting: *Deleted

## 2017-09-01 DIAGNOSIS — I495 Sick sinus syndrome: Secondary | ICD-10-CM | POA: Diagnosis not present

## 2017-09-01 NOTE — Telephone Encounter (Signed)
Confirmed remote transmission w/ pt wife.   

## 2017-09-01 NOTE — Progress Notes (Signed)
Remote pacemaker transmission.   

## 2017-09-01 NOTE — Discharge Summary (Signed)
Physician Discharge Summary  Patient ID: [name] MRN: [id number] DOB/AGE: 04-11-1929 84 y.o.  Admit date: 08/30/2017 Discharge date: 09/01/2017  Admission Diagnoses: Incarcerated LIH afib osa gerd Cad  Discharge Diagnoses:  Active Problems:   Inguinal hernia   Discharged Condition: good  Hospital Course: 84 yom with chronically incarcerated lih requiring foley.  I did open inguinal hernia with mesh. He did well overnight. He saw pt following day who recommended home pt.  He will keep Foley until he follows up with urology.  Consults: None  Significant Diagnostic Studies: none  Treatments: surgery: lih repair with mesh   Disposition: 01-Home or Self Care   Allergies as of 08/31/2017   No Known Allergies     Medication List    TAKE these medications   ALOE VERA JUICE PO Take 1 oz by mouth daily.   amiodarone 200 MG tablet Commonly known as:  PACERONE Take 0.5 tablets (100 mg total) by mouth daily.   amLODipine 5 MG tablet Commonly known as:  NORVASC Take 5 mg by mouth daily.   benazepril 40 MG tablet Commonly known as:  LOTENSIN Take 1 tablet (40 mg total) by mouth daily.   carvedilol 3.125 MG tablet Commonly known as:  COREG TAKE 1 TABLET (3.125 MG TOTAL) BY MOUTH 2 (TWO) TIMES DAILY.   docusate sodium 100 MG capsule Commonly known as:  COLACE Take 100 mg by mouth at bedtime.   enoxaparin 80 MG/0.8ML injection Commonly known as:  LOVENOX Inject 80 mg into the skin See admin instructions. Inject 80 mg SQ every 12 hours for 15 doses per pharmacy. Start on 02/01, only inject AM dose on 02/03   furosemide 40 MG tablet Commonly known as:  LASIX TAKE 2 TABLETS BY MOUTH IN THE MORNING AND TAKE 1 TAB EVERY EVEVING,MAY TAKE 1 EXTRA TAB IN AM. What changed:  See the new instructions.   KLOR-CON M20 20 MEQ tablet Generic drug:  potassium chloride SA TAKE 1 TABLET BY MOUTH DAILY What changed:    how much to take  how to take this  when to take this   levothyroxine 50 MCG tablet Commonly known as:  SYNTHROID, LEVOTHROID Take 50 mcg by mouth daily before breakfast.   nitroGLYCERIN 0.4 MG SL tablet Commonly known as:  NITROSTAT Place 1 tablet (0.4 mg total) under the tongue every 5 (five) minutes as needed. Up to 3 doses, if no relief after 3rd dose, proceed to the ED for an evaluation.   pantoprazole 40 MG tablet Commonly known as:  PROTONIX TAKE 1 TABLET BY MOUTH DAILY. What changed:    how much to take  how to take this  when to take this   pravastatin 40 MG tablet Commonly known as:  PRAVACHOL TAKE 1 TABLET BY MOUTH EVERY EVENING What changed:    how much to take  how to take this  when to take this   PROBIOTIC DAILY PO Take 1 tablet by mouth daily.   psyllium 58.6 % packet Commonly known as:  METAMUCIL Take 1 packet by mouth daily.   THEREMS M PO Take 1 tablet by mouth daily.   traMADol 50 MG tablet Commonly known as:  ULTRAM Take 1 tablet (50 mg total) by mouth every 6 (six) hours as needed.   warfarin 2 MG tablet Commonly known as:  COUMADIN Take 2-3 mg by mouth See admin instructions. Take 2 mg by mouth daily on Monday, Wednesday, Friday and Sunday. Take 3 mg by mouth daily  on all other days. Pt stops taking on 01/30 and 01/31 and starts Lovenox on 02/01      Follow-up Information    Rolm Bookbinder, MD Follow up in 3 week(s).   Specialty:  General Surgery Contact information: [place] [address] [phone]        [facility] Follow up.   WhyCarlena Bjornstad phone [phone] Contact information: [place] [address] [phone]           Signed: Rolm Bookbinder 09/01/2017, 1:00 PM

## 2017-09-02 ENCOUNTER — Encounter: Payer: Self-pay | Admitting: Cardiology

## 2017-09-02 NOTE — Progress Notes (Signed)
Letter  

## 2017-09-11 IMAGING — CR DG CHEST 1V PORT
1 series · 1 of 1 positions shown · non-contrast
Comparison: March 12, 2011.

CLINICAL DATA: Shortness of breath.

EXAM:
PORTABLE CHEST 1 VIEW

[AP]
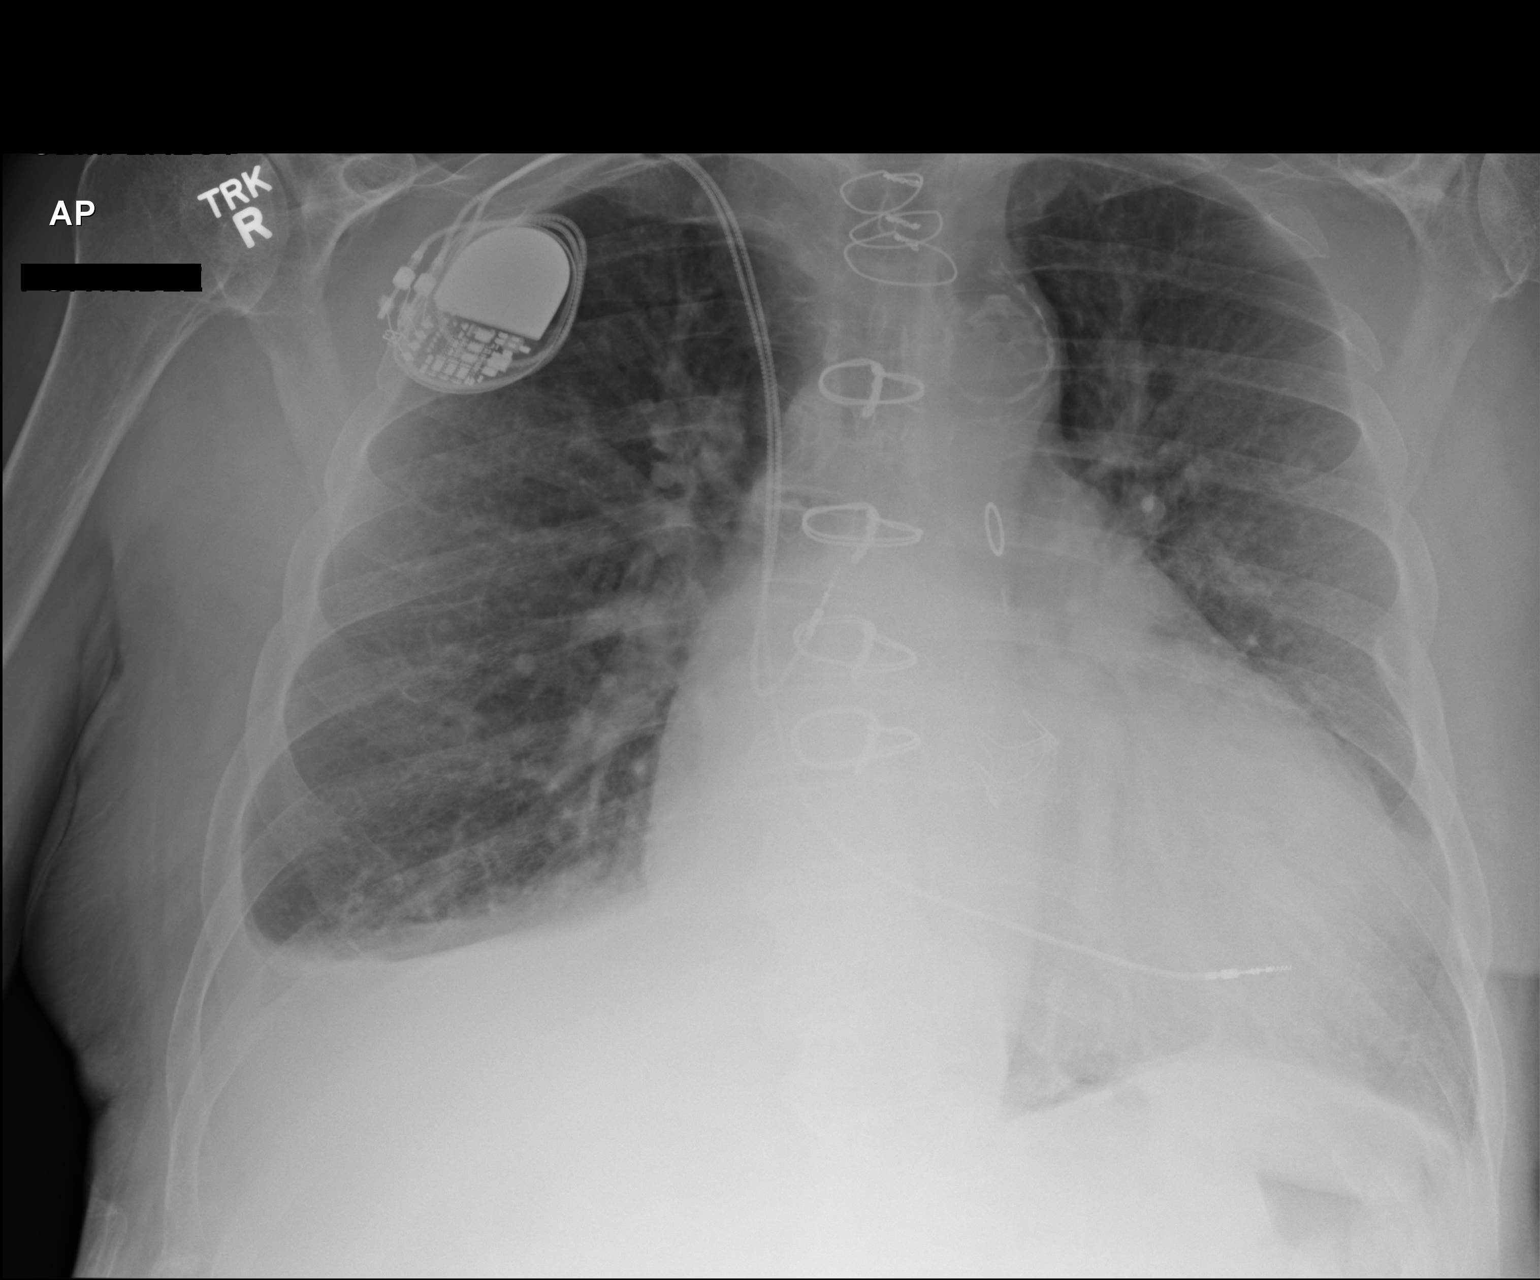

[1 of 1 positions shown; findings below may reference images not displayed]

FINDINGS: Stable cardiomegaly. Status post coronary artery bypass graft.
Right-sided pacemaker is unchanged in position. No pneumothorax is
noted. Left lung is clear. Mild right pleural effusion is noted.
Mild right basilar subsegmental atelectasis is noted as well. Bony
thorax is unremarkable.
IMPRESSION: Interval development of mild right basilar subsegmental atelectasis
and mild right pleural effusion.

## 2017-09-13 NOTE — Progress Notes (Signed)
[name] Kitchen    HEMATOLOGY/ONCOLOGY CLINIC NOTE  Date of Service: 09/15/17   Patient Care Team: Eber Hong, MD as PCP - General (Internal Medicine) Satira Sark, MD as PCP - Cardiology (Cardiology) Zenovia Jarred MD (Gastroenterology)  CHIEF COMPLAINTS F/u for iron deficiency anemia  HISTORY OF PRESENTING ILLNESS:   [name] is a wonderful 84 y.o. male who has been referred to [place] by Dr .Eber Hong, MD  for evaluation and management of Iron deficiency Anemia  He has a history of multiple medical comorbidities including coronary artery disease, chronic systolic CHF, atrial fibrillation on Coumadin, peripheral vascular disease, permanent pacemaker placement, adenomatous colonic polyps and long segment Barrett's esophagus without significant dysplasia on chronic PPI therapy.  Patient has been noting increasing fatigue from earlier this year and was noted to have anemia with hemoglobin of 7.8 with an MCV of 69 in January 2017. Ferritin level was noted to be 19 with an iron saturation of 3%. Fecal occult blood testing in March was noted to be positive for occult GI bleeding.  Patient received IV Feraheme in March and July 2017 for iron replacement. He had an upper endoscopy and colonoscopy which were performed on 11/12/2015. EGD revealed 4 cm of Barrett's esophagus which was biopsied and found to be Barrett's without dysplasia. There was a 3 cm hiatal hernia. Normal stomach and normal duodenum. Duodenal biopsies were negative for celiac. Colonoscopy performed on the same day revealed 2 cecal polyps. 3 ascending colon polyps one of which was 14 mm and removed with hot snare. It was prophylactically endoclipped. The remainder of the examination was unremarkable. Internal hemorrhoids were seen. These polyps were found to be adenomas without high-grade dysplasia. Has not had a capsule endoscopy.  Patient's hemoglobin levels have been somewhat better in the mid to high 9 range but with persistent  iron deficiency and lack of normalization of his anemia.  He has been referred to [place] for further management of his iron deficiency anemia. He continues to be on Coumadin for his atrial fibrillation. He has been on oral iron preparation which is causing some constipation and darker stools. Reports no overt GI bleeding. His last INR was 2.3 yesterday as per his report. He monitors this with his primary care physician. Notes persistent fatigue which is likely multifactorial.  Is on levothyroxine replacement for hypothyroidism. Takes a daily multivitamin.  Patient reports that he notes his weight closely to avoid fluid overload from CHF and notes no weight loss.  No other evidence of overt bleeding. No hematuria no gum bleeds no epistaxis. Notes easy skin bruising. Not using any over-the-counter NSAIDs.  INTERVAL HISTORY Mr. [name] Is here for his scheduled followup for Iron deficiency anemia. The patient's last visit with [place] was on 08/03/17. He is accompanied today by his wife. The pt reports that he is doing well overall. He reports that he had surgery for a hernia on 08/30/17 and a follow up on 09/17/17. He reports that he took Lovenox prior to surgery and after surgery.   Lab results today (09/15/17) of CBC, CMP, and Reticulocytes is as follows: all values are WNL except for RBC at 3.64, Hgb at 9.8, HCT at 31.6, MCH at 26.9, MCHC at 31.0, RDW at 21.5, Lymphs Abs at 0.6, Albumin at 3.4. Ferritin 09/15/17 up to 282 (from 70) Iron and TIBC 09/15/17 show Iron at 31 and Sat Ratio at 11.   On review of systems, pt denies any new symptoms. Urinary catheter is now out.  MEDICAL HISTORY:  Past Medical History:  Diagnosis Date  . Anemia   . Arthritis   . Atrial flutter (HCC)    Amiodarone therapy, coumadin therapy, prior history of cardioversion and overdrive pacing  . Barrett's esophagus   . Benign prostatic hyperplasia   . Cardiomyopathy    Mixed ischemic and nonischemic with previous EF of  35-40%, most recently in June 2009 normalized to 55-60%. Echo 2/11 EF 55-60%  . Carotid artery disease (HCC)    Less than 50% bilateral internal carotid artery stenosis by Doppler 2009  . Carpal tunnel syndrome   . Chronic diastolic (congestive) heart failure ([place])   . Coronary atherosclerosis of native coronary artery    Status post coronary bypass grafting  . 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 Endocarditis   . Essential hypertension, benign   . GERD (gastroesophageal reflux disease)    PMH  . Hiatal hernia   . HOH (hard of hearing)   . Hyperlipidemia   . Hypothyroidism   . Inguinal hernia    Left  . Internal hemorrhoids   . Iron deficiency anemia   . Obstructive sleep apnea   . Osteoporosis   . Paroxysmal atrial fibrillation (HCC)    Amiodarone therapy, status post Cox-Maze procedure but with recurrent atrial fibrillation while on amiodarone August 2012  . Presence of permanent cardiac pacemaker   . Symptomatic bradycardia   . Tubular adenoma of colon   . 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 Warfarin anticoagulation   . Wears glasses   . Wears partial dentures     SURGICAL HISTORY: Past Surgical History:  Procedure Laterality Date  . AORTIC VALVE REPLACEMENT    . CARPAL TUNNEL RELEASE  2009  . CATARACT EXTRACTION, BILATERAL  07/2011  . COLONOSCOPY W/ BIOPSIES AND POLYPECTOMY    . CORONARY ARTERY BYPASS GRAFT  2008   x2  . HERNIA REPAIR  age 72  . INGUINAL HERNIA REPAIR Left 08/30/2017   Procedure: LEFT INGUINAL HERNIA REPAIR;  Surgeon: Rolm Bookbinder, MD;  Location: [facility];  Service: General;  Laterality: Left;  GENERAL ETT AND TAP BLOCK  . INSERTION OF MESH Left 08/30/2017   Procedure: INSERTION OF MESH;  Surgeon: Rolm Bookbinder, MD;  Location: [facility];  Service: General;  Laterality: Left;  GENERAL ETT AND TAP BLOCK  . KNEE ARTHROSCOPY    .  PACEMAKER INSERTION  2009   Initially implanted at [facility], extracted by Dr Lovena Le for endocarditis and reimplanted 8/12  . PATELLA FRACTURE SURGERY  age 26   screw placed and removed  . TEE WITHOUT CARDIOVERSION N/A 07/05/2017   Procedure: TRANSESOPHAGEAL ECHOCARDIOGRAM (TEE);  Surgeon: Sanda Klein, MD;  Location: [place];  Service: Cardiovascular;  Laterality: N/A;  . TONSILLECTOMY  1939  . TOTAL HIP ARTHROPLASTY      SOCIAL HISTORY: Social History   Socioeconomic History  . Marital status: Married    Spouse name: Not on file  . Number of children: 3  . Years of education: Not on file  . Highest education level: Not on file  Social Needs  . Financial resource strain: Not on file  . Food insecurity - worry: Not on file  . Food insecurity - inability: Not on file  . Transportation needs - medical: Not on file  . Transportation needs - non-medical: Not on file  Occupational History  . Occupation:  Retired  Tobacco Use  . Smoking status: Former Smoker    Packs/day: 0.30    Years: 8.00    Pack years: 2.40    Types: Cigarettes    Start date: 03/25/1944    Last attempt to quit: 07/27/1954    Years since quitting: 63.1  . Smokeless tobacco: Never Used  Substance and Sexual Activity  . Alcohol use: Yes    Alcohol/week: 0.0 oz    Comment: glass of wine occ  . Drug use: No  . Sexual activity: Not on file  Other Topics Concern  . Not on file  Social History Narrative  . Not on file    FAMILY HISTORY: Family History  Problem Relation Age of Onset  . Coronary artery disease Father   . Other Father        Heart attack or stroke  . Cancer Father   . Colon cancer Mother   . Diabetes Neg Hx   . Hypertension Neg Hx   . Esophageal cancer Neg Hx   . Rectal cancer Neg Hx   . Stomach cancer Neg Hx     ALLERGIES:  has No Known Allergies.  MEDICATIONS:  Current Outpatient Medications  Medication Sig Dispense Refill  . ALOE VERA JUICE PO Take 1 oz by mouth daily.     [name] Kitchen  amiodarone (PACERONE) 200 MG tablet Take 0.5 tablets (100 mg total) by mouth daily. 45 tablet 3  . amLODipine (NORVASC) 5 MG tablet Take 5 mg by mouth daily.  3  . benazepril (LOTENSIN) 40 MG tablet Take 1 tablet (40 mg total) by mouth daily. 90 tablet 3  . carvedilol (COREG) 3.125 MG tablet TAKE 1 TABLET (3.125 MG TOTAL) BY MOUTH 2 (TWO) TIMES DAILY. 180 tablet 3  . docusate sodium (COLACE) 100 MG capsule Take 100 mg by mouth at bedtime.     . enoxaparin (LOVENOX) 80 MG/0.8ML injection Inject 80 mg into the skin See admin instructions. Inject 80 mg SQ every 12 hours for 15 doses per pharmacy. Start on 02/01, only inject AM dose on 02/03    . furosemide (LASIX) 40 MG tablet TAKE 2 TABLETS BY MOUTH IN THE MORNING AND TAKE 1 TAB EVERY EVEVING,MAY TAKE 1 EXTRA TAB IN AM. (Patient taking differently: TAKE 80 MG BY MOUTH IN THE MORNING AND TAKE 40 MG EVERY EVEVING, MAY TAKE ADDITIONAL 40 MG IN AM.) 90 tablet 2  . KLOR-CON M20 20 MEQ tablet TAKE 1 TABLET BY MOUTH DAILY (Patient taking differently: TAKE 20 MEQ BY MOUTH DAILY) 30 tablet 6  . levothyroxine (SYNTHROID, LEVOTHROID) 50 MCG tablet Take 50 mcg by mouth daily before breakfast.    . Multiple Vitamins-Minerals (THEREMS M PO) Take 1 tablet by mouth daily.      . nitroGLYCERIN (NITROSTAT) 0.4 MG SL tablet Place 1 tablet (0.4 mg total) under the tongue every 5 (five) minutes as needed. Up to 3 doses, if no relief after 3rd dose, proceed to the ED for an evaluation. 25 tablet 3  . pantoprazole (PROTONIX) 40 MG tablet TAKE 1 TABLET BY MOUTH DAILY. (Patient taking differently: TAKE 40 MG BY MOUTH DAILY.) 90 tablet 3  . pravastatin (PRAVACHOL) 40 MG tablet TAKE 1 TABLET BY MOUTH EVERY EVENING (Patient taking differently: TAKE 40 MG BY MOUTH EVERY EVENING) 90 tablet 2  . Probiotic Product (PROBIOTIC DAILY PO) Take 1 tablet by mouth daily.    . psyllium (METAMUCIL) 58.6 % packet Take 1 packet by mouth daily.    [name] Kitchen  warfarin (COUMADIN) 2 MG tablet Take 2-3 mg by  mouth See admin instructions. Take 2 mg by mouth daily on Monday, Wednesday, Friday and Sunday. Take 3 mg by mouth daily on all other days. Pt stops taking on 01/30 and 01/31 and starts Lovenox on 02/01     No current facility-administered medications for this visit.     REVIEW OF SYSTEMS:    .10 Point review of Systems was done is negative except as noted above. [name] Kitchen   PHYSICAL EXAMINATION:  ECOG PERFORMANCE STATUS: 3 - Symptomatic, >50% confined to bed  . Vitals:   09/15/17 1308  BP: (!) 105/53  Pulse: (!) 55  Resp: 18  Temp: 97.7 F (36.5 C)  SpO2: 97%   Filed Weights   09/15/17 1308  Weight: 171 lb 4.8 oz (77.7 kg)   .Body mass index is 27.23 kg/m.  [name] Kitchen GENERAL:alert, in no acute distress and comfortable SKIN: no acute rashes, no significant lesions EYES: conjunctiva are pink and non-injected, sclera anicteric OROPHARYNX: MMM, no exudates, no oropharyngeal erythema or ulceration NECK: supple, no JVD LYMPH:  no palpable lymphadenopathy in the cervical, axillary or inguinal regions LUNGS: clear to auscultation b/l with normal respiratory effort HEART: regular rate & rhythm ABDOMEN:  normoactive bowel sounds , non tender, not distended. Extremity: no pedal edema PSYCH: alert & oriented x 3 with fluent speech NEURO: no focal motor/sensory deficits  LABORATORY DATA:  I have reviewed the data as listed . CBC Latest Ref Rng & Units 09/15/2017 08/24/2017 08/03/2017  WBC 4.0 - 10.3 K/uL 5.1 5.1 5.8  Hemoglobin 13.0 - 17.0 g/dL - 10.3(L) -  Hematocrit 38.4 - 49.9 % 31.6(L) 32.9(L) 30.1(L)  Platelets 140 - 400 K/uL 206 115(L) 141  hgb 9.8  . CMP Latest Ref Rng & Units 09/15/2017 08/31/2017 08/24/2017  Glucose 70 - 140 mg/dL 101 115(H) 89  BUN 7 - 26 mg/dL 16 13 13   Creatinine 0.70 - 1.30 mg/dL 1.06 1.00 0.93  Sodium 136 - 145 mmol/L 141 140 138  Potassium 3.5 - 5.1 mmol/L 4.1 3.3(L) 4.0  Chloride 98 - 109 mmol/L 104 103 103  CO2 22 - 29 mmol/L 29 25 25   Calcium 8.4 - 10.4  mg/dL 9.6 8.9 8.9  Total Protein 6.4 - 8.3 g/dL 6.7 - -  Total Bilirubin 0.2 - 1.2 mg/dL 0.5 - -  Alkaline Phos 40 - 150 U/L 91 - -  AST 5 - 34 U/L 22 - -  ALT 0 - 55 U/L 21 - -    . Lab Results  Component Value Date   IRON 31 (L) 09/15/2017   TIBC 275 09/15/2017   IRONPCTSAT 11 (L) 09/15/2017   (Iron and TIBC)  Lab Results  Component Value Date   FERRITIN 282 09/15/2017    EGD 11/12/2015 - Esophageal mucosal changes suggestive of long-segment Barrett's esophagus. Biopsied. - 3 cm hiatal hernia. - Normal stomach. - Normal examined duodenum. Biopsied.  Colonoscopy 11/12/2015 - Two 4 to 5 mm polyps in the cecum, removed with a cold snare. Resected and retrieved. - Two 4 to 5 mm polyps in the ascending colon, removed with a cold snare. Resected and Retrieved - One 14 mm polyp in the ascending colon, removed with a hot snare. Resected and retrieved. Clip was placed. - Internal hemorrhoids.   RADIOGRAPHIC STUDIES: I have personally reviewed the radiological images as listed and agreed with the findings in the report. No results found.  ASSESSMENT & PLAN:   84 year old Caucasian male  with multiple medical comorbidities with  1) Microcytic Anemia -predominantly related to iron deficiency from chronic GI losses.  Patient has been on chronic anticoagulation for his atrial fibrillation and this will continue to be a risk factor from going GI losses. He has received IV Feraheme 2 in March and July 2017 without complete correction of his iron deficiency. This is suggestive of ongoing GI losses. Initial workup as noted below showed no monoclonal protein. No evidence of hemolysis with normal LDH and haptoglobin. Patient had IV Injectafer 750mg  qweekly x2 doses on 05/22/2016 and 05/29/2016. He has multiple potential sources of GI bleeding especially when on Coumadin.  Plan: -Discussed pt labwork today; Hgb down today at 9.8 and this is likely due to blood loss related to his  recent surgery two weeks ago.  -Ferritin levels are okay >100 -- at goal. No indication for additional IV Iron at this time.   2) Long segment Barrett's esophagus without dysplasia.  3) Hiatal hernia  4) multiple adenomatous colonic polyps.  Plan -high risk for ongoing GI bleeding -Limited benefit of oral iron at this time. -Avoid NSAIDs -Patient was counseled to have a discussion with his gastroenterologist, primary care physician and cardiologist regarding the risk versus benefit of ongoing anticoagulation in the setting of ongoing GI bleeding.  5). Patient Active Problem List   Diagnosis Date Noted  . Inguinal hernia 08/30/2017  . Bacteremia   . Hypoxia 07/03/2017  . Iron deficiency anemia due to chronic blood loss 05/14/2016  . Barrett's esophagus without dysplasia 01/22/2016  . Anemia 08/01/2015  . Symptomatic anemia 08/01/2015  . Chronic diastolic CHF (congestive heart failure) ([place]) 08/01/2015  . Sick sinus syndrome ([place]) 03/20/2013  . Pacemaker 11/24/2011  . Warfarin anticoagulation   . Paroxysmal atrial fibrillation (HCC)   . Valvular heart disease   . Atrial flutter ([place])   . Carotid artery disease ([place])   . History of endocarditis   . HYPERLIPIDEMIA 10/10/2010  . Obstructive sleep apnea 11/01/2009  . Hypertensive cardiovascular disease 04/10/2009  . Coronary atherosclerosis of native coronary artery 04/10/2009  . Secondary cardiomyopathy ([place]) 04/10/2009   -continue f/u with cardiology and PCP as per their recommendations.  RTC with Dr Irene Limbo in 8 weeks with labs   All of the patients questions were answered with apparent satisfaction. The patient knows to call the clinic with any problems, questions or concerns.  . The total time spent in the appointment was 15 minutes and more than 50% was on counseling and direct patient cares.    Sullivan Lone MD [place] AAHIVMS [facility] Hematology/Oncology Physician [place]  (Office):        [phone] (Work cell):  [phone] (Fax):           [phone]  This document serves as a record of services personally performed by Sullivan Lone, MD. It was created on his behalf by Baldwin Jamaica, a trained medical scribe. The creation of this record is based on the scribe's personal observations and the provider's statements to them.   .I have reviewed the above documentation for accuracy and completeness, and I agree with the above. Brunetta Genera MD MS

## 2017-09-15 ENCOUNTER — Encounter: Payer: Self-pay | Admitting: Hematology

## 2017-09-15 ENCOUNTER — Inpatient Hospital Stay: Payer: Medicare Other

## 2017-09-15 ENCOUNTER — Telehealth: Payer: Self-pay | Admitting: Hematology

## 2017-09-15 ENCOUNTER — Inpatient Hospital Stay: Payer: Medicare Other | Attending: Hematology | Admitting: Hematology

## 2017-09-15 VITALS — BP 105/53 | HR 55 | Temp 97.7°F | Resp 18 | Ht 66.5 in | Wt 171.3 lb

## 2017-09-15 DIAGNOSIS — I4892 Unspecified atrial flutter: Secondary | ICD-10-CM | POA: Insufficient documentation

## 2017-09-15 DIAGNOSIS — D500 Iron deficiency anemia secondary to blood loss (chronic): Secondary | ICD-10-CM

## 2017-09-15 DIAGNOSIS — Z7901 Long term (current) use of anticoagulants: Secondary | ICD-10-CM | POA: Diagnosis not present

## 2017-09-15 DIAGNOSIS — K449 Diaphragmatic hernia without obstruction or gangrene: Secondary | ICD-10-CM | POA: Insufficient documentation

## 2017-09-15 DIAGNOSIS — Z86010 Personal history of colonic polyps: Secondary | ICD-10-CM | POA: Insufficient documentation

## 2017-09-15 DIAGNOSIS — E039 Hypothyroidism, unspecified: Secondary | ICD-10-CM | POA: Diagnosis not present

## 2017-09-15 DIAGNOSIS — Z87891 Personal history of nicotine dependence: Secondary | ICD-10-CM | POA: Diagnosis not present

## 2017-09-15 DIAGNOSIS — K5900 Constipation, unspecified: Secondary | ICD-10-CM | POA: Diagnosis not present

## 2017-09-15 DIAGNOSIS — K2270 Barrett's esophagus without dysplasia: Secondary | ICD-10-CM | POA: Diagnosis not present

## 2017-09-15 DIAGNOSIS — Z79899 Other long term (current) drug therapy: Secondary | ICD-10-CM | POA: Diagnosis not present

## 2017-09-15 LAB — CBC WITH DIFFERENTIAL (CANCER CENTER ONLY)
Basophils Absolute: 0.1 10*3/uL (ref 0.0–0.1)
Basophils Relative: 2 %
Eosinophils Absolute: 0.2 10*3/uL (ref 0.0–0.5)
Eosinophils Relative: 3 %
HCT: 31.6 % — ABNORMAL LOW (ref 38.4–49.9)
Hemoglobin: 9.8 g/dL — ABNORMAL LOW (ref 13.0–17.1)
Lymphocytes Relative: 11 %
Lymphs Abs: 0.6 10*3/uL — ABNORMAL LOW (ref 0.9–3.3)
MCH: 26.9 pg — ABNORMAL LOW (ref 27.2–33.4)
MCHC: 31 g/dL — ABNORMAL LOW (ref 32.0–36.0)
MCV: 86.8 fL (ref 79.3–98.0)
Monocytes Absolute: 0.5 10*3/uL (ref 0.1–0.9)
Monocytes Relative: 9 %
Neutro Abs: 3.8 10*3/uL (ref 1.5–6.5)
Neutrophils Relative %: 75 %
Platelet Count: 206 10*3/uL (ref 140–400)
RBC: 3.64 MIL/uL — ABNORMAL LOW (ref 4.20–5.82)
RDW: 21.5 % — ABNORMAL HIGH (ref 11.0–14.6)
WBC Count: 5.1 10*3/uL (ref 4.0–10.3)

## 2017-09-15 LAB — CMP (CANCER CENTER ONLY)
ALT: 21 U/L (ref 0–55)
AST: 22 U/L (ref 5–34)
Albumin: 3.4 g/dL — ABNORMAL LOW (ref 3.5–5.0)
Alkaline Phosphatase: 91 U/L (ref 40–150)
Anion gap: 8 (ref 3–11)
BUN: 16 mg/dL (ref 7–26)
CO2: 29 mmol/L (ref 22–29)
Calcium: 9.6 mg/dL (ref 8.4–10.4)
Chloride: 104 mmol/L (ref 98–109)
Creatinine: 1.06 mg/dL (ref 0.70–1.30)
GFR, Est AFR Am: >60 mL/min (ref >60)
GFR, Estimated: >60 mL/min (ref >60)
Glucose, Bld: 101 mg/dL (ref 70–140)
Potassium: 4.1 mmol/L (ref 3.5–5.1)
Sodium: 141 mmol/L (ref 136–145)
Total Bilirubin: 0.5 mg/dL (ref 0.2–1.2)
Total Protein: 6.7 g/dL (ref 6.4–8.3)

## 2017-09-15 LAB — RETICULOCYTES
RBC.: 3.64 MIL/uL — ABNORMAL LOW (ref 4.20–5.82)
Retic Count, Absolute: 65.5 10*3/uL (ref 34.8–93.9)
Retic Ct Pct: 1.8 % (ref 0.8–1.8)

## 2017-09-15 LAB — IRON AND TIBC
Iron: 31 ug/dL — ABNORMAL LOW (ref 42–163)
Saturation Ratios: 11 % — ABNORMAL LOW (ref 42–163)
TIBC: 275 ug/dL (ref 202–409)
UIBC: 244 ug/dL

## 2017-09-15 LAB — FERRITIN: Ferritin: 282 ng/mL (ref 22–316)

## 2017-09-15 NOTE — Telephone Encounter (Signed)
Appointments scheduled AVS/Calendar printed for patient.

## 2017-09-15 NOTE — Patient Instructions (Signed)
Thank you for choosing [facility] to provide your oncology and hematology care.  To afford each patient quality time with our providers, please arrive 30 minutes before your scheduled appointment time.  If you arrive late for your appointment, you may be asked to reschedule.  We strive to give you quality time with our providers, and arriving late affects you and other patients whose appointments are after yours.   If you are a no show for multiple scheduled visits, you may be dismissed from the clinic at the providers discretion.    Again, thank you for choosing [facility], our hope is that these requests will decrease the amount of time that you wait before being seen by our physicians.  ______________________________________________________________________  Should you have questions after your visit to the [facility], please contact our office at [phone] between the hours of 8:30 and 4:30 p.m.    Voicemails left after 4:30p.m will not be returned until the following business day.    For prescription refill requests, please have your pharmacy contact us directly.  Please also try to allow 48 hours for prescription requests.    Please contact the scheduling department for questions regarding scheduling.  For scheduling of procedures such as PET scans, CT scans, MRI, Ultrasound, etc please contact central scheduling at [phone].    Resources For Cancer Patients and Caregivers:   Oncolink.org:  A wonderful resource for patients and healthcare providers for information regarding your disease, ways to tract your treatment, what to expect, etc.     American Cancer Society:  800-227-2345  Can help patients locate various types of support and financial assistance  Cancer Care: 1-800-813-HOPE (4673) Provides financial assistance, online support groups, medication/co-pay assistance.    [place] DSS:  336-641-3447 Where to apply for food  stamps, Medicaid, and utility assistance  Medicare Rights Center: 800-333-4114 Helps people with Medicare understand their rights and benefits, navigate the Medicare system, and secure the quality healthcare they deserve  SCAT: 336-333-6589 [employer] [employer]'s shared-ride transportation service for eligible riders who have a disability that prevents them from riding the fixed route bus.    For additional information on assistance programs please contact our social worker:   Grier Hock/Abigail Elmore:  [phone]            

## 2017-09-23 LAB — CUP PACEART REMOTE DEVICE CHECK
Battery Impedance: 1639 Ohm
Battery Remaining Longevity: 33 mo
Battery Voltage: 2.76 V
Brady Statistic AP VP Percent: 96 %
Brady Statistic AP VS Percent: 4 %
Brady Statistic AS VP Percent: 0 %
Brady Statistic AS VS Percent: 0 %
Date Time Interrogation Session: 20190206203419
Implantable Lead Implant Date: 20120815
Implantable Lead Implant Date: 20120815
Implantable Lead Location: 753859
Implantable Lead Location: 753860
Implantable Lead Model: 5076
Implantable Lead Model: 5076
Implantable Pulse Generator Implant Date: 20120815
Lead Channel Impedance Value: 409 Ohm
Lead Channel Impedance Value: 473 Ohm
Lead Channel Pacing Threshold Amplitude: 1.125 V
Lead Channel Pacing Threshold Pulse Width: 0.4 ms
Lead Channel Setting Pacing Amplitude: 2.5 V
Lead Channel Setting Pacing Amplitude: 2.5 V
Lead Channel Setting Pacing Pulse Width: 0.4 ms
Lead Channel Setting Sensing Sensitivity: 2 mV

## 2017-09-27 NOTE — Progress Notes (Signed)
Cardiology Office Note  Date: 09/28/2017   ID: [name], [name] [birth year], MRN [id number]  PCP: Eber Hong, MD  Primary Cardiologist: Rozann Lesches, MD   Chief Complaint  Patient presents with  . Diastolic heart failure    History of Present Illness: [name] is a medically complex 84 y.o. male last seen in January.  He is here today with his wife for a follow-up visit.  He did undergo interval left inguinal hernia repair in February with Dr. Donne Hazel.  He has had his Foley catheter taken out since that time and seems to be doing relatively well.  He is making urine, still has urgency.  He continues on diuretics with stable weight.  He continues on Coumadin with follow-up per Dr. Brynda Greathouse.  Not report any bleeding episodes.  He follows in the device clinic with Dr. Rayann Heman, [employer]tr[employer] pacemaker in place.  He does not report any palpitations.  I reviewed his medications which are stable from a cardiac perspective.  He has not required any additional Lasix recently.  His weight is down 3 pounds from February.  Past Medical History:  Diagnosis Date  . Anemia   . Arthritis   . Atrial flutter (HCC)    Amiodarone therapy, coumadin therapy, prior history of cardioversion and overdrive pacing  . Barrett's esophagus   . Benign prostatic hyperplasia   . Cardiomyopathy    Mixed ischemic and nonischemic with previous EF of 35-40%, most recently in June 2009 normalized to 55-60%. Echo 2/11 EF 55-60%  . Carotid artery disease (HCC)    Less than 50% bilateral internal carotid artery stenosis by Doppler 2009  . Carpal tunnel syndrome   . Chronic diastolic (congestive) heart failure ([place])   . Coronary atherosclerosis of native coronary artery    Status post coronary bypass grafting  . 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 Endocarditis   . Essential hypertension, benign   . GERD (gastroesophageal reflux disease)    PMH  .  Hiatal hernia   . HOH (hard of hearing)   . Hyperlipidemia   . Hypothyroidism   . Inguinal hernia    Left  . Internal hemorrhoids   . Iron deficiency anemia   . Obstructive sleep apnea   . Osteoporosis   . Paroxysmal atrial fibrillation (HCC)    Amiodarone therapy, status post Cox-Maze procedure but with recurrent atrial fibrillation while on amiodarone August 2012  . Presence of permanent cardiac pacemaker   . Symptomatic bradycardia   . Tubular adenoma of colon   . 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 Warfarin anticoagulation   . Wears glasses   . Wears partial dentures     Past Surgical History:  Procedure Laterality Date  . AORTIC VALVE REPLACEMENT    . CARPAL TUNNEL RELEASE  2009  . CATARACT EXTRACTION, BILATERAL  07/2011  . COLONOSCOPY W/ BIOPSIES AND POLYPECTOMY    . CORONARY ARTERY BYPASS GRAFT  2008   x2  . HERNIA REPAIR  age 71  . INGUINAL HERNIA REPAIR Left 08/30/2017   Procedure: LEFT INGUINAL HERNIA REPAIR;  Surgeon: Rolm Bookbinder, MD;  Location: [facility];  Service: General;  Laterality: Left;  GENERAL ETT AND TAP BLOCK  . INSERTION OF MESH Left 08/30/2017   Procedure: INSERTION OF MESH;  Surgeon: Rolm Bookbinder, MD;  Location: [facility];  Service: General;  Laterality: Left;  GENERAL ETT AND TAP BLOCK  . KNEE ARTHROSCOPY    . PACEMAKER INSERTION  2009   [facility]ially implanted at [facility], extracted by Dr Lovena Le for endocarditis and reimplanted 8/12  . PATELLA FRACTURE SURGERY  age 72   screw placed and removed  . TEE WITHOUT CARDIOVERSION N/A 07/05/2017   Procedure: TRANSESOPHAGEAL ECHOCARDIOGRAM (TEE);  Surgeon: Sanda Klein, MD;  Location: [facility];  Service: Cardiovascular;  Laterality: N/A;  . TONSILLECTOMY  1939  . TOTAL HIP ARTHROPLASTY      Current Outpatient Medications  Medication Sig Dispense Refill  . ALOE VERA JUICE PO Take 1 oz by mouth daily.     Marland Kitchen amiodarone (PACERONE) 200 MG tablet Take  0.5 tablets (100 mg total) by mouth daily. 45 tablet 3  . amLODipine (NORVASC) 5 MG tablet Take 5 mg by mouth daily.  3  . benazepril (LOTENSIN) 40 MG tablet Take 1 tablet (40 mg total) by mouth daily. 90 tablet 3  . carvedilol (COREG) 3.125 MG tablet TAKE 1 TABLET (3.125 MG TOTAL) BY MOUTH 2 (TWO) TIMES DAILY. 180 tablet 3  . docusate sodium (COLACE) 100 MG capsule Take 100 mg by mouth at bedtime.     . furosemide (LASIX) 40 MG tablet TAKE 2 TABLETS BY MOUTH IN THE MORNING AND TAKE 1 TAB EVERY EVEVING,MAY TAKE 1 EXTRA TAB IN AM. (Patient taking differently: TAKE 80 MG BY MOUTH IN THE MORNING AND TAKE 40 MG EVERY EVEVING, MAY TAKE ADDITIONAL 40 MG IN AM.) 90 tablet 2  . KLOR-CON M20 20 MEQ tablet TAKE 1 TABLET BY MOUTH DAILY (Patient taking differently: TAKE 20 MEQ BY MOUTH DAILY) 30 tablet 6  . levothyroxine (SYNTHROID, LEVOTHROID) 50 MCG tablet Take 50 mcg by mouth daily before breakfast.    . Multiple Vitamins-Minerals (THEREMS M PO) Take 1 tablet by mouth daily.      . nitroGLYCERIN (NITROSTAT) 0.4 MG SL tablet Place 1 tablet (0.4 mg total) under the tongue every 5 (five) minutes as needed. Up to 3 doses, if no relief after 3rd dose, proceed to the ED for an evaluation. 25 tablet 3  . pantoprazole (PROTONIX) 40 MG tablet TAKE 1 TABLET BY MOUTH DAILY. (Patient taking differently: TAKE 40 MG BY MOUTH DAILY.) 90 tablet 3  . pravastatin (PRAVACHOL) 40 MG tablet TAKE 1 TABLET BY MOUTH EVERY EVENING (Patient taking differently: TAKE 40 MG BY MOUTH EVERY EVENING) 90 tablet 2  . Probiotic Product (PROBIOTIC DAILY PO) Take 1 tablet by mouth daily.    . psyllium (METAMUCIL) 58.6 % packet Take 1 packet by mouth daily.    Marland Kitchen warfarin (COUMADIN) 2 MG tablet Take 2-3 mg by mouth See admin instructions. Take 2 mg by mouth daily on Monday, Wednesday, Friday and Sunday. Take 3 mg by mouth daily on all other days. Pt stops taking on 01/30 and 01/31 and starts Lovenox on 02/01     No current facility-administered  medications for this visit.    Allergies:  Patient has no known allergies.   Social History: The patient  reports that he quit smoking about 63 years ago. His smoking use included cigarettes. He started smoking about 73 years ago. He has a 2.40 pack-year smoking history. he has never used smokeless tobacco. He reports that he drinks alcohol. He reports that he does not use drugs.   ROS:  Please see the history of present illness. Otherwise, complete review of systems is positive for urinary urgency.  All other systems are reviewed and negative.  Physical Exam: VS:  BP (!) 120/52   Pulse 85   Ht 5\' 6"  (1.676 m)   Wt 168 lb (76.2 kg)   SpO2 95%   BMI 27.12 kg/m , BMI Body mass index is 27.12 kg/m.  Wt Readings from Last 3 Encounters:  09/28/17 168 lb (76.2 kg)  09/15/17 171 lb 4.8 oz (77.7 kg)  08/30/17 171 lb (77.6 kg)    General: Elderly male in wheelchair. Appears comfortable at rest. HEENT: Conjunctiva and lids normal, oropharynx clear. Neck: Supple, no elevated JVP or carotid bruits, no thyromegaly. Lungs: Clear to auscultation, nonlabored breathing at rest. Cardiac: Regular rate and rhythm with ectopy, no S3, 2/6 systolic murmur. Abdomen: Soft, nontender, bowel sounds present, no guarding or rebound. Extremities: Mild leg edema, distal pulses 2+. Skin: Warm and dry. Musculoskeletal: No kyphosis. Neuropsychiatric: Alert and oriented x3, affect grossly appropriate.  ECG: I personally reviewed the tracing from 07/03/2017 which showed dual-chamber pacing.  Recent Labwork: 07/03/2017: B Natriuretic Peptide 695.9; Magnesium 2.2 07/04/2017: TSH 2.303 08/24/2017: Hemoglobin 10.3 09/15/2017: ALT 21; AST 22; BUN 16; Creatinine 1.06; Platelet Count 206; Potassium 4.1; Sodium 141   Other Studies Reviewed Today:  TEE 07/05/2017: Study Conclusions  - Left ventricle: There was mild concentric hypertrophy. Systolic function was normal. The estimated ejection fraction was in  the range of 55% to 60%. Wall motion was normal; there were no regional wall motion abnormalities. - Aortic valve: A 21.0 cmCarpentier-Edwards pericardial bioprosthesis was present. The prosthesis had a normal range of motion. The sewing ring appeared normal. No evidence of vegetation. Valve area (VTI): 0.77 cm^2. Valve area (Vmax): 0.77 cm^2. Valve area (Vmean): 0.76 cm^2. - Mitral valve: Prior procedures included surgical repair. An annular ring prosthesis was present and functioning normally. No evidence of vegetation. There was mild to moderate regurgitation directed eccentrically and toward the septum. Mean gradient (D): 4 mm Hg at a heart rate of 70 bpm. Valve area by pressure half-time: 2 cm^2. Valve area by continuity equation (using LVOT flow): 1.29 cm^2. - Left atrium: The atrium was severely dilated. No evidence of thrombus in the atrial cavity or appendage. There was mildcontinuous spontaneous echo contrast (&quot;smoke&quot;) in the cavity and the appendage. There is only a remnant of the appendage, which appears to be surgically resected or clipped. - Right ventricle: The cavity size was mildly dilated. - Right atrium: The atrium was severely dilated. - Tricuspid valve: No evidence of vegetation. There was moderate regurgitation. - Pulmonic valve: No evidence of vegetation.  Impressions:  - The very high transvalvular gradients through the aortic valve prosthesis, reported on the transthoracic study, are not confirmed on the current study, but the gradients are nevertheless abnormally high.  Recommendations: No evidence of vegetation or perivalvular abscess. The aortic valve gradients are lower than on the recent transthoracic study and may be underestimated due to poor Doppler beam alignment. Even so, there is a big discrepancy between the normal leaflet motion and the elevated gradients. This is likely explained by  a combination of patient valve mismatch (dimensionless index 0.23) and high cardiac output (e.g. anemia, infection, etc.).  Assessment and Plan:  1.  Chronic diastolic heart failure.  He has maintained stable weights on current Lasix dosing.  LVEF 55-60% range.  No changes were made today.  2.  Paroxysmal atrial fibrillation.  Continue Coumadin and low-dose amiodarone.  Recent TSH and LFTs normal.  3.  Sick sinus syndrome status post Medtronic pacemaker placement.  He continues to follow in the  device clinic with Dr. Rayann Heman.  4.  CAD status post CABG.  No active angina symptoms.  5.  History of bioprosthetic AVR and mitral valve repair.  Current medicines were reviewed with the patient today.   Disposition: Follow-up in 3 months.  Signed, Satira Sark, MD, [facility] 09/28/2017 3:42 PM    [facility] [place], [place], [place] [address] Phone: [phone]; Fax: [phone]

## 2017-09-28 ENCOUNTER — Ambulatory Visit (INDEPENDENT_AMBULATORY_CARE_PROVIDER_SITE_OTHER): Payer: Medicare Other | Admitting: Cardiology

## 2017-09-28 ENCOUNTER — Encounter: Payer: Self-pay | Admitting: Cardiology

## 2017-09-28 VITALS — BP 120/52 | HR 85 | Ht 66.0 in | Wt 168.0 lb

## 2017-09-28 DIAGNOSIS — I38 Endocarditis, valve unspecified: Secondary | ICD-10-CM

## 2017-09-28 DIAGNOSIS — I495 Sick sinus syndrome: Secondary | ICD-10-CM

## 2017-09-28 DIAGNOSIS — I480 Paroxysmal atrial fibrillation: Secondary | ICD-10-CM

## 2017-09-28 DIAGNOSIS — I5032 Chronic diastolic (congestive) heart failure: Secondary | ICD-10-CM

## 2017-09-28 NOTE — Patient Instructions (Signed)
Your physician recommends that you schedule a follow-up appointment in: 3 MONTHS WITH DR. MCDOWELL  Your physician recommends that you continue on your current medications as directed. Please refer to the Current Medication list given to you today.  Thank you for choosing [facility]!!    

## 2017-10-15 ENCOUNTER — Other Ambulatory Visit: Payer: Self-pay | Admitting: Internal Medicine

## 2017-10-15 DIAGNOSIS — K2270 Barrett's esophagus without dysplasia: Secondary | ICD-10-CM

## 2017-10-20 ENCOUNTER — Other Ambulatory Visit: Payer: Self-pay | Admitting: Cardiology

## 2017-10-28 NOTE — Progress Notes (Signed)
Riley Kitchen    HEMATOLOGY/ONCOLOGY CLINIC NOTE  Date of Service: 11/02/17   Patient Care Team: Eber Hong, MD as PCP - General (Internal Medicine) Satira Sark, MD as PCP - Cardiology (Cardiology) Zenovia Jarred MD (Gastroenterology)  CHIEF COMPLAINTS F/u for iron deficiency anemia  HISTORY OF PRESENTING ILLNESS:   [name] is a wonderful 84 y.o. male who has been referred to [place] by Dr .Eber Hong, MD  for evaluation and management of Iron deficiency Anemia  He has a history of multiple medical comorbidities including coronary artery disease, chronic systolic CHF, atrial fibrillation on Coumadin, peripheral vascular disease, permanent pacemaker placement, adenomatous colonic polyps and long segment Barrett's esophagus without significant dysplasia on chronic PPI therapy.  Patient has been noting increasing fatigue from earlier this year and was noted to have anemia with hemoglobin of 7.8 with an MCV of 69 in January 2017. Ferritin level was noted to be 19 with an iron saturation of 3%. Fecal occult blood testing in March was noted to be positive for occult GI bleeding.  Patient received IV Feraheme in March and July 2017 for iron replacement. He had an upper endoscopy and colonoscopy which were performed on 11/12/2015. EGD revealed 4 cm of Barrett's esophagus which was biopsied and found to be Barrett's without dysplasia. There was a 3 cm hiatal hernia. Normal stomach and normal duodenum. Duodenal biopsies were negative for celiac. Colonoscopy performed on the same day revealed 2 cecal polyps. 3 ascending colon polyps one of which was 14 mm and removed with hot snare. It was prophylactically endoclipped. The remainder of the examination was unremarkable. Internal hemorrhoids were seen. These polyps were found to be adenomas without high-grade dysplasia. Has not had a capsule endoscopy.  Patient's hemoglobin levels have been somewhat better in the mid to high 9 range but with persistent  iron deficiency and lack of normalization of his anemia.  He has been referred to [place] for further management of his iron deficiency anemia. He continues to be on Coumadin for his atrial fibrillation. He has been on oral iron preparation which is causing some constipation and darker stools. Reports no overt GI bleeding. His last INR was 2.3 yesterday as per his report. He monitors this with his primary care physician. Notes persistent fatigue which is likely multifactorial.  Is on levothyroxine replacement for hypothyroidism. Takes a daily multivitamin.  Patient reports that he notes his weight closely to avoid fluid overload from CHF and notes no weight loss.  No other evidence of overt bleeding. No hematuria no gum bleeds no epistaxis. Notes easy skin bruising. Not using any over-the-counter NSAIDs.   INTERVAL HISTORY  [name] Is here for his scheduled followup for Iron deficiency anemia. He presents to the clinic today with his wife. He notes he has been doing well. He notes he is getting his strength back since hernia surgery. He has been getting/needing IV Iron every few months. His wife knows he had been able to reduce swelling with Lasix. He has had frequent urination He tries to drink most of his water in the morning.   He drink 16 oz of water in the morning.   On review of symptoms, pt note he is eating well and regaining strength.     MEDICAL HISTORY:  Past Medical History:  Diagnosis Date  . Anemia   . Arthritis   . Atrial flutter (HCC)    Amiodarone therapy, coumadin therapy, prior history of cardioversion and overdrive pacing  . Barrett's esophagus   .  Benign prostatic hyperplasia   . Cardiomyopathy    Mixed ischemic and nonischemic with previous EF of 35-40%, most recently in June 2009 normalized to 55-60%. Echo 2/11 EF 55-60%  . Carotid artery disease (HCC)    Less than 50% bilateral internal carotid artery stenosis by Doppler 2009  . Carpal tunnel syndrome   .  Chronic diastolic (congestive) heart failure ([place])   . Coronary atherosclerosis of native coronary artery    Status post coronary bypass grafting  . 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 Endocarditis   . Essential hypertension, benign   . GERD (gastroesophageal reflux disease)    PMH  . Hiatal hernia   . HOH (hard of hearing)   . Hyperlipidemia   . Hypothyroidism   . Inguinal hernia    Left  . Internal hemorrhoids   . Iron deficiency anemia   . Obstructive sleep apnea   . Osteoporosis   . Paroxysmal atrial fibrillation (HCC)    Amiodarone therapy, status post Cox-Maze procedure but with recurrent atrial fibrillation while on amiodarone August 2012  . Presence of permanent cardiac pacemaker   . Symptomatic bradycardia   . Tubular adenoma of colon   . 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 Warfarin anticoagulation   . Wears glasses   . Wears partial dentures     SURGICAL HISTORY: Past Surgical History:  Procedure Laterality Date  . AORTIC VALVE REPLACEMENT    . CARPAL TUNNEL RELEASE  2009  . CATARACT EXTRACTION, BILATERAL  07/2011  . COLONOSCOPY W/ BIOPSIES AND POLYPECTOMY    . CORONARY ARTERY BYPASS GRAFT  2008   x2  . HERNIA REPAIR  age 79  . INGUINAL HERNIA REPAIR Left 08/30/2017   Procedure: LEFT INGUINAL HERNIA REPAIR;  Surgeon: Rolm Bookbinder, MD;  Location: [place];  Service: General;  Laterality: Left;  GENERAL ETT AND TAP BLOCK  . INSERTION OF MESH Left 08/30/2017   Procedure: INSERTION OF MESH;  Surgeon: Rolm Bookbinder, MD;  Location: [place];  Service: General;  Laterality: Left;  GENERAL ETT AND TAP BLOCK  . KNEE ARTHROSCOPY    . PACEMAKER INSERTION  2009   [facility]ially implanted [facility], extracted by Dr Lovena Le for endocarditis and reimplanted 8/12  . PATELLA FRACTURE SURGERY  age 55   screw placed and removed  . TEE WITHOUT CARDIOVERSION N/A  07/05/2017   Procedure: TRANSESOPHAGEAL ECHOCARDIOGRAM (TEE);  Surgeon: Sanda Klein, MD;  Location: [place];  Service: Cardiovascular;  Laterality: N/A;  . TONSILLECTOMY  1939  . TOTAL HIP ARTHROPLASTY      SOCIAL HISTORY: Social History   Socioeconomic History  . Marital status: Married    Spouse name: Not on file  . Number of children: 3  . Years of education: Not on file  . Highest education level: Not on file  Occupational History  . Occupation: Retired  Scientific laboratory technician  . Financial resource strain: Not on file  . Food insecurity:    Worry: Not on file    Inability: Not on file  . Transportation needs:    Medical: Not on file    Non-medical: Not on file  Tobacco Use  . Smoking status: Former Smoker    Packs/day: 0.30    Years: 8.00    Pack years: 2.40    Types: Cigarettes    Start date: 03/25/1944    Last attempt to  quit: 07/27/1954    Years since quitting: 63.3  . Smokeless tobacco: Never Used  Substance and Sexual Activity  . Alcohol use: Yes    Alcohol/week: 0.0 oz    Comment: glass of wine occ  . Drug use: No  . Sexual activity: Not on file  Lifestyle  . Physical activity:    Days per week: Not on file    Minutes per session: Not on file  . Stress: Not on file  Relationships  . Social connections:    Talks on phone: Not on file    Gets together: Not on file    Attends religious service: Not on file    Active member of club or organization: Not on file    Attends meetings of clubs or organizations: Not on file    Relationship status: Not on file  . Intimate partner violence:    Fear of current or ex partner: Not on file    Emotionally abused: Not on file    Physically abused: Not on file    Forced sexual activity: Not on file  Other Topics Concern  . Not on file  Social History Narrative  . Not on file    FAMILY HISTORY: Family History  Problem Relation Age of Onset  . Coronary artery disease Father   . Other Father        Heart attack  or stroke  . Cancer Father   . Colon cancer Mother   . Diabetes Neg Hx   . Hypertension Neg Hx   . Esophageal cancer Neg Hx   . Rectal cancer Neg Hx   . Stomach cancer Neg Hx     ALLERGIES:  has No Known Allergies.  MEDICATIONS:  Current Outpatient Medications  Medication Sig Dispense Refill  . ALOE VERA JUICE PO Take 1 oz by mouth daily.     Riley Kitchen amiodarone (PACERONE) 200 MG tablet Take 0.5 tablets (100 mg total) by mouth daily. 45 tablet 3  . amLODipine (NORVASC) 5 MG tablet Take 5 mg by mouth daily.  3  . benazepril (LOTENSIN) 40 MG tablet Take 1 tablet (40 mg total) by mouth daily. 90 tablet 3  . carvedilol (COREG) 3.125 MG tablet TAKE 1 TABLET (3.125 MG TOTAL) BY MOUTH 2 (TWO) TIMES DAILY. 180 tablet 3  . docusate sodium (COLACE) 100 MG capsule Take 100 mg by mouth at bedtime.     . furosemide (LASIX) 40 MG tablet TAKE 2 TABLETS BY MOUTH IN THE MORNING AND TAKE 1 TAB EVERY EVEVING,MAY TAKE 1 EXTRA TAB IN AM. 100 tablet 2  . KLOR-CON M20 20 MEQ tablet TAKE 1 TABLET BY MOUTH DAILY (Patient taking differently: TAKE 20 MEQ BY MOUTH DAILY) 30 tablet 6  . levothyroxine (SYNTHROID, LEVOTHROID) 50 MCG tablet Take 50 mcg by mouth daily before breakfast.    . Multiple Vitamins-Minerals (THEREMS M PO) Take 1 tablet by mouth daily.      . nitroGLYCERIN (NITROSTAT) 0.4 MG SL tablet Place 1 tablet (0.4 mg total) under the tongue every 5 (five) minutes as needed. Up to 3 doses, if no relief after 3rd dose, proceed to the ED for an evaluation. 25 tablet 3  . pantoprazole (PROTONIX) 40 MG tablet TAKE 1 TABLET BY MOUTH DAILY. 90 tablet 1  . pravastatin (PRAVACHOL) 40 MG tablet TAKE 1 TABLET BY MOUTH EVERY EVENING (Patient taking differently: TAKE 40 MG BY MOUTH EVERY EVENING) 90 tablet 2  . Probiotic Product (PROBIOTIC DAILY PO) Take 1 tablet by  mouth daily.    . psyllium (METAMUCIL) 58.6 % packet Take 1 packet by mouth daily.    Riley Kitchen warfarin (COUMADIN) 2 MG tablet Take 2-3 mg by mouth See admin  instructions. Take 2 mg by mouth daily on Monday, Wednesday, Friday and Sunday. Take 3 mg by mouth daily on all other days. Pt stops taking on 01/30 and 01/31 and starts Lovenox on 02/01     No current facility-administered medications for this visit.     REVIEW OF SYSTEMS:    .10 Point review of Systems was done is negative except as noted above.  PHYSICAL EXAMINATION:  ECOG PERFORMANCE STATUS: 3 - Symptomatic, >50% confined to bed  . Vitals:   11/02/17 1335  BP: 135/67  Pulse: 60  Resp: 16  Temp: 97.9 F (36.6 C)  SpO2: 92%   Filed Weights   11/02/17 1335  Weight: 171 lb 1.6 oz (77.6 kg)   .Body mass index is 27.62 kg/m. Riley Kitchen GENERAL:alert, in no acute distress and comfortable SKIN: no acute rashes, no significant lesions EYES: conjunctiva are pink and non-injected, sclera anicteric OROPHARYNX: MMM, no exudates, no oropharyngeal erythema or ulceration NECK: supple, no JVD LYMPH:  no palpable lymphadenopathy in the cervical, axillary or inguinal regions LUNGS: clear to auscultation b/l with normal respiratory effort HEART: regular rate & rhythm ABDOMEN:  normoactive bowel sounds , non tender, not distended. Extremity: no pedal edema PSYCH: alert & oriented x 3 with fluent speech NEURO: no focal motor/sensory deficits  LABORATORY DATA:  I have reviewed the data as listed . CBC Latest Ref Rng & Units 11/02/2017 09/15/2017 08/24/2017  WBC 4.0 - 10.3 K/uL 4.7 5.1 5.1  Hemoglobin 13.0 - 17.0 g/dL - - 10.3(L)  Hematocrit 38.4 - 49.9 % 29.5(L) 31.6(L) 32.9(L)  Platelets 140 - 400 K/uL 135(L) 206 115(L)  hgb 9.2  . CMP Latest Ref Rng & Units 11/02/2017 09/15/2017 08/31/2017  Glucose 70 - 140 mg/dL 113 101 115(H)  BUN 7 - 26 mg/dL 16 16 13   Creatinine 0.70 - 1.30 mg/dL 1.10 1.06 1.00  Sodium 136 - 145 mmol/L 142 141 140  Potassium 3.5 - 5.1 mmol/L 3.8 4.1 3.3(L)  Chloride 98 - 109 mmol/L 108 104 103  CO2 22 - 29 mmol/L 27 29 25   Calcium 8.4 - 10.4 mg/dL 9.3 9.6 8.9  Total  Protein 6.4 - 8.3 g/dL 6.6 6.7 -  Total Bilirubin 0.2 - 1.2 mg/dL 0.5 0.5 -  Alkaline Phos 40 - 150 U/L 107 91 -  AST 5 - 34 U/L 18 22 -  ALT 0 - 55 U/L 16 21 -    . Lab Results  Component Value Date   IRON 22 (L) 11/02/2017   TIBC 328 11/02/2017   IRONPCTSAT 7 (L) 11/02/2017   (Iron and TIBC)  Lab Results  Component Value Date   FERRITIN 61 11/02/2017    EGD 11/12/2015 - Esophageal mucosal changes suggestive of long-segment Barrett's esophagus. Biopsied. - 3 cm hiatal hernia. - Normal stomach. - Normal examined duodenum. Biopsied.  Colonoscopy 11/12/2015 - Two 4 to 5 mm polyps in the cecum, removed with a cold snare. Resected and retrieved. - Two 4 to 5 mm polyps in the ascending colon, removed with a cold snare. Resected and Retrieved - One 14 mm polyp in the ascending colon, removed with a hot snare. Resected and retrieved. Clip was placed. - Internal hemorrhoids.   RADIOGRAPHIC STUDIES: I have personally reviewed the radiological images as listed and agreed with the  findings in the report. No results found.  ASSESSMENT & PLAN:   84 year old Caucasian male with multiple medical comorbidities with  1) Microcytic Anemia -predominantly related to iron deficiency from chronic GI losses.  Patient has been on chronic anticoagulation for his atrial fibrillation and this will continue to be a risk factor from going GI losses. He has received IV Feraheme 2 in March and July 2017 without complete correction of his iron deficiency. This is suggestive of ongoing GI losses. Initial workup as noted below showed no monoclonal protein. No evidence of hemolysis with normal LDH and haptoglobin. Patient had IV Injectafer 750mg  qweekly x2 doses on 05/22/2016 and 05/29/2016. He has multiple potential sources of GI bleeding especially when on Coumadin.  PLAN:  -Discussed pt labwork today; Hgb down today at 9.2. His ferritin is down to 61 and iron sauration is at 7%. -Goal Ferritin  levels >100. I -  Will give IV injectafer 2 doses over the next 2 weeks.  -Will follow up with him every 3-4 months -high risk for ongoing GI bleeding -Limited benefit of oral iron at this time. -Avoid NSAIDs completely  2) Long segment Barrett's esophagus without dysplasia.  3) Hiatal hernia  4) multiple adenomatous colonic polyps.  PLAN:  -Limited benefit of oral iron at this time. -Patient was counseled to have a discussion with his gastroenterologist, primary care physician and cardiologist regarding the risk versus benefit of ongoing anticoagulation in the setting of ongoing GI bleeding.  5). Patient Active Problem List   Diagnosis Date Noted  . Inguinal hernia 08/30/2017  . Bacteremia   . Hypoxia 07/03/2017  . Iron deficiency anemia due to chronic blood loss 05/14/2016  . Barrett's esophagus without dysplasia 01/22/2016  . Anemia 08/01/2015  . Symptomatic anemia 08/01/2015  . Chronic diastolic CHF (congestive heart failure) ([place]) 08/01/2015  . Sick sinus syndrome ([place]) 03/20/2013  . Pacemaker 11/24/2011  . Warfarin anticoagulation   . Paroxysmal atrial fibrillation (HCC)   . Valvular heart disease   . Atrial flutter ([place])   . Carotid artery disease ([place])   . History of endocarditis   . HYPERLIPIDEMIA 10/10/2010  . Obstructive sleep apnea 11/01/2009  . Hypertensive cardiovascular disease 04/10/2009  . Coronary atherosclerosis of native coronary artery 04/10/2009  . Secondary cardiomyopathy ([place]) 04/10/2009   -continue f/u with cardiology and PCP as per their recommendations.   IV injectafer weekly x 2 doses starting within 1 week RTC with Dr Irene Limbo in 3 months with labs   All of the patients questions were answered with apparent satisfaction. The patient knows to call the clinic with any problems, questions or concerns.  The total time spent in the appointment was 15 minutes and more than 50% was on counseling and direct patient cares.  Sullivan Lone MD [place]  AAHIVMS [facility] Hematology/Oncology Physician [place]  (Office):       [phone] (Work cell):  [phone] (Fax):           [phone]  This document serves as a record of services personally performed by Sullivan Lone, MD. It was created on his behalf by Joslyn Devon, a trained medical scribe. The creation of this record is based on the scribe's personal observations and the provider's statements to them.    .I have reviewed the above documentation for accuracy and completeness, and I agree with the above. Brunetta Genera MD MS

## 2017-11-02 ENCOUNTER — Telehealth: Payer: Self-pay | Admitting: Hematology

## 2017-11-02 ENCOUNTER — Inpatient Hospital Stay: Payer: Medicare Other | Attending: Hematology

## 2017-11-02 ENCOUNTER — Inpatient Hospital Stay (HOSPITAL_BASED_OUTPATIENT_CLINIC_OR_DEPARTMENT_OTHER): Payer: Medicare Other | Admitting: Hematology

## 2017-11-02 VITALS — BP 135/67 | HR 60 | Temp 97.9°F | Resp 16 | Ht 66.0 in | Wt 171.1 lb

## 2017-11-02 DIAGNOSIS — Z7901 Long term (current) use of anticoagulants: Secondary | ICD-10-CM

## 2017-11-02 DIAGNOSIS — Z87891 Personal history of nicotine dependence: Secondary | ICD-10-CM

## 2017-11-02 DIAGNOSIS — I4892 Unspecified atrial flutter: Secondary | ICD-10-CM | POA: Diagnosis not present

## 2017-11-02 DIAGNOSIS — E039 Hypothyroidism, unspecified: Secondary | ICD-10-CM | POA: Insufficient documentation

## 2017-11-02 DIAGNOSIS — D509 Iron deficiency anemia, unspecified: Secondary | ICD-10-CM | POA: Insufficient documentation

## 2017-11-02 DIAGNOSIS — Z950 Presence of cardiac pacemaker: Secondary | ICD-10-CM

## 2017-11-02 DIAGNOSIS — D500 Iron deficiency anemia secondary to blood loss (chronic): Secondary | ICD-10-CM

## 2017-11-02 DIAGNOSIS — Z79899 Other long term (current) drug therapy: Secondary | ICD-10-CM

## 2017-11-02 LAB — IRON AND TIBC
Iron: 22 ug/dL — ABNORMAL LOW (ref 42–163)
Saturation Ratios: 7 % — ABNORMAL LOW (ref 42–163)
TIBC: 328 ug/dL (ref 202–409)
UIBC: 305 ug/dL

## 2017-11-02 LAB — CBC WITH DIFFERENTIAL (CANCER CENTER ONLY)
Basophils Absolute: 0.1 10*3/uL (ref 0.0–0.1)
Basophils Relative: 3 %
Eosinophils Absolute: 0.1 10*3/uL (ref 0.0–0.5)
Eosinophils Relative: 3 %
HCT: 29.5 % — ABNORMAL LOW (ref 38.4–49.9)
Hemoglobin: 9.2 g/dL — ABNORMAL LOW (ref 13.0–17.1)
Lymphocytes Relative: 13 %
Lymphs Abs: 0.6 10*3/uL — ABNORMAL LOW (ref 0.9–3.3)
MCH: 26.4 pg — ABNORMAL LOW (ref 27.2–33.4)
MCHC: 31.3 g/dL — ABNORMAL LOW (ref 32.0–36.0)
MCV: 84.4 fL (ref 79.3–98.0)
Monocytes Absolute: 0.5 10*3/uL (ref 0.1–0.9)
Monocytes Relative: 10 %
Neutro Abs: 3.4 10*3/uL (ref 1.5–6.5)
Neutrophils Relative %: 71 %
Platelet Count: 135 10*3/uL — ABNORMAL LOW (ref 140–400)
RBC: 3.49 MIL/uL — ABNORMAL LOW (ref 4.20–5.82)
RDW: 19.3 % — ABNORMAL HIGH (ref 11.0–14.6)
WBC Count: 4.7 10*3/uL (ref 4.0–10.3)

## 2017-11-02 LAB — CMP (CANCER CENTER ONLY)
ALT: 16 U/L (ref 0–55)
AST: 18 U/L (ref 5–34)
Albumin: 3.5 g/dL (ref 3.5–5.0)
Alkaline Phosphatase: 107 U/L (ref 40–150)
Anion gap: 7 (ref 3–11)
BUN: 16 mg/dL (ref 7–26)
CO2: 27 mmol/L (ref 22–29)
Calcium: 9.3 mg/dL (ref 8.4–10.4)
Chloride: 108 mmol/L (ref 98–109)
Creatinine: 1.1 mg/dL (ref 0.70–1.30)
GFR, Est AFR Am: >60 mL/min (ref >60)
GFR, Estimated: 59 mL/min — ABNORMAL LOW (ref >60)
Glucose, Bld: 113 mg/dL (ref 70–140)
Potassium: 3.8 mmol/L (ref 3.5–5.1)
Sodium: 142 mmol/L (ref 136–145)
Total Bilirubin: 0.5 mg/dL (ref 0.2–1.2)
Total Protein: 6.6 g/dL (ref 6.4–8.3)

## 2017-11-02 LAB — RETICULOCYTES
RBC.: 3.47 MIL/uL — ABNORMAL LOW (ref 4.20–5.82)
Retic Count, Absolute: 62.5 10*3/uL (ref 34.8–93.9)
Retic Ct Pct: 1.8 % (ref 0.8–1.8)

## 2017-11-02 LAB — FERRITIN: Ferritin: 61 ng/mL (ref 22–316)

## 2017-11-02 NOTE — Telephone Encounter (Signed)
Scheduled appts AVS/Calendar printed per 4/9 los

## 2017-11-05 ENCOUNTER — Other Ambulatory Visit: Payer: Self-pay | Admitting: Cardiology

## 2017-11-10 ENCOUNTER — Inpatient Hospital Stay: Payer: Medicare Other

## 2017-11-10 VITALS — BP 147/62 | HR 60 | Temp 98.1°F | Resp 16

## 2017-11-10 DIAGNOSIS — D509 Iron deficiency anemia, unspecified: Secondary | ICD-10-CM | POA: Diagnosis not present

## 2017-11-10 DIAGNOSIS — D500 Iron deficiency anemia secondary to blood loss (chronic): Secondary | ICD-10-CM

## 2017-11-10 MED ORDER — SODIUM CHLORIDE 0.9 % IV SOLN
750.0000 mg | Freq: Once | INTRAVENOUS | Status: AC
  Administered 2017-11-10: 750 mg via INTRAVENOUS
  Filled 2017-11-10: qty 15

## 2017-11-10 MED ORDER — SODIUM CHLORIDE 0.9 % IV SOLN
INTRAVENOUS | Status: DC
  Administered 2017-11-10: 16:00:00 via INTRAVENOUS

## 2017-11-10 NOTE — Patient Instructions (Signed)
Ferric carboxymaltose injection What is this medicine? FERRIC CARBOXYMALTOSE (ferr-ik car-box-ee-mol-toes) is an iron complex. Iron is used to make healthy red blood cells, which carry oxygen and nutrients throughout the body. This medicine is used to treat anemia in people with chronic kidney disease or people who cannot take iron by mouth. This medicine may be used for other purposes; ask your health care provider or pharmacist if you have questions. COMMON BRAND NAME(S): Injectafer What should I tell my health care provider before I take this medicine? They need to know if you have any of these conditions: -anemia not caused by low iron levels -high levels of iron in the blood -liver disease -an unusual or allergic reaction to iron, other medicines, foods, dyes, or preservatives -pregnant or trying to get pregnant -breast-feeding How should I use this medicine? This medicine is for infusion into a vein. It is given by a health care professional in a hospital or clinic setting. Talk to your pediatrician regarding the use of this medicine in children. Special care may be needed. Overdosage: If you think you have taken too much of this medicine contact a poison control center or emergency room at once. NOTE: This medicine is only for you. Do not share this medicine with others. What if I miss a dose? It is important not to miss your dose. Call your doctor or health care professional if you are unable to keep an appointment. What may interact with this medicine? Do not take this medicine with any of the following medications: -deferoxamine -dimercaprol -other iron products This medicine may also interact with the following medications: -chloramphenicol -deferasirox This list may not describe all possible interactions. Give your health care provider a list of all the medicines, herbs, non-prescription drugs, or dietary supplements you use. Also tell them if you smoke, drink alcohol, or use  illegal drugs. Some items may interact with your medicine. What should I watch for while using this medicine? Visit your doctor or health care professional regularly. Tell your doctor if your symptoms do not start to get better or if they get worse. You may need blood work done while you are taking this medicine. You may need to follow a special diet. Talk to your doctor. Foods that contain iron include: whole grains/cereals, dried fruits, beans, or peas, leafy green vegetables, and organ meats (liver, kidney). What side effects may I notice from receiving this medicine? Side effects that you should report to your doctor or health care professional as soon as possible: -allergic reactions like skin rash, itching or hives, swelling of the face, lips, or tongue -breathing problems -changes in blood pressure -feeling faint or lightheaded, falls -flushing, sweating, or hot feelings Side effects that usually do not require medical attention (report to your doctor or health care professional if they continue or are bothersome): -changes in taste -constipation -dizziness -headache -nausea -pain, redness, or irritation at site where injected -vomiting This list may not describe all possible side effects. Call your doctor for medical advice about side effects. You may report side effects to FDA at 1-800-FDA-1088. Where should I keep my medicine? This drug is given in a hospital or clinic and will not be stored at home. NOTE: This sheet is a summary. It may not cover all possible information. If you have questions about this medicine, talk to your doctor, pharmacist, or health care provider.  2018 Elsevier/Gold Standard (2015-08-15 11:20:47)  

## 2017-11-18 ENCOUNTER — Inpatient Hospital Stay: Payer: Medicare Other

## 2017-11-18 VITALS — BP 119/60 | HR 60 | Temp 98.2°F | Resp 18

## 2017-11-18 DIAGNOSIS — D509 Iron deficiency anemia, unspecified: Secondary | ICD-10-CM | POA: Diagnosis not present

## 2017-11-18 DIAGNOSIS — D500 Iron deficiency anemia secondary to blood loss (chronic): Secondary | ICD-10-CM

## 2017-11-18 MED ORDER — SODIUM CHLORIDE 0.9 % IV SOLN
750.0000 mg | Freq: Once | INTRAVENOUS | Status: AC
  Administered 2017-11-18: 750 mg via INTRAVENOUS
  Filled 2017-11-18: qty 15

## 2017-11-18 NOTE — Patient Instructions (Signed)
Ferric carboxymaltose injection What is this medicine? FERRIC CARBOXYMALTOSE (ferr-ik car-box-ee-mol-toes) is an iron complex. Iron is used to make healthy red blood cells, which carry oxygen and nutrients throughout the body. This medicine is used to treat anemia in people with chronic kidney disease or people who cannot take iron by mouth. This medicine may be used for other purposes; ask your health care provider or pharmacist if you have questions. COMMON BRAND NAME(S): Injectafer What should I tell my health care provider before I take this medicine? They need to know if you have any of these conditions: -anemia not caused by low iron levels -high levels of iron in the blood -liver disease -an unusual or allergic reaction to iron, other medicines, foods, dyes, or preservatives -pregnant or trying to get pregnant -breast-feeding How should I use this medicine? This medicine is for infusion into a vein. It is given by a health care professional in a hospital or clinic setting. Talk to your pediatrician regarding the use of this medicine in children. Special care may be needed. Overdosage: If you think you have taken too much of this medicine contact a poison control center or emergency room at once. NOTE: This medicine is only for you. Do not share this medicine with others. What if I miss a dose? It is important not to miss your dose. Call your doctor or health care professional if you are unable to keep an appointment. What may interact with this medicine? Do not take this medicine with any of the following medications: -deferoxamine -dimercaprol -other iron products This medicine may also interact with the following medications: -chloramphenicol -deferasirox This list may not describe all possible interactions. Give your health care provider a list of all the medicines, herbs, non-prescription drugs, or dietary supplements you use. Also tell them if you smoke, drink alcohol, or use  illegal drugs. Some items may interact with your medicine. What should I watch for while using this medicine? Visit your doctor or health care professional regularly. Tell your doctor if your symptoms do not start to get better or if they get worse. You may need blood work done while you are taking this medicine. You may need to follow a special diet. Talk to your doctor. Foods that contain iron include: whole grains/cereals, dried fruits, beans, or peas, leafy green vegetables, and organ meats (liver, kidney). What side effects may I notice from receiving this medicine? Side effects that you should report to your doctor or health care professional as soon as possible: -allergic reactions like skin rash, itching or hives, swelling of the face, lips, or tongue -breathing problems -changes in blood pressure -feeling faint or lightheaded, falls -flushing, sweating, or hot feelings Side effects that usually do not require medical attention (report to your doctor or health care professional if they continue or are bothersome): -changes in taste -constipation -dizziness -headache -nausea -pain, redness, or irritation at site where injected -vomiting This list may not describe all possible side effects. Call your doctor for medical advice about side effects. You may report side effects to FDA at 1-800-FDA-1088. Where should I keep my medicine? This drug is given in a hospital or clinic and will not be stored at home. NOTE: This sheet is a summary. It may not cover all possible information. If you have questions about this medicine, talk to your doctor, pharmacist, or health care provider.  2018 Elsevier/Gold Standard (2015-08-15 11:20:47)  

## 2017-12-01 ENCOUNTER — Ambulatory Visit (INDEPENDENT_AMBULATORY_CARE_PROVIDER_SITE_OTHER): Payer: Medicare Other | Admitting: *Deleted

## 2017-12-01 ENCOUNTER — Telehealth: Payer: Self-pay | Admitting: Cardiology

## 2017-12-01 DIAGNOSIS — I495 Sick sinus syndrome: Secondary | ICD-10-CM | POA: Diagnosis not present

## 2017-12-01 NOTE — Telephone Encounter (Signed)
Spoke with pt and reminded pt of remote transmission that is due today. Pt verbalized understanding.   

## 2017-12-02 ENCOUNTER — Encounter: Payer: Self-pay | Admitting: Cardiology

## 2017-12-02 NOTE — Progress Notes (Signed)
Remote pacemaker transmission.   

## 2017-12-10 ENCOUNTER — Other Ambulatory Visit: Payer: Self-pay | Admitting: Cardiology

## 2017-12-29 LAB — CUP PACEART REMOTE DEVICE CHECK
Battery Impedance: 1851 Ohm
Battery Remaining Longevity: 30 mo
Battery Voltage: 2.74 V
Brady Statistic AP VP Percent: 95 %
Brady Statistic AP VS Percent: 5 %
Brady Statistic AS VP Percent: 0 %
Brady Statistic AS VS Percent: 0 %
Date Time Interrogation Session: 20190508210557
Implantable Lead Implant Date: 20120815
Implantable Lead Implant Date: 20120815
Implantable Lead Location: 753859
Implantable Lead Location: 753860
Implantable Lead Model: 5076
Implantable Lead Model: 5076
Implantable Pulse Generator Implant Date: 20120815
Lead Channel Impedance Value: 425 Ohm
Lead Channel Impedance Value: 480 Ohm
Lead Channel Pacing Threshold Amplitude: 1 V
Lead Channel Pacing Threshold Pulse Width: 0.4 ms
Lead Channel Setting Pacing Amplitude: 2.5 V
Lead Channel Setting Pacing Amplitude: 2.5 V
Lead Channel Setting Pacing Pulse Width: 0.4 ms
Lead Channel Setting Sensing Sensitivity: 2 mV

## 2018-01-05 NOTE — Progress Notes (Signed)
Cardiology Office Note  Date: 01/06/2018   ID: De Hollingshead, DOB 12/01/1928, MRN [id number]  PCP: Eber Hong, MD  Primary Cardiologist: Rozann Lesches, MD   Chief Complaint  Patient presents with  . Coronary Artery Disease  . Diastolic heart failure    History of Present Illness: [name] is a medically complex 84 y.o. male last seen in March.  He is here today with his wife for follow-up visit.  He reports no major change in overall condition, sedentary at baseline.  Still has trouble with frequent urination and poor control of his bladder.  He remains on diuretic therapy for treatment of diastolic heart failure, we discussed trying to take Lasix at 40 mg in the morning and 20 mg in the afternoon with additional doses for periodic weight gain.  He remains on Coumadin with follow-up per Dr. Brynda Greathouse.  He does not report any spontaneous bleeding problems.  Follow-up continues with Dr. Rayann Heman in the device clinic, Medtronic pacemaker in place.  Recent device check showed normal function.  He is in the process of being considered for elective surgery on his right wrist for excision of a cyst.  Anesthesia not yet determined.  I saw him earlier in the year for preoperative assessment prior to left inguinal hernia repair under general anesthesia which he did tolerate. I graded his perioperative risk as intermediate to high at that time based on medical complexity and substrate, and this would remain the case now although this particular operation overall should be lower risk in terms of physiologic stressors.  He might even be able to have a local block.  Past Medical History:  Diagnosis Date  . Anemia   . Arthritis   . Atrial flutter (HCC)    Amiodarone therapy, coumadin therapy, prior history of cardioversion and overdrive pacing  . Barrett's esophagus   . Benign prostatic hyperplasia   . Cardiomyopathy    Mixed ischemic and nonischemic with previous EF of 35-40%, most recently in  June 2009 normalized to 55-60%. Echo 2/11 EF 55-60%  . Carotid artery disease (HCC)    Less than 50% bilateral internal carotid artery stenosis by Doppler 2009  . Carpal tunnel syndrome   . Chronic diastolic (congestive) heart failure ([place])   . Coronary atherosclerosis of native coronary artery    Status post coronary bypass grafting  . 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 Endocarditis   . Essential hypertension, benign   . GERD (gastroesophageal reflux disease)    PMH  . Hiatal hernia   . HOH (hard of hearing)   . Hyperlipidemia   . Hypothyroidism   . Inguinal hernia    Left  . Internal hemorrhoids   . Iron deficiency anemia   . Obstructive sleep apnea   . Osteoporosis   . Paroxysmal atrial fibrillation (HCC)    Amiodarone therapy, status post Cox-Maze procedure but with recurrent atrial fibrillation while on amiodarone August 2012  . Presence of permanent cardiac pacemaker   . Symptomatic bradycardia   . Tubular adenoma of colon   . 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 Warfarin anticoagulation   . Wears glasses   . Wears partial dentures     Past Surgical History:  Procedure Laterality Date  . AORTIC VALVE REPLACEMENT    . CARPAL TUNNEL RELEASE  2009  . CATARACT EXTRACTION, BILATERAL  07/2011  .  COLONOSCOPY W/ BIOPSIES AND POLYPECTOMY    . CORONARY ARTERY BYPASS GRAFT  2008   x2  . HERNIA REPAIR  age 11  . INGUINAL HERNIA REPAIR Left 08/30/2017   Procedure: LEFT INGUINAL HERNIA REPAIR;  Surgeon: Rolm Bookbinder, MD;  Location: [place];  Service: General;  Laterality: Left;  GENERAL ETT AND TAP BLOCK  . INSERTION OF MESH Left 08/30/2017   Procedure: INSERTION OF MESH;  Surgeon: Rolm Bookbinder, MD;  Location: [place];  Service: General;  Laterality: Left;  GENERAL ETT AND TAP BLOCK  . KNEE ARTHROSCOPY    . PACEMAKER INSERTION  2009   Initially implanted at  [facility], extracted by Dr Lovena Le for endocarditis and reimplanted 8/12  . PATELLA FRACTURE SURGERY  age 37   screw placed and removed  . TEE WITHOUT CARDIOVERSION N/A 07/05/2017   Procedure: TRANSESOPHAGEAL ECHOCARDIOGRAM (TEE);  Surgeon: Sanda Klein, MD;  Location: [place];  Service: Cardiovascular;  Laterality: N/A;  . TONSILLECTOMY  1939  . TOTAL HIP ARTHROPLASTY      Current Outpatient Medications  Medication Sig Dispense Refill  . ALOE VERA JUICE PO Take 1 oz by mouth daily.     Marland Kitchen amiodarone (PACERONE) 200 MG tablet Take 0.5 tablets (100 mg total) by mouth daily. 45 tablet 3  . amLODipine (NORVASC) 5 MG tablet Take 5 mg by mouth daily.  3  . benazepril (LOTENSIN) 40 MG tablet Take 1 tablet (40 mg total) by mouth daily. 90 tablet 3  . carvedilol (COREG) 3.125 MG tablet TAKE 1 TABLET (3.125 MG TOTAL) BY MOUTH 2 (TWO) TIMES DAILY. 180 tablet 3  . docusate sodium (COLACE) 100 MG capsule Take 100 mg by mouth at bedtime.     . furosemide (LASIX) 40 MG tablet TAKE 2 TABLETS BY MOUTH IN THE MORNING AND TAKE 1 TAB EVERY EVEVING,MAY TAKE 1 EXTRA TAB IN AM. 100 tablet 2  . furosemide (LASIX) 40 MG tablet TAKE 2 TABLETS BY MOUTH IN THE MORNING AND TAKE 1 TAB EVERY EVEVING,MAY TAKE 1 EXTRA TAB IN AM. 360 tablet 2  . levothyroxine (SYNTHROID, LEVOTHROID) 50 MCG tablet Take 50 mcg by mouth daily before breakfast.    . Multiple Vitamins-Minerals (THEREMS M PO) Take 1 tablet by mouth daily.      . nitroGLYCERIN (NITROSTAT) 0.4 MG SL tablet Place 1 tablet (0.4 mg total) under the tongue every 5 (five) minutes as needed. Up to 3 doses, if no relief after 3rd dose, proceed to the ED for an evaluation. 25 tablet 3  . pantoprazole (PROTONIX) 40 MG tablet TAKE 1 TABLET BY MOUTH DAILY. 90 tablet 1  . potassium chloride SA (KLOR-CON M20) 20 MEQ tablet TAKE 20 MEQ BY MOUTH DAILY 90 tablet 3  . pravastatin (PRAVACHOL) 40 MG tablet TAKE 1 TABLET BY MOUTH EVERY EVENING (Patient taking differently: TAKE 40 MG BY  MOUTH EVERY EVENING) 90 tablet 2  . Probiotic Product (PROBIOTIC DAILY PO) Take 1 tablet by mouth daily.    . psyllium (METAMUCIL) 58.6 % packet Take 1 packet by mouth daily.    Marland Kitchen warfarin (COUMADIN) 2 MG tablet Take 2-3 mg by mouth See admin instructions. Take 2 mg by mouth daily on Monday, Wednesday, Friday and Sunday. Take 3 mg by mouth daily on all other days. Pt stops taking on 01/30 and 01/31 and starts Lovenox on 02/01     No current facility-administered medications for this visit.    Allergies:  Patient has no known allergies.   Social History:  The patient  reports that he quit smoking about 63 years ago. His smoking use included cigarettes. He started smoking about 73 years ago. He has a 2.40 pack-year smoking history. He has never used smokeless tobacco. He reports that he drinks alcohol. He reports that he does not use drugs.   ROS:  Please see the history of present illness. Otherwise, complete review of systems is positive for hearing loss.  All other systems are reviewed and negative.   Physical Exam: VS:  BP (!) 164/73   Pulse 66   Ht 5\' 6"  (1.676 m)   Wt 170 lb 12.8 oz (77.5 kg)   SpO2 94%   BMI 27.57 kg/m , BMI Body mass index is 27.57 kg/m.  Wt Readings from Last 3 Encounters:  01/06/18 170 lb 12.8 oz (77.5 kg)  11/02/17 171 lb 1.6 oz (77.6 kg)  09/28/17 168 lb (76.2 kg)    General: Elderly male seated in wheelchair, no distress.Marland Kitchen HEENT: Conjunctiva and lids normal, oropharynx clear. Neck: Supple, no elevated JVP or carotid bruits, no thyromegaly. Lungs: Clear to auscultation, nonlabored breathing at rest. Cardiac: Regular rate and rhythm, no S3, 2/6 systolic murmur. Abdomen: Soft, nontender, bowel sounds present. Extremities: Mild ankle and lower leg edema, distal pulses 2+. Skin: Warm and dry. Musculoskeletal: No kyphosis. Neuropsychiatric: Alert and oriented x3, affect grossly appropriate.  Hearing loss evident.  ECG: I personally reviewed the tracing from  07/03/2017 which showed dual-chamber pacing.  Recent Labwork: 07/03/2017: B Natriuretic Peptide 695.9; Magnesium 2.2 07/04/2017: TSH 2.303 11/02/2017: ALT 16; AST 18; BUN 16; Creatinine 1.10; Hemoglobin 9.2; Platelet Count 135; Potassium 3.8; Sodium 142   Other Studies Reviewed Today:  TEE 07/05/2017: Study Conclusions  - Left ventricle: There was mild concentric hypertrophy. Systolic function was normal. The estimated ejection fraction was in the range of 55% to 60%. Wall motion was normal; there were no regional wall motion abnormalities. - Aortic valve: A 21.0 cmCarpentier-Edwards pericardial bioprosthesis was present. The prosthesis had a normal range of motion. The sewing ring appeared normal. No evidence of vegetation. Valve area (VTI): 0.77 cm^2. Valve area (Vmax): 0.77 cm^2. Valve area (Vmean): 0.76 cm^2. - Mitral valve: Prior procedures included surgical repair. An annular ring prosthesis was present and functioning normally. No evidence of vegetation. There was mild to moderate regurgitation directed eccentrically and toward the septum. Mean gradient (D): 4 mm Hg at a heart rate of 70 bpm. Valve area by pressure half-time: 2 cm^2. Valve area by continuity equation (using LVOT flow): 1.29 cm^2. - Left atrium: The atrium was severely dilated. No evidence of thrombus in the atrial cavity or appendage. There was mildcontinuous spontaneous echo contrast (&quot;smoke&quot;) in the cavity and the appendage. There is only a remnant of the appendage, which appears to be surgically resected or clipped. - Right ventricle: The cavity size was mildly dilated. - Right atrium: The atrium was severely dilated. - Tricuspid valve: No evidence of vegetation. There was moderate regurgitation. - Pulmonic valve: No evidence of vegetation.  Impressions:  - The very high transvalvular gradients through the aortic valve prosthesis, reported on the  transthoracic study, are not confirmed on the current study, but the gradients are nevertheless abnormally high.  Recommendations: No evidence of vegetation or perivalvular abscess. The aortic valve gradients are lower than on the recent transthoracic study and may be underestimated due to poor Doppler beam alignment. Even so, there is a big discrepancy between the normal leaflet motion and the elevated gradients. This is likely explained  by a combination of patient valve mismatch (dimensionless index 0.23) and high cardiac output (e.g. anemia, infection, etc.).  Assessment and Plan:  1.  Chronic diastolic heart failure.  Recent weights have been stable.  He will try Lasix at 40 mg in the morning and 20 mg in the afternoon.  If weight increases by 2 pounds in 24 hours or 5 pounds in a week he should increase his morning dose as before.  Otherwise continue with current medical regimen.  2.  Paroxysmal atrial fibrillation.  He has had good rhythm control on low-dose amiodarone and remains on stroke prophylaxis with Coumadin.  3.  Sick sinus syndrome with Medtronic pacemaker in place.  He continues to follow with Dr. Rayann Heman in the device clinic, most recent device interrogation showed normal function.  4.  CAD status post CABG, no active angina symptoms.  5.  Status post bioprosthetic AVR and mitral valve repair.  Clinically stable.  6.  Preoperative cardiac assessment prior to planned elective right wrist cyst excision.  Type of anesthesia not yet chosen.  Question whether he might be a candidate for a local block.  He did tolerate general anesthesia with herniorrhaphy earlier in the year.  Overall perioperative cardiac risk is in the intermediate to high range based on his medical complexity and substrate, although does not preclude proceeding with what should be a lower risk operation.  He is on Coumadin which is followed by his PCP, would anticipate a Lovenox bridge as he transitions  off.  Otherwise, no specific cardiac testing planned at this time.  Current medicines were reviewed with the patient today.  Disposition: Follow-up in 3 months.  Signed, Satira Sark, MD, [facility] 01/06/2018 3:04 PM    [facility] [place], [place], [place] [address] Phone: [phone]; Fax: [phone]

## 2018-01-06 ENCOUNTER — Encounter: Payer: Self-pay | Admitting: Cardiology

## 2018-01-06 ENCOUNTER — Ambulatory Visit (INDEPENDENT_AMBULATORY_CARE_PROVIDER_SITE_OTHER): Payer: Medicare Other | Admitting: Cardiology

## 2018-01-06 VITALS — BP 164/73 | HR 66 | Ht 66.0 in | Wt 170.8 lb

## 2018-01-06 DIAGNOSIS — I38 Endocarditis, valve unspecified: Secondary | ICD-10-CM

## 2018-01-06 DIAGNOSIS — I5032 Chronic diastolic (congestive) heart failure: Secondary | ICD-10-CM | POA: Diagnosis not present

## 2018-01-06 DIAGNOSIS — I480 Paroxysmal atrial fibrillation: Secondary | ICD-10-CM | POA: Diagnosis not present

## 2018-01-06 DIAGNOSIS — Z950 Presence of cardiac pacemaker: Secondary | ICD-10-CM | POA: Diagnosis not present

## 2018-01-06 DIAGNOSIS — Z01810 Encounter for preprocedural cardiovascular examination: Secondary | ICD-10-CM | POA: Diagnosis not present

## 2018-01-06 NOTE — Patient Instructions (Signed)

## 2018-01-14 ENCOUNTER — Telehealth: Payer: Self-pay | Admitting: Internal Medicine

## 2018-01-14 NOTE — H&P (Addendum)
[name] is an 84 y.o. male.   Chief Complaint: RIGHT WRIST DORSAL GANGLION CYST  HPI: [name] is an 84 y/o right hand dominant male who has had a mass appear on the dorsal aspect of the right wrist about 3 or 4 months ago. He has had occasional aching in the hand as well as numbness, tingling, and stiffness.  An aspiration of the cyst was done in the office but it returned after a couple of weeks.  Discussed the reason and rationale for surgical intervention to remove the mass.  The patient is here today for surgery.  He denies any chest pain, shortness of breath, fever, chills, nausea, vomiting, or diarrhea.   Past Medical History:  Diagnosis Date  . Anemia   . Arthritis   . Atrial flutter (HCC)    Amiodarone therapy, coumadin therapy, prior history of cardioversion and overdrive pacing  . Barrett's esophagus   . Benign prostatic hyperplasia   . Cardiomyopathy    Mixed ischemic and nonischemic with previous EF of 35-40%, most recently in June 2009 normalized to 55-60%. Echo 2/11 EF 55-60%  . Carotid artery disease (HCC)    Less than 50% bilateral internal carotid artery stenosis by Doppler 2009  . Carpal tunnel syndrome   . Chronic diastolic (congestive) heart failure ([place])   . Coronary atherosclerosis of native coronary artery    Status post coronary bypass grafting  . Endocarditis    s/p PPM extraction by [name] 2012 endocarditis with enterococcus faecalis  . Endocarditis   . Essential hypertension, benign   . GERD (gastroesophageal reflux disease)    PMH  . Hiatal hernia   . HOH (hard of hearing)   . Hyperlipidemia   . Hypothyroidism   . Inguinal hernia    Left  . Internal hemorrhoids   . Iron deficiency anemia   . Obstructive sleep apnea   . Osteoporosis   . Paroxysmal atrial fibrillation (HCC)    Amiodarone therapy, status post Cox-Maze procedure but with recurrent atrial fibrillation while on amiodarone August 2012  . Presence of permanent cardiac  pacemaker   . Symptomatic bradycardia   . Tubular adenoma of colon   . 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 Warfarin anticoagulation   . Wears glasses   . Wears partial dentures     Past Surgical History:  Procedure Laterality Date  . AORTIC VALVE REPLACEMENT    . CARPAL TUNNEL RELEASE  2009  . CATARACT EXTRACTION, BILATERAL  07/2011  . COLONOSCOPY W/ BIOPSIES AND POLYPECTOMY    . CORONARY ARTERY BYPASS GRAFT  2008   x2  . HERNIA REPAIR  age 27  . INGUINAL HERNIA REPAIR Left 08/30/2017   Procedure: LEFT INGUINAL HERNIA REPAIR;  Surgeon: Riley Bookbinder, MD;  Location: [place];  Service: General;  Laterality: Left;  GENERAL ETT AND TAP BLOCK  . INSERTION OF MESH Left 08/30/2017   Procedure: INSERTION OF MESH;  Surgeon: Riley Bookbinder, MD;  Location: [place];  Service: General;  Laterality: Left;  GENERAL ETT AND TAP BLOCK  . KNEE ARTHROSCOPY    . PACEMAKER INSERTION  2009   [facility]ially implanted at [facility], extracted by [name] for endocarditis and reimplanted 8/12  . PATELLA FRACTURE SURGERY  age 66   screw placed and removed  . TEE WITHOUT CARDIOVERSION N/A 07/05/2017   Procedure: TRANSESOPHAGEAL ECHOCARDIOGRAM (TEE);  Surgeon: Riley Klein, MD;  Location: [place];  Service:  Cardiovascular;  Laterality: N/A;  . TONSILLECTOMY  1939  . TOTAL HIP ARTHROPLASTY      Family History  Problem Relation Age of Onset  . Coronary artery disease Father   . Other Father        Heart attack or stroke  . Cancer Father   . Colon cancer Mother   . Diabetes Neg Hx   . Hypertension Neg Hx   . Esophageal cancer Neg Hx   . Rectal cancer Neg Hx   . Stomach cancer Neg Hx    Social History:  reports that he quit smoking about 63 years ago. His smoking use included cigarettes. He started smoking about 73 years ago. He has a 2.40 pack-year smoking history. He has never used smokeless tobacco. He reports that he drinks alcohol. He reports  that he does not use drugs.  Allergies: No Known Allergies  No medications prior to admission.    No results found for this or any previous visit (from the past 48 hour(s)). No results found.  ROS NO RECENT ILLNESSES OR HOSPITALIZATIONS  There were no vitals taken for this visit. Physical Exam  General Appearance:  Alert, cooperative, no distress, appears stated age  Head:  Normocephalic, without obvious abnormality, atraumatic  Eyes:  Pupils equal, conjunctiva/corneas clear,         Throat: Lips, mucosa, and tongue normal; teeth and gums normal  Neck: No visible masses     Lungs:   respirations unlabored  Chest Wall:  No tenderness or deformity  Heart:  Regular rate and rhythm,  Abdomen:   Soft, non-tender,         Extremities: Right WRIST: MASS OVER FOURTH DORSAL COMPARTMENT, FINGERS WARM WELL PERFUSED GOOD DIGITAL MOBILITY NO WOUNDS   Pulses: 2+ and symmetric  Skin: Skin color, texture, turgor normal, no rashes or lesions     Neurologic: Normal    Assessment RIGHT WRIST DORSAL MASS, EXTENSOR TENOSYNOVITIS   Plan RIGHT WRIST DORSAL MASS EXCISION  WE ARE PLANNING SURGERY FOR YOUR UPPER EXTREMITY. THE RISKS AND BENEFITS OF SURGERY INCLUDE BUT NOT LIMITED TO BLEEDING INFECTION, DAMAGE TO NEARBY NERVES ARTERIES TENDONS, FAILURE OF SURGERY TO ACCOMPLISH ITS INTENDED GOALS, PERSISTENT SYMPTOMS AND NEED FOR FURTHER SURGICAL INTERVENTION. WITH THIS IN MIND WE WILL PROCEED. I HAVE DISCUSSED WITH THE PATIENT THE PRE AND POSTOPERATIVE REGIMEN AND THE DOS AND DON'TS. PT VOICED UNDERSTANDING AND INFORMED CONSENT SIGNED.  R/B/A DISCUSSED WITH PT IN OFFICE.  PT VOICED UNDERSTANDING OF PLAN CONSENT SIGNED DAY OF SURGERY PT SEEN AND EXAMINED PRIOR TO OPERATIVE PROCEDURE/DAY OF SURGERY SITE MARKED. QUESTIONS ANSWERED WILL GO HOME FOLLOWING SURGERY  Riley. Haim Becker Riley Guile MD  [name] 01/14/2018, 8:10 AM   01/26/2018 Riley [name]  R/B/A DISCUSSED WITH PT IN OFFICE.   PT VOICED UNDERSTANDING OF PLAN CONSENT SIGNED DAY OF SURGERY PT SEEN AND EXAMINED PRIOR TO OPERATIVE PROCEDURE/DAY OF SURGERY SITE MARKED. QUESTIONS ANSWERED WILL GO HOME FOLLOWING SURGERY

## 2018-01-14 NOTE — Telephone Encounter (Signed)
Pt's wife Tandy Gaw would like to speak with a nurse regarding pantoprazole. She has read about the side effects of medication if taken long term so would like to know if pt really needs to take it long term or if he could stop taking it.

## 2018-01-14 NOTE — Telephone Encounter (Signed)
Patient is asking to discontinue pantoprazole secondary to concerns over long term usage.  Please advise.  Thank you.

## 2018-01-17 ENCOUNTER — Telehealth: Payer: Self-pay

## 2018-01-17 NOTE — Telephone Encounter (Signed)
With his history of Barrett's esophagus, and history of slow, occult, GI bleeding if they wish to discontinue PPI, then I would substitute ranitidine 150 mg BID

## 2018-01-17 NOTE — Telephone Encounter (Signed)
I spoke with wife.  He will switch to Zantac 150 mg BID.

## 2018-01-25 ENCOUNTER — Other Ambulatory Visit: Payer: Self-pay

## 2018-01-25 ENCOUNTER — Other Ambulatory Visit: Payer: Self-pay | Admitting: Cardiology

## 2018-01-25 ENCOUNTER — Encounter (HOSPITAL_COMMUNITY): Payer: Self-pay | Admitting: *Deleted

## 2018-01-25 MED ORDER — NITROGLYCERIN 0.4 MG SL SUBL: 0.4000 mg | SUBLINGUAL_TABLET | SUBLINGUAL | 3 refills | Status: AC | PRN

## 2018-01-25 NOTE — Progress Notes (Signed)
Anesthesia Chart Review:  Case:  [id number] Date/Time:  01/26/18 1515   Procedure:  Right wrist dorsal mass excision (Right ) - 60 mins   Anesthesia type:  Choice   Pre-op diagnosis:  Right wrist dorsal mass   Location:  [facility] OR ROOM 09 / [place] [facility]   Surgeon:  Iran Planas, MD      DISCUSSION:84 yo male scheduled for above procedure. Pertinent Hx includes CAD (s/p CABG 2008 at [facility]), mixed ischemic and nonischemic cardiomyopathy, pacemaker ([employer]tronic), chronic diastolic CHF, valvular heart disease (s/p 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 2008 at [facility]), PAF and atrial flutter (s/p MAZE and LAA stapling), endocarditis (s/p pacemaker extraction and reinsertion 2012), carotid artery disease, HTN, hyperlipidemia, anemia, iron deficiency anemia, OSA, hypothyroidism, GERD. Hard of hearing. Former smoker.   - Hospitalized 12/8 - 07/06/17 for hypoxia (unknown trigger- O2 sats 89% on RA initially), CHF (received diuretics)  -Recent hernia surgery 01/31/2422 without complication   If no changes, I anticipate pt can proceed with surgery as scheduled.    VS: There were no vitals taken for this visit.  PROVIDERS: - PCP is Eber Hong, MD who advised lovenox bridge  "My recommendation would be to use Lovenox 80 mg subcu twice a day. This should start 3 days before the surgery, with the last dose 24 hours before surgery, and can be restarted 24 hours after surgery if approved by the surgeon. He should stay on the Lovenox for 5 days after restarting it after surgery.  He should stop the Coumadin 5 days before surgery, and restart it the night of surgery."(notes in care everywhere)    - Hematologist is Sullivan Lone, MD  - Cardiologist is Rozann Lesches, MD who saw pt 01/06/2018 noting "He is in the process of being considered for elective surgery on his right wrist for excision of a cyst.  Anesthesia not yet determined.  I saw him earlier in the year for preoperative assessment prior to left  inguinal hernia repair under general anesthesia which he did tolerate. I graded his perioperative risk as intermediate to high at that time based on medical complexity and substrate, and this would remain the case now although this particular operation overall should be lower risk in terms of physiologic stressors.  He might even be able to have a local block."  - EP cardiologist is Thompson Grayer, MD. Last office visit 06/04/17   LABS: Labs reviewed: Acceptable for surgery.  (all labs ordered are listed, but only abnormal results are displayed)  CMP Latest Ref Rng & Units 11/02/2017  Glucose 70 - 140 mg/dL 113  BUN 7 - 26 mg/dL 16  Creatinine 0.70 - 1.30 mg/dL 1.10  Sodium 136 - 145 mmol/L 142  Potassium 3.5 - 5.1 mmol/L 3.8  Chloride 98 - 109 mmol/L 108  CO2 22 - 29 mmol/L 27  Calcium 8.4 - 10.4 mg/dL 9.3  Total Protein 6.4 - 8.3 g/dL 6.6  Total Bilirubin 0.2 - 1.2 mg/dL 0.5  Alkaline Phos 40 - 150 U/L 107  AST 5 - 34 U/L 18  ALT 0 - 55 U/L 16   Lab Results  Component Value Date   WBC 4.7 11/02/2017   HGB 9.2 (L) 11/02/2017   HCT 29.5 (L) 11/02/2017   MCV 84.4 11/02/2017   PLT 135 (L) 11/02/2017   IMAGES: 1 view CXR 07/04/17:  1. No acute cardiopulmonary process. 2. Stable cardiomegaly.   EKG 07/03/17: AV sequential or dual chamber electronic pacemaker   CV: TEE  07/05/17:  - Left ventricle: There was mild concentric hypertrophy. Systolicfunction was normal. The estimated ejection fraction was in therange of 55% to 60%. Wall motion was normal; there were noregional wall motion abnormalities. - Aortic valve: A 21.0 cmCarpentier-Edwards pericardialbioprosthesis was present. The prosthesis had a normal range ofmotion. The sewing ring appeared normal. No evidence ofvegetation. Valve area (VTI): 0.77 cm^2. Valve area (Vmax): 0.77cm^2. Valve area (Vmean): 0.76 cm^2. - Mitral valve: Prior procedures included surgical repair. Anannular ring prosthesis was present and functioning  normally. Noevidence of vegetation. There was mild to moderate regurgitationdirected eccentrically and toward the septum. Mean gradient (D):4 mm Hg at a heart rate of 70 bpm. Valve area by pressurehalf-time: 2 cm^2. Valve area by continuity equation (using LVOT flow): 1.29 cm^2. - Left atrium: The atrium was severely dilated. No evidence ofthrombus in the atrial cavity or appendage. There wasmildcontinuous spontaneous echo contrast (&quot;smoke&quot;) in the cavityand the appendage. There is only a remnant of the appendage,which appears to be surgically resected or clipped. - Right ventricle: The cavity size was mildly dilated. - Right atrium: The atrium was severely dilated. - Tricuspid valve: No evidence of vegetation. There was moderate regurgitation. - Pulmonic valve: No evidence of vegetation. - Impressions: The very high transvalvular gradients through the aortic valve prosthesis, reported on the transthoracic study, are notconfirmed on the current study, but the gradients arenevertheless abnormally high.    Past Medical History:  Diagnosis Date  . Anemia   . Arthritis   . Atrial flutter (HCC)    Amiodarone therapy, coumadin therapy, prior history of cardioversion and overdrive pacing  . Barrett's esophagus   . Benign prostatic hyperplasia   . Cardiomyopathy    Mixed ischemic and nonischemic with previous EF of 35-40%, most recently in June 2009 normalized to 55-60%. Echo 2/11 EF 55-60%  . Carotid artery disease (HCC)    Less than 50% bilateral internal carotid artery stenosis by Doppler 2009  . Carpal tunnel syndrome   . Chronic diastolic (congestive) heart failure ([place])   . Coronary atherosclerosis of native coronary artery    Status post coronary bypass grafting  . 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 Endocarditis   . Essential hypertension, benign   . Ganglion cyst    right wrist  . GERD  (gastroesophageal reflux disease)    PMH  . Hiatal hernia   . HOH (hard of hearing)   . Hyperlipidemia   . Hypothyroidism   . Inguinal hernia    Left  . Internal hemorrhoids   . Iron deficiency anemia   . Obstructive sleep apnea   . Osteoporosis   . Paroxysmal atrial fibrillation (HCC)    Amiodarone therapy, status post Cox-Maze procedure but with recurrent atrial fibrillation while on amiodarone August 2012  . Presence of permanent cardiac pacemaker   . Symptomatic bradycardia   . Tubular adenoma of colon   . 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 Warfarin anticoagulation   . Wears glasses   . Wears partial dentures     Past Surgical History:  Procedure Laterality Date  . AORTIC VALVE REPLACEMENT    . CARPAL TUNNEL RELEASE  2009  . CATARACT EXTRACTION, BILATERAL  07/2011  . COLONOSCOPY W/ BIOPSIES AND POLYPECTOMY    . CORONARY ARTERY BYPASS GRAFT  2008   x2  . HERNIA REPAIR  age 32  . INGUINAL HERNIA REPAIR  Left 08/30/2017   Procedure: LEFT INGUINAL HERNIA REPAIR;  Surgeon: Rolm Bookbinder, MD;  Location: [place];  Service: General;  Laterality: Left;  GENERAL ETT AND TAP BLOCK  . INSERTION OF MESH Left 08/30/2017   Procedure: INSERTION OF MESH;  Surgeon: Rolm Bookbinder, MD;  Location: [place];  Service: General;  Laterality: Left;  GENERAL ETT AND TAP BLOCK  . KNEE ARTHROSCOPY    . PACEMAKER INSERTION  2009   Initially implanted at [facility], extracted by Dr Lovena Le for endocarditis and reimplanted 8/12  . PATELLA FRACTURE SURGERY  age 55   screw placed and removed  . TEE WITHOUT CARDIOVERSION N/A 07/05/2017   Procedure: TRANSESOPHAGEAL ECHOCARDIOGRAM (TEE);  Surgeon: Sanda Klein, MD;  Location: [place];  Service: Cardiovascular;  Laterality: N/A;  . TONSILLECTOMY  1939  . TOTAL HIP ARTHROPLASTY      MEDICATIONS: No current facility-administered medications for this encounter.    Marland Kitchen amiodarone (PACERONE) 200 MG  tablet  . amLODipine (NORVASC) 5 MG tablet  . benazepril (LOTENSIN) 40 MG tablet  . carvedilol (COREG) 3.125 MG tablet  . docusate sodium (COLACE) 100 MG capsule  . enoxaparin (LOVENOX) 80 MG/0.8ML injection  . furosemide (LASIX) 40 MG tablet  . levothyroxine (SYNTHROID, LEVOTHROID) 50 MCG tablet  . potassium chloride SA (KLOR-CON M20) 20 MEQ tablet  . pravastatin (PRAVACHOL) 40 MG tablet  . Probiotic Product (PROBIOTIC DAILY PO)  . psyllium (METAMUCIL) 58.6 % packet  . ranitidine (ZANTAC) 75 MG tablet  . furosemide (LASIX) 40 MG tablet  . Multiple Vitamins-Minerals (THEREMS M PO)  . nitroGLYCERIN (NITROSTAT) 0.4 MG SL tablet  . pantoprazole (PROTONIX) 40 MG tablet  . warfarin (COUMADIN) 2 MG tablet    Angelina, Neece [facility] Short Stay Center/Anesthesiology Phone [phone] 01/25/2018 10:40 AM

## 2018-01-25 NOTE — Telephone Encounter (Signed)
° °  1. Which medications need to be refilled? (please list name of each medication and dose if known)    nitroGLYCERIN (NITROSTAT) 0.4 MG SL tablet    2. Which pharmacy/location (including street and city if local pharmacy) is medication to be sent to?   [facility]    3. Do they need a 30 day or 90 day supply?

## 2018-01-25 NOTE — Progress Notes (Signed)
Pt SDW-Pre-op call completed by pt spouse [place] Spouse denies that pt C/O any acute cardiopulmonary issues. Spouse stated that pt had a nuclear stress test > 10 years ago. Spouse stated that pt last dose of Coumadin was Thursday, 01/20/18, Lovenox was started on 01/22/18 with last pre-op dose administered on 01/24/18 as instructed. Pt under the care of both Dr. Rayann Heman Cardiology (EP) and Dr. Domenic Polite, Cardiology. Spouse made aware to have pt stop taking vitamins, fish oil Probiotics and herbal medications Do not take any NSAIDs ie: Ibuprofen, Advil, Naproxen (Aleve), Motrin, BC and Goody Powder. Spouse denies that pt had recent labs. Peri-op prescription for pacemaker initiated; awaiting response. [employer] reps notified. Spouse verbalized understanding of all pre-op instructions. See anesthesia note.

## 2018-01-25 NOTE — Telephone Encounter (Signed)
rx sent

## 2018-01-26 ENCOUNTER — Ambulatory Visit (HOSPITAL_COMMUNITY)
Admission: RE | Admit: 2018-01-26 | Discharge: 2018-01-26 | Disposition: A | Discharge status: Home / Self Care | Payer: Medicare Other | Source: Ambulatory Visit | Attending: Orthopedic Surgery | Admitting: Orthopedic Surgery

## 2018-01-26 ENCOUNTER — Ambulatory Visit (HOSPITAL_COMMUNITY): Payer: Medicare Other | Admitting: Physician Assistant

## 2018-01-26 ENCOUNTER — Encounter (HOSPITAL_COMMUNITY)
Admission: RE | Disposition: A | Discharge status: Home / Self Care | Payer: Self-pay | Source: Ambulatory Visit | Attending: Orthopedic Surgery

## 2018-01-26 ENCOUNTER — Encounter (HOSPITAL_COMMUNITY): Payer: Self-pay | Admitting: Certified Registered Nurse Anesthetist

## 2018-01-26 DIAGNOSIS — Z950 Presence of cardiac pacemaker: Secondary | ICD-10-CM | POA: Diagnosis not present

## 2018-01-26 DIAGNOSIS — M810 Age-related osteoporosis without current pathological fracture: Secondary | ICD-10-CM | POA: Insufficient documentation

## 2018-01-26 DIAGNOSIS — I2510 Atherosclerotic heart disease of native coronary artery without angina pectoris: Secondary | ICD-10-CM | POA: Insufficient documentation

## 2018-01-26 DIAGNOSIS — K219 Gastro-esophageal reflux disease without esophagitis: Secondary | ICD-10-CM | POA: Insufficient documentation

## 2018-01-26 DIAGNOSIS — E785 Hyperlipidemia, unspecified: Secondary | ICD-10-CM | POA: Insufficient documentation

## 2018-01-26 DIAGNOSIS — Z87891 Personal history of nicotine dependence: Secondary | ICD-10-CM | POA: Insufficient documentation

## 2018-01-26 DIAGNOSIS — I480 Paroxysmal atrial fibrillation: Secondary | ICD-10-CM | POA: Diagnosis not present

## 2018-01-26 DIAGNOSIS — Z7901 Long term (current) use of anticoagulants: Secondary | ICD-10-CM | POA: Insufficient documentation

## 2018-01-26 DIAGNOSIS — E039 Hypothyroidism, unspecified: Secondary | ICD-10-CM | POA: Diagnosis not present

## 2018-01-26 DIAGNOSIS — M1990 Unspecified osteoarthritis, unspecified site: Secondary | ICD-10-CM | POA: Diagnosis not present

## 2018-01-26 DIAGNOSIS — Z96649 Presence of unspecified artificial hip joint: Secondary | ICD-10-CM | POA: Insufficient documentation

## 2018-01-26 DIAGNOSIS — G4733 Obstructive sleep apnea (adult) (pediatric): Secondary | ICD-10-CM | POA: Insufficient documentation

## 2018-01-26 DIAGNOSIS — I110 Hypertensive heart disease with heart failure: Secondary | ICD-10-CM | POA: Insufficient documentation

## 2018-01-26 DIAGNOSIS — I5032 Chronic diastolic (congestive) heart failure: Secondary | ICD-10-CM | POA: Insufficient documentation

## 2018-01-26 DIAGNOSIS — Z8249 Family history of ischemic heart disease and other diseases of the circulatory system: Secondary | ICD-10-CM | POA: Diagnosis not present

## 2018-01-26 DIAGNOSIS — I429 Cardiomyopathy, unspecified: Secondary | ICD-10-CM | POA: Diagnosis not present

## 2018-01-26 DIAGNOSIS — M65831 Other synovitis and tenosynovitis, right forearm: Secondary | ICD-10-CM | POA: Insufficient documentation

## 2018-01-26 DIAGNOSIS — M67431 Ganglion, right wrist: Secondary | ICD-10-CM

## 2018-01-26 DIAGNOSIS — I6523 Occlusion and stenosis of bilateral carotid arteries: Secondary | ICD-10-CM | POA: Insufficient documentation

## 2018-01-26 DIAGNOSIS — I255 Ischemic cardiomyopathy: Secondary | ICD-10-CM | POA: Diagnosis not present

## 2018-01-26 HISTORY — PX: MASS EXCISION: SHX2000

## 2018-01-26 HISTORY — DX: Ganglion, unspecified site: M67.40

## 2018-01-26 LAB — BASIC METABOLIC PANEL
Anion gap: 10 (ref 5–15)
BUN: 14 mg/dL (ref 8–23)
CO2: 22 mmol/L (ref 22–32)
Calcium: 9.3 mg/dL (ref 8.9–10.3)
Chloride: 109 mmol/L (ref 98–111)
Creatinine, Ser: 0.92 mg/dL (ref 0.61–1.24)
GFR calc Af Amer: >60 mL/min (ref >60)
GFR calc non Af Amer: >60 mL/min (ref >60)
Glucose, Bld: 82 mg/dL (ref 70–99)
Potassium: 4.3 mmol/L (ref 3.5–5.1)
Sodium: 141 mmol/L (ref 135–145)

## 2018-01-26 LAB — CBC
HCT: 37.9 % — ABNORMAL LOW (ref 39.0–52.0)
Hemoglobin: 11.4 g/dL — ABNORMAL LOW (ref 13.0–17.0)
MCH: 27.2 pg (ref 26.0–34.0)
MCHC: 30.1 g/dL (ref 30.0–36.0)
MCV: 90.5 fL (ref 78.0–100.0)
Platelets: 162 10*3/uL (ref 150–400)
RBC: 4.19 MIL/uL — ABNORMAL LOW (ref 4.22–5.81)
RDW: 18.5 % — ABNORMAL HIGH (ref 11.5–15.5)
WBC: 4.8 10*3/uL (ref 4.0–10.5)

## 2018-01-26 LAB — PROTIME-INR
INR: 1.25
Prothrombin Time: 15.6 seconds — ABNORMAL HIGH (ref 11.4–15.2)

## 2018-01-26 SURGERY — EXCISION MASS
Anesthesia: General | Laterality: Right

## 2018-01-26 MED ORDER — LIDOCAINE 2% (20 MG/ML) 5 ML SYRINGE
INTRAMUSCULAR | Status: AC
  Filled 2018-01-26: qty 5

## 2018-01-26 MED ORDER — LACTATED RINGERS IV SOLN
INTRAVENOUS | Status: DC
  Administered 2018-01-26: 50 mL/h via INTRAVENOUS

## 2018-01-26 MED ORDER — 0.9 % SODIUM CHLORIDE (POUR BTL) OPTIME
TOPICAL | Status: DC | PRN
  Administered 2018-01-26: 1000 mL

## 2018-01-26 MED ORDER — BUPIVACAINE-EPINEPHRINE (PF) 0.25% -1:200000 IJ SOLN
INTRAMUSCULAR | Status: AC
  Filled 2018-01-26: qty 30

## 2018-01-26 MED ORDER — PROMETHAZINE HCL 25 MG/ML IJ SOLN: 6.2500 mg | INTRAMUSCULAR | Status: DC | PRN

## 2018-01-26 MED ORDER — BUPIVACAINE HCL (PF) 0.25 % IJ SOLN
INTRAMUSCULAR | Status: DC | PRN
  Administered 2018-01-26: 6 mL

## 2018-01-26 MED ORDER — OXYCODONE HCL 5 MG/5ML PO SOLN: 5.0000 mg | Freq: Once | ORAL | Status: DC | PRN

## 2018-01-26 MED ORDER — SODIUM CHLORIDE 0.9 % IR SOLN
Status: DC | PRN
  Administered 2018-01-26: 3000 mL

## 2018-01-26 MED ORDER — CEFAZOLIN SODIUM-DEXTROSE 2-4 GM/100ML-% IV SOLN
INTRAVENOUS | Status: AC
  Filled 2018-01-26: qty 100

## 2018-01-26 MED ORDER — PHENYLEPHRINE 40 MCG/ML (10ML) SYRINGE FOR IV PUSH (FOR BLOOD PRESSURE SUPPORT)
PREFILLED_SYRINGE | INTRAVENOUS | Status: AC
  Filled 2018-01-26: qty 10

## 2018-01-26 MED ORDER — BUPIVACAINE HCL (PF) 0.25 % IJ SOLN
INTRAMUSCULAR | Status: AC
  Filled 2018-01-26: qty 30

## 2018-01-26 MED ORDER — FENTANYL CITRATE (PF) 250 MCG/5ML IJ SOLN
INTRAMUSCULAR | Status: AC
  Filled 2018-01-26: qty 5

## 2018-01-26 MED ORDER — HYDROMORPHONE HCL 1 MG/ML IJ SOLN: 0.2500 mg | INTRAMUSCULAR | Status: DC | PRN

## 2018-01-26 MED ORDER — PHENYLEPHRINE 40 MCG/ML (10ML) SYRINGE FOR IV PUSH (FOR BLOOD PRESSURE SUPPORT)
PREFILLED_SYRINGE | INTRAVENOUS | Status: DC | PRN
  Administered 2018-01-26 (×2): 40 ug via INTRAVENOUS

## 2018-01-26 MED ORDER — PROPOFOL 10 MG/ML IV BOLUS
INTRAVENOUS | Status: AC
  Filled 2018-01-26: qty 20

## 2018-01-26 MED ORDER — CEFAZOLIN SODIUM-DEXTROSE 2-4 GM/100ML-% IV SOLN
2.0000 g | INTRAVENOUS | Status: AC
  Administered 2018-01-26: 2 g via INTRAVENOUS

## 2018-01-26 MED ORDER — CHLORHEXIDINE GLUCONATE 4 % EX LIQD: 60.0000 mL | Freq: Once | CUTANEOUS | Status: DC

## 2018-01-26 MED ORDER — LIDOCAINE 2% (20 MG/ML) 5 ML SYRINGE
INTRAMUSCULAR | Status: DC | PRN
  Administered 2018-01-26: 100 mg via INTRAVENOUS

## 2018-01-26 MED ORDER — FENTANYL CITRATE (PF) 100 MCG/2ML IJ SOLN
INTRAMUSCULAR | Status: DC | PRN
  Administered 2018-01-26: 50 ug via INTRAVENOUS
  Administered 2018-01-26: 25 ug via INTRAVENOUS

## 2018-01-26 MED ORDER — ONDANSETRON HCL 4 MG/2ML IJ SOLN
INTRAMUSCULAR | Status: AC
  Filled 2018-01-26: qty 4

## 2018-01-26 MED ORDER — ONDANSETRON HCL 4 MG/2ML IJ SOLN
INTRAMUSCULAR | Status: DC | PRN
  Administered 2018-01-26: 4 mg via INTRAVENOUS

## 2018-01-26 MED ORDER — OXYCODONE HCL 5 MG PO TABS: 5.0000 mg | ORAL_TABLET | Freq: Once | ORAL | Status: DC | PRN

## 2018-01-26 MED ORDER — PROPOFOL 10 MG/ML IV BOLUS
INTRAVENOUS | Status: DC | PRN
  Administered 2018-01-26: 120 mg via INTRAVENOUS

## 2018-01-26 SURGICAL SUPPLY — 55 items
BANDAGE ACE 3X5.8 VEL STRL LF (GAUZE/BANDAGES/DRESSINGS) ×2 IMPLANT
BANDAGE ACE 4X5 VEL STRL LF (GAUZE/BANDAGES/DRESSINGS) ×2 IMPLANT
BNDG CMPR 9X4 STRL LF SNTH (GAUZE/BANDAGES/DRESSINGS) ×1
BNDG COHESIVE 1X5 TAN STRL LF (GAUZE/BANDAGES/DRESSINGS) IMPLANT
BNDG CONFORM 2 STRL LF (GAUZE/BANDAGES/DRESSINGS) IMPLANT
BNDG ESMARK 4X9 LF (GAUZE/BANDAGES/DRESSINGS) ×2 IMPLANT
BNDG GAUZE ELAST 4 BULKY (GAUZE/BANDAGES/DRESSINGS) ×2 IMPLANT
CORDS BIPOLAR (ELECTRODE) ×2 IMPLANT
COVER SURGICAL LIGHT HANDLE (MISCELLANEOUS) ×2 IMPLANT
CUFF TOURNIQUET SINGLE 18IN (TOURNIQUET CUFF) ×2 IMPLANT
CUFF TOURNIQUET SINGLE 24IN (TOURNIQUET CUFF) IMPLANT
DRAIN PENROSE 1/4X12 LTX STRL (WOUND CARE) IMPLANT
DRAPE SURG 17X23 STRL (DRAPES) ×2 IMPLANT
DRSG ADAPTIC 3X8 NADH LF (GAUZE/BANDAGES/DRESSINGS) ×2 IMPLANT
ELECT REM PT RETURN 9FT ADLT (ELECTROSURGICAL)
ELECTRODE REM PT RTRN 9FT ADLT (ELECTROSURGICAL) IMPLANT
GAUZE SPONGE 4X4 12PLY STRL (GAUZE/BANDAGES/DRESSINGS) ×2 IMPLANT
GAUZE XEROFORM 1X8 LF (GAUZE/BANDAGES/DRESSINGS) ×2 IMPLANT
GAUZE XEROFORM 5X9 LF (GAUZE/BANDAGES/DRESSINGS) IMPLANT
GLOVE BIOGEL PI IND STRL 8.5 (GLOVE) ×1 IMPLANT
GLOVE BIOGEL PI INDICATOR 8.5 (GLOVE) ×1
GLOVE SURG ORTHO 8.0 STRL STRW (GLOVE) ×2 IMPLANT
GOWN STRL REUS W/ TWL LRG LVL3 (GOWN DISPOSABLE) ×3 IMPLANT
GOWN STRL REUS W/ TWL XL LVL3 (GOWN DISPOSABLE) ×1 IMPLANT
GOWN STRL REUS W/TWL LRG LVL3 (GOWN DISPOSABLE) ×6
GOWN STRL REUS W/TWL XL LVL3 (GOWN DISPOSABLE) ×2
HANDPIECE INTERPULSE COAX TIP (DISPOSABLE)
KIT BASIN OR (CUSTOM PROCEDURE TRAY) ×2 IMPLANT
KIT TURNOVER KIT B (KITS) ×2 IMPLANT
MANIFOLD NEPTUNE II (INSTRUMENTS) ×2 IMPLANT
NDL HYPO 25GX1X1/2 BEV (NEEDLE) IMPLANT
NEEDLE HYPO 25GX1X1/2 BEV (NEEDLE) IMPLANT
NS IRRIG 1000ML POUR BTL (IV SOLUTION) ×2 IMPLANT
PACK ORTHO EXTREMITY (CUSTOM PROCEDURE TRAY) ×2 IMPLANT
PAD ARMBOARD 7.5X6 YLW CONV (MISCELLANEOUS) ×4 IMPLANT
PAD CAST 4YDX4 CTTN HI CHSV (CAST SUPPLIES) ×1 IMPLANT
PADDING CAST COTTON 4X4 STRL (CAST SUPPLIES) ×4
SET CYSTO W/LG BORE CLAMP LF (SET/KITS/TRAYS/PACK) IMPLANT
SET HNDPC FAN SPRY TIP SCT (DISPOSABLE) IMPLANT
SOAP 2 % CHG 4 OZ (WOUND CARE) ×2 IMPLANT
SPLINT FIBERGLASS 3X35 (CAST SUPPLIES) ×1 IMPLANT
SPONGE LAP 18X18 X RAY DECT (DISPOSABLE) ×2 IMPLANT
SPONGE LAP 4X18 RFD (DISPOSABLE) ×2 IMPLANT
SUT ETHILON 4 0 PS 2 18 (SUTURE) IMPLANT
SUT ETHILON 5 0 P 3 18 (SUTURE)
SUT NYLON ETHILON 5-0 P-3 1X18 (SUTURE) IMPLANT
SWAB COLLECTION DEVICE MRSA (MISCELLANEOUS) ×2 IMPLANT
SWAB CULTURE ESWAB REG 1ML (MISCELLANEOUS) IMPLANT
SYR CONTROL 10ML LL (SYRINGE) IMPLANT
TOWEL OR 17X24 6PK STRL BLUE (TOWEL DISPOSABLE) ×2 IMPLANT
TOWEL OR 17X26 10 PK STRL BLUE (TOWEL DISPOSABLE) ×1 IMPLANT
TUBE CONNECTING 12X1/4 (SUCTIONS) ×2 IMPLANT
UNDERPAD 30X30 (UNDERPADS AND DIAPERS) ×2 IMPLANT
WATER STERILE IRR 1000ML POUR (IV SOLUTION) ×2 IMPLANT
YANKAUER SUCT BULB TIP NO VENT (SUCTIONS) ×1 IMPLANT

## 2018-01-26 NOTE — Anesthesia Preprocedure Evaluation (Signed)
Anesthesia Evaluation  Patient identified by MRN, date of birth, ID band Patient awake    Reviewed: Allergy & Precautions, NPO status , Patient's Chart, lab work & pertinent test results, reviewed documented beta blocker date and time   Airway Mallampati: II  TM Distance: >3 FB Neck ROM: Full    Dental  (+) Dental Advisory Given   Pulmonary sleep apnea , former smoker,    breath sounds clear to auscultation       Cardiovascular hypertension, Pt. on medications and Pt. on home beta blockers + CAD, + CABG, + Peripheral Vascular Disease and +CHF  + dysrhythmias Atrial Fibrillation + pacemaker + Valvular Problems/Murmurs (s/p AVR and MV repair. normal EF on 12/18 echo. Valve gradients wnl.)  Rhythm:Regular Rate:Normal     Neuro/Psych negative neurological ROS     GI/Hepatic Neg liver ROS, hiatal hernia, GERD  ,  Endo/Other  Hypothyroidism   Renal/GU negative Renal ROS     Musculoskeletal  (+) Arthritis ,   Abdominal   Peds  Hematology  (+) anemia ,   Anesthesia Other Findings   Reproductive/Obstetrics                             Lab Results  Component Value Date   WBC 4.7 11/02/2017   HGB 9.2 (L) 11/02/2017   HCT 29.5 (L) 11/02/2017   MCV 84.4 11/02/2017   PLT 135 (L) 11/02/2017   Lab Results  Component Value Date   CREATININE 1.10 11/02/2017   BUN 16 11/02/2017   NA 142 11/02/2017   K 3.8 11/02/2017   CL 108 11/02/2017   CO2 27 11/02/2017    Anesthesia Physical  Anesthesia Plan  ASA: III  Anesthesia Plan: General   Post-op Pain Management:    Induction: Intravenous  PONV Risk Score and Plan: 2 and Ondansetron and Treatment may vary due to age or medical condition  Airway Management Planned: LMA  Additional Equipment:   Intra-op Plan:   Post-operative Plan: Extubation in OR  Informed Consent: I have reviewed the patients History and Physical, chart, labs and  discussed the procedure including the risks, benefits and alternatives for the proposed anesthesia with the patient or authorized representative who has indicated his/her understanding and acceptance.   Dental advisory given  Plan Discussed with: CRNA  Anesthesia Plan Comments:         Anesthesia Quick Evaluation

## 2018-01-26 NOTE — Discharge Instructions (Signed)
KEEP BANDAGE CLEAN AND DRY CALL OFFICE FOR F/U APPT [phone] in 14 days DR [facility] CELL [phone] [facility] [address] [place] KEEP HAND ELEVATED ABOVE HEART OK TO APPLY ICE TO OPERATIVE AREA CONTACT OFFICE IF ANY WORSENING PAIN OR CONCERNS.

## 2018-01-26 NOTE — Transfer of Care (Signed)
Immediate Anesthesia Transfer of Care Note  Patient: [name]  Procedure(s) Performed: Right wrist dorsal mass excision (Right )  Patient Location: PACU  Anesthesia Type:General  Level of Consciousness: awake, alert , oriented and patient cooperative  Airway & Oxygen Therapy: Patient Spontanous Breathing and Patient connected to face mask oxygen  Post-op Assessment: Report given to RN, Post -op Vital signs reviewed and stable and Patient moving all extremities X 4  Post vital signs: Reviewed and stable  Last Vitals:  Vitals Value Taken Time  BP 136/72 01/26/2018  4:18 PM  Temp    Pulse 60 01/26/2018  4:20 PM  Resp 11 01/26/2018  4:20 PM  SpO2 92 % 01/26/2018  4:20 PM  Vitals shown include unvalidated device data.  Last Pain:  Vitals:   01/26/18 1339  TempSrc: Oral      Patients Stated Pain Goal: 3 (91/69/45 0388)  Complications: No apparent anesthesia complications

## 2018-01-26 NOTE — Anesthesia Procedure Notes (Signed)
Procedure Name: LMA Insertion Date/Time: 01/26/2018 3:11 PM Performed by: Colin Benton, CRNA Pre-anesthesia Checklist: Patient identified, Emergency Drugs available, Suction available and Patient being monitored Patient Re-evaluated:Patient Re-evaluated prior to induction Oxygen Delivery Method: Circle system utilized Preoxygenation: Pre-oxygenation with 100% oxygen Induction Type: IV induction Ventilation: Mask ventilation without difficulty LMA: LMA inserted LMA Size: 4.0 Number of attempts: 1 Placement Confirmation: positive ETCO2 Tube secured with: Tape

## 2018-01-26 NOTE — Op Note (Signed)
PREOPERATIVE DIAGNOSIS: Right wrist fourth dorsal compartment extensor tenosynovitis  POSTOPERATIVE DIAGNOSIS: Same  ATTENDING SURGEON: Dr. Gavin Pound who was scrubbed and present for the entire procedure  ASSISTANT SURGEON: Gertie Fey PA-C was scrubbed and necessary for the entire procedure helped aid in tenosynovectomy closure splinting in a timely fashion  ANESTHESIA: Gen. via LMA  OPERATIVE PROCEDURE: Right wrist tenosynovectomy extensor tendon with transposition of the dorsal retinaculum  IMPLANTS: None  RADIOGRAPHIC INTERPRETATION: None  SURGICAL INDICATIONS: Patient is a right-hand-dominant male with a persistent mass directly over the dorsal aspect of the right hand. Patient elected undergo the above procedure. Risks benefits and alternatives were discussed in detail with the patient in a signed informed consent was obtained. Risks include but not limited to bleeding infection damage to nearby nerves arteries or tendons loss of motion of the wrists and digits incomplete relief of symptoms and need for further surgical intervention.   SURGICAL TECHNIQUE: Patient is properly identified in the preoperative holding area marked for permanent marker made on the right wrist indicate correct operative site. Patient brought back Room placed supine on anesthesia and table where general anesthesia was administered. Patient tolerated this well. A well-padded tourniquet was then placed on the right brachium and sealed with the appropriate drape. The right upper extremities and prepped and draped in normal sterile fashion. Timeout was called the correct site was identified and the procedure then begun. Attention was then turned to the right wrist a longitudinal incision made directly over the fourth dorsal compartment. Dissection then carried down through the skin and subcutaneous tissue. The tourniquet had been insufflated. Circumferential dissection was then carried down around the fourth dorsal  compartment where the large synovial synovitis was then debrided. This was done of the entire fourth dorsal compartment. Radical tenosynovectomy carried out along the fourth dorsal compartment. What after aggressive tenosynovectomy portion the dorsal retinaculum was then transposed. The slotted tendons to be transposed dorsally. The wound was then thoroughly irrigated. After transposition in tenosynovectomy the wound was irrigated. Subcutaneous tissues were then closed with Monocryl. Skin closed with simple Prolene sutures. Adaptic dressing a sterile compressive bandage then applied. The patient is then placed in well-padded volar splint explained taken recovery room in good condition.  POSTOPERATIVE PLAN: Patient be discharged to home. Seen back in the office in 12 days for wound check likely suture removal transition to a short arm brace No radiographs the first visit.  Pathology: Dorsal mass to pathology.

## 2018-01-28 ENCOUNTER — Encounter (HOSPITAL_COMMUNITY): Payer: Self-pay | Admitting: Orthopedic Surgery

## 2018-01-31 NOTE — Anesthesia Postprocedure Evaluation (Signed)
Anesthesia Post Note  Patient: [name]  Procedure(s) Performed: Right wrist dorsal mass excision (Right )     Anesthesia Post Evaluation  Last Vitals:  Vitals:   01/26/18 1642 01/26/18 1649  BP: (!) 144/80 (!) 147/75  Pulse: (!) 59 60  Resp: 13 17  Temp: (!) 36.3 C   SpO2: 93% 93%    Last Pain:  Vitals:   01/26/18 1649  TempSrc:   PainSc: 0-No pain                 Nolon Nations

## 2018-02-01 ENCOUNTER — Ambulatory Visit: Payer: Medicare Other | Admitting: Hematology

## 2018-02-01 ENCOUNTER — Other Ambulatory Visit: Payer: Medicare Other

## 2018-02-18 NOTE — Progress Notes (Signed)
[facility]    HEMATOLOGY/ONCOLOGY CLINIC NOTE  Date of Service: 02/21/18   Patient Care Team: Eber Hong, MD as PCP - General (Internal Medicine) Satira Sark, MD as PCP - Cardiology (Cardiology) Zenovia Jarred MD (Gastroenterology)  CHIEF COMPLAINTS F/u for iron deficiency anemia  HISTORY OF PRESENTING ILLNESS:   [name] is a wonderful 84 y.o. male who has been referred to [place] by Dr .Eber Hong, MD  for evaluation and management of Iron deficiency Anemia  He has a history of multiple medical comorbidities including coronary artery disease, chronic systolic CHF, atrial fibrillation on Coumadin, peripheral vascular disease, permanent pacemaker placement, adenomatous colonic polyps and long segment Barrett's esophagus without significant dysplasia on chronic PPI therapy.  Patient has been noting increasing fatigue from earlier this year and was noted to have anemia with hemoglobin of 7.8 with an MCV of 69 in January 2017. Ferritin level was noted to be 19 with an iron saturation of 3%. Fecal occult blood testing in March was noted to be positive for occult GI bleeding.  Patient received IV Feraheme in March and July 2017 for iron replacement. He had an upper endoscopy and colonoscopy which were performed on 11/12/2015. EGD revealed 4 cm of Barrett's esophagus which was biopsied and found to be Barrett's without dysplasia. There was a 3 cm hiatal hernia. Normal stomach and normal duodenum. Duodenal biopsies were negative for celiac. Colonoscopy performed on the same day revealed 2 cecal polyps. 3 ascending colon polyps one of which was 14 mm and removed with hot snare. It was prophylactically endoclipped. The remainder of the examination was unremarkable. Internal hemorrhoids were seen. These polyps were found to be adenomas without high-grade dysplasia. Has not had a capsule endoscopy.  Patient's hemoglobin levels have been somewhat better in the mid to high 9 range but with persistent  iron deficiency and lack of normalization of his anemia.  He has been referred to [place] for further management of his iron deficiency anemia. He continues to be on Coumadin for his atrial fibrillation. He has been on oral iron preparation which is causing some constipation and darker stools. Reports no overt GI bleeding. His last INR was 2.3 yesterday as per his report. He monitors this with his primary care physician. Notes persistent fatigue which is likely multifactorial.  Is on levothyroxine replacement for hypothyroidism. Takes a daily multivitamin.  Patient reports that he notes his weight closely to avoid fluid overload from CHF and notes no weight loss.  No other evidence of overt bleeding. No hematuria no gum bleeds no epistaxis. Notes easy skin bruising. Not using any over-the-counter NSAIDs.   INTERVAL HISTORY   [name] Is here today for management and evaluation for Iron deficiency anemia. The patient's last visit with [place] was on 11/02/17. He is accompanied today by his wife. The pt reports that he is doing well overall.   The pt reports stable energy levels and has been taking a probiotic, has consumed lots of vegetables, uses stool softeners regularly and denies any concern for obvious blood in the stools. The pt continues on Coumadin in the setting of diverticulosis and a hiatal hernia.   Lab results today (02/21/18) of CBC w/diff, BMP, and Reticulocytes is as follows: all values are WNL except for RBC at 3.88, HGB at 10.5, HCT at 33.4, MCH at 27.1, MCHC at 31.4, RDW at 18.1, PLT at 108k, Lymphs abs at 700, Glucose at 109. Ferritin 02/21/18 is at 73 Iron/TIBC 02/21/18 shows all values WNL  except for 14% saturation ratio.  On review of systems, pt reports good energy levels, regular bowel movements, eating well, and denies bowel abnormalities, concern for obvious blood loss, abdominal pains, light headedness, dizziness, leg swelling, and any other symptoms.   MEDICAL HISTORY:    Past Medical History:  Diagnosis Date  . Anemia   . Arthritis   . Atrial flutter (HCC)    Amiodarone therapy, coumadin therapy, prior history of cardioversion and overdrive pacing  . Barrett's esophagus   . Benign prostatic hyperplasia   . Cardiomyopathy    Mixed ischemic and nonischemic with previous EF of 35-40%, most recently in June 2009 normalized to 55-60%. Echo 2/11 EF 55-60%  . Carotid artery disease (HCC)    Less than 50% bilateral internal carotid artery stenosis by Doppler 2009  . Carpal tunnel syndrome   . Chronic diastolic (congestive) heart failure ([place])   . Coronary atherosclerosis of native coronary artery    Status post coronary bypass grafting  . 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 Endocarditis   . Essential hypertension, benign   . Ganglion cyst    right wrist  . GERD (gastroesophageal reflux disease)    PMH  . Hiatal hernia   . HOH (hard of hearing)   . Hyperlipidemia   . Hypothyroidism   . Inguinal hernia    Left  . Internal hemorrhoids   . Iron deficiency anemia   . Obstructive sleep apnea   . Osteoporosis   . Paroxysmal atrial fibrillation (HCC)    Amiodarone therapy, status post Cox-Maze procedure but with recurrent atrial fibrillation while on amiodarone August 2012  . Presence of permanent cardiac pacemaker   . Symptomatic bradycardia   . Tubular adenoma of colon   . 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 Warfarin anticoagulation   . Wears glasses   . Wears partial dentures     SURGICAL HISTORY: Past Surgical History:  Procedure Laterality Date  . AORTIC VALVE REPLACEMENT    . CARPAL TUNNEL RELEASE  2009  . CATARACT EXTRACTION, BILATERAL  07/2011  . COLONOSCOPY W/ BIOPSIES AND POLYPECTOMY    . CORONARY ARTERY BYPASS GRAFT  2008   x2  . HERNIA REPAIR  age 62  . INGUINAL HERNIA REPAIR Left 08/30/2017   Procedure: LEFT INGUINAL  HERNIA REPAIR;  Surgeon: Rolm Bookbinder, MD;  Location: [place];  Service: General;  Laterality: Left;  GENERAL ETT AND TAP BLOCK  . INSERTION OF MESH Left 08/30/2017   Procedure: INSERTION OF MESH;  Surgeon: Rolm Bookbinder, MD;  Location: [place];  Service: General;  Laterality: Left;  GENERAL ETT AND TAP BLOCK  . KNEE ARTHROSCOPY    . MASS EXCISION Right 01/26/2018   Procedure: Right wrist dorsal mass excision;  Surgeon: Iran Planas, MD;  Location: [place];  Service: Orthopedics;  Laterality: Right;  60 mins  . PACEMAKER INSERTION  2009   Initially implanted at [facility], extracted by Dr Lovena Le for endocarditis and reimplanted 8/12  . PATELLA FRACTURE SURGERY  age 89   screw placed and removed  . TEE WITHOUT CARDIOVERSION N/A 07/05/2017   Procedure: TRANSESOPHAGEAL ECHOCARDIOGRAM (TEE);  Surgeon: Sanda Klein, MD;  Location: [place];  Service: Cardiovascular;  Laterality: N/A;  . TONSILLECTOMY  1939  . TOTAL HIP ARTHROPLASTY      SOCIAL HISTORY: Social History   Socioeconomic History  . Marital status:  Married    Spouse name: Not on file  . Number of children: 3  . Years of education: Not on file  . Highest education level: Not on file  Occupational History  . Occupation: Retired  Scientific laboratory technician  . Financial resource strain: Not on file  . Food insecurity:    Worry: Not on file    Inability: Not on file  . Transportation needs:    Medical: Not on file    Non-medical: Not on file  Tobacco Use  . Smoking status: Former Smoker    Packs/day: 0.30    Years: 8.00    Pack years: 2.40    Types: Cigarettes    Start date: 03/25/1944    Last attempt to quit: 07/27/1954    Years since quitting: 63.6  . Smokeless tobacco: Never Used  Substance and Sexual Activity  . Alcohol use: Yes    Alcohol/week: 0.0 oz    Comment: glass of wine occ  . Drug use: No  . Sexual activity: Not on file  Lifestyle  . Physical activity:    Days per week: Not on file    Minutes per session: Not on  file  . Stress: Not on file  Relationships  . Social connections:    Talks on phone: Not on file    Gets together: Not on file    Attends religious service: Not on file    Active member of club or organization: Not on file    Attends meetings of clubs or organizations: Not on file    Relationship status: Not on file  . Intimate partner violence:    Fear of current or ex partner: Not on file    Emotionally abused: Not on file    Physically abused: Not on file    Forced sexual activity: Not on file  Other Topics Concern  . Not on file  Social History Narrative  . Not on file    FAMILY HISTORY: Family History  Problem Relation Age of Onset  . Coronary artery disease Father   . Other Father        Heart attack or stroke  . Cancer Father   . Colon cancer Mother   . Diabetes Neg Hx   . Hypertension Neg Hx   . Esophageal cancer Neg Hx   . Rectal cancer Neg Hx   . Stomach cancer Neg Hx     ALLERGIES:  has No Known Allergies.  MEDICATIONS:  Current Outpatient Medications  Medication Sig Dispense Refill  . amiodarone (PACERONE) 200 MG tablet Take 0.5 tablets (100 mg total) by mouth daily. 45 tablet 3  . amLODipine (NORVASC) 5 MG tablet Take 5 mg by mouth daily.  3  . benazepril (LOTENSIN) 40 MG tablet Take 1 tablet (40 mg total) by mouth daily. 90 tablet 3  . carvedilol (COREG) 3.125 MG tablet TAKE 1 TABLET (3.125 MG TOTAL) BY MOUTH 2 (TWO) TIMES DAILY. 180 tablet 3  . docusate sodium (COLACE) 100 MG capsule Take 100 mg by mouth at bedtime.     . enoxaparin (LOVENOX) 80 MG/0.8ML injection INJECT 1 SYRINGE SUBCUTANEOUSLY TWICE A DAY FOR A TOTAL OF 15 DOSES AS A BRIDGE FOR SURGERY  0  . furosemide (LASIX) 40 MG tablet TAKE 2 TABLETS BY MOUTH IN THE MORNING AND TAKE 1 TAB EVERY EVEVING,MAY TAKE 1 EXTRA TAB IN AM. (Patient taking differently: TAKE 60MG  BY MOUTH IN THE MORNING AND TAKE 20MG  TAB EVERY EVEVING,MAY TAKE 1 EXTRA TAB IN  AM.) 100 tablet 2  . furosemide (LASIX) 40 MG tablet  TAKE 2 TABLETS BY MOUTH IN THE MORNING AND TAKE 1 TAB EVERY EVEVING,MAY TAKE 1 EXTRA TAB IN AM. (Patient not taking: Reported on 01/24/2018) 360 tablet 2  . levothyroxine (SYNTHROID, LEVOTHROID) 50 MCG tablet Take 50 mcg by mouth daily before breakfast.    . Multiple Vitamins-Minerals (THEREMS M PO) Take 1 tablet by mouth daily.      . nitroGLYCERIN (NITROSTAT) 0.4 MG SL tablet Place 1 tablet (0.4 mg total) under the tongue every 5 (five) minutes as needed. Up to 3 doses, if no relief after 3rd dose, proceed to the ED for an evaluation. 25 tablet 3  . pantoprazole (PROTONIX) 40 MG tablet TAKE 1 TABLET BY MOUTH DAILY. (Patient not taking: Reported on 01/24/2018) 90 tablet 1  . potassium chloride SA (KLOR-CON M20) 20 MEQ tablet TAKE 20 MEQ BY MOUTH DAILY 90 tablet 3  . pravastatin (PRAVACHOL) 40 MG tablet TAKE 1 TABLET BY MOUTH EVERY EVENING 90 tablet 2  . Probiotic Product (PROBIOTIC DAILY PO) Take 1 tablet by mouth daily.    . psyllium (METAMUCIL) 58.6 % packet Take 1 packet by mouth daily.    . ranitidine (ZANTAC) 75 MG tablet Take 75 mg by mouth daily before breakfast.    . warfarin (COUMADIN) 2 MG tablet Take 2-3 mg by mouth See admin instructions. Take 3 mg by mouth daily on Monday, Wednesday, Friday. Take 2 mg by mouth daily on all other days.     No current facility-administered medications for this visit.     REVIEW OF SYSTEMS:    A 10+ POINT REVIEW OF SYSTEMS WAS OBTAINED including neurology, dermatology, psychiatry, cardiac, respiratory, lymph, extremities, GI, GU, Musculoskeletal, constitutional, breasts, reproductive, HEENT.  All pertinent positives are noted in the HPI.  All others are negative.   PHYSICAL EXAMINATION:  ECOG PERFORMANCE STATUS: 3 - Symptomatic, >50% confined to bed  . Vitals:   02/21/18 1449  BP: 135/76  Pulse: 60  Resp: 18  Temp: 97.9 F (36.6 C)  SpO2: (!) 90%   Filed Weights   02/21/18 1449  Weight: 168 lb 6.4 oz (76.4 kg)   .Body mass index is 27.18  kg/m.  GENERAL:alert, in no acute distress and comfortable SKIN: no acute rashes, no significant lesions EYES: conjunctiva are pink and non-injected, sclera anicteric OROPHARYNX: MMM, no exudates, no oropharyngeal erythema or ulceration NECK: supple, no JVD LYMPH:  no palpable lymphadenopathy in the cervical, axillary or inguinal regions LUNGS: clear to auscultation b/l with normal respiratory effort HEART: regular rate & rhythm ABDOMEN:  normoactive bowel sounds , non tender, not distended. No palpable hepatosplenomegaly.  Extremity: no pedal edema PSYCH: alert & oriented x 3 with fluent speech NEURO: no focal motor/sensory deficits   LABORATORY DATA:  I have reviewed the data as listed . CBC Latest Ref Rng & Units 02/21/2018 01/26/2018 11/02/2017  WBC 4.0 - 10.3 K/uL 4.8 4.8 4.7  Hemoglobin 13.0 - 17.1 g/dL 10.5(L) 11.4(L) 9.2(L)  Hematocrit 38.4 - 49.9 % 33.4(L) 37.9(L) 29.5(L)  Platelets 140 - 400 K/uL 108(L) 162 135(L)    . CMP Latest Ref Rng & Units 02/21/2018 01/26/2018 11/02/2017  Glucose 70 - 99 mg/dL 109(H) 82 113  BUN 8 - 23 mg/dL 15 14 16   Creatinine 0.61 - 1.24 mg/dL 0.94 0.92 1.10  Sodium 135 - 145 mmol/L 139 141 142  Potassium 3.5 - 5.1 mmol/L 3.7 4.3 3.8  Chloride 98 - 111 mmol/L 104  109 108  CO2 22 - 32 mmol/L 29 22 27   Calcium 8.9 - 10.3 mg/dL 9.3 9.3 9.3  Total Protein 6.4 - 8.3 g/dL - - 6.6  Total Bilirubin 0.2 - 1.2 mg/dL - - 0.5  Alkaline Phos 40 - 150 U/L - - 107  AST 5 - 34 U/L - - 18  ALT 0 - 55 U/L - - 16    . Lab Results  Component Value Date   IRON 44 02/21/2018   TIBC 315 02/21/2018   IRONPCTSAT 14 (L) 02/21/2018   (Iron and TIBC)  Lab Results  Component Value Date   FERRITIN 73 02/21/2018    EGD 11/12/2015 - Esophageal mucosal changes suggestive of long-segment Barrett's esophagus. Biopsied. - 3 cm hiatal hernia. - Normal stomach. - Normal examined duodenum. Biopsied.  Colonoscopy 11/12/2015 - Two 4 to 5 mm polyps in the cecum, removed  with a cold snare. Resected and retrieved. - Two 4 to 5 mm polyps in the ascending colon, removed with a cold snare. Resected and Retrieved - One 14 mm polyp in the ascending colon, removed with a hot snare. Resected and retrieved. Clip was placed. - Internal hemorrhoids.   RADIOGRAPHIC STUDIES: I have personally reviewed the radiological images as listed and agreed with the findings in the report. No results found.  ASSESSMENT & PLAN:   84 y.o.Caucasian male with multiple medical comorbidities with  1) Microcytic Anemia -predominantly related to iron deficiency from chronic GI losses.  Patient has been on chronic anticoagulation for his atrial fibrillation and this will continue to be a risk factor from going GI losses. He has received IV Feraheme 2 in March and July 2017 without complete correction of his iron deficiency. This is suggestive of ongoing GI losses. Initial workup as noted below showed no monoclonal protein. No evidence of hemolysis with normal LDH and haptoglobin. Patient had IV Injectafer 750mg  qweekly x2 doses on 05/22/2016 and 05/29/2016. He has multiple potential sources of GI bleeding especially when on Coumadin.  PLAN:  -Discussed pt labwork today, 02/21/18; HGB decreased slightly to 10.5, PLT at 108k, Ferritin at 73 -Suspect ongoing GI blood loss as pt's ferritin has decreased  -Goal for Ferritin >100 -Will set pt up for IV Injectafer x2 doses over two weeks -Will see pt back in 4 months   2) Long segment Barrett's esophagus without dysplasia.  3) Hiatal hernia  4) multiple adenomatous colonic polyps.  PLAN:  -IV injectafer weekly x 2 doses -Patient was counseled to have a discussion with his gastroenterologist, primary care physician and cardiologist regarding the risk versus benefit of ongoing anticoagulation in the setting of ongoing GI bleeding.  5). Patient Active Problem List   Diagnosis Date Noted  . Inguinal hernia 08/30/2017  . Bacteremia     . Hypoxia 07/03/2017  . Iron deficiency anemia due to chronic blood loss 05/14/2016  . Barrett's esophagus without dysplasia 01/22/2016  . Anemia 08/01/2015  . Symptomatic anemia 08/01/2015  . Chronic diastolic CHF (congestive heart failure) ([place]) 08/01/2015  . Sick sinus syndrome ([place]) 03/20/2013  . Pacemaker 11/24/2011  . Warfarin anticoagulation   . Paroxysmal atrial fibrillation (HCC)   . Valvular heart disease   . Atrial flutter ([place])   . Carotid artery disease ([place])   . History of endocarditis   . HYPERLIPIDEMIA 10/10/2010  . Obstructive sleep apnea 11/01/2009  . Hypertensive cardiovascular disease 04/10/2009  . Coronary atherosclerosis of native coronary artery 04/10/2009  . Secondary cardiomyopathy ([place]) 04/10/2009   -  continue f/u with cardiology and PCP as per their recommendations.   IV injectafer weekly x 2 doses starting within 1-2 weeks RTC with Dr Irene Limbo in 4 months with labs   All of the patients questions were answered with apparent satisfaction. The patient knows to call the clinic with any problems, questions or concerns.  The total time spent in the appt was 20 minutes and more than 50% was on counseling and direct patient cares.  Sullivan Lone MD [place] AAHIVMS [facility] Hematology/Oncology Physician [facility]  (Office):       [phone] (Work cell):  [phone] (Fax):           [phone]  I, Baldwin [place], am acting as a scribe for Dr Irene Limbo.   .I have reviewed the above documentation for accuracy and completeness, and I agree with the above. Brunetta Genera MD

## 2018-02-21 ENCOUNTER — Inpatient Hospital Stay: Payer: Medicare Other | Attending: Hematology

## 2018-02-21 ENCOUNTER — Telehealth: Payer: Self-pay | Admitting: Hematology

## 2018-02-21 ENCOUNTER — Inpatient Hospital Stay (HOSPITAL_BASED_OUTPATIENT_CLINIC_OR_DEPARTMENT_OTHER): Payer: Medicare Other | Admitting: Hematology

## 2018-02-21 ENCOUNTER — Other Ambulatory Visit: Payer: Self-pay | Admitting: Hematology

## 2018-02-21 VITALS — BP 135/76 | HR 60 | Temp 97.9°F | Resp 18 | Ht 66.0 in | Wt 168.4 lb

## 2018-02-21 DIAGNOSIS — I480 Paroxysmal atrial fibrillation: Secondary | ICD-10-CM | POA: Diagnosis not present

## 2018-02-21 DIAGNOSIS — Z86010 Personal history of colonic polyps: Secondary | ICD-10-CM | POA: Insufficient documentation

## 2018-02-21 DIAGNOSIS — Z7901 Long term (current) use of anticoagulants: Secondary | ICD-10-CM

## 2018-02-21 DIAGNOSIS — Z800 Family history of malignant neoplasm of digestive organs: Secondary | ICD-10-CM

## 2018-02-21 DIAGNOSIS — K5900 Constipation, unspecified: Secondary | ICD-10-CM | POA: Diagnosis not present

## 2018-02-21 DIAGNOSIS — D500 Iron deficiency anemia secondary to blood loss (chronic): Secondary | ICD-10-CM

## 2018-02-21 DIAGNOSIS — Z87891 Personal history of nicotine dependence: Secondary | ICD-10-CM

## 2018-02-21 DIAGNOSIS — E039 Hypothyroidism, unspecified: Secondary | ICD-10-CM

## 2018-02-21 DIAGNOSIS — Z79899 Other long term (current) drug therapy: Secondary | ICD-10-CM

## 2018-02-21 DIAGNOSIS — K2270 Barrett's esophagus without dysplasia: Secondary | ICD-10-CM | POA: Insufficient documentation

## 2018-02-21 LAB — BASIC METABOLIC PANEL
Anion gap: 6 (ref 5–15)
BUN: 15 mg/dL (ref 8–23)
CO2: 29 mmol/L (ref 22–32)
Calcium: 9.3 mg/dL (ref 8.9–10.3)
Chloride: 104 mmol/L (ref 98–111)
Creatinine, Ser: 0.94 mg/dL (ref 0.61–1.24)
GFR calc Af Amer: >60 mL/min (ref >60)
GFR calc non Af Amer: >60 mL/min (ref >60)
Glucose, Bld: 109 mg/dL — ABNORMAL HIGH (ref 70–99)
Potassium: 3.7 mmol/L (ref 3.5–5.1)
Sodium: 139 mmol/L (ref 135–145)

## 2018-02-21 LAB — CBC WITH DIFFERENTIAL/PLATELET
Basophils Absolute: 0.1 10*3/uL (ref 0.0–0.1)
Basophils Relative: 2 %
Eosinophils Absolute: 0.1 10*3/uL (ref 0.0–0.5)
Eosinophils Relative: 3 %
HCT: 33.4 % — ABNORMAL LOW (ref 38.4–49.9)
Hemoglobin: 10.5 g/dL — ABNORMAL LOW (ref 13.0–17.1)
Lymphocytes Relative: 15 %
Lymphs Abs: 0.7 10*3/uL — ABNORMAL LOW (ref 0.9–3.3)
MCH: 27.1 pg — ABNORMAL LOW (ref 27.2–33.4)
MCHC: 31.4 g/dL — ABNORMAL LOW (ref 32.0–36.0)
MCV: 86.1 fL (ref 79.3–98.0)
Monocytes Absolute: 0.5 10*3/uL (ref 0.1–0.9)
Monocytes Relative: 10 %
Neutro Abs: 3.4 10*3/uL (ref 1.5–6.5)
Neutrophils Relative %: 70 %
Platelets: 108 10*3/uL — ABNORMAL LOW (ref 140–400)
RBC: 3.88 MIL/uL — ABNORMAL LOW (ref 4.20–5.82)
RDW: 18.1 % — ABNORMAL HIGH (ref 11.0–14.6)
WBC: 4.8 10*3/uL (ref 4.0–10.3)

## 2018-02-21 LAB — RETICULOCYTES
RBC.: 3.88 MIL/uL — ABNORMAL LOW (ref 4.20–5.82)
Retic Count, Absolute: 38.8 10*3/uL (ref 34.8–93.9)
Retic Ct Pct: 1 % (ref 0.8–1.8)

## 2018-02-21 LAB — IRON AND TIBC
Iron: 44 ug/dL (ref 42–163)
Saturation Ratios: 14 % — ABNORMAL LOW (ref 42–163)
TIBC: 315 ug/dL (ref 202–409)
UIBC: 271 ug/dL

## 2018-02-21 LAB — FERRITIN: Ferritin: 73 ng/mL (ref 24–336)

## 2018-02-21 NOTE — Telephone Encounter (Signed)
Scheduled appt per 7/29 los - gave patient AVS and calender per los.  

## 2018-02-28 ENCOUNTER — Inpatient Hospital Stay: Payer: Medicare Other | Attending: Hematology

## 2018-02-28 VITALS — BP 153/79 | HR 60 | Temp 98.5°F | Resp 18

## 2018-02-28 DIAGNOSIS — D500 Iron deficiency anemia secondary to blood loss (chronic): Secondary | ICD-10-CM | POA: Diagnosis not present

## 2018-02-28 MED ORDER — SODIUM CHLORIDE 0.9 % IV SOLN
Freq: Once | INTRAVENOUS | Status: AC
  Administered 2018-02-28: 15:00:00 via INTRAVENOUS
  Filled 2018-02-28: qty 250

## 2018-02-28 MED ORDER — FERRIC CARBOXYMALTOSE 750 MG/15ML IV SOLN
750.0000 mg | Freq: Once | INTRAVENOUS | Status: AC
  Administered 2018-02-28: 750 mg via INTRAVENOUS
  Filled 2018-02-28: qty 15

## 2018-02-28 NOTE — Patient Instructions (Signed)
Ferric carboxymaltose injection What is this medicine? FERRIC CARBOXYMALTOSE (ferr-ik car-box-ee-mol-toes) is an iron complex. Iron is used to make healthy red blood cells, which carry oxygen and nutrients throughout the body. This medicine is used to treat anemia in people with chronic kidney disease or people who cannot take iron by mouth. This medicine may be used for other purposes; ask your health care provider or pharmacist if you have questions. COMMON BRAND NAME(S): Injectafer What should I tell my health care provider before I take this medicine? They need to know if you have any of these conditions: -anemia not caused by low iron levels -high levels of iron in the blood -liver disease -an unusual or allergic reaction to iron, other medicines, foods, dyes, or preservatives -pregnant or trying to get pregnant -breast-feeding How should I use this medicine? This medicine is for infusion into a vein. It is given by a health care professional in a hospital or clinic setting. Talk to your pediatrician regarding the use of this medicine in children. Special care may be needed. Overdosage: If you think you have taken too much of this medicine contact a poison control center or emergency room at once. NOTE: This medicine is only for you. Do not share this medicine with others. What if I miss a dose? It is important not to miss your dose. Call your doctor or health care professional if you are unable to keep an appointment. What may interact with this medicine? Do not take this medicine with any of the following medications: -deferoxamine -dimercaprol -other iron products This medicine may also interact with the following medications: -chloramphenicol -deferasirox This list may not describe all possible interactions. Give your health care provider a list of all the medicines, herbs, non-prescription drugs, or dietary supplements you use. Also tell them if you smoke, drink alcohol, or use  illegal drugs. Some items may interact with your medicine. What should I watch for while using this medicine? Visit your doctor or health care professional regularly. Tell your doctor if your symptoms do not start to get better or if they get worse. You may need blood work done while you are taking this medicine. You may need to follow a special diet. Talk to your doctor. Foods that contain iron include: whole grains/cereals, dried fruits, beans, or peas, leafy green vegetables, and organ meats (liver, kidney). What side effects may I notice from receiving this medicine? Side effects that you should report to your doctor or health care professional as soon as possible: -allergic reactions like skin rash, itching or hives, swelling of the face, lips, or tongue -breathing problems -changes in blood pressure -feeling faint or lightheaded, falls -flushing, sweating, or hot feelings Side effects that usually do not require medical attention (report to your doctor or health care professional if they continue or are bothersome): -changes in taste -constipation -dizziness -headache -nausea -pain, redness, or irritation at site where injected -vomiting This list may not describe all possible side effects. Call your doctor for medical advice about side effects. You may report side effects to FDA at 1-800-FDA-1088. Where should I keep my medicine? This drug is given in a hospital or clinic and will not be stored at home. NOTE: This sheet is a summary. It may not cover all possible information. If you have questions about this medicine, talk to your doctor, pharmacist, or health care provider.  2018 Elsevier/Gold Standard (2015-08-15 11:20:47)  

## 2018-03-02 ENCOUNTER — Ambulatory Visit (INDEPENDENT_AMBULATORY_CARE_PROVIDER_SITE_OTHER): Payer: Medicare Other | Admitting: *Deleted

## 2018-03-02 DIAGNOSIS — I495 Sick sinus syndrome: Secondary | ICD-10-CM | POA: Diagnosis not present

## 2018-03-02 DIAGNOSIS — I442 Atrioventricular block, complete: Secondary | ICD-10-CM

## 2018-03-03 NOTE — Progress Notes (Signed)
Remote pacemaker transmission.   

## 2018-03-07 ENCOUNTER — Inpatient Hospital Stay: Payer: Medicare Other

## 2018-03-07 VITALS — BP 129/67 | HR 59 | Temp 98.2°F | Resp 18

## 2018-03-07 DIAGNOSIS — D500 Iron deficiency anemia secondary to blood loss (chronic): Secondary | ICD-10-CM | POA: Diagnosis not present

## 2018-03-07 MED ORDER — SODIUM CHLORIDE 0.9 % IV SOLN
INTRAVENOUS | Status: DC
  Administered 2018-03-07: 15:00:00 via INTRAVENOUS
  Filled 2018-03-07: qty 250

## 2018-03-07 MED ORDER — FERRIC CARBOXYMALTOSE 750 MG/15ML IV SOLN
750.0000 mg | Freq: Once | INTRAVENOUS | Status: AC
  Administered 2018-03-07: 750 mg via INTRAVENOUS
  Filled 2018-03-07: qty 15

## 2018-03-07 NOTE — Patient Instructions (Signed)
Ferric carboxymaltose injection What is this medicine? FERRIC CARBOXYMALTOSE (ferr-ik car-box-ee-mol-toes) is an iron complex. Iron is used to make healthy red blood cells, which carry oxygen and nutrients throughout the body. This medicine is used to treat anemia in people with chronic kidney disease or people who cannot take iron by mouth. This medicine may be used for other purposes; ask your health care provider or pharmacist if you have questions. COMMON BRAND NAME(S): Injectafer What should I tell my health care provider before I take this medicine? They need to know if you have any of these conditions: -anemia not caused by low iron levels -high levels of iron in the blood -liver disease -an unusual or allergic reaction to iron, other medicines, foods, dyes, or preservatives -pregnant or trying to get pregnant -breast-feeding How should I use this medicine? This medicine is for infusion into a vein. It is given by a health care professional in a hospital or clinic setting. Talk to your pediatrician regarding the use of this medicine in children. Special care may be needed. Overdosage: If you think you have taken too much of this medicine contact a poison control center or emergency room at once. NOTE: This medicine is only for you. Do not share this medicine with others. What if I miss a dose? It is important not to miss your dose. Call your doctor or health care professional if you are unable to keep an appointment. What may interact with this medicine? Do not take this medicine with any of the following medications: -deferoxamine -dimercaprol -other iron products This medicine may also interact with the following medications: -chloramphenicol -deferasirox This list may not describe all possible interactions. Give your health care provider a list of all the medicines, herbs, non-prescription drugs, or dietary supplements you use. Also tell them if you smoke, drink alcohol, or use  illegal drugs. Some items may interact with your medicine. What should I watch for while using this medicine? Visit your doctor or health care professional regularly. Tell your doctor if your symptoms do not start to get better or if they get worse. You may need blood work done while you are taking this medicine. You may need to follow a special diet. Talk to your doctor. Foods that contain iron include: whole grains/cereals, dried fruits, beans, or peas, leafy green vegetables, and organ meats (liver, kidney). What side effects may I notice from receiving this medicine? Side effects that you should report to your doctor or health care professional as soon as possible: -allergic reactions like skin rash, itching or hives, swelling of the face, lips, or tongue -breathing problems -changes in blood pressure -feeling faint or lightheaded, falls -flushing, sweating, or hot feelings Side effects that usually do not require medical attention (report to your doctor or health care professional if they continue or are bothersome): -changes in taste -constipation -dizziness -headache -nausea -pain, redness, or irritation at site where injected -vomiting This list may not describe all possible side effects. Call your doctor for medical advice about side effects. You may report side effects to FDA at 1-800-FDA-1088. Where should I keep my medicine? This drug is given in a hospital or clinic and will not be stored at home. NOTE: This sheet is a summary. It may not cover all possible information. If you have questions about this medicine, talk to your doctor, pharmacist, or health care provider.  2018 Elsevier/Gold Standard (2015-08-15 11:20:47)  

## 2018-03-22 LAB — CUP PACEART REMOTE DEVICE CHECK
Battery Impedance: 2007 Ohm
Battery Remaining Longevity: 28 mo
Battery Voltage: 2.75 V
Brady Statistic AP VP Percent: 95 %
Brady Statistic AP VS Percent: 5 %
Brady Statistic AS VP Percent: 0 %
Brady Statistic AS VS Percent: 0 %
Date Time Interrogation Session: 20190807195241
Implantable Lead Implant Date: 20120815
Implantable Lead Implant Date: 20120815
Implantable Lead Location: 753859
Implantable Lead Location: 753860
Implantable Lead Model: 5076
Implantable Lead Model: 5076
Implantable Pulse Generator Implant Date: 20120815
Lead Channel Impedance Value: 466 Ohm
Lead Channel Impedance Value: 487 Ohm
Lead Channel Pacing Threshold Amplitude: 0.875 V
Lead Channel Pacing Threshold Pulse Width: 0.4 ms
Lead Channel Setting Pacing Amplitude: 2.5 V
Lead Channel Setting Pacing Amplitude: 2.5 V
Lead Channel Setting Pacing Pulse Width: 0.4 ms
Lead Channel Setting Sensing Sensitivity: 2 mV

## 2018-04-01 ENCOUNTER — Other Ambulatory Visit: Payer: Self-pay | Admitting: Internal Medicine

## 2018-04-01 DIAGNOSIS — K2270 Barrett's esophagus without dysplasia: Secondary | ICD-10-CM

## 2018-05-27 ENCOUNTER — Ambulatory Visit (INDEPENDENT_AMBULATORY_CARE_PROVIDER_SITE_OTHER): Payer: Medicare Other | Admitting: Internal Medicine

## 2018-05-27 ENCOUNTER — Encounter: Payer: Self-pay | Admitting: Internal Medicine

## 2018-05-27 VITALS — BP 145/64 | HR 60 | Ht 64.0 in | Wt 167.4 lb

## 2018-05-27 DIAGNOSIS — I495 Sick sinus syndrome: Secondary | ICD-10-CM

## 2018-05-27 DIAGNOSIS — I10 Essential (primary) hypertension: Secondary | ICD-10-CM

## 2018-05-27 DIAGNOSIS — I442 Atrioventricular block, complete: Secondary | ICD-10-CM

## 2018-05-27 DIAGNOSIS — I480 Paroxysmal atrial fibrillation: Secondary | ICD-10-CM | POA: Diagnosis not present

## 2018-05-27 LAB — CUP PACEART INCLINIC DEVICE CHECK
Battery Impedance: 2136 Ohm
Battery Remaining Longevity: 25 mo
Battery Voltage: 2.73 V
Brady Statistic AP VP Percent: 96 %
Brady Statistic AP VS Percent: 4 %
Brady Statistic AS VP Percent: 0 %
Brady Statistic AS VS Percent: 0 %
Date Time Interrogation Session: 20191101152500
Implantable Lead Implant Date: 20120815
Implantable Lead Implant Date: 20120815
Implantable Lead Location: 753859
Implantable Lead Location: 753860
Implantable Lead Model: 5076
Implantable Lead Model: 5076
Implantable Pulse Generator Implant Date: 20120815
Lead Channel Impedance Value: 440 Ohm
Lead Channel Impedance Value: 475 Ohm
Lead Channel Pacing Threshold Amplitude: 0.75 V
Lead Channel Pacing Threshold Amplitude: 0.75 V
Lead Channel Pacing Threshold Amplitude: 1 V
Lead Channel Pacing Threshold Pulse Width: 0.4 ms
Lead Channel Pacing Threshold Pulse Width: 0.4 ms
Lead Channel Pacing Threshold Pulse Width: 0.4 ms
Lead Channel Setting Pacing Amplitude: 2.5 V
Lead Channel Setting Pacing Amplitude: 2.5 V
Lead Channel Setting Pacing Pulse Width: 0.4 ms
Lead Channel Setting Sensing Sensitivity: 2 mV

## 2018-05-27 NOTE — Progress Notes (Signed)
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  Since last being seen in our clinic, the patient reports doing reasonably well for his age. His primary concern is with musculoskeletal issues.  He has pain in his L shoulder.  Today, he denies symptoms of palpitations, chest pain, shortness of breath,  lower extremity edema, dizziness, presyncope, or syncope.  The patient is otherwise without complaint today.   Past Medical History:  Diagnosis Date  . Anemia   . Arthritis   . Atrial flutter (HCC)    Amiodarone therapy, coumadin therapy, prior history of cardioversion and overdrive pacing  . Barrett's esophagus   . Benign prostatic hyperplasia   . Cardiomyopathy    Mixed ischemic and nonischemic with previous EF of 35-40%, most recently in June 2009 normalized to 55-60%. Echo 2/11 EF 55-60%  . Carotid artery disease (HCC)    Less than 50% bilateral internal carotid artery stenosis by Doppler 2009  . Carpal tunnel syndrome   . Chronic diastolic (congestive) heart failure ([place])   . Coronary atherosclerosis of native coronary artery    Status post coronary bypass grafting  . 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 Endocarditis   . Essential hypertension, benign   . Ganglion cyst    right wrist  . GERD (gastroesophageal reflux disease)    PMH  . Hiatal hernia   . HOH (hard of hearing)   . Hyperlipidemia   . Hypothyroidism   . Inguinal hernia    Left  . Internal hemorrhoids   . Iron deficiency anemia   . Obstructive sleep apnea   . Osteoporosis   . Paroxysmal atrial fibrillation (HCC)    Amiodarone therapy, status post Cox-Maze procedure but with recurrent atrial fibrillation while on amiodarone August 2012  . Presence of permanent cardiac pacemaker   . Symptomatic bradycardia   . Tubular adenoma of  colon   . 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 Warfarin anticoagulation   . Wears glasses   . Wears partial dentures    Past Surgical History:  Procedure Laterality Date  . AORTIC VALVE REPLACEMENT    . CARPAL TUNNEL RELEASE  2009  . CATARACT EXTRACTION, BILATERAL  07/2011  . COLONOSCOPY W/ BIOPSIES AND POLYPECTOMY    . CORONARY ARTERY BYPASS GRAFT  2008   x2  . HERNIA REPAIR  age 19  . INGUINAL HERNIA REPAIR Left 08/30/2017   Procedure: LEFT INGUINAL HERNIA REPAIR;  Surgeon: Rolm Bookbinder, MD;  Location: [place];  Service: General;  Laterality: Left;  GENERAL ETT AND TAP BLOCK  . INSERTION OF MESH Left 08/30/2017   Procedure: INSERTION OF MESH;  Surgeon: Rolm Bookbinder, MD;  Location: [place];  Service: General;  Laterality: Left;  GENERAL ETT AND TAP BLOCK  . KNEE ARTHROSCOPY    . MASS EXCISION Right 01/26/2018   Procedure: Right wrist dorsal mass excision;  Surgeon: Iran Planas, MD;  Location: [place];  Service: Orthopedics;  Laterality: Right;  60 mins  . PACEMAKER INSERTION  2009   [facility]ially implanted at [facility], extracted by Dr Lovena Le for endocarditis and reimplanted 8/12  . PATELLA FRACTURE SURGERY  age 17   screw placed and removed  . TEE WITHOUT CARDIOVERSION N/A 07/05/2017   Procedure: TRANSESOPHAGEAL ECHOCARDIOGRAM (  TEE);  Surgeon: Sanda Klein, MD;  Location: [place];  Service: Cardiovascular;  Laterality: N/A;  . TONSILLECTOMY  1939  . TOTAL HIP ARTHROPLASTY      ROS- all systems are reviewed and negative except as per HPI above  Current Outpatient Medications  Medication Sig Dispense Refill  . amiodarone (PACERONE) 200 MG tablet Take 0.5 tablets (100 mg total) by mouth daily. 45 tablet 3  . amLODipine (NORVASC) 5 MG tablet Take 5 mg by mouth daily.  3  . benazepril (LOTENSIN) 40 MG tablet Take 1 tablet (40 mg total) by mouth daily. 90 tablet 3  . carvedilol (COREG) 3.125 MG tablet TAKE 1 TABLET (3.125 MG  TOTAL) BY MOUTH 2 (TWO) TIMES DAILY. 180 tablet 3  . docusate sodium (COLACE) 100 MG capsule Take 100 mg by mouth at bedtime.     . furosemide (LASIX) 40 MG tablet TAKE 2 TABLETS BY MOUTH IN THE MORNING AND TAKE 1 TAB EVERY EVEVING,MAY TAKE 1 EXTRA TAB IN AM. (Patient taking differently: TAKE 60MG  BY MOUTH IN THE MORNING AND TAKE 20MG  TAB EVERY EVEVING,MAY TAKE 1 EXTRA TAB IN AM.) 100 tablet 2  . levothyroxine (SYNTHROID, LEVOTHROID) 50 MCG tablet Take 50 mcg by mouth daily before breakfast.    . Multiple Vitamins-Minerals (THEREMS M PO) Take 1 tablet by mouth daily.      . nitroGLYCERIN (NITROSTAT) 0.4 MG SL tablet Place 1 tablet (0.4 mg total) under the tongue every 5 (five) minutes as needed. Up to 3 doses, if no relief after 3rd dose, proceed to the ED for an evaluation. 25 tablet 3  . potassium chloride SA (KLOR-CON M20) 20 MEQ tablet TAKE 20 MEQ BY MOUTH DAILY 90 tablet 3  . pravastatin (PRAVACHOL) 40 MG tablet TAKE 1 TABLET BY MOUTH EVERY EVENING 90 tablet 2  . Probiotic Product (PROBIOTIC DAILY PO) Take 1 tablet by mouth daily.    . psyllium (METAMUCIL) 58.6 % packet Take 1 packet by mouth daily.    Marland Kitchen warfarin (COUMADIN) 2 MG tablet Take 2-3 mg by mouth See admin instructions. Take 3 mg by mouth daily on Monday, Wednesday, Friday. Take 2 mg by mouth daily on all other days.     No current facility-administered medications for this visit.     Physical Exam: Vitals:   05/27/18 1138  BP: (!) 145/64  Pulse: 60  SpO2: 93%  Weight: 167 lb 6.4 oz (75.9 kg)  Height: 5\' 4"  (1.626 m)    GEN- The patient is elderly and frail appearing, alert and oriented x 3 today.   Head- normocephalic, atraumatic Eyes-  Sclera clear, conjunctiva pink Ears- hearing intact Oropharynx- clear Lungs- Clear to ausculation bilaterally, normal work of breathing Chest- pacemaker pocket is well healed Heart- Regular rate and rhythm, no murmurs, rubs or gallops, PMI not laterally displaced GI- soft, NT, ND, +  BS Extremities- no clubbing, cyanosis, or edema  Pacemaker interrogation- reviewed in detail today,  See PACEART report   Assessment and Plan:  1. Symptomatic sinus bradycardia and complete heart block Normal pacemaker function See Pace Art report No changes today  2. afib Well controlled with amiodarone 100mg  daily PCP to follow LFTs, TFTs afib burden is 0 %   3. Valvular heart disease On coumadin  4. HTN Stable No change required today  Carelink Return in a year  Thompson Grayer MD, [facility] 05/27/2018 12:12 PM

## 2018-05-27 NOTE — Patient Instructions (Signed)
Medication Instructions:  Continue all current medications.  Labwork: none  Testing/Procedures: none  Follow-Up: 1 year   Any Other Special Instructions Will Be Listed Below (If Applicable). Remote monitoring is used to monitor your Pacemaker of ICD from home. This monitoring reduces the number of office visits required to check your device to one time per year. It allows [place] to keep an eye on the functioning of your device to ensure it is working properly. You are scheduled for a device check from home on 06/01/18. You may send your transmission at any time that day. If you have a wireless device, the transmission will be sent automatically. After your physician reviews your transmission, you will receive a postcard with your next transmission date.  If you need a refill on your cardiac medications before your next appointment, please call your pharmacy.

## 2018-06-01 ENCOUNTER — Ambulatory Visit (INDEPENDENT_AMBULATORY_CARE_PROVIDER_SITE_OTHER): Payer: Medicare Other | Admitting: *Deleted

## 2018-06-01 DIAGNOSIS — I442 Atrioventricular block, complete: Secondary | ICD-10-CM | POA: Diagnosis not present

## 2018-06-01 DIAGNOSIS — I495 Sick sinus syndrome: Secondary | ICD-10-CM

## 2018-06-01 NOTE — Progress Notes (Signed)
Remote pacemaker transmission.   

## 2018-06-05 ENCOUNTER — Encounter: Payer: Self-pay | Admitting: Cardiology

## 2018-06-21 ENCOUNTER — Inpatient Hospital Stay: Payer: Medicare Other | Attending: Hematology | Admitting: Hematology

## 2018-06-21 ENCOUNTER — Telehealth: Payer: Self-pay | Admitting: Hematology

## 2018-06-21 ENCOUNTER — Inpatient Hospital Stay: Payer: Medicare Other

## 2018-06-21 VITALS — BP 117/68 | HR 60 | Temp 98.0°F | Resp 18 | Ht 64.0 in | Wt 164.0 lb

## 2018-06-21 DIAGNOSIS — Z8249 Family history of ischemic heart disease and other diseases of the circulatory system: Secondary | ICD-10-CM | POA: Insufficient documentation

## 2018-06-21 DIAGNOSIS — Z800 Family history of malignant neoplasm of digestive organs: Secondary | ICD-10-CM | POA: Diagnosis not present

## 2018-06-21 DIAGNOSIS — I480 Paroxysmal atrial fibrillation: Secondary | ICD-10-CM | POA: Insufficient documentation

## 2018-06-21 DIAGNOSIS — Z952 Presence of prosthetic heart valve: Secondary | ICD-10-CM | POA: Diagnosis not present

## 2018-06-21 DIAGNOSIS — D500 Iron deficiency anemia secondary to blood loss (chronic): Secondary | ICD-10-CM

## 2018-06-21 DIAGNOSIS — Z79899 Other long term (current) drug therapy: Secondary | ICD-10-CM | POA: Diagnosis not present

## 2018-06-21 DIAGNOSIS — Z7901 Long term (current) use of anticoagulants: Secondary | ICD-10-CM | POA: Insufficient documentation

## 2018-06-21 DIAGNOSIS — Z87891 Personal history of nicotine dependence: Secondary | ICD-10-CM | POA: Diagnosis not present

## 2018-06-21 DIAGNOSIS — Z950 Presence of cardiac pacemaker: Secondary | ICD-10-CM | POA: Diagnosis not present

## 2018-06-21 DIAGNOSIS — K5900 Constipation, unspecified: Secondary | ICD-10-CM | POA: Diagnosis not present

## 2018-06-21 DIAGNOSIS — R5383 Other fatigue: Secondary | ICD-10-CM | POA: Diagnosis not present

## 2018-06-21 DIAGNOSIS — I110 Hypertensive heart disease with heart failure: Secondary | ICD-10-CM | POA: Diagnosis not present

## 2018-06-21 DIAGNOSIS — Z951 Presence of aortocoronary bypass graft: Secondary | ICD-10-CM | POA: Diagnosis not present

## 2018-06-21 DIAGNOSIS — E039 Hypothyroidism, unspecified: Secondary | ICD-10-CM | POA: Diagnosis not present

## 2018-06-21 LAB — CBC WITH DIFFERENTIAL/PLATELET
Abs Immature Granulocytes: 0.02 10*3/uL (ref 0.00–0.07)
Basophils Absolute: 0.1 10*3/uL (ref 0.0–0.1)
Basophils Relative: 1 %
Eosinophils Absolute: 0.2 10*3/uL (ref 0.0–0.5)
Eosinophils Relative: 3 %
HCT: 35.6 % — ABNORMAL LOW (ref 39.0–52.0)
Hemoglobin: 10.9 g/dL — ABNORMAL LOW (ref 13.0–17.0)
Immature Granulocytes: 0 %
Lymphocytes Relative: 12 %
Lymphs Abs: 0.7 10*3/uL (ref 0.7–4.0)
MCH: 28.4 pg (ref 26.0–34.0)
MCHC: 30.6 g/dL (ref 30.0–36.0)
MCV: 92.7 fL (ref 80.0–100.0)
Monocytes Absolute: 0.5 10*3/uL (ref 0.1–1.0)
Monocytes Relative: 9 %
Neutro Abs: 4.5 10*3/uL (ref 1.7–7.7)
Neutrophils Relative %: 75 %
Platelets: 123 10*3/uL — ABNORMAL LOW (ref 150–400)
RBC: 3.84 MIL/uL — ABNORMAL LOW (ref 4.22–5.81)
RDW: 17.1 % — ABNORMAL HIGH (ref 11.5–15.5)
WBC: 6 10*3/uL (ref 4.0–10.5)
nRBC: 0 % (ref 0.0–0.2)

## 2018-06-21 LAB — FERRITIN: Ferritin: 166 ng/mL (ref 24–336)

## 2018-06-21 LAB — RETICULOCYTES
Immature Retic Fract: 23 % — ABNORMAL HIGH (ref 2.3–15.9)
RBC.: 3.84 MIL/uL — ABNORMAL LOW (ref 4.22–5.81)
Retic Count, Absolute: 81.4 10*3/uL (ref 19.0–186.0)
Retic Ct Pct: 2.1 % (ref 0.4–3.1)

## 2018-06-21 LAB — IRON AND TIBC
Iron: 49 ug/dL (ref 42–163)
Saturation Ratios: 16 % — ABNORMAL LOW (ref 20–55)
TIBC: 303 ug/dL (ref 202–409)
UIBC: 254 ug/dL (ref 117–376)

## 2018-06-21 NOTE — Telephone Encounter (Signed)
Gave pt avs and calendar  °

## 2018-06-21 NOTE — Progress Notes (Signed)
[facility]    HEMATOLOGY/ONCOLOGY CLINIC NOTE  Date of Service: 06/21/18   Patient Care Team: Eber Hong, MD as PCP - General (Internal Medicine) Satira Sark, MD as PCP - Cardiology (Cardiology) Thompson Grayer, MD as PCP - Electrophysiology (Cardiology) Zenovia Jarred MD (Gastroenterology)  CHIEF COMPLAINTS F/u for iron deficiency anemia  HISTORY OF PRESENTING ILLNESS:   [name] is a wonderful 84 y.o. male who has been referred to [place] by Dr .Eber Hong, MD  for evaluation and management of Iron deficiency Anemia  He has a history of multiple medical comorbidities including coronary artery disease, chronic systolic CHF, atrial fibrillation on Coumadin, peripheral vascular disease, permanent pacemaker placement, adenomatous colonic polyps and long segment Barrett's esophagus without significant dysplasia on chronic PPI therapy.  Patient has been noting increasing fatigue from earlier this year and was noted to have anemia with hemoglobin of 7.8 with an MCV of 69 in January 2017. Ferritin level was noted to be 19 with an iron saturation of 3%. Fecal occult blood testing in March was noted to be positive for occult GI bleeding.  Patient received IV Feraheme in March and July 2017 for iron replacement. He had an upper endoscopy and colonoscopy which were performed on 11/12/2015. EGD revealed 4 cm of Barrett's esophagus which was biopsied and found to be Barrett's without dysplasia. There was a 3 cm hiatal hernia. Normal stomach and normal duodenum. Duodenal biopsies were negative for celiac. Colonoscopy performed on the same day revealed 2 cecal polyps. 3 ascending colon polyps one of which was 14 mm and removed with hot snare. It was prophylactically endoclipped. The remainder of the examination was unremarkable. Internal hemorrhoids were seen. These polyps were found to be adenomas without high-grade dysplasia. Has not had a capsule endoscopy.  Patient's hemoglobin levels have been  somewhat better in the mid to high 9 range but with persistent iron deficiency and lack of normalization of his anemia.  He has been referred to [place] for further management of his iron deficiency anemia. He continues to be on Coumadin for his atrial fibrillation. He has been on oral iron preparation which is causing some constipation and darker stools. Reports no overt GI bleeding. His last INR was 2.3 yesterday as per his report. He monitors this with his primary care physician. Notes persistent fatigue which is likely multifactorial.  Is on levothyroxine replacement for hypothyroidism. Takes a daily multivitamin.  Patient reports that he notes his weight closely to avoid fluid overload from CHF and notes no weight loss.  No other evidence of overt bleeding. No hematuria no gum bleeds no epistaxis. Notes easy skin bruising. Not using any over-the-counter NSAIDs.   INTERVAL HISTORY   [name] Is here today for management and evaluation for Iron deficiency anemia. The patient's last visit with [place] was on 02/21/18. He is accompanied today by his wife. The pt reports that he is doing well overall.   The pt reports that he has not developed any new concerns in the interim. He tolerated his last Iron infusion without concern.   The pt denies noticing any blood in the stools or black stools. He continues on Coumadin.   Lab results today (06/21/18) of CBC w/diff and Reticulocytes is as follows: all values are WNL except for RBC at 3.84, Hgb at 10.9, HCT at 35.6, RDW at 17.1, PLT at 123k, Immature retic fract at 23.0%. 06/21/18 Ferritin is at 166 06/21/18 Iron and TIBC revealed all values WNL except for 16% Saturation  ratio  On review of systems, pt reports stable energy levels, and denies blood in the stools, black stools, abdominal pains, leg swelling, and any other symptoms.    MEDICAL HISTORY:  Past Medical History:  Diagnosis Date  . Anemia   . Arthritis   . Atrial flutter (HCC)     Amiodarone therapy, coumadin therapy, prior history of cardioversion and overdrive pacing  . Barrett's esophagus   . Benign prostatic hyperplasia   . Cardiomyopathy    Mixed ischemic and nonischemic with previous EF of 35-40%, most recently in June 2009 normalized to 55-60%. Echo 2/11 EF 55-60%  . Carotid artery disease (HCC)    Less than 50% bilateral internal carotid artery stenosis by Doppler 2009  . Carpal tunnel syndrome   . Chronic diastolic (congestive) heart failure ([place])   . Coronary atherosclerosis of native coronary artery    Status post coronary bypass grafting  . 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 Endocarditis   . Essential hypertension, benign   . Ganglion cyst    right wrist  . GERD (gastroesophageal reflux disease)    PMH  . Hiatal hernia   . HOH (hard of hearing)   . Hyperlipidemia   . Hypothyroidism   . Inguinal hernia    Left  . Internal hemorrhoids   . Iron deficiency anemia   . Obstructive sleep apnea   . Osteoporosis   . Paroxysmal atrial fibrillation (HCC)    Amiodarone therapy, status post Cox-Maze procedure but with recurrent atrial fibrillation while on amiodarone August 2012  . Presence of permanent cardiac pacemaker   . Symptomatic bradycardia   . Tubular adenoma of colon   . 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 Warfarin anticoagulation   . Wears glasses   . Wears partial dentures     SURGICAL HISTORY: Past Surgical History:  Procedure Laterality Date  . AORTIC VALVE REPLACEMENT    . CARPAL TUNNEL RELEASE  2009  . CATARACT EXTRACTION, BILATERAL  07/2011  . COLONOSCOPY W/ BIOPSIES AND POLYPECTOMY    . CORONARY ARTERY BYPASS GRAFT  2008   x2  . HERNIA REPAIR  age 66  . INGUINAL HERNIA REPAIR Left 08/30/2017   Procedure: LEFT INGUINAL HERNIA REPAIR;  Surgeon: Rolm Bookbinder, MD;  Location: [place];  Service: General;   Laterality: Left;  GENERAL ETT AND TAP BLOCK  . INSERTION OF MESH Left 08/30/2017   Procedure: INSERTION OF MESH;  Surgeon: Rolm Bookbinder, MD;  Location: [place];  Service: General;  Laterality: Left;  GENERAL ETT AND TAP BLOCK  . KNEE ARTHROSCOPY    . MASS EXCISION Right 01/26/2018   Procedure: Right wrist dorsal mass excision;  Surgeon: Iran Planas, MD;  Location: [place];  Service: Orthopedics;  Laterality: Right;  60 mins  . PACEMAKER INSERTION  2009   [facility]ially implanted [facility], extracted by Dr Lovena Le for endocarditis and reimplanted 8/12  . PATELLA FRACTURE SURGERY  age 44   screw placed and removed  . TEE WITHOUT CARDIOVERSION N/A 07/05/2017   Procedure: TRANSESOPHAGEAL ECHOCARDIOGRAM (TEE);  Surgeon: Sanda Klein, MD;  Location: [place];  Service: Cardiovascular;  Laterality: N/A;  . TONSILLECTOMY  1939  . TOTAL HIP ARTHROPLASTY      SOCIAL HISTORY: Social History   Socioeconomic History  . Marital status: Married    Spouse name: Not on file  . Number of  children: 3  . Years of education: Not on file  . Highest education level: Not on file  Occupational History  . Occupation: Retired  Scientific laboratory technician  . Financial resource strain: Not on file  . Food insecurity:    Worry: Not on file    Inability: Not on file  . Transportation needs:    Medical: Not on file    Non-medical: Not on file  Tobacco Use  . Smoking status: Former Smoker    Packs/day: 0.30    Years: 8.00    Pack years: 2.40    Types: Cigarettes    Start date: 03/25/1944    Last attempt to quit: 07/27/1954    Years since quitting: 63.9  . Smokeless tobacco: Never Used  Substance and Sexual Activity  . Alcohol use: Yes    Alcohol/week: 0.0 standard drinks    Comment: glass of wine occ  . Drug use: No  . Sexual activity: Not on file  Lifestyle  . Physical activity:    Days per week: Not on file    Minutes per session: Not on file  . Stress: Not on file  Relationships  . Social connections:     Talks on phone: Not on file    Gets together: Not on file    Attends religious service: Not on file    Active member of club or organization: Not on file    Attends meetings of clubs or organizations: Not on file    Relationship status: Not on file  . Intimate partner violence:    Fear of current or ex partner: Not on file    Emotionally abused: Not on file    Physically abused: Not on file    Forced sexual activity: Not on file  Other Topics Concern  . Not on file  Social History Narrative  . Not on file    FAMILY HISTORY: Family History  Problem Relation Age of Onset  . Coronary artery disease Father   . Other Father        Heart attack or stroke  . Cancer Father   . Colon cancer Mother   . Diabetes Neg Hx   . Hypertension Neg Hx   . Esophageal cancer Neg Hx   . Rectal cancer Neg Hx   . Stomach cancer Neg Hx     ALLERGIES:  has No Known Allergies.  MEDICATIONS:  Current Outpatient Medications  Medication Sig Dispense Refill  . amiodarone (PACERONE) 200 MG tablet Take 0.5 tablets (100 mg total) by mouth daily. 45 tablet 3  . amLODipine (NORVASC) 5 MG tablet Take 5 mg by mouth daily.  3  . benazepril (LOTENSIN) 40 MG tablet Take 1 tablet (40 mg total) by mouth daily. 90 tablet 3  . carvedilol (COREG) 3.125 MG tablet TAKE 1 TABLET (3.125 MG TOTAL) BY MOUTH 2 (TWO) TIMES DAILY. 180 tablet 3  . docusate sodium (COLACE) 100 MG capsule Take 100 mg by mouth at bedtime.     . furosemide (LASIX) 40 MG tablet TAKE 2 TABLETS BY MOUTH IN THE MORNING AND TAKE 1 TAB EVERY EVEVING,MAY TAKE 1 EXTRA TAB IN AM. (Patient taking differently: TAKE 60MG  BY MOUTH IN THE MORNING AND TAKE 20MG  TAB EVERY EVEVING,MAY TAKE 1 EXTRA TAB IN AM.) 100 tablet 2  . levothyroxine (SYNTHROID, LEVOTHROID) 50 MCG tablet Take 50 mcg by mouth daily before breakfast.    . Multiple Vitamins-Minerals (THEREMS M PO) Take 1 tablet by mouth daily.      [facility] Kitchen  nitroGLYCERIN (NITROSTAT) 0.4 MG SL tablet Place 1 tablet (0.4  mg total) under the tongue every 5 (five) minutes as needed. Up to 3 doses, if no relief after 3rd dose, proceed to the ED for an evaluation. 25 tablet 3  . potassium chloride SA (KLOR-CON M20) 20 MEQ tablet TAKE 20 MEQ BY MOUTH DAILY 90 tablet 3  . pravastatin (PRAVACHOL) 40 MG tablet TAKE 1 TABLET BY MOUTH EVERY EVENING 90 tablet 2  . Probiotic Product (PROBIOTIC DAILY PO) Take 1 tablet by mouth daily.    . psyllium (METAMUCIL) 58.6 % packet Take 1 packet by mouth daily.    Marland Kitchen warfarin (COUMADIN) 2 MG tablet Take 2-3 mg by mouth See admin instructions. Take 3 mg by mouth daily on Monday, Wednesday, Friday. Take 2 mg by mouth daily on all other days.     No current facility-administered medications for this visit.     REVIEW OF SYSTEMS:    A 10+ POINT REVIEW OF SYSTEMS WAS OBTAINED including neurology, dermatology, psychiatry, cardiac, respiratory, lymph, extremities, GI, GU, Musculoskeletal, constitutional, breasts, reproductive, HEENT.  All pertinent positives are noted in the HPI.  All others are negative.   PHYSICAL EXAMINATION:  ECOG PERFORMANCE STATUS: 3 - Symptomatic, >50% confined to bed  . Vitals:   02/21/18 1449  BP: 135/76  Pulse: 60  Resp: 18  Temp: 97.9 F (36.6 C)  SpO2: (!) 90%   Filed Weights   06/21/18 1412  Weight: 164 lb (74.4 kg)   .Body mass index is 28.15 kg/m.  GENERAL:alert, in no acute distress and comfortable SKIN: no acute rashes, no significant lesions EYES: conjunctiva are pink and non-injected, sclera anicteric OROPHARYNX: MMM, no exudates, no oropharyngeal erythema or ulceration NECK: supple, no JVD LYMPH:  no palpable lymphadenopathy in the cervical, axillary or inguinal regions LUNGS: clear to auscultation b/l with normal respiratory effort HEART: regular rate & rhythm ABDOMEN:  normoactive bowel sounds , non tender, not distended. No palpable hepatosplenomegaly.  Extremity: no pedal edema PSYCH: alert & oriented x 3 with fluent  speech NEURO: no focal motor/sensory deficits   LABORATORY DATA:  I have reviewed the data as listed . CBC Latest Ref Rng & Units 06/21/2018 02/21/2018 01/26/2018  WBC 4.0 - 10.5 K/uL 6.0 4.8 4.8  Hemoglobin 13.0 - 17.0 g/dL 10.9(L) 10.5(L) 11.4(L)  Hematocrit 39.0 - 52.0 % 35.6(L) 33.4(L) 37.9(L)  Platelets 150 - 400 K/uL 123(L) 108(L) 162    . CMP Latest Ref Rng & Units 02/21/2018 01/26/2018 11/02/2017  Glucose 70 - 99 mg/dL 109(H) 82 113  BUN 8 - 23 mg/dL 15 14 16   Creatinine 0.61 - 1.24 mg/dL 0.94 0.92 1.10  Sodium 135 - 145 mmol/L 139 141 142  Potassium 3.5 - 5.1 mmol/L 3.7 4.3 3.8  Chloride 98 - 111 mmol/L 104 109 108  CO2 22 - 32 mmol/L 29 22 27   Calcium 8.9 - 10.3 mg/dL 9.3 9.3 9.3  Total Protein 6.4 - 8.3 g/dL - - 6.6  Total Bilirubin 0.2 - 1.2 mg/dL - - 0.5  Alkaline Phos 40 - 150 U/L - - 107  AST 5 - 34 U/L - - 18  ALT 0 - 55 U/L - - 16    . Lab Results  Component Value Date   IRON 49 06/21/2018   TIBC 303 06/21/2018   IRONPCTSAT 16 (L) 06/21/2018   (Iron and TIBC)  Lab Results  Component Value Date   FERRITIN 166 06/21/2018    EGD 11/12/2015 - Esophageal  mucosal changes suggestive of long-segment Barrett's esophagus. Biopsied. - 3 cm hiatal hernia. - Normal stomach. - Normal examined duodenum. Biopsied.  Colonoscopy 11/12/2015 - Two 4 to 5 mm polyps in the cecum, removed with a cold snare. Resected and retrieved. - Two 4 to 5 mm polyps in the ascending colon, removed with a cold snare. Resected and Retrieved - One 14 mm polyp in the ascending colon, removed with a hot snare. Resected and retrieved. Clip was placed. - Internal hemorrhoids.   RADIOGRAPHIC STUDIES: I have personally reviewed the radiological images as listed and agreed with the findings in the report. No results found.  ASSESSMENT & PLAN:   84 y.o.Caucasian male with multiple medical comorbidities with  1) Microcytic Anemia -predominantly related to iron deficiency from chronic GI  losses.  Patient has been on chronic anticoagulation for his atrial fibrillation and this will continue to be a risk factor from going GI losses. He has received IV Feraheme 2 in March and July 2017 without complete correction of his iron deficiency. This is suggestive of ongoing GI losses. Initial workup as noted below showed no monoclonal protein. No evidence of hemolysis with normal LDH and haptoglobin. Patient had IV Injectafer 750mg  qweekly x2 doses on 05/22/2016 and 05/29/2016. He has multiple potential sources of GI bleeding especially when on Coumadin.  PLAN:  -Discussed pt labwork today, 06/21/18; Ferritin improved to 166, HGB slightly improved to 10.9 -Goal for ferritin>100 -No current indication for iron infusion at this time -Suspect ongoing GI blood loss as pt's ferritin has decreased  -Will see the pt back in 4 months, sooner if any new concerns of light headedness, fatigue, or dizziness    2) Long segment Barrett's esophagus without dysplasia.  3) Hiatal hernia  4) multiple adenomatous colonic polyps.  PLAN:  -Patient was counseled to have a discussion with his gastroenterologist, primary care physician and cardiologist regarding the risk versus benefit of ongoing anticoagulation in the setting of ongoing GI bleeding.  5). Patient Active Problem List   Diagnosis Date Noted  . Inguinal hernia 08/30/2017  . Bacteremia   . Hypoxia 07/03/2017  . Iron deficiency anemia due to chronic blood loss 05/14/2016  . Barrett's esophagus without dysplasia 01/22/2016  . Anemia 08/01/2015  . Symptomatic anemia 08/01/2015  . Chronic diastolic CHF (congestive heart failure) ([place]) 08/01/2015  . Sick sinus syndrome ([place]) 03/20/2013  . Pacemaker 11/24/2011  . Warfarin anticoagulation   . Paroxysmal atrial fibrillation (HCC)   . Valvular heart disease   . Atrial flutter ([place])   . Carotid artery disease ([place])   . History of endocarditis   . HYPERLIPIDEMIA 10/10/2010  . Obstructive  sleep apnea 11/01/2009  . Hypertensive cardiovascular disease 04/10/2009  . Coronary atherosclerosis of native coronary artery 04/10/2009  . Secondary cardiomyopathy ([place]) 04/10/2009   -continue f/u with cardiology and PCP as per their recommendations.   RTC with Dr Irene Limbo with labs in 4 months   All of the patients questions were answered with apparent satisfaction. The patient knows to call the clinic with any problems, questions or concerns.  The total time spent in the appt was 20 minutes and more than 50% was on counseling and direct patient cares.    Sullivan Lone MD [place] AAHIVMS [facility] Hematology/Oncology Physician [facility]  (Office):       [phone] (Work cell):  [phone] (Fax):           [phone]  I, Baldwin [place], am acting as a scribe  for Dr. Sullivan Lone.   .I have reviewed the above documentation for accuracy and completeness, and I agree with the above. Brunetta Genera MD

## 2018-06-22 ENCOUNTER — Other Ambulatory Visit: Payer: Self-pay | Admitting: Cardiology

## 2018-07-01 ENCOUNTER — Other Ambulatory Visit: Payer: Self-pay | Admitting: Cardiology

## 2018-07-14 ENCOUNTER — Encounter: Payer: Self-pay | Admitting: Cardiology

## 2018-07-14 ENCOUNTER — Ambulatory Visit (INDEPENDENT_AMBULATORY_CARE_PROVIDER_SITE_OTHER): Payer: Medicare Other | Admitting: Cardiology

## 2018-07-14 VITALS — BP 144/70 | HR 66 | Ht 66.0 in | Wt 162.0 lb

## 2018-07-14 DIAGNOSIS — I38 Endocarditis, valve unspecified: Secondary | ICD-10-CM

## 2018-07-14 DIAGNOSIS — I495 Sick sinus syndrome: Secondary | ICD-10-CM | POA: Diagnosis not present

## 2018-07-14 DIAGNOSIS — I5032 Chronic diastolic (congestive) heart failure: Secondary | ICD-10-CM | POA: Diagnosis not present

## 2018-07-14 DIAGNOSIS — I480 Paroxysmal atrial fibrillation: Secondary | ICD-10-CM

## 2018-07-14 NOTE — Patient Instructions (Addendum)

## 2018-07-14 NOTE — Progress Notes (Signed)
Cardiology Office Note  Date: 07/14/2018   ID: Lexander, Tremblay Jun 11, 1929, MRN [id number]  PCP: Eber Hong, MD  Primary Cardiologist: Rozann Lesches, MD   Chief Complaint  Patient presents with  . Cardiac follow-up    History of Present Illness: KALEEL SCHMIEDER is an 84 y.o. male last seen in June.  He is here with his wife for a follow-up visit. He tells me that he has been doing about the same, no change in stamina with limited activities, no chest pain or progressive leg swelling.  Weights have been stable at home and he has not had to use any additional Lasix.  He sees Dr. Rayann Heman in the device clinic, Medtronic pacemaker in place.  Very low atrial fibrillation burden by last device interrogation.  He is on Coumadin with follow-up per Dr. Brynda Greathouse.  He reports no spontaneous bleeding episodes.  I reviewed his recent lab work from November as outlined below.  Past Medical History:  Diagnosis Date  . Anemia   . Arthritis   . Atrial flutter (HCC)    Amiodarone therapy, coumadin therapy, prior history of cardioversion and overdrive pacing  . Barrett's esophagus   . Benign prostatic hyperplasia   . Cardiomyopathy    Mixed ischemic and nonischemic with previous EF of 35-40%, most recently in June 2009 normalized to 55-60%. Echo 2/11 EF 55-60%  . Carotid artery disease (HCC)    Less than 50% bilateral internal carotid artery stenosis by Doppler 2009  . Carpal tunnel syndrome   . Chronic diastolic (congestive) heart failure ([place])   . Coronary atherosclerosis of native coronary artery    Status post coronary bypass grafting  . 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 Endocarditis   . Essential hypertension, benign   . Ganglion cyst    right wrist  . GERD (gastroesophageal reflux disease)    PMH  . Hiatal hernia   . HOH (hard of hearing)   . Hyperlipidemia   . Hypothyroidism   . Inguinal hernia    Left  . Internal  hemorrhoids   . Iron deficiency anemia   . Obstructive sleep apnea   . Osteoporosis   . Paroxysmal atrial fibrillation (HCC)    Amiodarone therapy, status post Cox-Maze procedure but with recurrent atrial fibrillation while on amiodarone August 2012  . Presence of permanent cardiac pacemaker   . Symptomatic bradycardia   . Tubular adenoma of colon   . 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 Warfarin anticoagulation   . Wears glasses   . Wears partial dentures     Past Surgical History:  Procedure Laterality Date  . AORTIC VALVE REPLACEMENT    . CARPAL TUNNEL RELEASE  2009  . CATARACT EXTRACTION, BILATERAL  07/2011  . COLONOSCOPY W/ BIOPSIES AND POLYPECTOMY    . CORONARY ARTERY BYPASS GRAFT  2008   x2  . HERNIA REPAIR  age 36  . INGUINAL HERNIA REPAIR Left 08/30/2017   Procedure: LEFT INGUINAL HERNIA REPAIR;  Surgeon: Rolm Bookbinder, MD;  Location: [facility];  Service: General;  Laterality: Left;  GENERAL ETT AND TAP BLOCK  . INSERTION OF MESH Left 08/30/2017   Procedure: INSERTION OF MESH;  Surgeon: Rolm Bookbinder, MD;  Location: [place];  Service: General;  Laterality: Left;  GENERAL ETT AND TAP BLOCK  . KNEE ARTHROSCOPY    . MASS EXCISION Right  01/26/2018   Procedure: Right wrist dorsal mass excision;  Surgeon: Iran Planas, MD;  Location: [place];  Service: Orthopedics;  Laterality: Right;  60 mins  . PACEMAKER INSERTION  2009   Initially implanted at [facility], extracted by Dr Lovena Le for endocarditis and reimplanted 8/12  . PATELLA FRACTURE SURGERY  age 43   screw placed and removed  . TEE WITHOUT CARDIOVERSION N/A 07/05/2017   Procedure: TRANSESOPHAGEAL ECHOCARDIOGRAM (TEE);  Surgeon: Sanda Klein, MD;  Location: [place];  Service: Cardiovascular;  Laterality: N/A;  . TONSILLECTOMY  1939  . TOTAL HIP ARTHROPLASTY      Current Outpatient Medications  Medication Sig Dispense Refill  . amiodarone (PACERONE) 200 MG tablet  Take 0.5 tablets (100 mg total) by mouth daily. 45 tablet 3  . amLODipine (NORVASC) 5 MG tablet Take 5 mg by mouth daily.  3  . benazepril (LOTENSIN) 40 MG tablet TAKE 1 TABLET BY MOUTH EVERY DAY 90 tablet 3  . carvedilol (COREG) 3.125 MG tablet TAKE 1 TABLET (3.125 MG TOTAL) BY MOUTH 2 (TWO) TIMES DAILY. 180 tablet 3  . docusate sodium (COLACE) 100 MG capsule Take 100 mg by mouth at bedtime.     . furosemide (LASIX) 40 MG tablet TAKE 2 TABLETS BY MOUTH IN THE MORNING AND TAKE 1 TAB EVERY EVEVING,MAY TAKE 1 EXTRA TAB IN AM. (Patient taking differently: TAKE 60MG  BY MOUTH IN THE MORNING AND TAKE 20MG  TAB EVERY EVEVING,MAY TAKE 1 EXTRA TAB IN AM.) 100 tablet 2  . levothyroxine (SYNTHROID, LEVOTHROID) 50 MCG tablet Take 50 mcg by mouth daily before breakfast.    . Multiple Vitamins-Minerals (THEREMS M PO) Take 1 tablet by mouth daily.      . nitroGLYCERIN (NITROSTAT) 0.4 MG SL tablet Place 1 tablet (0.4 mg total) under the tongue every 5 (five) minutes as needed. Up to 3 doses, if no relief after 3rd dose, proceed to the ED for an evaluation. 25 tablet 3  . potassium chloride SA (KLOR-CON M20) 20 MEQ tablet TAKE 20 MEQ BY MOUTH DAILY 90 tablet 3  . pravastatin (PRAVACHOL) 40 MG tablet TAKE 1 TABLET BY MOUTH EVERY EVENING 90 tablet 3  . Probiotic Product (PROBIOTIC DAILY PO) Take 1 tablet by mouth daily.    . psyllium (METAMUCIL) 58.6 % packet Take 1 packet by mouth daily.    Marland Kitchen warfarin (COUMADIN) 2 MG tablet Take 2-3 mg by mouth See admin instructions. Take 3 mg by mouth daily on Monday, Wednesday, Friday. Take 2 mg by mouth daily on all other days.     No current facility-administered medications for this visit.    Allergies:  Patient has no known allergies.   Social History: The patient  reports that he quit smoking about 64 years ago. His smoking use included cigarettes. He started smoking about 74 years ago. He has a 2.40 pack-year smoking history. He has never used smokeless tobacco. He reports  current alcohol use. He reports that he does not use drugs.   ROS:  Please see the history of present illness. Otherwise, complete review of systems is positive for hearing loss.  All other systems are reviewed and negative.   Physical Exam: VS:  BP (!) 144/70   Pulse 66   Ht 5\' 6"  (1.676 m)   Wt 162 lb (73.5 kg)   SpO2 95%   BMI 26.15 kg/m , BMI Body mass index is 26.15 kg/m.  Wt Readings from Last 3 Encounters:  07/14/18 162 lb (73.5 kg)  06/21/18  164 lb (74.4 kg)  05/27/18 167 lb 6.4 oz (75.9 kg)    General: Elderly male, using a walker. HEENT: Conjunctiva and lids normal, oropharynx clear. Neck: Supple, no elevated JVP or carotid bruits, no thyromegaly. Lungs: Clear to auscultation, nonlabored breathing at rest. Cardiac: Regular rate and rhythm, no S3, 2/6 systolic murmur. Abdomen: Soft, nontender, bowel sounds present. Extremities: Mild ankle edema, compression stockings in place, distal pulses 1-+. Skin: Warm and dry. Musculoskeletal: No kyphosis. Neuropsychiatric: Alert and oriented x3, affect grossly appropriate.  ECG: I personally reviewed the tracing from 07/03/2017 which showed a dual-chamber paced rhythm.  Recent Labwork: 11/02/2017: ALT 16; AST 18 02/21/2018: BUN 15; Creatinine, Ser 0.94; Potassium 3.7; Sodium 139 06/21/2018: Hemoglobin 10.9; Platelets 123  November 2019: Hemoglobin 10.9, platelets 123, potassium 3.8, BUN 17, creatinine 0.91, AST 24, ALT 18, cholesterol 106, triglycerides 42, HDL 45, LDL 53, TSH 5.99  Other Studies Reviewed Today:  TEE 07/05/2017: Study Conclusions  - Left ventricle: There was mild concentric hypertrophy. Systolic   function was normal. The estimated ejection fraction was in the   range of 55% to 60%. Wall motion was normal; there were no   regional wall motion abnormalities. - Aortic valve: A 21.0 cmCarpentier-Edwards pericardial   bioprosthesis was present. The prosthesis had a normal range of   motion. The sewing ring  appeared normal. No evidence of   vegetation. Valve area (VTI): 0.77 cm^2. Valve area (Vmax): 0.77   cm^2. Valve area (Vmean): 0.76 cm^2. - Mitral valve: Prior procedures included surgical repair. An   annular ring prosthesis was present and functioning normally. No   evidence of vegetation. There was mild to moderate regurgitation   directed eccentrically and toward the septum. Mean gradient (D):   4 mm Hg at a heart rate of 70 bpm. Valve area by pressure   half-time: 2 cm^2. Valve area by continuity equation (using LVOT   flow): 1.29 cm^2. - Left atrium: The atrium was severely dilated. No evidence of   thrombus in the atrial cavity or appendage. There was   mildcontinuous spontaneous echo contrast (&quot;smoke&quot;) in the cavity   and the appendage. There is only a remnant of the appendage,   which appears to be surgically resected or clipped. - Right ventricle: The cavity size was mildly dilated. - Right atrium: The atrium was severely dilated. - Tricuspid valve: No evidence of vegetation. There was moderate   regurgitation. - Pulmonic valve: No evidence of vegetation.  Impressions:  - The very high transvalvular gradients through the aortic valve   prosthesis, reported on the transthoracic study, are not   confirmed on the current study, but the gradients are   nevertheless abnormally high.  Assessment and Plan:  1.  Chronic diastolic heart failure, clinically stable.  His weight is down a few pounds and he is tolerating diuretic regimen without adjustment over the last 6 months.  No changes were made today.  2.  Paroxysmal atrial fibrillation.  He is symptomatically stable on low-dose amiodarone and continues on Coumadin.  Recent lab work from November reviewed.  3.  Sick sinus syndrome with Medtronic pacemaker in place.  Keep follow-up with Dr. Rayann Heman.  4.  CAD status post CABG.  He is not on aspirin with concurrent use of Coumadin.  Continue statin therapy.  5.   Bioprosthetic AVR and mitral valve repair, clinically stable.  Echocardiogram from December 2018 is outlined above.  Current medicines were reviewed with the patient today.   Orders Placed This  Encounter  Procedures  . EKG 12-Lead    Disposition: Follow-up in 6 months.  Signed, Satira Sark, MD, [facility] 07/14/2018 11:39 AM    [facility] [place], [place], [place] [address] Phone: [phone]; Fax: [phone]

## 2018-08-05 LAB — CUP PACEART REMOTE DEVICE CHECK
Battery Impedance: 2091 Ohm
Battery Remaining Longevity: 25 mo
Battery Voltage: 2.74 V
Brady Statistic AP VP Percent: 97 %
Brady Statistic AP VS Percent: 3 %
Brady Statistic AS VP Percent: 0 %
Brady Statistic AS VS Percent: 0 %
Date Time Interrogation Session: 20191106190152
Implantable Lead Implant Date: 20120815
Implantable Lead Implant Date: 20120815
Implantable Lead Location: 753859
Implantable Lead Location: 753860
Implantable Lead Model: 5076
Implantable Lead Model: 5076
Implantable Pulse Generator Implant Date: 20120815
Lead Channel Impedance Value: 430 Ohm
Lead Channel Impedance Value: 450 Ohm
Lead Channel Pacing Threshold Amplitude: 0.875 V
Lead Channel Pacing Threshold Pulse Width: 0.4 ms
Lead Channel Setting Pacing Amplitude: 2.5 V
Lead Channel Setting Pacing Amplitude: 2.5 V
Lead Channel Setting Pacing Pulse Width: 0.4 ms
Lead Channel Setting Sensing Sensitivity: 2 mV

## 2018-08-31 ENCOUNTER — Ambulatory Visit (INDEPENDENT_AMBULATORY_CARE_PROVIDER_SITE_OTHER): Payer: Medicare Other

## 2018-08-31 DIAGNOSIS — I442 Atrioventricular block, complete: Secondary | ICD-10-CM | POA: Diagnosis not present

## 2018-08-31 DIAGNOSIS — R001 Bradycardia, unspecified: Secondary | ICD-10-CM

## 2018-09-02 LAB — CUP PACEART REMOTE DEVICE CHECK
Battery Impedance: 2164 Ohm
Battery Remaining Longevity: 26 mo
Battery Voltage: 2.73 V
Brady Statistic AP VP Percent: 88 %
Brady Statistic AP VS Percent: 12 %
Brady Statistic AS VP Percent: 0 %
Brady Statistic AS VS Percent: 0 %
Date Time Interrogation Session: 20200205231726
Implantable Lead Implant Date: 20120815
Implantable Lead Implant Date: 20120815
Implantable Lead Location: 753859
Implantable Lead Location: 753860
Implantable Lead Model: 5076
Implantable Lead Model: 5076
Implantable Pulse Generator Implant Date: 20120815
Lead Channel Impedance Value: 451 Ohm
Lead Channel Impedance Value: 480 Ohm
Lead Channel Pacing Threshold Amplitude: 0.875 V
Lead Channel Pacing Threshold Pulse Width: 0.4 ms
Lead Channel Setting Pacing Amplitude: 2.5 V
Lead Channel Setting Pacing Amplitude: 2.5 V
Lead Channel Setting Pacing Pulse Width: 0.4 ms
Lead Channel Setting Sensing Sensitivity: 2 mV

## 2018-09-09 NOTE — Progress Notes (Signed)
Remote pacemaker transmission.   

## 2018-10-01 ENCOUNTER — Other Ambulatory Visit: Payer: Self-pay | Admitting: Internal Medicine

## 2018-10-18 ENCOUNTER — Ambulatory Visit: Payer: Medicare Other | Admitting: Hematology

## 2018-10-18 ENCOUNTER — Other Ambulatory Visit: Payer: Medicare Other

## 2018-11-09 ENCOUNTER — Telehealth: Payer: Self-pay | Admitting: Hematology

## 2018-11-09 NOTE — Telephone Encounter (Signed)
Returning patient's call about rescheduling an appointment, due to concerns of COVID-19 patient wanted appointment moved from April to June.  Message to provider.

## 2018-11-22 ENCOUNTER — Other Ambulatory Visit: Payer: Medicare Other

## 2018-11-22 ENCOUNTER — Ambulatory Visit: Payer: Medicare Other | Admitting: Hematology

## 2018-11-23 ENCOUNTER — Other Ambulatory Visit: Payer: Self-pay | Admitting: Cardiology

## 2018-12-07 ENCOUNTER — Ambulatory Visit (INDEPENDENT_AMBULATORY_CARE_PROVIDER_SITE_OTHER): Payer: Medicare Other | Admitting: *Deleted

## 2018-12-07 ENCOUNTER — Other Ambulatory Visit: Payer: Self-pay

## 2018-12-07 DIAGNOSIS — I442 Atrioventricular block, complete: Secondary | ICD-10-CM

## 2018-12-07 DIAGNOSIS — R001 Bradycardia, unspecified: Secondary | ICD-10-CM

## 2018-12-08 ENCOUNTER — Telehealth: Payer: Self-pay

## 2018-12-08 NOTE — Telephone Encounter (Signed)
Left message for patient to remind of missed remote transmission.  

## 2018-12-09 LAB — CUP PACEART REMOTE DEVICE CHECK
Battery Impedance: 2218 Ohm
Battery Remaining Longevity: 25 mo
Battery Voltage: 2.73 V
Brady Statistic AP VP Percent: 90 %
Brady Statistic AP VS Percent: 10 %
Brady Statistic AS VP Percent: 0 %
Brady Statistic AS VS Percent: 0 %
Date Time Interrogation Session: 20200514173747
Implantable Lead Implant Date: 20120815
Implantable Lead Implant Date: 20120815
Implantable Lead Location: 753859
Implantable Lead Location: 753860
Implantable Lead Model: 5076
Implantable Lead Model: 5076
Implantable Pulse Generator Implant Date: 20120815
Lead Channel Impedance Value: 445 Ohm
Lead Channel Impedance Value: 476 Ohm
Lead Channel Pacing Threshold Amplitude: 0.75 V
Lead Channel Pacing Threshold Pulse Width: 0.4 ms
Lead Channel Setting Pacing Amplitude: 2.5 V
Lead Channel Setting Pacing Amplitude: 2.5 V
Lead Channel Setting Pacing Pulse Width: 0.4 ms
Lead Channel Setting Sensing Sensitivity: 2 mV

## 2018-12-12 ENCOUNTER — Telehealth: Payer: Self-pay | Admitting: Cardiology

## 2018-12-12 MED ORDER — POTASSIUM CHLORIDE CRYS ER 20 MEQ PO TBCR: EXTENDED_RELEASE_TABLET | ORAL | 3 refills | Status: DC

## 2018-12-12 NOTE — Telephone Encounter (Signed)
Done

## 2018-12-12 NOTE — Telephone Encounter (Signed)
°*  STAT* If patient is at the pharmacy, call can be transferred to refill team.   1. Which medications need to be refilled? (please list name of each medication and dose if known) Klor-Con M20 tablet  2. Which pharmacy/location (including street and city if local pharmacy) is medication to be sent to? [facility]  3. Do they need a 30 day or 90 day supply? 90 day supply

## 2018-12-13 ENCOUNTER — Telehealth: Payer: Self-pay | Admitting: Internal Medicine

## 2018-12-13 NOTE — Telephone Encounter (Signed)
Spoke w/ pt wife and informed her that the remote transmission was received. Pt wife verbalized understanding.

## 2018-12-13 NOTE — Telephone Encounter (Signed)
New Message:    Pt wants to know if his transmission was received on last Wednesday?

## 2018-12-15 ENCOUNTER — Other Ambulatory Visit: Payer: Self-pay | Admitting: *Deleted

## 2018-12-15 MED ORDER — POTASSIUM CHLORIDE CRYS ER 20 MEQ PO TBCR: EXTENDED_RELEASE_TABLET | ORAL | 3 refills | Status: DC

## 2018-12-26 NOTE — Progress Notes (Signed)
Marland Kitchen    HEMATOLOGY/ONCOLOGY CLINIC NOTE  Date of Service: 12/26/18   Patient Care Team: Eber Hong, MD as PCP - General (Internal Medicine) Satira Sark, MD as PCP - Cardiology (Cardiology) Thompson Grayer, MD as PCP - Electrophysiology (Cardiology) Zenovia Jarred MD (Gastroenterology)  CHIEF COMPLAINTS F/u for iron deficiency anemia  HISTORY OF PRESENTING ILLNESS:   [name] is a wonderful 85 y.o. male who has been referred to [place] by Dr .Eber Hong, MD  for evaluation and management of Iron deficiency Anemia  He has a history of multiple medical comorbidities including coronary artery disease, chronic systolic CHF, atrial fibrillation on Coumadin, peripheral vascular disease, permanent pacemaker placement, adenomatous colonic polyps and long segment Barrett's esophagus without significant dysplasia on chronic PPI therapy.  Patient has been noting increasing fatigue from earlier this year and was noted to have anemia with hemoglobin of 7.8 with an MCV of 69 in January 2017. Ferritin level was noted to be 19 with an iron saturation of 3%. Fecal occult blood testing in March was noted to be positive for occult GI bleeding.  Patient received IV Feraheme in March and July 2017 for iron replacement. He had an upper endoscopy and colonoscopy which were performed on 11/12/2015. EGD revealed 4 cm of Barrett's esophagus which was biopsied and found to be Barrett's without dysplasia. There was a 3 cm hiatal hernia. Normal stomach and normal duodenum. Duodenal biopsies were negative for celiac. Colonoscopy performed on the same day revealed 2 cecal polyps. 3 ascending colon polyps one of which was 14 mm and removed with hot snare. It was prophylactically endoclipped. The remainder of the examination was unremarkable. Internal hemorrhoids were seen. These polyps were found to be adenomas without high-grade dysplasia. Has not had a capsule endoscopy.  Patient's hemoglobin levels have been  somewhat better in the mid to high 9 range but with persistent iron deficiency and lack of normalization of his anemia.  He has been referred to [place] for further management of his iron deficiency anemia. He continues to be on Coumadin for his atrial fibrillation. He has been on oral iron preparation which is causing some constipation and darker stools. Reports no overt GI bleeding. His last INR was 2.3 yesterday as per his report. He monitors this with his primary care physician. Notes persistent fatigue which is likely multifactorial.  Is on levothyroxine replacement for hypothyroidism. Takes a daily multivitamin.  Patient reports that he notes his weight closely to avoid fluid overload from CHF and notes no weight loss.  No other evidence of overt bleeding. No hematuria no gum bleeds no epistaxis. Notes easy skin bruising. Not using any over-the-counter NSAIDs.   INTERVAL HISTORY   [name] Is here today for management and evaluation for Iron deficiency anemia. The patient's last visit with [place] was on 06/21/18. The pt reports that he is doing well overall.  The pt reports that he has had stable energy levels. The pt notes that he has not observed any blood in the stools nor black stools. He has not required any IV iron replacement in the interim and has not taken PO Iron replacement either. The pt denies chest pain, SOB, nor abdominal pains. He denies any concerns for infections.  Lab results today (12/27/18) of CBC w/diff and CMP is as follows: all values are WNL except for RBC at 3.93, HGB at 11.0, HCT at 35.4%, RDW at 19.6, PLT at 127k, Glucose at 113, Albumin at 3.4, Alk Phos at 134. 12/27/18  Ferritin is pending  On review of systems, pt reports stable energy levels, moving his bowels well, and denies blood in the stools, black stools, CP, SOB, abdominal pains, and any other symptoms.   MEDICAL HISTORY:  Past Medical History:  Diagnosis Date  . Anemia   . Arthritis   . Atrial  flutter (HCC)    Amiodarone therapy, coumadin therapy, prior history of cardioversion and overdrive pacing  . Barrett's esophagus   . Benign prostatic hyperplasia   . Cardiomyopathy    Mixed ischemic and nonischemic with previous EF of 35-40%, most recently in June 2009 normalized to 55-60%. Echo 2/11 EF 55-60%  . Carotid artery disease (HCC)    Less than 50% bilateral internal carotid artery stenosis by Doppler 2009  . Carpal tunnel syndrome   . Chronic diastolic (congestive) heart failure ([place])   . Coronary atherosclerosis of native coronary artery    Status post coronary bypass grafting  . 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 Endocarditis   . Essential hypertension, benign   . Ganglion cyst    right wrist  . GERD (gastroesophageal reflux disease)    PMH  . Hiatal hernia   . HOH (hard of hearing)   . Hyperlipidemia   . Hypothyroidism   . Inguinal hernia    Left  . Internal hemorrhoids   . Iron deficiency anemia   . Obstructive sleep apnea   . Osteoporosis   . Paroxysmal atrial fibrillation (HCC)    Amiodarone therapy, status post Cox-Maze procedure but with recurrent atrial fibrillation while on amiodarone August 2012  . Presence of permanent cardiac pacemaker   . Symptomatic bradycardia   . Tubular adenoma of colon   . 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 Warfarin anticoagulation   . Wears glasses   . Wears partial dentures     SURGICAL HISTORY: Past Surgical History:  Procedure Laterality Date  . AORTIC VALVE REPLACEMENT    . CARPAL TUNNEL RELEASE  2009  . CATARACT EXTRACTION, BILATERAL  07/2011  . COLONOSCOPY W/ BIOPSIES AND POLYPECTOMY    . CORONARY ARTERY BYPASS GRAFT  2008   x2  . HERNIA REPAIR  age 14  . INGUINAL HERNIA REPAIR Left 08/30/2017   Procedure: LEFT INGUINAL HERNIA REPAIR;  Surgeon: Rolm Bookbinder, MD;  Location: [facility];  Service:  General;  Laterality: Left;  GENERAL ETT AND TAP BLOCK  . INSERTION OF MESH Left 08/30/2017   Procedure: INSERTION OF MESH;  Surgeon: Rolm Bookbinder, MD;  Location: [facility];  Service: General;  Laterality: Left;  GENERAL ETT AND TAP BLOCK  . KNEE ARTHROSCOPY    . MASS EXCISION Right 01/26/2018   Procedure: Right wrist dorsal mass excision;  Surgeon: Iran Planas, MD;  Location: [facility];  Service: Orthopedics;  Laterality: Right;  60 mins  . PACEMAKER INSERTION  2009   Initially implanted at [facility], extracted by Dr Lovena Le for endocarditis and reimplanted 8/12  . PATELLA FRACTURE SURGERY  age 25   screw placed and removed  . TEE WITHOUT CARDIOVERSION N/A 07/05/2017   Procedure: TRANSESOPHAGEAL ECHOCARDIOGRAM (TEE);  Surgeon: Sanda Klein, MD;  Location: [facility];  Service: Cardiovascular;  Laterality: N/A;  . TONSILLECTOMY  1939  . TOTAL HIP ARTHROPLASTY      SOCIAL HISTORY: Social History   Socioeconomic History  . Marital status: Married    Spouse name: Not on  file  . Number of children: 3  . Years of education: Not on file  . Highest education level: Not on file  Occupational History  . Occupation: Retired  Scientific laboratory technician  . Financial resource strain: Not on file  . Food insecurity:    Worry: Not on file    Inability: Not on file  . Transportation needs:    Medical: Not on file    Non-medical: Not on file  Tobacco Use  . Smoking status: Former Smoker    Packs/day: 0.30    Years: 8.00    Pack years: 2.40    Types: Cigarettes    Start date: 03/25/1944    Last attempt to quit: 07/27/1954    Years since quitting: 64.4  . Smokeless tobacco: Never Used  Substance and Sexual Activity  . Alcohol use: Yes    Alcohol/week: 0.0 standard drinks    Comment: glass of wine occ  . Drug use: No  . Sexual activity: Not on file  Lifestyle  . Physical activity:    Days per week: Not on file    Minutes per session: Not on file  . Stress: Not on file  Relationships  . Social  connections:    Talks on phone: Not on file    Gets together: Not on file    Attends religious service: Not on file    Active member of club or organization: Not on file    Attends meetings of clubs or organizations: Not on file    Relationship status: Not on file  . Intimate partner violence:    Fear of current or ex partner: Not on file    Emotionally abused: Not on file    Physically abused: Not on file    Forced sexual activity: Not on file  Other Topics Concern  . Not on file  Social History Narrative  . Not on file    FAMILY HISTORY: Family History  Problem Relation Age of Onset  . Coronary artery disease Father   . Other Father        Heart attack or stroke  . Cancer Father   . Colon cancer Mother   . Diabetes Neg Hx   . Hypertension Neg Hx   . Esophageal cancer Neg Hx   . Rectal cancer Neg Hx   . Stomach cancer Neg Hx     ALLERGIES:  has No Known Allergies.  MEDICATIONS:  Current Outpatient Medications  Medication Sig Dispense Refill  . amiodarone (PACERONE) 200 MG tablet TAKE 0.5 (1/2) TABLETS (100 MG TOTAL) BY MOUTH DAILY. 45 tablet 3  . amLODipine (NORVASC) 5 MG tablet Take 5 mg by mouth daily.  3  . benazepril (LOTENSIN) 40 MG tablet TAKE 1 TABLET BY MOUTH EVERY DAY 90 tablet 3  . carvedilol (COREG) 3.125 MG tablet TAKE 1 TABLET (3.125 MG TOTAL) BY MOUTH 2 (TWO) TIMES DAILY. 180 tablet 3  . docusate sodium (COLACE) 100 MG capsule Take 100 mg by mouth at bedtime.     . furosemide (LASIX) 40 MG tablet TAKE 2 TABLETS BY MOUTH IN THE MORNING AND TAKE 1 TAB EVERY EVEVING,MAY TAKE 1 EXTRA TAB IN AM. 360 tablet 2  . levothyroxine (SYNTHROID, LEVOTHROID) 50 MCG tablet Take 50 mcg by mouth daily before breakfast.    . Multiple Vitamins-Minerals (THEREMS M PO) Take 1 tablet by mouth daily.      . nitroGLYCERIN (NITROSTAT) 0.4 MG SL tablet Place 1 tablet (0.4 mg total) under the tongue every  5 (five) minutes as needed. Up to 3 doses, if no relief after 3rd dose, proceed  to the ED for an evaluation. 25 tablet 3  . potassium chloride SA (KLOR-CON M20) 20 MEQ tablet TAKE 20 MEQ BY MOUTH DAILY 90 tablet 3  . pravastatin (PRAVACHOL) 40 MG tablet TAKE 1 TABLET BY MOUTH EVERY EVENING 90 tablet 3  . Probiotic Product (PROBIOTIC DAILY PO) Take 1 tablet by mouth daily.    . psyllium (METAMUCIL) 58.6 % packet Take 1 packet by mouth daily.    Marland Kitchen warfarin (COUMADIN) 2 MG tablet Take 2-3 mg by mouth See admin instructions. Take 3 mg by mouth daily on Monday, Wednesday, Friday. Take 2 mg by mouth daily on all other days.     No current facility-administered medications for this visit.     REVIEW OF SYSTEMS:    A 10+ POINT REVIEW OF SYSTEMS WAS OBTAINED including neurology, dermatology, psychiatry, cardiac, respiratory, lymph, extremities, GI, GU, Musculoskeletal, constitutional, breasts, reproductive, HEENT.  All pertinent positives are noted in the HPI.  All others are negative.   PHYSICAL EXAMINATION:  ECOG PERFORMANCE STATUS: 3 - Symptomatic, >50% confined to bed  . Vitals:   02/21/18 1449  BP: 135/76  Pulse: 60  Resp: 18  Temp: 97.9 F (36.6 C)  SpO2: (!) 90%   There were no vitals filed for this visit. .There is no height or weight on file to calculate BMI.  GENERAL:alert, in no acute distress and comfortable SKIN: no acute rashes, no significant lesions EYES: conjunctiva are pink and non-injected, sclera anicteric OROPHARYNX: MMM, no exudates, no oropharyngeal erythema or ulceration NECK: supple, no JVD LYMPH:  no palpable lymphadenopathy in the cervical, axillary or inguinal regions LUNGS: clear to auscultation b/l with normal respiratory effort HEART: regular rate & rhythm ABDOMEN:  normoactive bowel sounds , non tender, not distended. No palpable hepatosplenomegaly.  Extremity: 1+ pedal edema PSYCH: alert & oriented x 3 with fluent speech NEURO: no focal motor/sensory deficits   LABORATORY DATA:  I have reviewed the data as listed . CBC Latest  Ref Rng & Units 06/21/2018 02/21/2018 01/26/2018  WBC 4.0 - 10.5 K/uL 6.0 4.8 4.8  Hemoglobin 13.0 - 17.0 g/dL 10.9(L) 10.5(L) 11.4(L)  Hematocrit 39.0 - 52.0 % 35.6(L) 33.4(L) 37.9(L)  Platelets 150 - 400 K/uL 123(L) 108(L) 162    . CMP Latest Ref Rng & Units 02/21/2018 01/26/2018 11/02/2017  Glucose 70 - 99 mg/dL 109(H) 82 113  BUN 8 - 23 mg/dL '15 14 16  ' Creatinine 0.61 - 1.24 mg/dL 0.94 0.92 1.10  Sodium 135 - 145 mmol/L 139 141 142  Potassium 3.5 - 5.1 mmol/L 3.7 4.3 3.8  Chloride 98 - 111 mmol/L 104 109 108  CO2 22 - 32 mmol/L '29 22 27  ' Calcium 8.9 - 10.3 mg/dL 9.3 9.3 9.3  Total Protein 6.4 - 8.3 g/dL - - 6.6  Total Bilirubin 0.2 - 1.2 mg/dL - - 0.5  Alkaline Phos 40 - 150 U/L - - 107  AST 5 - 34 U/L - - 18  ALT 0 - 55 U/L - - 16    . Lab Results  Component Value Date   IRON 49 06/21/2018   TIBC 303 06/21/2018   IRONPCTSAT 16 (L) 06/21/2018   (Iron and TIBC)  Lab Results  Component Value Date   FERRITIN 166 06/21/2018   . Lab Results  Component Value Date   IRON 46 12/27/2018   TIBC 369 12/27/2018   IRONPCTSAT 12 (L)  12/27/2018   (Iron and TIBC)  Lab Results  Component Value Date   FERRITIN 105 12/27/2018    EGD 11/12/2015 - Esophageal mucosal changes suggestive of long-segment Barrett's esophagus. Biopsied. - 3 cm hiatal hernia. - Normal stomach. - Normal examined duodenum. Biopsied.  Colonoscopy 11/12/2015 - Two 4 to 5 mm polyps in the cecum, removed with a cold snare. Resected and retrieved. - Two 4 to 5 mm polyps in the ascending colon, removed with a cold snare. Resected and Retrieved - One 14 mm polyp in the ascending colon, removed with a hot snare. Resected and retrieved. Clip was placed. - Internal hemorrhoids.   RADIOGRAPHIC STUDIES: I have personally reviewed the radiological images as listed and agreed with the findings in the report. No results found.  ASSESSMENT & PLAN:   85 y.o.Caucasian male with multiple medical comorbidities with   1) Microcytic Anemia -predominantly related to iron deficiency from chronic GI losses.  Patient has been on chronic anticoagulation for his atrial fibrillation and this will continue to be a risk factor from going GI losses. He has received IV Feraheme 2 in March and July 2017 without complete correction of his iron deficiency. This is suggestive of ongoing GI losses. Initial workup as noted below showed no monoclonal protein. No evidence of hemolysis with normal LDH and haptoglobin. Patient had IV Injectafer 756m qweekly x2 doses on 05/22/2016 and 05/29/2016. He has multiple potential sources of GI bleeding especially when on Coumadin.  2) Long segment Barrett's esophagus without dysplasia.  3) Hiatal hernia  4) multiple adenomatous colonic polyps.  PLAN:  -Discussed pt labwork today, 12/27/18; HGB holding at 11.0 with MCV at 90.1. WBC normal. PLT stable at 127k -12/27/18 Ferritin is 105 -Goal for Ferritin >100 -Will continue with IV Injectafer if Ferritin <100. No indication at this time. -Patient was previously counseled to have a discussion with his gastroenterologist, primary care physician and cardiologist regarding the risk versus benefit of ongoing anticoagulation in the setting of ongoing GI bleeding. -Suspect ongoing GI blood loss as pt's ferritin has decreased  -Will see the pt back in 6 months   5). Patient Active Problem List   Diagnosis Date Noted  . Inguinal hernia 08/30/2017  . Bacteremia   . Hypoxia 07/03/2017  . Iron deficiency anemia due to chronic blood loss 05/14/2016  . Barrett's esophagus without dysplasia 01/22/2016  . Anemia 08/01/2015  . Symptomatic anemia 08/01/2015  . Chronic diastolic CHF (congestive heart failure) ([place] 08/01/2015  . Sick sinus syndrome ([place] 03/20/2013  . Pacemaker 11/24/2011  . Warfarin anticoagulation   . Paroxysmal atrial fibrillation (HCC)   . Valvular heart disease   . [place]rial [place]   . Carotid artery disease ([place]   .  History of endocarditis   . HYPERLIPIDEMIA 10/10/2010  . Obstructive sleep apnea 11/01/2009  . Hypertensive cardiovascular disease 04/10/2009  . Coronary atherosclerosis of native coronary artery 04/10/2009  . Secondary cardiomyopathy ([place] 04/10/2009   -continue f/u with cardiology and PCP as per their recommendations.   RTC with dr KIrene Limbowith labs in 6 months   All of the patients questions were answered with apparent satisfaction. The patient knows to call the clinic with any problems, questions or concerns.  The total time spent in the appt was 15 minutes and more than 50% was on counseling and direct patient cares.   GSullivan LoneMD MS AAHIVMS [facility]Pa[facility] LPCGood Hope HospitalHematology/Oncology Physician [facility] (Office):       [phone](Work  cell):  [phone] (Fax):           [phone]  I, Baldwin Jamaica, am acting as a scribe for Dr. Sullivan Lone.   .I have reviewed the above documentation for accuracy and completeness, and I agree with the above. Brunetta Genera MD

## 2018-12-27 ENCOUNTER — Inpatient Hospital Stay: Payer: Medicare Other | Attending: Hematology

## 2018-12-27 ENCOUNTER — Other Ambulatory Visit: Payer: Self-pay

## 2018-12-27 ENCOUNTER — Telehealth: Payer: Self-pay | Admitting: Hematology

## 2018-12-27 ENCOUNTER — Inpatient Hospital Stay (HOSPITAL_BASED_OUTPATIENT_CLINIC_OR_DEPARTMENT_OTHER): Payer: Medicare Other | Admitting: Hematology

## 2018-12-27 VITALS — BP 136/78 | HR 62 | Temp 98.2°F | Resp 8 | Ht 66.0 in | Wt 167.0 lb

## 2018-12-27 DIAGNOSIS — Z87891 Personal history of nicotine dependence: Secondary | ICD-10-CM | POA: Diagnosis not present

## 2018-12-27 DIAGNOSIS — E785 Hyperlipidemia, unspecified: Secondary | ICD-10-CM | POA: Diagnosis not present

## 2018-12-27 DIAGNOSIS — D500 Iron deficiency anemia secondary to blood loss (chronic): Secondary | ICD-10-CM | POA: Insufficient documentation

## 2018-12-27 DIAGNOSIS — Z7901 Long term (current) use of anticoagulants: Secondary | ICD-10-CM

## 2018-12-27 DIAGNOSIS — K2270 Barrett's esophagus without dysplasia: Secondary | ICD-10-CM

## 2018-12-27 DIAGNOSIS — I110 Hypertensive heart disease with heart failure: Secondary | ICD-10-CM | POA: Insufficient documentation

## 2018-12-27 DIAGNOSIS — K5900 Constipation, unspecified: Secondary | ICD-10-CM | POA: Diagnosis not present

## 2018-12-27 DIAGNOSIS — I480 Paroxysmal atrial fibrillation: Secondary | ICD-10-CM | POA: Insufficient documentation

## 2018-12-27 DIAGNOSIS — Z950 Presence of cardiac pacemaker: Secondary | ICD-10-CM | POA: Diagnosis not present

## 2018-12-27 DIAGNOSIS — K219 Gastro-esophageal reflux disease without esophagitis: Secondary | ICD-10-CM | POA: Insufficient documentation

## 2018-12-27 DIAGNOSIS — R5383 Other fatigue: Secondary | ICD-10-CM

## 2018-12-27 DIAGNOSIS — Z79899 Other long term (current) drug therapy: Secondary | ICD-10-CM | POA: Diagnosis not present

## 2018-12-27 DIAGNOSIS — I5042 Chronic combined systolic (congestive) and diastolic (congestive) heart failure: Secondary | ICD-10-CM | POA: Insufficient documentation

## 2018-12-27 DIAGNOSIS — E039 Hypothyroidism, unspecified: Secondary | ICD-10-CM | POA: Diagnosis not present

## 2018-12-27 DIAGNOSIS — Z952 Presence of prosthetic heart valve: Secondary | ICD-10-CM

## 2018-12-27 DIAGNOSIS — Z8249 Family history of ischemic heart disease and other diseases of the circulatory system: Secondary | ICD-10-CM | POA: Insufficient documentation

## 2018-12-27 LAB — CBC WITH DIFFERENTIAL/PLATELET
Abs Immature Granulocytes: 0.01 10*3/uL (ref 0.00–0.07)
Basophils Absolute: 0.1 10*3/uL (ref 0.0–0.1)
Basophils Relative: 2 %
Eosinophils Absolute: 0.1 10*3/uL (ref 0.0–0.5)
Eosinophils Relative: 3 %
HCT: 35.4 % — ABNORMAL LOW (ref 39.0–52.0)
Hemoglobin: 11 g/dL — ABNORMAL LOW (ref 13.0–17.0)
Immature Granulocytes: 0 %
Lymphocytes Relative: 15 %
Lymphs Abs: 0.7 10*3/uL (ref 0.7–4.0)
MCH: 28 pg (ref 26.0–34.0)
MCHC: 31.1 g/dL (ref 30.0–36.0)
MCV: 90.1 fL (ref 80.0–100.0)
Monocytes Absolute: 0.5 10*3/uL (ref 0.1–1.0)
Monocytes Relative: 11 %
Neutro Abs: 3.1 10*3/uL (ref 1.7–7.7)
Neutrophils Relative %: 69 %
Platelets: 127 10*3/uL — ABNORMAL LOW (ref 150–400)
RBC: 3.93 MIL/uL — ABNORMAL LOW (ref 4.22–5.81)
RDW: 19.6 % — ABNORMAL HIGH (ref 11.5–15.5)
WBC: 4.4 10*3/uL (ref 4.0–10.5)
nRBC: 0 % (ref 0.0–0.2)

## 2018-12-27 LAB — CMP (CANCER CENTER ONLY)
ALT: 22 U/L (ref 0–44)
AST: 32 U/L (ref 15–41)
Albumin: 3.4 g/dL — ABNORMAL LOW (ref 3.5–5.0)
Alkaline Phosphatase: 134 U/L — ABNORMAL HIGH (ref 38–126)
Anion gap: 7 (ref 5–15)
BUN: 21 mg/dL (ref 8–23)
CO2: 27 mmol/L (ref 22–32)
Calcium: 9.1 mg/dL (ref 8.9–10.3)
Chloride: 106 mmol/L (ref 98–111)
Creatinine: 1.07 mg/dL (ref 0.61–1.24)
GFR, Est AFR Am: >60 mL/min (ref >60)
GFR, Estimated: >60 mL/min (ref >60)
Glucose, Bld: 113 mg/dL — ABNORMAL HIGH (ref 70–99)
Potassium: 3.9 mmol/L (ref 3.5–5.1)
Sodium: 140 mmol/L (ref 135–145)
Total Bilirubin: 1.2 mg/dL (ref 0.3–1.2)
Total Protein: 6.8 g/dL (ref 6.5–8.1)

## 2018-12-27 LAB — IRON AND TIBC
Iron: 46 ug/dL (ref 42–163)
Saturation Ratios: 12 % — ABNORMAL LOW (ref 20–55)
TIBC: 369 ug/dL (ref 202–409)
UIBC: 323 ug/dL (ref 117–376)

## 2018-12-27 LAB — FERRITIN: Ferritin: 105 ng/mL (ref 24–336)

## 2018-12-27 NOTE — Progress Notes (Signed)
Remote pacemaker transmission.   

## 2018-12-27 NOTE — Telephone Encounter (Signed)
Scheduled appt per 6/2 los.  A calendar will be mailed.

## 2019-03-09 ENCOUNTER — Ambulatory Visit (INDEPENDENT_AMBULATORY_CARE_PROVIDER_SITE_OTHER): Payer: Medicare Other | Admitting: *Deleted

## 2019-03-09 DIAGNOSIS — I495 Sick sinus syndrome: Secondary | ICD-10-CM | POA: Diagnosis not present

## 2019-03-10 LAB — CUP PACEART REMOTE DEVICE CHECK
Battery Impedance: 2891 Ohm
Battery Remaining Longevity: 19 mo
Battery Voltage: 2.72 V
Brady Statistic AP VP Percent: 90 %
Brady Statistic AP VS Percent: 10 %
Brady Statistic AS VP Percent: 0 %
Brady Statistic AS VS Percent: 0 %
Date Time Interrogation Session: 20200813160738
Implantable Lead Implant Date: 20120815
Implantable Lead Implant Date: 20120815
Implantable Lead Location: 753859
Implantable Lead Location: 753860
Implantable Lead Model: 5076
Implantable Lead Model: 5076
Implantable Pulse Generator Implant Date: 20120815
Lead Channel Impedance Value: 452 Ohm
Lead Channel Impedance Value: 501 Ohm
Lead Channel Pacing Threshold Amplitude: 0.875 V
Lead Channel Pacing Threshold Pulse Width: 0.4 ms
Lead Channel Setting Pacing Amplitude: 2.5 V
Lead Channel Setting Pacing Amplitude: 2.5 V
Lead Channel Setting Pacing Pulse Width: 0.4 ms
Lead Channel Setting Sensing Sensitivity: 2 mV

## 2019-03-20 ENCOUNTER — Encounter: Payer: Self-pay | Admitting: Cardiology

## 2019-03-20 NOTE — Progress Notes (Signed)
Remote pacemaker transmission.   

## 2019-04-06 ENCOUNTER — Ambulatory Visit: Payer: Medicare Other | Admitting: Cardiology

## 2019-04-21 ENCOUNTER — Inpatient Hospital Stay (HOSPITAL_COMMUNITY)
Admission: EM | Admit: 2019-04-21 | Discharge: 2019-04-26 | DRG: 291 | Disposition: A | Discharge status: Home Health | Payer: Medicare Other | Attending: Internal Medicine | Admitting: Internal Medicine

## 2019-04-21 ENCOUNTER — Telehealth: Payer: Self-pay | Admitting: Cardiology

## 2019-04-21 ENCOUNTER — Emergency Department (HOSPITAL_COMMUNITY): Payer: Medicare Other

## 2019-04-21 ENCOUNTER — Other Ambulatory Visit: Payer: Self-pay

## 2019-04-21 ENCOUNTER — Encounter (HOSPITAL_COMMUNITY): Payer: Self-pay

## 2019-04-21 DIAGNOSIS — I428 Other cardiomyopathies: Secondary | ICD-10-CM | POA: Diagnosis present

## 2019-04-21 DIAGNOSIS — Z7901 Long term (current) use of anticoagulants: Secondary | ICD-10-CM

## 2019-04-21 DIAGNOSIS — E785 Hyperlipidemia, unspecified: Secondary | ICD-10-CM | POA: Diagnosis present

## 2019-04-21 DIAGNOSIS — I4892 Unspecified atrial flutter: Secondary | ICD-10-CM | POA: Diagnosis present

## 2019-04-21 DIAGNOSIS — Z951 Presence of aortocoronary bypass graft: Secondary | ICD-10-CM

## 2019-04-21 DIAGNOSIS — Z8679 Personal history of other diseases of the circulatory system: Secondary | ICD-10-CM

## 2019-04-21 DIAGNOSIS — Z66 Do not resuscitate: Secondary | ICD-10-CM | POA: Diagnosis present

## 2019-04-21 DIAGNOSIS — I130 Hypertensive heart and chronic kidney disease with heart failure and stage 1 through stage 4 chronic kidney disease, or unspecified chronic kidney disease: Principal | ICD-10-CM | POA: Diagnosis present

## 2019-04-21 DIAGNOSIS — Z953 Presence of xenogenic heart valve: Secondary | ICD-10-CM

## 2019-04-21 DIAGNOSIS — Z20828 Contact with and (suspected) exposure to other viral communicable diseases: Secondary | ICD-10-CM | POA: Diagnosis present

## 2019-04-21 DIAGNOSIS — R600 Localized edema: Secondary | ICD-10-CM | POA: Diagnosis present

## 2019-04-21 DIAGNOSIS — Z79899 Other long term (current) drug therapy: Secondary | ICD-10-CM

## 2019-04-21 DIAGNOSIS — I5043 Acute on chronic combined systolic (congestive) and diastolic (congestive) heart failure: Secondary | ICD-10-CM | POA: Diagnosis present

## 2019-04-21 DIAGNOSIS — I5082 Biventricular heart failure: Secondary | ICD-10-CM | POA: Diagnosis present

## 2019-04-21 DIAGNOSIS — H9190 Unspecified hearing loss, unspecified ear: Secondary | ICD-10-CM | POA: Diagnosis present

## 2019-04-21 DIAGNOSIS — N179 Acute kidney failure, unspecified: Secondary | ICD-10-CM | POA: Diagnosis present

## 2019-04-21 DIAGNOSIS — Z952 Presence of prosthetic heart valve: Secondary | ICD-10-CM | POA: Diagnosis not present

## 2019-04-21 DIAGNOSIS — E876 Hypokalemia: Secondary | ICD-10-CM | POA: Diagnosis present

## 2019-04-21 DIAGNOSIS — I081 Rheumatic disorders of both mitral and tricuspid valves: Secondary | ICD-10-CM | POA: Diagnosis present

## 2019-04-21 DIAGNOSIS — I509 Heart failure, unspecified: Secondary | ICD-10-CM | POA: Diagnosis not present

## 2019-04-21 DIAGNOSIS — R791 Abnormal coagulation profile: Secondary | ICD-10-CM | POA: Diagnosis present

## 2019-04-21 DIAGNOSIS — Z87891 Personal history of nicotine dependence: Secondary | ICD-10-CM

## 2019-04-21 DIAGNOSIS — I442 Atrioventricular block, complete: Secondary | ICD-10-CM | POA: Diagnosis present

## 2019-04-21 DIAGNOSIS — K449 Diaphragmatic hernia without obstruction or gangrene: Secondary | ICD-10-CM | POA: Diagnosis present

## 2019-04-21 DIAGNOSIS — I480 Paroxysmal atrial fibrillation: Secondary | ICD-10-CM | POA: Diagnosis present

## 2019-04-21 DIAGNOSIS — Z95810 Presence of automatic (implantable) cardiac defibrillator: Secondary | ICD-10-CM

## 2019-04-21 DIAGNOSIS — I2510 Atherosclerotic heart disease of native coronary artery without angina pectoris: Secondary | ICD-10-CM | POA: Diagnosis present

## 2019-04-21 DIAGNOSIS — K2270 Barrett's esophagus without dysplasia: Secondary | ICD-10-CM | POA: Diagnosis present

## 2019-04-21 DIAGNOSIS — Z9989 Dependence on other enabling machines and devices: Secondary | ICD-10-CM | POA: Diagnosis not present

## 2019-04-21 DIAGNOSIS — N400 Enlarged prostate without lower urinary tract symptoms: Secondary | ICD-10-CM | POA: Diagnosis present

## 2019-04-21 DIAGNOSIS — D631 Anemia in chronic kidney disease: Secondary | ICD-10-CM | POA: Diagnosis present

## 2019-04-21 DIAGNOSIS — I340 Nonrheumatic mitral (valve) insufficiency: Secondary | ICD-10-CM | POA: Diagnosis not present

## 2019-04-21 DIAGNOSIS — D696 Thrombocytopenia, unspecified: Secondary | ICD-10-CM | POA: Diagnosis present

## 2019-04-21 DIAGNOSIS — D509 Iron deficiency anemia, unspecified: Secondary | ICD-10-CM | POA: Diagnosis present

## 2019-04-21 DIAGNOSIS — Z8249 Family history of ischemic heart disease and other diseases of the circulatory system: Secondary | ICD-10-CM

## 2019-04-21 DIAGNOSIS — I361 Nonrheumatic tricuspid (valve) insufficiency: Secondary | ICD-10-CM | POA: Diagnosis not present

## 2019-04-21 DIAGNOSIS — I495 Sick sinus syndrome: Secondary | ICD-10-CM | POA: Diagnosis present

## 2019-04-21 DIAGNOSIS — K219 Gastro-esophageal reflux disease without esophagitis: Secondary | ICD-10-CM | POA: Diagnosis present

## 2019-04-21 DIAGNOSIS — R5381 Other malaise: Secondary | ICD-10-CM | POA: Diagnosis present

## 2019-04-21 DIAGNOSIS — G4733 Obstructive sleep apnea (adult) (pediatric): Secondary | ICD-10-CM | POA: Diagnosis present

## 2019-04-21 DIAGNOSIS — N183 Chronic kidney disease, stage 3 unspecified: Secondary | ICD-10-CM

## 2019-04-21 DIAGNOSIS — Z79890 Hormone replacement therapy (postmenopausal): Secondary | ICD-10-CM

## 2019-04-21 DIAGNOSIS — E039 Hypothyroidism, unspecified: Secondary | ICD-10-CM | POA: Diagnosis present

## 2019-04-21 LAB — CBC
HCT: 33 % — ABNORMAL LOW (ref 39.0–52.0)
Hemoglobin: 10.5 g/dL — ABNORMAL LOW (ref 13.0–17.0)
MCH: 28.2 pg (ref 26.0–34.0)
MCHC: 31.8 g/dL (ref 30.0–36.0)
MCV: 88.7 fL (ref 80.0–100.0)
Platelets: 88 10*3/uL — ABNORMAL LOW (ref 150–400)
RBC: 3.72 MIL/uL — ABNORMAL LOW (ref 4.22–5.81)
RDW: 18.7 % — ABNORMAL HIGH (ref 11.5–15.5)
WBC: 3.8 10*3/uL — ABNORMAL LOW (ref 4.0–10.5)
nRBC: 0 % (ref 0.0–0.2)

## 2019-04-21 LAB — PROTIME-INR
INR: 2.3 — ABNORMAL HIGH (ref 0.8–1.2)
Prothrombin Time: 24.7 seconds — ABNORMAL HIGH (ref 11.4–15.2)

## 2019-04-21 LAB — BASIC METABOLIC PANEL
Anion gap: 8 (ref 5–15)
BUN: 24 mg/dL — ABNORMAL HIGH (ref 8–23)
CO2: 26 mmol/L (ref 22–32)
Calcium: 9 mg/dL (ref 8.9–10.3)
Chloride: 103 mmol/L (ref 98–111)
Creatinine, Ser: 1.31 mg/dL — ABNORMAL HIGH (ref 0.61–1.24)
GFR calc Af Amer: 56 mL/min — ABNORMAL LOW (ref >60)
GFR calc non Af Amer: 48 mL/min — ABNORMAL LOW (ref >60)
Glucose, Bld: 109 mg/dL — ABNORMAL HIGH (ref 70–99)
Potassium: 3.8 mmol/L (ref 3.5–5.1)
Sodium: 137 mmol/L (ref 135–145)

## 2019-04-21 LAB — BRAIN NATRIURETIC PEPTIDE: B Natriuretic Peptide: 1054.2 pg/mL — ABNORMAL HIGH (ref 0.0–100.0)

## 2019-04-21 MED ORDER — FUROSEMIDE 10 MG/ML IJ SOLN
40.0000 mg | Freq: Once | INTRAMUSCULAR | Status: AC
  Administered 2019-04-21: 40 mg via INTRAVENOUS
  Filled 2019-04-21: qty 4

## 2019-04-21 MED ORDER — SODIUM CHLORIDE 0.9% FLUSH
3.0000 mL | Freq: Once | INTRAVENOUS | Status: AC
  Administered 2019-04-22: 08:00:00 3 mL via INTRAVENOUS

## 2019-04-21 MED ORDER — WARFARIN - PHARMACIST DOSING INPATIENT: Freq: Every day | Status: DC

## 2019-04-21 NOTE — Telephone Encounter (Signed)
That is already a fairly high dose.  I am concerned that he may actually need to undergo IV diuresis if he has weeping and continued leg swelling on a total of 140 mg of Lasix daily.  If she feels like he is worsening, it may be best to take him to the ER at [facility] in anticipation of hospitalization and IV diuresis with careful follow-up of renal function.  If she brings him to [facility], we will not be able to follow him over the weekend.

## 2019-04-21 NOTE — Telephone Encounter (Signed)
He has taken more than that in the past to control edema.  He does not like to increase diuretics usually because of how much it makes him urinate.  I think if he is having worsening symptoms we ought to consider increasing Lasix to 80 mg in the morning and 40 mg in the afternoon for at least the next 3 days to see if symptoms start to improve.  If that helps, we will probably have to increase his standing Lasix dose.

## 2019-04-21 NOTE — ED Provider Notes (Signed)
Medical screening examination/treatment/procedure(s) were conducted as a shared visit with non-physician practitioner(s) and myself.  I personally evaluated the patient during the encounter.  EKG Interpretation  Date/Time:  Friday April 21 2019 16:06:00 EDT Ventricular Rate:  60 PR Interval:    QRS Duration: 212 QT Interval:  528 QTC Calculation: 528 R Axis:   -129 Text Interpretation:  AV dual-paced rhythm Abnormal ECG since last tracing no significant change Confirmed by Noemi Chapel [phone]) on 04/21/2019 5:10:23 PM   This 85 year old male presents with increasing shortness of breath as well as diffuse bilateral lower extremity edema and weeping of the legs.  Has taken a large dose of Lasix this morning amounting to approximately 160 mg, cardiology recommended that the patient come to the hospital for IV diuresis and possible admission.  I think this is reasonable given his elevated creatinine.  On my exam the patient has significant and severe bilateral edema with some weeping, mild tachypnea, loud systolic ejection murmur consistent with patient's prior history of bioprosthetic aortic valve with stenosis.  On Coumadin  The patient appears tachypneic with something simple is standing.  Final diagnoses:  Bilateral lower extremity edema      Noemi Chapel, MD 04/22/19 [phone]

## 2019-04-21 NOTE — ED Triage Notes (Signed)
Pt accompanied by wife who states pt has had increased leg swelling, changes made to his diuretics but wife reports legs started to weep last night. Pt also having some SOB, no dyspnea noted.

## 2019-04-21 NOTE — Telephone Encounter (Signed)
Pt wife agreeable to ED evaluation at [facility] they will go this afternoon - wife says he has already taken 4 tablets of the 40 mg lasix this morning

## 2019-04-21 NOTE — Telephone Encounter (Signed)
Pt takes lasix (4) 20 mg tablets for lasix daily for the last 2 weeks with legs continuing to swell - yesterday left leg started seeping fluid - went to pcp this week and told pt to contact our office - pt has appt with [name] 10/6 - says weight ranges from 157lbs-165lbs - c/o SOB/weakness and cannot walk as far as normal

## 2019-04-21 NOTE — Telephone Encounter (Signed)
Please clarify Lasix dose, what is mentioned here is not what was listed in the chart or at his last office visit.  I need to know exactly what he has been taking over the last few weeks so that we can make adjustments.

## 2019-04-21 NOTE — Progress Notes (Signed)
ANTICOAGULATION CONSULT NOTE - Initial Consult  Pharmacy Consult for Warfarin Indication: atrial fibrillation  No Known Allergies  Patient Measurements:     Vital Signs: Temp: 98.6 F (37 C) (09/25 1534) Temp Source: Oral (09/25 1534) BP: 145/84 (09/25 2000) Pulse Rate: 60 (09/25 2000)  Labs: Recent Labs    04/21/19 1611 04/21/19 1634  HGB 10.5*  --   HCT 33.0*  --   PLT 88*  --   LABPROT  --  24.7*  INR  --  2.3*  CREATININE 1.31*  --     CrCl cannot be calculated (Unknown ideal weight.).   Medical History: Past Medical History:  Diagnosis Date  . Anemia   . Arthritis   . Atrial flutter (HCC)    Amiodarone therapy, coumadin therapy, prior history of cardioversion and overdrive pacing  . Barrett's esophagus   . Benign prostatic hyperplasia   . Cardiomyopathy    Mixed ischemic and nonischemic with previous EF of 35-40%, most recently in June 2009 normalized to 55-60%. Echo 2/11 EF 55-60%  . Carotid artery disease (HCC)    Less than 50% bilateral internal carotid artery stenosis by Doppler 2009  . Carpal tunnel syndrome   . Chronic diastolic (congestive) heart failure ([place])   . Coronary atherosclerosis of native coronary artery    Status post coronary bypass grafting  . 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 Endocarditis   . Essential hypertension, benign   . Ganglion cyst    right wrist  . GERD (gastroesophageal reflux disease)    PMH  . Hiatal hernia   . HOH (hard of hearing)   . Hyperlipidemia   . Hypothyroidism   . Inguinal hernia    Left  . Internal hemorrhoids   . Iron deficiency anemia   . Obstructive sleep apnea   . Osteoporosis   . Paroxysmal atrial fibrillation (HCC)    Amiodarone therapy, status post Cox-Maze procedure but with recurrent atrial fibrillation while on amiodarone August 2012  . Presence of permanent cardiac pacemaker   . Symptomatic bradycardia   . Tubular adenoma  of colon   . 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 Warfarin anticoagulation   . Wears glasses   . Wears partial dentures     Medications:  Scheduled:  . sodium chloride flush  3 mL Intravenous Once  . [START ON 04/22/2019] Warfarin - Pharmacist Dosing Inpatient   Does not apply q1800    Assessment: 85 y.o. presenting with leg swelling. PMH significant for CHF, CAD, HTN, OSA, and Atrial Flutter on Warfarin PTA - 3 mg on Sundays and 2 mg all other days (Last dose 9/25 @10AM ). Admission INR therapeutic at 2.3. Hgb 10.5, Plts 88, Scr 1.31. Pharmacy consulted for warfarin dosing.  Goal of Therapy:  INR 2-3 Monitor platelets by anticoagulation protocol: Yes   Plan:  No Warfarin tonight Daily INR Monitor s/sx of bleeding and platelets   Lorel Monaco, PharmD PGY1 Ambulatory Care Resident [employer] # [phone]

## 2019-04-21 NOTE — ED Provider Notes (Signed)
[facility] EMERGENCY DEPARTMENT Provider Note   CSN: [id number] Arrival date & time: 04/21/19  1527     History   Chief Complaint Chief Complaint  Patient presents with  . Leg Swelling    HPI [name] is a 85 y.o. male with past history significant for CHF, aberrancy, CAD, atrial flutter, history of endocarditis, sick sinus syndrome, HTN, and OSA admitted who presents to the ED accompanied by his wife for increased leg swelling.  He has daily weights and endorses a 5 pound weight gain over the course of the past couple weeks.  That prompted him to have his diuretics adjusted by his cardiologist approximately 1 week ago, but the leg swelling has continued.  He also reports shortness of breath, particularly with exertion.  He ambulates with the assistance of a walker and evidently his dyspnea on exertion is chronic, but it has worsened these past couple weeks.  He uses compression stockings daily and elevates his legs at night.  Patient reportedly took 140 mg Lasix this morning.  He denies any lower leg discomfort, redness, unilateral swelling, or recent immobilization.  He also denies any fever, chills, headache, lightheadedness, cough, chest pain, abdominal discomfort, nausea, vomiting, or other symptoms.     HPI  Past Medical History:  Diagnosis Date  . Anemia   . Arthritis   . Atrial flutter (HCC)    Amiodarone therapy, coumadin therapy, prior history of cardioversion and overdrive pacing  . Barrett's esophagus   . Benign prostatic hyperplasia   . Cardiomyopathy    Mixed ischemic and nonischemic with previous EF of 35-40%, most recently in June 2009 normalized to 55-60%. Echo 2/11 EF 55-60%  . Carotid artery disease (HCC)    Less than 50% bilateral internal carotid artery stenosis by Doppler 2009  . Carpal tunnel syndrome   . Chronic diastolic (congestive) heart failure ([place])   . Coronary atherosclerosis of native coronary artery    Status post coronary  bypass grafting  . 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 Endocarditis   . Essential hypertension, benign   . Ganglion cyst    right wrist  . GERD (gastroesophageal reflux disease)    PMH  . Hiatal hernia   . HOH (hard of hearing)   . Hyperlipidemia   . Hypothyroidism   . Inguinal hernia    Left  . Internal hemorrhoids   . Iron deficiency anemia   . Obstructive sleep apnea   . Osteoporosis   . Paroxysmal atrial fibrillation (HCC)    Amiodarone therapy, status post Cox-Maze procedure but with recurrent atrial fibrillation while on amiodarone August 2012  . Presence of permanent cardiac pacemaker   . Symptomatic bradycardia   . Tubular adenoma of colon   . 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 Warfarin anticoagulation   . Wears glasses   . Wears partial dentures     Patient Active Problem List   Diagnosis Date Noted  . Inguinal hernia 08/30/2017  . Bacteremia   . Hypoxia 07/03/2017  . Iron deficiency anemia due to chronic blood loss 05/14/2016  . Barrett's esophagus without dysplasia 01/22/2016  . Anemia 08/01/2015  . Symptomatic anemia 08/01/2015  . Chronic diastolic CHF (congestive heart failure) ([place]) 08/01/2015  . Sick sinus syndrome ([place]) 03/20/2013  . Pacemaker 11/24/2011  . Warfarin anticoagulation   . Paroxysmal atrial fibrillation (HCC)   .  Valvular heart disease   . Atrial flutter ([place])   . Carotid artery disease ([place])   . History of endocarditis   . HYPERLIPIDEMIA 10/10/2010  . Obstructive sleep apnea 11/01/2009  . Hypertensive cardiovascular disease 04/10/2009  . Coronary atherosclerosis of native coronary artery 04/10/2009  . Secondary cardiomyopathy ([place]) 04/10/2009    Past Surgical History:  Procedure Laterality Date  . AORTIC VALVE REPLACEMENT    . CARPAL TUNNEL RELEASE  2009  . CATARACT EXTRACTION, BILATERAL  07/2011  . COLONOSCOPY  W/ BIOPSIES AND POLYPECTOMY    . CORONARY ARTERY BYPASS GRAFT  2008   x2  . HERNIA REPAIR  age 13  . INGUINAL HERNIA REPAIR Left 08/30/2017   Procedure: LEFT INGUINAL HERNIA REPAIR;  Surgeon: Rolm Bookbinder, MD;  Location: [facility];  Service: General;  Laterality: Left;  GENERAL ETT AND TAP BLOCK  . INSERTION OF MESH Left 08/30/2017   Procedure: INSERTION OF MESH;  Surgeon: Rolm Bookbinder, MD;  Location: [facility];  Service: General;  Laterality: Left;  GENERAL ETT AND TAP BLOCK  . KNEE ARTHROSCOPY    . MASS EXCISION Right 01/26/2018   Procedure: Right wrist dorsal mass excision;  Surgeon: Iran Planas, MD;  Location: [facility];  Service: Orthopedics;  Laterality: Right;  60 mins  . PACEMAKER INSERTION  2009   Initially implanted at [facility], extracted by Dr Lovena Le for endocarditis and reimplanted 8/12  . PATELLA FRACTURE SURGERY  age 77   screw placed and removed  . TEE WITHOUT CARDIOVERSION N/A 07/05/2017   Procedure: TRANSESOPHAGEAL ECHOCARDIOGRAM (TEE);  Surgeon: Sanda Klein, MD;  Location: [facility];  Service: Cardiovascular;  Laterality: N/A;  . TONSILLECTOMY  1939  . TOTAL HIP ARTHROPLASTY          Home Medications    Prior to Admission medications   Medication Sig Start Date End Date Taking? Authorizing Provider  amiodarone (PACERONE) 200 MG tablet TAKE 0.5 (1/2) TABLETS (100 MG TOTAL) BY MOUTH DAILY. 10/03/18   Allred, Jeneen Rinks, MD  amLODipine (NORVASC) 5 MG tablet Take 5 mg by mouth daily. 01/18/17   [provider]  benazepril (LOTENSIN) 40 MG tablet TAKE 1 TABLET BY MOUTH EVERY DAY 07/01/18   Satira Sark, MD  carvedilol (COREG) 3.125 MG tablet TAKE 1 TABLET (3.125 MG TOTAL) BY MOUTH 2 (TWO) TIMES DAILY. 06/22/18   Satira Sark, MD  docusate sodium (COLACE) 100 MG capsule Take 100 mg by mouth at bedtime.     [provider]  levothyroxine (SYNTHROID, LEVOTHROID) 50 MCG tablet Take 50 mcg by mouth daily before breakfast.    [provider]   Multiple Vitamins-Minerals (THEREMS M PO) Take 1 tablet by mouth daily.      [provider]  nitroGLYCERIN (NITROSTAT) 0.4 MG SL tablet Place 1 tablet (0.4 mg total) under the tongue every 5 (five) minutes as needed. Up to 3 doses, if no relief after 3rd dose, proceed to the ED for an evaluation. 01/25/18   Satira Sark, MD  potassium chloride SA (KLOR-CON M20) 20 MEQ tablet TAKE 20 MEQ BY MOUTH DAILY 12/15/18   Satira Sark, MD  pravastatin (PRAVACHOL) 40 MG tablet TAKE 1 TABLET BY MOUTH EVERY EVENING 06/22/18   Satira Sark, MD  Probiotic Product (PROBIOTIC DAILY PO) Take 1 tablet by mouth daily.    [provider]  psyllium (METAMUCIL) 58.6 % packet Take 1 packet by mouth daily.    [provider]  warfarin (COUMADIN) 2 MG tablet  Take 2-3 mg by mouth See admin instructions. Take 3 mg by mouth daily on Monday, Wednesday, Friday. Take 2 mg by mouth daily on all other days.    [provider]    Family History Family History  Problem Relation Age of Onset  . Coronary artery disease Father   . Other Father        Heart attack or stroke  . Cancer Father   . Colon cancer Mother   . Diabetes Neg Hx   . Hypertension Neg Hx   . Esophageal cancer Neg Hx   . Rectal cancer Neg Hx   . Stomach cancer Neg Hx     Social History Social History   Tobacco Use  . Smoking status: Former Smoker    Packs/day: 0.30    Years: 8.00    Pack years: 2.40    Types: Cigarettes    Start date: 03/25/1944    Quit date: 07/27/1954    Years since quitting: 64.7  . Smokeless tobacco: Never Used  Substance Use Topics  . Alcohol use: Yes    Alcohol/week: 0.0 standard drinks    Comment: glass of wine occ  . Drug use: No     Allergies   Patient has no known allergies.   Review of Systems Review of Systems   Physical Exam Updated Vital Signs BP (!) 142/78   Pulse (!) 59   Temp 98.6 F (37 C) (Oral)   Resp 17   SpO2 92%   Physical Exam Vitals  signs and nursing note reviewed. Exam conducted with a chaperone present.  Constitutional:      Appearance: Normal appearance.  HENT:     Head: Normocephalic and atraumatic.  Eyes:     General: No scleral icterus.    Conjunctiva/sclera: Conjunctivae normal.  Neck:     Musculoskeletal: Normal range of motion. No neck rigidity.  Cardiovascular:     Rate and Rhythm: Normal rate.     Pulses: Normal pulses.     Comments: Significant murmur best appreciated right upper sternal border.  Pulmonary:     Breath sounds: Normal breath sounds.     Comments: Mildly tachypneic. Abdominal:     General: Abdomen is flat. There is no distension.     Palpations: Abdomen is soft.     Tenderness: There is no abdominal tenderness. There is no guarding.  Musculoskeletal:     Right lower leg: Edema present.     Left lower leg: Edema present.     Comments: Significant lower leg and pedal pitting edema bilaterally.  Mildly weeping.  Skin:    General: Skin is dry.  Neurological:     Mental Status: He is alert.     GCS: GCS eye subscore is 4. GCS verbal subscore is 5. GCS motor subscore is 6.  Psychiatric:        Mood and Affect: Mood normal.        Behavior: Behavior normal.        Thought Content: Thought content normal.      ED Treatments / Results  Labs (all labs ordered are listed, but only abnormal results are displayed) Labs Reviewed  BASIC METABOLIC PANEL - Abnormal; Notable for the following components:      Result Value   Glucose, Bld 109 (*)    BUN 24 (*)    Creatinine, Ser 1.31 (*)    GFR calc non Af Amer 48 (*)    GFR calc Af Amer 56 (*)  All other components within normal limits  CBC - Abnormal; Notable for the following components:   WBC 3.8 (*)    RBC 3.72 (*)    Hemoglobin 10.5 (*)    HCT 33.0 (*)    RDW 18.7 (*)    Platelets 88 (*)    All other components within normal limits  SARS CORONAVIRUS 2 (TAT 6-24 HRS)  BRAIN NATRIURETIC PEPTIDE  PROTIME-INR    EKG EKG  Interpretation  Date/Time:  Friday April 21 2019 16:06:00 EDT Ventricular Rate:  60 PR Interval:    QRS Duration: 212 QT Interval:  528 QTC Calculation: 528 R Axis:   -129 Text Interpretation:  AV dual-paced rhythm Abnormal ECG since last tracing no significant change Confirmed by Noemi Chapel [phone]) on 04/21/2019 5:10:23 PM   Radiology Dg Chest 2 View  Result Date: 04/21/2019 CLINICAL DATA:  increased leg swelling, changes made to his diuretics but wife reports legs started to weep last night. Pt also having some SOB, no dyspnea noted. EXAM: CHEST - 2 VIEW COMPARISON:  Chest radiograph 12 03/15/2017, 08/02/2015 FINDINGS: Stable cardiomediastinal contours with enlarged heart size status post median sternotomy and CABG. Aortic arch calcification. Central venous congestion. Stable left chest ICD. Diffusely increased interstitial markings likely representing mild pulmonary edema. No pneumothorax. Trace right pleural fluid. No acute finding in the visualized skeleton. IMPRESSION: Central venous congestion and increased bilateral diffuse interstitial markings likely mild pulmonary edema. Electronically Signed   By: Audie Pinto M.D.   On: 04/21/2019 16:28    Procedures Procedures (including critical care time)  Medications Ordered in ED Medications  sodium chloride flush (NS) 0.9 % injection 3 mL (has no administration in time range)  furosemide (LASIX) injection 40 mg (has no administration in time range)     Initial Impression / Assessment and Plan / ED Course  I have reviewed the triage vital signs and the nursing notes.  Pertinent labs & imaging results that were available during my care of the patient were reviewed by me and considered in my medical decision making (see chart for details).        Patient with extensive cardiac history has significant, pitting edema bilaterally as well as a mild pleural effusion on chest x-ray despite taking 160 mg Lasix this morning.   His cardiologist Dr. Rozann Lesches, MD recommended that he present to the ED for IV diuresis and admission.  He is mildly tachypneic on exam and his CXR demonstrates a mild pulmonary edema.  Given his slightly elevated creatinine 1.31 and decreased GFR as well as his worsening shortness of breath and leg swelling despite large quantities p.o. Lasix, I agree with his cardiologist's recommendation that he be a candidate for hospital admission and IV diuresis.    6:45 PM Consulted with the admitting physician and they will evaluate and subsequently admit the patient. Inpatient bed assigned.    Final Clinical Impressions(s) / ED Diagnoses   Final diagnoses:  Bilateral lower extremity edema    ED Discharge Orders    None       Reita Chard 04/21/19 2212    Noemi Chapel, MD 04/22/19 [phone]

## 2019-04-21 NOTE — Telephone Encounter (Signed)
Patient's wife called about his legs.  Stated that they are seeping fluid badly and asking to be seen today.  She feels this can not wait until his appointment.

## 2019-04-21 NOTE — H&P (Signed)
History and Physical    [name] [id number] DOB: February 02, 1929 DOA: 04/21/2019  PCP: Eber Hong, MD  Patient coming from: Home and lives with wife  I have personally briefly reviewed patient's old medical records in [place]  Chief Complaint: Increasing edema and shortness of breath  HPI: [name] is a 85 y.o. 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  Wife provides most of the history at bedside.  She reports that for about 2 weeks patient has had continuous worsening lower extremity edema despite increasing his Lasix at home up to 80 mg a day.  She also noticed progressive weeping edema of his lower extremity.  They decided to finally come in at the recommendation of their cardiologist since he was beginning to have shortness of breath when ambulating short distances inside his home.  He denies any chest pain.  Denies any new fever or chills.  Denies any cough or runny nose.  No nausea, vomiting or abdominal pain.  Patient denies tobacco or illicit drug use.  Patient has a small glass of wine occasionally.  ED Course: He is afebrile and was normotensive on room air.  CBC showed leukopenia of 3.8 and hemoglobin of 10.5 which is around his baseline.  Platelets visible over 88 from a prior normal of 127 about 3 months ago.  BMP showed elevated creatinine of 1.31 from prior about 1.  INR of 2.3.  Chest x-ray shows central venous congestion and increased bilateral diffuse interstitial markings likely representing mild pulmonary edema.  EKG shows dual paced rhythm any ST changes.  Review of Systems:  Constitutional: No Weight  Change, No Fever ENT/Mouth: No sore throat, No Rhinorrhea Eyes: No Eye Pain, No Vision Changes Cardiovascular: No Chest Pain, + SOB, No PND, + Dyspnea on Exertion, No Orthopnea, , + Edema, No Palpitations Respiratory: No Cough, No Sputum, No Wheezing, +Dyspnea  Gastrointestinal: No Nausea, No Vomiting, No Diarrhea, No Constipation, No Pain Genitourinary: no Urinary Incontinence, No Urgency, No Flank Pain Musculoskeletal: No Arthralgias, No Myalgias Skin: No Skin Lesions, No Pruritus, Neuro: no Weakness, No Numbness,  No Loss of Consciousness, No Syncope Psych: No Anxiety/Panic, No Depression, NO decrease appetite Heme/Lymph: No Bruising, No Bleeding  Past Medical History:  Diagnosis Date   Anemia    Arthritis    Atrial flutter (HCC)    Amiodarone therapy, coumadin therapy, prior history of cardioversion and overdrive pacing   Barrett's esophagus    Benign prostatic hyperplasia    Cardiomyopathy    Mixed ischemic and nonischemic with previous EF of 35-40%, most recently in June 2009 normalized to 55-60%. Echo 2/11 EF 55-60%   Carotid artery disease (HCC)    Less than 50% bilateral internal carotid artery stenosis by Doppler 2009   Carpal tunnel syndrome    Chronic diastolic (congestive) heart failure (HCC)    Coronary atherosclerosis of native coronary artery    Status post coronary bypass grafting   Endocarditis    s/p PPM extraction by Dr Lovena Le 2012 endocarditis with enterococcus faecalis   Endocarditis    Essential hypertension, benign    Ganglion cyst    right wrist   GERD (gastroesophageal reflux disease)    PMH  Hiatal hernia    HOH (hard of hearing)    Hyperlipidemia    Hypothyroidism    Inguinal hernia    Left   Internal hemorrhoids    Iron deficiency anemia    Obstructive sleep apnea    Osteoporosis    Paroxysmal atrial fibrillation (HCC)    Amiodarone therapy, status post Cox-Maze procedure but with recurrent atrial fibrillation  while on amiodarone August 2012   Presence of permanent cardiac pacemaker    Symptomatic bradycardia    Tubular adenoma of colon    Valvular heart disease    Status post 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   Warfarin anticoagulation    Wears glasses    Wears partial dentures     Past Surgical History:  Procedure Laterality Date   AORTIC VALVE REPLACEMENT     CARPAL TUNNEL RELEASE  2009   CATARACT EXTRACTION, BILATERAL  07/2011   COLONOSCOPY W/ BIOPSIES AND POLYPECTOMY     CORONARY ARTERY BYPASS GRAFT  2008   x2   HERNIA REPAIR  age 66   INGUINAL HERNIA REPAIR Left 08/30/2017   Procedure: LEFT INGUINAL HERNIA REPAIR;  Surgeon: Rolm Bookbinder, MD;  Location: [facility];  Service: General;  Laterality: Left;  GENERAL ETT AND TAP BLOCK   INSERTION OF MESH Left 08/30/2017   Procedure: INSERTION OF MESH;  Surgeon: Rolm Bookbinder, MD;  Location: [facility];  Service: General;  Laterality: Left;  GENERAL ETT AND TAP BLOCK   KNEE ARTHROSCOPY     MASS EXCISION Right 01/26/2018   Procedure: Right wrist dorsal mass excision;  Surgeon: Iran Planas, MD;  Location: [facility];  Service: Orthopedics;  Laterality: Right;  60 mins   PACEMAKER INSERTION  2009   [facility]ially implanted [facility], extracted by Dr Lovena Le for endocarditis and reimplanted 8/12   PATELLA FRACTURE SURGERY  age 65   screw placed and removed   TEE WITHOUT CARDIOVERSION N/A 07/05/2017   Procedure: TRANSESOPHAGEAL ECHOCARDIOGRAM (TEE);  Surgeon: Sanda Klein, MD;  Location: [facility];  Service: Cardiovascular;  Laterality: N/A;   [place]       reports that he quit smoking about 64 years ago. His smoking use included cigarettes. He started smoking about 75 years ago. He has a 2.40 pack-year smoking history. He has never used smokeless tobacco. He reports current alcohol use. He reports that he does not use drugs.  No Known Allergies  Family History  Problem  Relation Age of Onset   Coronary artery disease Father    Other Father        Heart attack or stroke   Cancer Father    Colon cancer Mother    Diabetes Neg Hx    Hypertension Neg Hx    Esophageal cancer Neg Hx    Rectal cancer Neg Hx    Stomach cancer Neg Hx    Family history reviewed and not pertinent   Prior to Admission medications   Medication Sig Start Date End Date Taking? Authorizing Provider  amiodarone (PACERONE) 200 MG tablet TAKE 0.5 (1/2) TABLETS (100 MG TOTAL) BY MOUTH DAILY. Patient taking differently: Take 100 mg by mouth every morning.  10/03/18  Yes Allred, Jeneen Rinks, MD  amLODipine (NORVASC) 5 MG tablet Take 5 mg by mouth every morning.  01/18/17  Yes [provider]  amoxicillin (AMOXIL) 500 MG tablet Take 2,000 mg by mouth See admin instructions. Take four capsules (2000 mg) by mouth one hour prior to  dental appointments   Yes [provider]  PRESCRIPTION MEDICATION Inhale into the lungs at bedtime. CPAP   Yes [provider]  benazepril (LOTENSIN) 40 MG tablet TAKE 1 TABLET BY MOUTH EVERY DAY 07/01/18   Satira Sark, MD  carvedilol (COREG) 3.125 MG tablet TAKE 1 TABLET (3.125 MG TOTAL) BY MOUTH 2 (TWO) TIMES DAILY. 06/22/18   Satira Sark, MD  docusate sodium (COLACE) 100 MG capsule Take 100 mg by mouth at bedtime.     [provider]  levothyroxine (SYNTHROID, LEVOTHROID) 50 MCG tablet Take 50 mcg by mouth daily before breakfast.    [provider]  Multiple Vitamins-Minerals (THEREMS M PO) Take 1 tablet by mouth daily.      [provider]  nitroGLYCERIN (NITROSTAT) 0.4 MG SL tablet Place 1 tablet (0.4 mg total) under the tongue every 5 (five) minutes as needed. Up to 3 doses, if no relief after 3rd dose, proceed to the ED for an evaluation. 01/25/18   Satira Sark, MD  potassium chloride SA (KLOR-CON M20) 20 MEQ tablet TAKE 20 MEQ BY MOUTH DAILY 12/15/18   Satira Sark, MD  pravastatin  (PRAVACHOL) 40 MG tablet TAKE 1 TABLET BY MOUTH EVERY EVENING 06/22/18   Satira Sark, MD  Probiotic Product (PROBIOTIC DAILY PO) Take 1 tablet by mouth daily.    [provider]  psyllium (METAMUCIL) 58.6 % packet Take 1 packet by mouth daily.    [provider]  warfarin (COUMADIN) 2 MG tablet Take 2-3 mg by mouth See admin instructions. Take 3 mg by mouth daily on Monday, Wednesday, Friday. Take 2 mg by mouth daily on all other days.    [provider]    Physical Exam: Vitals:   04/21/19 1534 04/21/19 1700 04/21/19 1730 04/21/19 1916  BP: 122/67 136/80 (!) 142/78 (!) 149/87  Pulse: (!) 59 (!) 59 (!) 59 61  Resp: 18 15 17 18   Temp: 98.6 F (37 C)     TempSrc: Oral     SpO2: 96% 92% 92% 91%    Constitutional: NAD, calm, comfortable, thin elderly gentleman Vitals:   04/21/19 1534 04/21/19 1700 04/21/19 1730 04/21/19 1916  BP: 122/67 136/80 (!) 142/78 (!) 149/87  Pulse: (!) 59 (!) 59 (!) 59 61  Resp: 18 15 17 18   Temp: 98.6 F (37 C)     TempSrc: Oral     SpO2: 96% 92% 92% 91%   Eyes: PERRL, lids and conjunctivae normal ENMT: Mucous membranes are moist. Posterior pharynx clear of any exudate or lesions.Normal dentition.  Neck: normal, supple, no masses Respiratory: clear to auscultation bilaterally, no wheezing, no crackles. Normal respiratory effort. No accessory muscle use.  Cardiovascular: Regular rate and rhythm, no murmurs / rubs / gallops.  3+ pretibial edema of the lower extremity.  Difficult dorsalis pedis pulse exam due to significant edema.   Abdomen: no tenderness, no masses palpated. Bowel sounds positive.  Musculoskeletal: no clubbing / cyanosis. No joint deformity upper and lower extremities. Good ROM, no contractures. Normal muscle tone.  Skin: no rashes, lesions, ulcers. No induration Neurologic: CN 2-12 grossly intact. Sensation intact, DTR normal. Strength 5/5 in all 4.  Psychiatric: Normal judgment and insight. Alert and  oriented x 3. Normal mood.    Labs on Admission: I have personally reviewed following labs and imaging studies  CBC: Recent Labs  Lab 04/21/19 1611  WBC 3.8*  HGB 10.5*  HCT 33.0*  MCV 88.7  PLT 88*  Basic Metabolic Panel: Recent Labs  Lab 04/21/19 1611  NA 137  K 3.8  CL 103  CO2 26  GLUCOSE 109*  BUN 24*  CREATININE 1.31*  CALCIUM 9.0   GFR: CrCl cannot be calculated (Unknown ideal weight.). Liver Function Tests: No results for input(s): AST, ALT, ALKPHOS, BILITOT, PROT, ALBUMIN in the last 168 hours. No results for input(s): LIPASE, AMYLASE in the last 168 hours. No results for input(s): AMMONIA in the last 168 hours. Coagulation Profile: No results for input(s): INR, PROTIME in the last 168 hours. Cardiac Enzymes: No results for input(s): CKTOTAL, CKMB, CKMBINDEX, TROPONINI in the last 168 hours. BNP (last 3 results) No results for input(s): PROBNP in the last 8760 hours. HbA1C: No results for input(s): HGBA1C in the last 72 hours. CBG: No results for input(s): GLUCAP in the last 168 hours. Lipid Profile: No results for input(s): CHOL, HDL, LDLCALC, TRIG, CHOLHDL, LDLDIRECT in the last 72 hours. Thyroid Function Tests: No results for input(s): TSH, T4TOTAL, FREET4, T3FREE, THYROIDAB in the last 72 hours. Anemia Panel: No results for input(s): VITAMINB12, FOLATE, FERRITIN, TIBC, IRON, RETICCTPCT in the last 72 hours. Urine analysis: No results found for: COLORURINE, APPEARANCEUR, LABSPEC, PHURINE, GLUCOSEU, HGBUR, BILIRUBINUR, KETONESUR, PROTEINUR, UROBILINOGEN, NITRITE, LEUKOCYTESUR  Radiological Exams on Admission: Dg Chest 2 View  Result Date: 04/21/2019 CLINICAL DATA:  increased leg swelling, changes made to his diuretics but wife reports legs started to weep last night. Pt also having some SOB, no dyspnea noted. EXAM: CHEST - 2 VIEW COMPARISON:  Chest radiograph 12 03/15/2017, 08/02/2015 FINDINGS: Stable cardiomediastinal contours with enlarged heart  size status post median sternotomy and CABG. Aortic arch calcification. Central venous congestion. Stable left chest ICD. Diffusely increased interstitial markings likely representing mild pulmonary edema. No pneumothorax. Trace right pleural fluid. No acute finding in the visualized skeleton. IMPRESSION: Central venous congestion and increased bilateral diffuse interstitial markings likely mild pulmonary edema. Electronically Signed   By: Audie Pinto M.D.   On: 04/21/2019 16:28    EKG: Independently reviewed.   Assessment/Plan  Congestive heart failure exacerbation -Has received IV Lasix 40 mg in the ED -Per wife, he is actually been taking up to 80 mg oral daily.  Will start IV 60 mg here. -We will repeat echocardiogram.  Last echocardiogram in December 2018 showed EF of 55 to 60% -Continue Coreg, potassium   Thrombocytopenia -Patient presented with platelets of 88 on admission.  Normally 127. - No bleeding or bruising. Hgb is stable.  - will follow repeat CBC. Check folic and vitamin [id number].   AKI -Creatinine of 1.31 with normal baseline of 1. - monitor with daily BMP. Would expect to elevate with diuresis before improving  OSA on CPAP -Continue CPAP  History of atrial fibrillation/atrial flutter - stable -Continue amiodarone - Coumadin per pharmacy   Hyperlipidemia -Continue pravastatin  Hypothyroidism -Continue Synthroid    DVT prophylaxis:.On the Coumadin Code Status:DNR Family Communication: Plan discussed with patient and wife at bedside  disposition Plan: Home with at least 2 midnight stays  Consults called:  Admission status: inpatient   Aemilia Dedrick T Riley Montecalvo DO [facility]   If 7PM-7AM, please contact night-coverage [email or web address] Password [facility]  04/21/2019, 7:25 PM

## 2019-04-21 NOTE — Telephone Encounter (Signed)
Taking 80 mg daily of lasix the last 2 weeks

## 2019-04-21 NOTE — Telephone Encounter (Signed)
Pt wife called back - she was mistaken on lasix dose - for the last 2 weeks has been taking lasix 40 mg 3 tablets in the morning and 1/2 tablet in the afternoon

## 2019-04-22 ENCOUNTER — Inpatient Hospital Stay (HOSPITAL_COMMUNITY): Payer: Medicare Other

## 2019-04-22 DIAGNOSIS — Z9989 Dependence on other enabling machines and devices: Secondary | ICD-10-CM

## 2019-04-22 DIAGNOSIS — G4733 Obstructive sleep apnea (adult) (pediatric): Secondary | ICD-10-CM

## 2019-04-22 DIAGNOSIS — I340 Nonrheumatic mitral (valve) insufficiency: Secondary | ICD-10-CM

## 2019-04-22 DIAGNOSIS — I361 Nonrheumatic tricuspid (valve) insufficiency: Secondary | ICD-10-CM

## 2019-04-22 DIAGNOSIS — I509 Heart failure, unspecified: Secondary | ICD-10-CM

## 2019-04-22 LAB — BASIC METABOLIC PANEL
Anion gap: 9 (ref 5–15)
BUN: 21 mg/dL (ref 8–23)
CO2: 27 mmol/L (ref 22–32)
Calcium: 8.9 mg/dL (ref 8.9–10.3)
Chloride: 106 mmol/L (ref 98–111)
Creatinine, Ser: 1.17 mg/dL (ref 0.61–1.24)
GFR calc Af Amer: >60 mL/min (ref >60)
GFR calc non Af Amer: 55 mL/min — ABNORMAL LOW (ref >60)
Glucose, Bld: 103 mg/dL — ABNORMAL HIGH (ref 70–99)
Potassium: 3.2 mmol/L — ABNORMAL LOW (ref 3.5–5.1)
Sodium: 142 mmol/L (ref 135–145)

## 2019-04-22 LAB — CBC
HCT: 32.5 % — ABNORMAL LOW (ref 39.0–52.0)
Hemoglobin: 10.6 g/dL — ABNORMAL LOW (ref 13.0–17.0)
MCH: 28.3 pg (ref 26.0–34.0)
MCHC: 32.6 g/dL (ref 30.0–36.0)
MCV: 86.7 fL (ref 80.0–100.0)
Platelets: 93 10*3/uL — ABNORMAL LOW (ref 150–400)
RBC: 3.75 MIL/uL — ABNORMAL LOW (ref 4.22–5.81)
RDW: 18.6 % — ABNORMAL HIGH (ref 11.5–15.5)
WBC: 4.4 10*3/uL (ref 4.0–10.5)
nRBC: 0 % (ref 0.0–0.2)

## 2019-04-22 LAB — SARS CORONAVIRUS 2 (TAT 6-24 HRS): SARS Coronavirus 2: NEGATIVE

## 2019-04-22 LAB — ECHOCARDIOGRAM COMPLETE
Height: 66 in
Weight: 2511.48 oz

## 2019-04-22 LAB — VITAMIN B12: Vitamin B-12: 1056 pg/mL — ABNORMAL HIGH (ref 180–914)

## 2019-04-22 LAB — PROTIME-INR
INR: 2.1 — ABNORMAL HIGH (ref 0.8–1.2)
Prothrombin Time: 23.4 seconds — ABNORMAL HIGH (ref 11.4–15.2)

## 2019-04-22 MED ORDER — LEVOTHYROXINE SODIUM 50 MCG PO TABS
50.0000 ug | ORAL_TABLET | Freq: Every day | ORAL | Status: DC
  Administered 2019-04-22 – 2019-04-26 (×5): 50 ug via ORAL
  Filled 2019-04-22 (×5): qty 1

## 2019-04-22 MED ORDER — PRAVASTATIN SODIUM 40 MG PO TABS
40.0000 mg | ORAL_TABLET | Freq: Every day | ORAL | Status: DC
  Administered 2019-04-22 – 2019-04-25 (×4): 40 mg via ORAL
  Filled 2019-04-22 (×4): qty 1

## 2019-04-22 MED ORDER — AMIODARONE HCL 100 MG PO TABS
100.0000 mg | ORAL_TABLET | Freq: Every morning | ORAL | Status: DC
  Administered 2019-04-22 – 2019-04-26 (×5): 100 mg via ORAL
  Filled 2019-04-22 (×5): qty 1

## 2019-04-22 MED ORDER — AMLODIPINE BESYLATE 5 MG PO TABS
5.0000 mg | ORAL_TABLET | Freq: Every morning | ORAL | Status: DC
  Administered 2019-04-22 – 2019-04-26 (×5): 5 mg via ORAL
  Filled 2019-04-22 (×5): qty 1

## 2019-04-22 MED ORDER — FUROSEMIDE 10 MG/ML IJ SOLN
60.0000 mg | Freq: Every day | INTRAMUSCULAR | Status: DC
  Administered 2019-04-22 – 2019-04-24 (×3): 60 mg via INTRAVENOUS
  Filled 2019-04-22 (×3): qty 6

## 2019-04-22 MED ORDER — WARFARIN SODIUM 2 MG PO TABS
2.0000 mg | ORAL_TABLET | Freq: Once | ORAL | Status: AC
  Administered 2019-04-22: 2 mg via ORAL
  Filled 2019-04-22: qty 1

## 2019-04-22 MED ORDER — BENAZEPRIL HCL 40 MG PO TABS
40.0000 mg | ORAL_TABLET | Freq: Every day | ORAL | Status: DC
  Administered 2019-04-22 – 2019-04-26 (×5): 40 mg via ORAL
  Filled 2019-04-22 (×5): qty 1

## 2019-04-22 MED ORDER — MAGNESIUM SULFATE 2 GM/50ML IV SOLN
2.0000 g | Freq: Once | INTRAVENOUS | Status: AC
  Administered 2019-04-22: 2 g via INTRAVENOUS
  Filled 2019-04-22: qty 50

## 2019-04-22 MED ORDER — CHLORHEXIDINE GLUCONATE CLOTH 2 % EX PADS: 6.0000 | MEDICATED_PAD | Freq: Every day | CUTANEOUS | Status: DC

## 2019-04-22 MED ORDER — POTASSIUM CHLORIDE CRYS ER 20 MEQ PO TBCR
40.0000 meq | EXTENDED_RELEASE_TABLET | Freq: Every morning | ORAL | Status: DC
  Administered 2019-04-23 – 2019-04-26 (×4): 40 meq via ORAL
  Filled 2019-04-22 (×4): qty 2

## 2019-04-22 MED ORDER — POTASSIUM CHLORIDE CRYS ER 20 MEQ PO TBCR
40.0000 meq | EXTENDED_RELEASE_TABLET | Freq: Once | ORAL | Status: AC
  Administered 2019-04-22: 40 meq via ORAL
  Filled 2019-04-22: qty 2

## 2019-04-22 MED ORDER — CARVEDILOL 3.125 MG PO TABS
3.1250 mg | ORAL_TABLET | Freq: Two times a day (BID) | ORAL | Status: DC
  Administered 2019-04-22 – 2019-04-26 (×9): 3.125 mg via ORAL
  Filled 2019-04-22 (×9): qty 1

## 2019-04-22 MED ORDER — POTASSIUM CHLORIDE CRYS ER 20 MEQ PO TBCR
20.0000 meq | EXTENDED_RELEASE_TABLET | Freq: Every morning | ORAL | Status: DC
  Administered 2019-04-22: 20 meq via ORAL
  Filled 2019-04-22: qty 1

## 2019-04-22 NOTE — Progress Notes (Signed)
  Echocardiogram 2D Echocardiogram has been performed.  [name] 04/22/2019, 11:06 AM

## 2019-04-22 NOTE — Progress Notes (Signed)
ANTICOAGULATION CONSULT NOTE - Follow-up Consult  Pharmacy Consult for Warfarin Indication: atrial fibrillation  No Known Allergies  Patient Measurements: Height: 5\' 6"  (167.6 cm) Weight: 156 lb 15.5 oz (71.2 kg) IBW/kg (Calculated) : 63.8   Vital Signs: Temp: 98 F (36.7 C) (09/26 0450) Temp Source: Oral (09/26 0450) BP: 136/69 (09/26 0450) Pulse Rate: 60 (09/26 0450)  Labs: Recent Labs    04/21/19 1611 04/21/19 1634 04/22/19 0102  HGB 10.5*  --  10.6*  HCT 33.0*  --  32.5*  PLT 88*  --  93*  LABPROT  --  24.7* 23.4*  INR  --  2.3* 2.1*  CREATININE 1.31*  --  1.17    Estimated Creatinine Clearance: 39.4 mL/min (by C-G formula based on SCr of 1.17 mg/dL).   Medical History: Past Medical History:  Diagnosis Date  . Anemia   . Arthritis   . Atrial flutter (HCC)    Amiodarone therapy, coumadin therapy, prior history of cardioversion and overdrive pacing  . Barrett's esophagus   . Benign prostatic hyperplasia   . Cardiomyopathy    Mixed ischemic and nonischemic with previous EF of 35-40%, most recently in June 2009 normalized to 55-60%. Echo 2/11 EF 55-60%  . Carotid artery disease (HCC)    Less than 50% bilateral internal carotid artery stenosis by Doppler 2009  . Carpal tunnel syndrome   . Chronic diastolic (congestive) heart failure ([place])   . Coronary atherosclerosis of native coronary artery    Status post coronary bypass grafting  . 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 Endocarditis   . Essential hypertension, benign   . Ganglion cyst    right wrist  . GERD (gastroesophageal reflux disease)    PMH  . Hiatal hernia   . HOH (hard of hearing)   . Hyperlipidemia   . Hypothyroidism   . Inguinal hernia    Left  . Internal hemorrhoids   . Iron deficiency anemia   . Obstructive sleep apnea   . Osteoporosis   . Paroxysmal atrial fibrillation (HCC)    Amiodarone therapy, status post Cox-Maze  procedure but with recurrent atrial fibrillation while on amiodarone August 2012  . Presence of permanent cardiac pacemaker   . Symptomatic bradycardia   . Tubular adenoma of colon   . 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 Warfarin anticoagulation   . Wears glasses   . Wears partial dentures     Medications:  Scheduled:  . amiodarone  100 mg Oral q morning - 10a  . amLODipine  5 mg Oral q morning - 10a  . benazepril  40 mg Oral Daily  . carvedilol  3.125 mg Oral BID WC  . furosemide  60 mg Intravenous Daily  . levothyroxine  50 mcg Oral QAC breakfast  . potassium chloride SA  20 mEq Oral q morning - 10a  . pravastatin  40 mg Oral Q supper  . sodium chloride flush  3 mL Intravenous Once  . Warfarin - Pharmacist Dosing Inpatient   Does not apply q1800    Assessment: 85 y.o. presenting with leg swelling. PMH significant for CHF, CAD, HTN, OSA, and Atrial Flutter on Warfarin PTA - 3 mg on Sundays and 2 mg all other days (Last dose 9/25 @10AM ). Admission INR therapeutic at 2.3. Pharmacy consulted for warfarin dosing.  INR today is 2.1. CBC and Plts low but stable  near baseline for the patient. No bleeding noted per nursing. Hgb 10.6, Plts 93, Scr 1.17.  Goal of Therapy:  INR 2-3 Monitor platelets by anticoagulation protocol: Yes   Plan:  Warfarin 2mg  tonight (usual home dose). Dose was not given last night d/t time of last dose.  Monitor daily INR Monitor s/sx of bleeding and platelets   Kennon Holter, PharmD PGY1 Ambulatory Care Pharmacy Resident [employer] Phone: [phone]

## 2019-04-22 NOTE — Progress Notes (Addendum)
PROGRESS NOTE  [name] [id number] DOB: 10/23/1928 DOA: 04/21/2019 PCP: Eber Hong, MD  HPI/Recap of past 24 hours: [name] is a 85 y.o. 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  Wife provides most of the history at bedside.  She reports that for about 2 weeks patient has had continuous worsening lower extremity edema despite increasing his Lasix at home up to 80 mg a day.  She also noticed progressive weeping edema of his lower extremity.  They decided to finally come in at the recommendation of their cardiologist since he was beginning to have shortness of breath when ambulating short distances inside his home.  He denies any chest pain.  Denies any new fever or chills.  Denies any cough or runny nose.  No nausea, vomiting or abdominal pain.  Patient denies tobacco or illicit drug use.  Patient has a small glass of wine occasionally.  ED Course: He is afebrile and was normotensive on room air.  CBC showed leukopenia of 3.8 and hemoglobin of 10.5 which is around his baseline.  Platelets visible over 88 from a prior normal of 127 about 3 months ago.  BMP showed elevated creatinine of 1.31 from prior about 1.  INR of 2.3.  Chest x-ray shows central venous congestion and increased bilateral diffuse interstitial markings likely representing mild pulmonary edema.  EKG shows dual paced rhythm any ST changes.  04/22/19: Patient was seen and examined at bedside this morning.  Reports he is breathing better and his leg edema is improved.  Denies any chest pain or palpitations.   Assessment/Plan: Active Problems:   OSA on CPAP  Congestive heart failure (CHF) (HCC)  Acute on chronic diastolic CHF Presented with elevated BNP greater than thousand Independently viewed chest x-ray done on admission which showed cardiomegaly with increased pulmonary vascularity indicative of pulmonary edema.  Right AICD in place. Last 2D echo on December 2018 revealed LVEF 55 to 60% Repeat 2D echo pending Ongoing diuresing Continue strict I's and O's and daily weight Net I&O -2.9 L Continue to monitor blood pressure, electrolytes and renal function while on diuretics Daily BMPs  Hypokalemia likely secondary to diuretics Potassium 3.2 Repleted with KCl p.o. 40 mg once Continue KCl p.o. 40 mg daily Add magnesium IV 2 g once Daily BMP Obtain magnesium level in the morning  Paroxysmal A. fib On Coumadin INR is therapeutic at 2.1 on 04/22/2019 Pharmacy managing Coumadin Rate controlled on Coreg Rhythm controlled on amiodarone  QTC prolongation Independently reviewed twelve-lead EKG done on admission which shows QTC 526. Avoid QTC prolonging agents AICD in place  Hypothyroidism Continue levothyroxine  Resolved AKI Baseline creatinine appears to be 1.0 with GFR greater than 60 Presented with creatinine of 1.3 with GFR of 48 Creatinine improving to 1.1 with GFR 55 Continue to avoid nephrotoxins Continue to monitor urine output Daily BMPs  OSA CPAP at night  Physical debility PT OT to assess.  Fall precautions   Disposition plan: Patient is currently not appropriate for discharge due to persistent acute on chronic diastolic CHF requiring further diuresing in the setting of multiple comorbidities and advanced age.  Patient will require at least 2 midnights for further evaluation and treatment of present  condition.  DVT prophylaxis:.Coumadin Code Status: DNR Family Communication: None at bedside. disposition Plan: Home with at least 2 midnight stays  Consults called: None.    Objective: Vitals:   04/21/19 2224  04/21/19 2304 04/22/19 0450 04/22/19 0614  BP: (!) 153/85  136/69   Pulse: (!) 58 60 60   Resp: 18 16 19    Temp: 98.2 F (36.8 C)  98 F (36.7 C)   TempSrc: Oral  Oral   SpO2: 91% 92% 92%   Weight:    71.2 kg  Height:        Intake/Output Summary (Last 24 hours) at 04/22/2019 0928 Last data filed at 04/22/2019 0453 Gross per 24 hour  Intake -  Output 2900 ml  Net -2900 ml   Filed Weights   04/21/19 2217 04/22/19 0614  Weight: 72.6 kg 71.2 kg    Exam:  . General: 85 y.o. year-old male well developed well nourished in no acute distress.  Alert and oriented x3. . Cardiovascular: Regular rate and rhythm with no rubs or gallops.  No thyromegaly.  Positive JVD bilaterally.   Marland Kitchen Respiratory: Mild diffuse rales bilaterally no wheezing noted. Good inspiratory effort. . Abdomen: Soft nontender nondistended with normal bowel sounds x4 quadrants. . Musculoskeletal: Trace lower extremity edema. 2/4 pulses in all 4 extremities. Marland Kitchen Psychiatry: Mood is appropriate for condition and setting   Data Reviewed: CBC: Recent Labs  Lab 04/21/19 1611 04/22/19 0102  WBC 3.8* 4.4  HGB 10.5* 10.6*  HCT 33.0* 32.5*  MCV 88.7 86.7  PLT 88* 93*   Basic Metabolic Panel: Recent Labs  Lab 04/21/19 1611 04/22/19 0102  NA 137 142  K 3.8 3.2*  CL 103 106  CO2 26 27  GLUCOSE 109* 103*  BUN 24* 21  CREATININE 1.31* 1.17  CALCIUM 9.0 8.9   GFR: Estimated Creatinine Clearance: 39.4 mL/min (by C-G formula based on SCr of 1.17 mg/dL). Liver Function Tests: No results for input(s): AST, ALT, ALKPHOS, BILITOT, PROT, ALBUMIN in the last 168 hours. No results for input(s): LIPASE, AMYLASE in the last 168 hours. No results for input(s): AMMONIA in the last 168 hours. Coagulation Profile: Recent Labs  Lab 04/21/19 1634 04/22/19 0102  INR 2.3* 2.1*   Cardiac Enzymes: No results for input(s): CKTOTAL, CKMB, CKMBINDEX, TROPONINI in the last 168 hours. BNP (last 3 results) No results for  input(s): PROBNP in the last 8760 hours. HbA1C: No results for input(s): HGBA1C in the last 72 hours. CBG: No results for input(s): GLUCAP in the last 168 hours. Lipid Profile: No results for input(s): CHOL, HDL, LDLCALC, TRIG, CHOLHDL, LDLDIRECT in the last 72 hours. Thyroid Function Tests: No results for input(s): TSH, T4TOTAL, FREET4, T3FREE, THYROIDAB in the last 72 hours. Anemia Panel: Recent Labs    04/22/19 0102  VITAMINB12 1,056*   Urine analysis: No results found for: COLORURINE, APPEARANCEUR, LABSPEC, PHURINE, GLUCOSEU, HGBUR, BILIRUBINUR, KETONESUR, PROTEINUR, UROBILINOGEN, NITRITE, LEUKOCYTESUR Sepsis Labs: @LABRCNTIP (procalcitonin:4,lacticidven:4)  ) Recent Results (from the past 240 hour(s))  SARS CORONAVIRUS 2 (TAT 6-24 HRS) Nasopharyngeal Nasopharyngeal Swab     Status: None   Collection Time: 04/21/19  6:45 PM   Specimen: Nasopharyngeal Swab  Result Value Ref Range Status   SARS Coronavirus 2 NEGATIVE NEGATIVE Final    Comment: (NOTE) SARS-CoV-2 target nucleic acids are NOT DETECTED. The SARS-CoV-2 RNA is generally detectable in upper and lower respiratory specimens during the acute phase of infection. Negative results do not preclude SARS-CoV-2 infection, do not rule out co-infections with  other pathogens, and should not be used as the sole basis for treatment or other patient management decisions. Negative results must be combined with clinical observations, patient history, and epidemiological information. The expected result is Negative. Fact Sheet for Patients: SugarRoll.be Fact Sheet for Healthcare Providers: https://www.woods-mathews.com/ This test is not yet approved or cleared by the [place] FDA and  has been authorized for detection and/or diagnosis of SARS-CoV-2 by FDA under an Emergency Use Authorization (EUA). This EUA will remain  in effect (meaning this test can be used) for the duration of the  COVID-19 declaration under Section 56 4(b)(1) of the Act, 21 U.S.C. section 360bbb-3(b)(1), unless the authorization is terminated or revoked sooner. Performed at [facility], [place] [address], [place],  [address]       Studies: Dg Chest 2 View  Result Date: 04/21/2019 CLINICAL DATA:  increased leg swelling, changes made to his diuretics but wife reports legs started to weep last night. Pt also having some SOB, no dyspnea noted. EXAM: CHEST - 2 VIEW COMPARISON:  Chest radiograph 12 03/15/2017, 08/02/2015 FINDINGS: Stable cardiomediastinal contours with enlarged heart size status post median sternotomy and CABG. Aortic arch calcification. Central venous congestion. Stable left chest ICD. Diffusely increased interstitial markings likely representing mild pulmonary edema. No pneumothorax. Trace right pleural fluid. No acute finding in the visualized skeleton. IMPRESSION: Central venous congestion and increased bilateral diffuse interstitial markings likely mild pulmonary edema. Electronically Signed   By: Audie Pinto M.D.   On: 04/21/2019 16:28    Scheduled Meds: . amiodarone  100 mg Oral q morning - 10a  . amLODipine  5 mg Oral q morning - 10a  . benazepril  40 mg Oral Daily  . carvedilol  3.125 mg Oral BID WC  . furosemide  60 mg Intravenous Daily  . levothyroxine  50 mcg Oral QAC breakfast  . potassium chloride SA  20 mEq Oral q morning - 10a  . pravastatin  40 mg Oral Q supper  . warfarin  2 mg Oral ONCE-1800  . Warfarin - Pharmacist Dosing Inpatient   Does not apply q1800    Continuous Infusions:   LOS: 1 day     Kayleen Memos, MD [facility] Pager [phone]  If 7PM-7AM, please contact night-coverage [email or web address] Password [facility] 04/22/2019, 9:28 AM

## 2019-04-22 NOTE — Progress Notes (Signed)
Pt able to be up in chair for 4 hours today, required two assist to pivot to chair, currently back in bed with no complaints.

## 2019-04-23 LAB — BASIC METABOLIC PANEL
Anion gap: 7 (ref 5–15)
BUN: 24 mg/dL — ABNORMAL HIGH (ref 8–23)
CO2: 26 mmol/L (ref 22–32)
Calcium: 8.9 mg/dL (ref 8.9–10.3)
Chloride: 106 mmol/L (ref 98–111)
Creatinine, Ser: 1.2 mg/dL (ref 0.61–1.24)
GFR calc Af Amer: >60 mL/min (ref >60)
GFR calc non Af Amer: 54 mL/min — ABNORMAL LOW (ref >60)
Glucose, Bld: 105 mg/dL — ABNORMAL HIGH (ref 70–99)
Potassium: 3.9 mmol/L (ref 3.5–5.1)
Sodium: 139 mmol/L (ref 135–145)

## 2019-04-23 LAB — MAGNESIUM: Magnesium: 2.5 mg/dL — ABNORMAL HIGH (ref 1.7–2.4)

## 2019-04-23 LAB — PROTIME-INR
INR: 1.9 — ABNORMAL HIGH (ref 0.8–1.2)
Prothrombin Time: 21.4 seconds — ABNORMAL HIGH (ref 11.4–15.2)

## 2019-04-23 MED ORDER — SENNOSIDES-DOCUSATE SODIUM 8.6-50 MG PO TABS
2.0000 | ORAL_TABLET | Freq: Two times a day (BID) | ORAL | Status: DC
  Administered 2019-04-23 – 2019-04-25 (×5): 2 via ORAL
  Filled 2019-04-23 (×5): qty 2

## 2019-04-23 MED ORDER — WARFARIN SODIUM 2 MG PO TABS
3.0000 mg | ORAL_TABLET | Freq: Once | ORAL | Status: AC
  Administered 2019-04-23: 3 mg via ORAL
  Filled 2019-04-23: qty 1

## 2019-04-23 MED ORDER — CHLORHEXIDINE GLUCONATE CLOTH 2 % EX PADS
6.0000 | MEDICATED_PAD | Freq: Every day | CUTANEOUS | Status: DC
  Administered 2019-04-23: 6 via TOPICAL

## 2019-04-23 MED ORDER — POLYETHYLENE GLYCOL 3350 17 G PO PACK
17.0000 g | PACK | Freq: Two times a day (BID) | ORAL | Status: DC
  Administered 2019-04-23 – 2019-04-25 (×2): 17 g via ORAL
  Filled 2019-04-23 (×2): qty 1

## 2019-04-23 NOTE — Progress Notes (Signed)
PROGRESS NOTE  [name] [id number] DOB: May 17, 1929 DOA: 04/21/2019 PCP: Eber Hong, MD  HPI/Recap of past 24 hours: [name] is a 85 y.o. 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  Wife provides most of the history at bedside.  She reports that for about 2 weeks patient has had continuous worsening lower extremity edema despite increasing his Lasix at home up to 80 mg a day.  She also noticed progressive weeping edema of his lower extremity.  They decided to finally come in at the recommendation of their cardiologist since he was beginning to have shortness of breath when ambulating short distances inside his home.  He denies any chest pain.  Denies any new fever or chills.  Denies any cough or runny nose.  No nausea, vomiting or abdominal pain.  Patient denies tobacco or illicit drug use.  Patient has a small glass of wine occasionally.  ED Course: He is afebrile and was normotensive on room air.  CBC showed leukopenia of 3.8 and hemoglobin of 10.5 which is around his baseline.  Platelets visible over 88 from a prior normal of 127 about 3 months ago.  BMP showed elevated creatinine of 1.31 from prior about 1.  INR of 2.3.  Chest x-ray shows central venous congestion and increased bilateral diffuse interstitial markings likely representing mild pulmonary edema.  EKG shows dual paced rhythm any ST changes.  04/23/19: Patient was seen and examined at bedside this morning.  Breathing is improved but not back to baseline.  Denies chest pain.  Ongoing diuresing.  Subtherapeutic INR, 1.9.   Assessment/Plan: Active Problems:   OSA on CPAP   Congestive heart failure (CHF) (HCC)  Acute on chronic diastolic CHF Presented with elevated BNP greater than thousand Independently viewed chest x-ray done on admission which showed cardiomegaly with increased pulmonary vascularity indicative of pulmonary edema.  Right AICD in place. Last 2D echo on December 2018 revealed LVEF 55 to 60% Repeat 2D echo pending Ongoing diuresing Continue strict I's and O's and daily weight Net I&O -3.7 L Continue to monitor blood pressure, electrolytes and renal function while on diuretics Daily BMPs  Resolved hypokalemia post repletion Potassium 3.9 on 04/23/2019 from potassium 3.2  Paroxysmal A. fib On Coumadin INR is subtherapeutic 1.9 on 04/23/2019 Pharmacy managing Coumadin Rate controlled on Coreg Rhythm controlled on amiodarone  QTC prolongation Independently reviewed twelve-lead EKG done on admission which shows QTC 526. Avoid QTC prolonging agents AICD in place  Hypothyroidism Continue levothyroxine  Resolved AKI Baseline creatinine appears to be 1.0 with GFR greater than 60 Presented with creatinine of 1.3 with GFR of 48 Creatinine improving to 1.1 with GFR 55 Continue to avoid nephrotoxins Continue to monitor urine output Daily BMPs  OSA CPAP at night  Physical debility PT OT to assess.  Fall precautions   Disposition plan: Patient is currently not appropriate for discharge due to persistent acute on chronic diastolic CHF requiring further diuresing in the setting of multiple comorbidities and advanced age.  Possible discharge to home tomorrow 04/24/2019.  DVT prophylaxis:.Coumadin Code Status: DNR Family Communication: None at bedside. disposition Plan: Home with at least 2 midnight stays  Consults called: None.    Objective: Vitals:  04/22/19 0614 04/22/19 1309 04/22/19 2012 04/23/19 0507  BP:  (!) 100/54 110/63 127/73  Pulse:  (!) 56 (!) 59 (!) 57  Resp:    15  Temp:  98.3 F (36.8 C) 98.8 F (37.1 C) 98.4  F (36.9 C)  TempSrc:  Oral Oral Oral  SpO2:  (!) 87% (!) 89% 91%  Weight: 71.2 kg   72.4 kg  Height:        Intake/Output Summary (Last 24 hours) at 04/23/2019 1339 Last data filed at 04/23/2019 [id number] Gross per 24 hour  Intake 120 ml  Output 700 ml  Net -580 ml   Filed Weights   04/21/19 2217 04/22/19 0614 04/23/19 0507  Weight: 72.6 kg 71.2 kg 72.4 kg    Exam:  . General: 85 y.o. year-old male well-developed well-nourished no acute distress.  Alert and oriented times 3.   . Cardiovascular: Regular rate and rhythm no rubs or gallops no JVD or thyromegaly . Marland Kitchen Respiratory: Mild diffuse rales bilaterally no wheezing noted.  Poor respiratory effort.   . Abdomen: Soft nontender nondistended normal bowel sounds present.  . Musculoskeletal: Trace lower extremity edema bilaterally.  2/4 pulses in all 4 extremities.   Marland Kitchen Psychiatry: Mood is appropriate for condition and setting.   Data Reviewed: CBC: Recent Labs  Lab 04/21/19 1611 04/22/19 0102  WBC 3.8* 4.4  HGB 10.5* 10.6*  HCT 33.0* 32.5*  MCV 88.7 86.7  PLT 88* 93*   Basic Metabolic Panel: Recent Labs  Lab 04/21/19 1611 04/22/19 0102 04/23/19 0503  NA 137 142 139  K 3.8 3.2* 3.9  CL 103 106 106  CO2 26 27 26   GLUCOSE 109* 103* 105*  BUN 24* 21 24*  CREATININE 1.31* 1.17 1.20  CALCIUM 9.0 8.9 8.9  MG  --   --  2.5*   GFR: Estimated Creatinine Clearance: 38.4 mL/min (by C-G formula based on SCr of 1.2 mg/dL). Liver Function Tests: No results for input(s): AST, ALT, ALKPHOS, BILITOT, PROT, ALBUMIN in the last 168 hours. No results for input(s): LIPASE, AMYLASE in the last 168 hours. No results for input(s): AMMONIA in the last 168 hours. Coagulation Profile: Recent Labs  Lab 04/21/19 1634 04/22/19 0102 04/23/19 0503  INR 2.3* 2.1* 1.9*   Cardiac Enzymes: No results for input(s): CKTOTAL, CKMB, CKMBINDEX, TROPONINI in the last 168 hours. BNP (last 3 results) No results for input(s): PROBNP in the last  8760 hours. HbA1C: No results for input(s): HGBA1C in the last 72 hours. CBG: No results for input(s): GLUCAP in the last 168 hours. Lipid Profile: No results for input(s): CHOL, HDL, LDLCALC, TRIG, CHOLHDL, LDLDIRECT in the last 72 hours. Thyroid Function Tests: No results for input(s): TSH, T4TOTAL, FREET4, T3FREE, THYROIDAB in the last 72 hours. Anemia Panel: Recent Labs    04/22/19 0102  VITAMINB12 1,056*   Urine analysis: No results found for: COLORURINE, APPEARANCEUR, LABSPEC, PHURINE, GLUCOSEU, HGBUR, BILIRUBINUR, KETONESUR, PROTEINUR, UROBILINOGEN, NITRITE, LEUKOCYTESUR Sepsis Labs: @LABRCNTIP (procalcitonin:4,lacticidven:4)  ) Recent Results (from the past 240 hour(s))  SARS CORONAVIRUS 2 (TAT 6-24 HRS) Nasopharyngeal Nasopharyngeal Swab     Status: None   Collection Time: 04/21/19  6:45 PM   Specimen: Nasopharyngeal Swab  Result Value Ref Range Status   SARS Coronavirus 2 NEGATIVE NEGATIVE Final    Comment: (NOTE) SARS-CoV-2 target nucleic acids are NOT DETECTED. The SARS-CoV-2 RNA is generally detectable in upper and lower respiratory specimens during the acute phase of infection. Negative results do not preclude SARS-CoV-2 infection, do not  rule out co-infections with other pathogens, and should not be used as the sole basis for treatment or other patient management decisions. Negative results must be combined with clinical observations, patient history, and epidemiological information. The expected result is Negative. Fact Sheet for Patients: SugarRoll.be Fact Sheet for Healthcare Providers: https://www.woods-mathews.com/ This test is not yet approved or cleared by the [place] FDA and  has been authorized for detection and/or diagnosis of SARS-CoV-2 by FDA under an Emergency Use Authorization (EUA). This EUA will remain  in effect (meaning this test can be used) for the duration of the COVID-19 declaration under  Section 56 4(b)(1) of the Act, 21 U.S.C. section 360bbb-3(b)(1), unless the authorization is terminated or revoked sooner. Performed at [facility], [place] [address], [place], [place] [address]       Studies: No results found.  Scheduled Meds: . amiodarone  100 mg Oral q morning - 10a  . amLODipine  5 mg Oral q morning - 10a  . benazepril  40 mg Oral Daily  . carvedilol  3.125 mg Oral BID WC  . Chlorhexidine Gluconate Cloth  6 each Topical Daily  . furosemide  60 mg Intravenous Daily  . levothyroxine  50 mcg Oral QAC breakfast  . polyethylene glycol  17 g Oral BID  . potassium chloride SA  40 mEq Oral q morning - 10a  . pravastatin  40 mg Oral Q supper  . senna-docusate  2 tablet Oral BID  . warfarin  3 mg Oral ONCE-1800  . Warfarin - Pharmacist Dosing Inpatient   Does not apply q1800    Continuous Infusions:   LOS: 2 days     Kayleen Memos, MD [facility] Pager [phone]  If 7PM-7AM, please contact night-coverage [email or web address] Password [email or web address] 04/23/2019, 1:39 PM

## 2019-04-23 NOTE — Progress Notes (Signed)
Patient refused CPAP for the night  

## 2019-04-23 NOTE — Progress Notes (Signed)
ANTICOAGULATION CONSULT NOTE - Follow-up Consult  Pharmacy Consult for Warfarin Indication: atrial fibrillation  No Known Allergies  Patient Measurements: Height: 5\' 6"  (167.6 cm) Weight: 159 lb 11.2 oz (72.4 kg) IBW/kg (Calculated) : 63.8   Vital Signs: Temp: 98.4 F (36.9 C) (09/27 0507) Temp Source: Oral (09/27 0507) BP: 127/73 (09/27 0507) Pulse Rate: 57 (09/27 0507)  Labs: Recent Labs    04/21/19 1611 04/21/19 1634 04/22/19 0102 04/23/19 0503  HGB 10.5*  --  10.6*  --   HCT 33.0*  --  32.5*  --   PLT 88*  --  93*  --   LABPROT  --  24.7* 23.4* 21.4*  INR  --  2.3* 2.1* 1.9*  CREATININE 1.31*  --  1.17 1.20    Estimated Creatinine Clearance: 38.4 mL/min (by C-G formula based on SCr of 1.2 mg/dL).   Medical History: Past Medical History:  Diagnosis Date  . Anemia   . Arthritis   . Atrial flutter (HCC)    Amiodarone therapy, coumadin therapy, prior history of cardioversion and overdrive pacing  . Barrett's esophagus   . Benign prostatic hyperplasia   . Cardiomyopathy    Mixed ischemic and nonischemic with previous EF of 35-40%, most recently in June 2009 normalized to 55-60%. Echo 2/11 EF 55-60%  . Carotid artery disease (HCC)    Less than 50% bilateral internal carotid artery stenosis by Doppler 2009  . Carpal tunnel syndrome   . Chronic diastolic (congestive) heart failure ([place])   . Coronary atherosclerosis of native coronary artery    Status post coronary bypass grafting  . 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 Endocarditis   . Essential hypertension, benign   . Ganglion cyst    right wrist  . GERD (gastroesophageal reflux disease)    PMH  . Hiatal hernia   . HOH (hard of hearing)   . Hyperlipidemia   . Hypothyroidism   . Inguinal hernia    Left  . Internal hemorrhoids   . Iron deficiency anemia   . Obstructive sleep apnea   . Osteoporosis   . Paroxysmal atrial fibrillation (HCC)     Amiodarone therapy, status post Cox-Maze procedure but with recurrent atrial fibrillation while on amiodarone August 2012  . Presence of permanent cardiac pacemaker   . Symptomatic bradycardia   . Tubular adenoma of colon   . 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 Warfarin anticoagulation   . Wears glasses   . Wears partial dentures     Medications:  Scheduled:  . amiodarone  100 mg Oral q morning - 10a  . amLODipine  5 mg Oral q morning - 10a  . benazepril  40 mg Oral Daily  . carvedilol  3.125 mg Oral BID WC  . Chlorhexidine Gluconate Cloth  6 each Topical Daily  . furosemide  60 mg Intravenous Daily  . levothyroxine  50 mcg Oral QAC breakfast  . potassium chloride SA  40 mEq Oral q morning - 10a  . pravastatin  40 mg Oral Q supper  . Warfarin - Pharmacist Dosing Inpatient   Does not apply q1800    Assessment: 85 y.o. presenting with leg swelling. PMH significant for CHF, CAD, HTN, OSA, bioprosthetic valve and mitral valve repair, and Atrial Flutter on Warfarin PTA - 3 mg on Sundays and 2 mg all other days (Last dose 9/25 @10AM ). Admission  INR therapeutic at 2.3. Pharmacy consulted for warfarin dosing. She is on amiodarone PTA.   INR today is slightly subtherapeutic at 1.9, no H&H today, but has been stable at 10.6/32.5, plts wnl. Given that he is close to goal, was therapeutic on admission, and is relatively stable, will continue home dosing regimen for now.   Goal of Therapy:  INR 2-3 Monitor platelets by anticoagulation protocol: Yes   Plan:  Warfarin 3 mg tonight  Monitor daily INR Monitor s/sx of bleeding and platelets    Thank you,   Eddie Candle, PharmD PGY-1 Pharmacy Resident   Please check amion for clinical pharmacist contact number

## 2019-04-24 DIAGNOSIS — N179 Acute kidney failure, unspecified: Secondary | ICD-10-CM

## 2019-04-24 DIAGNOSIS — I5043 Acute on chronic combined systolic (congestive) and diastolic (congestive) heart failure: Secondary | ICD-10-CM

## 2019-04-24 DIAGNOSIS — I340 Nonrheumatic mitral (valve) insufficiency: Secondary | ICD-10-CM

## 2019-04-24 LAB — BASIC METABOLIC PANEL
Anion gap: 10 (ref 5–15)
BUN: 24 mg/dL — ABNORMAL HIGH (ref 8–23)
CO2: 26 mmol/L (ref 22–32)
Calcium: 8.8 mg/dL — ABNORMAL LOW (ref 8.9–10.3)
Chloride: 102 mmol/L (ref 98–111)
Creatinine, Ser: 1.27 mg/dL — ABNORMAL HIGH (ref 0.61–1.24)
GFR calc Af Amer: 58 mL/min — ABNORMAL LOW (ref >60)
GFR calc non Af Amer: 50 mL/min — ABNORMAL LOW (ref >60)
Glucose, Bld: 181 mg/dL — ABNORMAL HIGH (ref 70–99)
Potassium: 3.6 mmol/L (ref 3.5–5.1)
Sodium: 138 mmol/L (ref 135–145)

## 2019-04-24 LAB — CBC
HCT: 32.3 % — ABNORMAL LOW (ref 39.0–52.0)
Hemoglobin: 10.4 g/dL — ABNORMAL LOW (ref 13.0–17.0)
MCH: 28.2 pg (ref 26.0–34.0)
MCHC: 32.2 g/dL (ref 30.0–36.0)
MCV: 87.5 fL (ref 80.0–100.0)
Platelets: 92 10*3/uL — ABNORMAL LOW (ref 150–400)
RBC: 3.69 MIL/uL — ABNORMAL LOW (ref 4.22–5.81)
RDW: 18.8 % — ABNORMAL HIGH (ref 11.5–15.5)
WBC: 5.4 10*3/uL (ref 4.0–10.5)
nRBC: 0 % (ref 0.0–0.2)

## 2019-04-24 LAB — PROTIME-INR
INR: 2.1 — ABNORMAL HIGH (ref 0.8–1.2)
Prothrombin Time: 22.8 seconds — ABNORMAL HIGH (ref 11.4–15.2)

## 2019-04-24 LAB — FOLATE RBC
Folate, Hemolysate: 593 ng/mL
Folate, RBC: 1781 ng/mL (ref >498)
Hematocrit: 33.3 % — ABNORMAL LOW (ref 37.5–51.0)

## 2019-04-24 MED ORDER — FUROSEMIDE 80 MG PO TABS: 80.0000 mg | ORAL_TABLET | Freq: Every day | ORAL | Status: DC

## 2019-04-24 MED ORDER — WARFARIN SODIUM 2 MG PO TABS
3.0000 mg | ORAL_TABLET | Freq: Once | ORAL | Status: AC
  Administered 2019-04-24: 3 mg via ORAL
  Filled 2019-04-24: qty 1

## 2019-04-24 MED ORDER — FUROSEMIDE 80 MG PO TABS
80.0000 mg | ORAL_TABLET | Freq: Every day | ORAL | Status: DC
  Administered 2019-04-25: 80 mg via ORAL
  Filled 2019-04-24: qty 1

## 2019-04-24 NOTE — Evaluation (Signed)
Physical Therapy Evaluation Patient Details Name: [name] MRN: [id number] DOB: 23-Nov-1928 Today's Date: 04/24/2019   History of Present Illness  85 y.o. 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 In ED Chest x-ray shows central venous congestion and increased bilateral diffuse interstitial markings likely representing mild pulmonary edema. Admitted 04/21/19 for treatment of CHF  Clinical Impression  PTA pt living with wife in single story home with level entry. Pt independent in ADLs, and some iADLs, pt's sons due shopping. Pt is currently limited in safe mobility by generalized weakness and decreased endurance. Pt is currently min guard for bed mobility, transfers and ambulation of 80 feet with RW. PT recommending HHPT level rehab at discharge to help pt return to PLOF. PT will continue to follow acutely.     Follow Up Recommendations Home health PT;Supervision - Intermittent    Equipment Recommendations  None recommended by PT       Precautions / Restrictions Precautions Precautions: Fall Restrictions Weight Bearing Restrictions: No      Mobility  Bed Mobility Overal bed mobility: Needs Assistance Bed Mobility: Supine to Sit     Supine to sit: Supervision     General bed mobility comments: supervision for safety, increased time and effort required  Transfers Overall transfer level: Needs assistance Equipment used: Rolling walker (2 wheeled) Transfers: Sit to/from Stand Sit to Stand: Min guard         General transfer comment: min guard for safety, increased time and effort to power up, able to come to upright  before reaching to RW  Ambulation/Gait Ambulation/Gait assistance: Min guard Gait Distance (Feet): 80 Feet Assistive device: Rolling walker (2 wheeled) Gait Pattern/deviations: Step-through pattern;Decreased stance time - left;Decreased weight shift to left;Trunk flexed Gait velocity: slowed Gait velocity interpretation: <1.31 ft/sec, indicative of household ambulator General Gait Details: gait pattern dominated by L length decrepancy, with use of L toes and increased forward flexion over RW. overall slow steady gait with no LoB      Balance Overall balance assessment: Needs assistance Sitting-balance support: Feet supported;No upper extremity supported Sitting balance-Leahy Scale: Good     Standing balance support: No upper extremity supported;During functional activity Standing balance-Leahy Scale: Fair                               Pertinent Vitals/Pain Pain Assessment: No/denies pain    Home Living Family/patient expects to be discharged to:: Private residence Living Arrangements: Spouse/significant other Available Help at Discharge: Available 24 hours/day;Family Type of Home: House Home Access: Level entry     Home Layout: One level Home Equipment: Walker - 4 wheels;Grab bars - tub/shower      Prior Function Level of Independence: Independent with assistive device(s)                  Extremity/Trunk Assessment   Upper Extremity Assessment Upper Extremity Assessment: Defer to OT evaluation    Lower Extremity Assessment Lower Extremity Assessment: LLE deficits/detail;RLE deficits/detail RLE Deficits / Details: ROM WFL, strength grossly assessed 3+/5 RLE Coordination: WNL LLE Deficits / Details: Accident in his youth left pt with leg length descrepency, knee lacking full extension, and decreased  hip ROM, strength grossly 3/5  LLE Coordination: decreased gross motor;decreased fine motor       Communication   Communication: HOH  Cognition  Arousal/Alertness: Awake/alert Behavior During Therapy: WFL for tasks assessed/performed Overall Cognitive Status: Within Functional Limits for tasks assessed                                        General Comments General comments (skin integrity, edema, etc.): Pt on 2L O2 via [place] at entry with SaO2 97%O2, removed supplemental O2 and pt maintained SaO2 90%O2, with ambulation on RA SaO2 dropped to 82%O2, returned to 2L O2 and SaO2 rebounded to 92%O2, BP 115/66, HR 60-66bpm,        Assessment/Plan    PT Assessment Patient needs continued PT services  PT Problem List Decreased activity tolerance;Cardiopulmonary status limiting activity;Decreased mobility       PT Treatment Interventions DME instruction;Gait training;Functional mobility training;Therapeutic activities;Therapeutic exercise;Balance training;Cognitive remediation    PT Goals (Current goals can be found in the Care Plan section)  Acute Rehab PT Goals Patient Stated Goal: go home today PT Goal Formulation: With patient Time For Goal Achievement: 05/08/19 Potential to Achieve Goals: Good    Frequency Min 3X/week    AM-PAC PT "6 Clicks" Mobility  Outcome Measure Help needed turning from your back to your side while in a flat bed without using bedrails?: None Help needed moving from lying on your back to sitting on the side of a flat bed without using bedrails?: None Help needed moving to and from a bed to a chair (including a wheelchair)?: None Help needed standing up from a chair using your arms (e.g., wheelchair or bedside chair)?: None Help needed to walk in hospital room?: None Help needed climbing 3-5 steps with a railing? : A Little 6 Click Score: 23    End of Session Equipment Utilized During Treatment: Gait belt;Oxygen Activity Tolerance: Patient tolerated treatment well Patient left: in chair;with call bell/phone within reach;with chair alarm set Nurse Communication: Mobility status PT  Visit Diagnosis: Other abnormalities of gait and mobility (R26.89);Muscle weakness (generalized) (M62.81)    Time: [id number] PT Time Calculation (min) (ACUTE ONLY): 23 min   Charges:   PT Evaluation $PT Eval Moderate Complexity: 1 Mod PT Treatments $Gait Training: 8-22 mins        Jaelah Hauth B. Migdalia Dk PT, DPT Acute Rehabilitation Services Pager [phone] Office [phone]   [place] 04/24/2019, 1:05 PM

## 2019-04-24 NOTE — Progress Notes (Signed)
PROGRESS NOTE  [name] [id number] DOB: 02-24-1929 DOA: 04/21/2019 PCP: Eber Hong, MD  HPI/Recap of past 24 hours: [name] is a 85 y.o. 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  Wife provides most of the history at bedside.  She reports that for about 2 weeks patient has had continuous worsening lower extremity edema despite increasing his Lasix at home up to 80 mg a day.  She also noticed progressive weeping edema of his lower extremity.  They decided to finally come in at the recommendation of their cardiologist since he was beginning to have shortness of breath when ambulating short distances inside his home.  He denies any chest pain.  Denies any new fever or chills.  Denies any cough or runny nose.  No nausea, vomiting or abdominal pain.  Patient denies tobacco or illicit drug use.  Patient has a small glass of wine occasionally.  ED Course: He is afebrile and was normotensive on room air.  CBC showed leukopenia of 3.8 and hemoglobin of 10.5 which is around his baseline.  Platelets visible over 88 from a prior normal of 127 about 3 months ago.  BMP showed elevated creatinine of 1.31 from prior about 1.  INR of 2.3.  Chest x-ray shows central venous congestion and increased bilateral diffuse interstitial markings likely representing mild pulmonary edema.  EKG shows dual paced rhythm any ST changes.  04/24/19: Patient was seen and examined at his bedside this morning.  States he feels better.  Still requiring oxygen supplementation.  Not on oxygen supplementation at baseline.  INR subtherapeutic in the setting of bioprosthetic  aortic valve.  Ongoing diuresing for acute on chronic diastolic CHF.  Net I&O -3.8 L.  Last 2D echo done on 04/22/2019 showed reduced LVEF 45-50% from 55-60% in 2018.  Will consult cardiology to further assess.   Assessment/Plan: Active Problems:   OSA on CPAP   Congestive heart failure (CHF) (HCC)  Acute on chronic combined diastolic and systolic CHF Presented with elevated BNP greater than thousand Independently viewed chest x-ray done on admission which showed cardiomegaly with increased pulmonary vascularity indicative of pulmonary edema.  Right AICD in place. Last 2D echo on December 2018 revealed LVEF 55 to 60% Repeat 2D echo revealed LVEF 45 to 50% severely dilated left and right atrium Ongoing diuresing Net I&O -3.8 L Continue strict I's and O's and daily weight Continue daily BMPs Cardiology consult Continue to monitor blood pressure, electrolytes and renal function while on diuretics  Acute hypoxic respiratory failure likely secondary to acute on chronic combined diastolic and systolic CHF Normal oxygen supplementation at baseline Continue to maintain O2 saturation greater than 90% Continue diuresing and obtain home O2 evaluation for DC planning  Valvular disease status post 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 fib On Coumadin INR is subtherapeutic 1.9 on 04/23/2019 Pharmacy managing Coumadin Rate controlled on Coreg Rhythm controlled on amiodarone  QTC prolongation Independently reviewed twelve-lead EKG done on admission which shows QTC 526. Continue to avoid QTC prolonging agents AICD in place  Hypothyroidism Continue levothyroxine  Resolved AKI Baseline creatinine appears to be 1.0 with GFR greater than 60 Presented with creatinine of 1.3 with GFR of 48 Creatinine improving to  1.1 with GFR 55 Continue to avoid nephrotoxins Continue to monitor urine output Daily BMPs  OSA CPAP at night  Physical debility PT OT to assess.  Fall precautions   Disposition plan: Patient is currently not appropriate for discharge due to persistent acute hypoxic respiratory failure, acute on chronic diastolic CHF requiring further diuresing in the setting of multiple comorbidities and advanced age.  Possible discharge to home tomorrow 04/25/2019 or when cardiology signs off.  DVT prophylaxis:.Coumadin Code Status: DNR Family Communication: None at bedside. disposition Plan: Home with at least 2 midnight stays  Consults called: None.    Objective: Vitals:   04/23/19 1629 04/23/19 2006 04/23/19 2012 04/24/19 0535  BP: 133/77 128/68  133/71  Pulse: (!) 59 (!) 58 (!) 59 (!) 59  Resp:  20  17  Temp: 98.3 F (36.8 C) 98.5 F (36.9 C)  98.6 F (37 C)  TempSrc: Oral Oral  Oral  SpO2: 93% (!) 88% 95% 94%  Weight:    72 kg  Height:        Intake/Output Summary (Last 24 hours) at 04/24/2019 0828 Last data filed at 04/24/2019 0646 Gross per 24 hour  Intake 660 ml  Output 950 ml  Net -290 ml   Filed Weights   04/22/19 0614 04/23/19 0507 04/24/19 0535  Weight: 71.2 kg 72.4 kg 72 kg    Exam:   General: 85 y.o. year-old male well-developed well-nourished in no acute distress.  Alert and oriented x3.  Cardiovascular: Regular rate and rhythm no rubs or gallops no JVD or thyromegaly.    Respiratory: Mild rales at bases no wheezing noted.  Poor inspiratory effort.  Abdomen: Soft nontender exam normal bowel sounds present.   Musculoskeletal: Trace lower extremity edema.  2 out of 4 pulses in all 4 extremities.    Psychiatry: Mood is appropriate for condition and setting.   Data Reviewed: CBC: Recent Labs  Lab 04/21/19 1611 04/22/19 0102  WBC 3.8* 4.4  HGB 10.5* 10.6*  HCT 33.0* 32.5*  MCV 88.7 86.7  PLT 88* 93*   Basic Metabolic Panel: Recent Labs  Lab  04/21/19 1611 04/22/19 0102 04/23/19 0503  NA 137 142 139  K 3.8 3.2* 3.9  CL 103 106 106  CO2 26 27 26   GLUCOSE 109* 103* 105*  BUN 24* 21 24*  CREATININE 1.31* 1.17 1.20  CALCIUM 9.0 8.9 8.9  MG  --   --  2.5*   GFR: Estimated Creatinine Clearance: 38.4 mL/min (by C-G formula based on SCr of 1.2 mg/dL). Liver Function Tests: No results for input(s): AST, ALT, ALKPHOS, BILITOT, PROT, ALBUMIN in the last 168 hours. No results for input(s): LIPASE, AMYLASE in the last 168 hours. No results for input(s): AMMONIA in the last 168 hours. Coagulation Profile: Recent Labs  Lab 04/21/19 1634 04/22/19 0102 04/23/19 0503  INR 2.3* 2.1* 1.9*   Cardiac Enzymes: No results for input(s): CKTOTAL, CKMB, CKMBINDEX, TROPONINI in the last 168 hours. BNP (last 3 results) No results for input(s): PROBNP in the last 8760 hours. HbA1C: No results for input(s): HGBA1C in the last 72 hours. CBG: No results for input(s): GLUCAP in the last 168 hours. Lipid Profile: No results for input(s): CHOL, HDL, LDLCALC, TRIG, CHOLHDL, LDLDIRECT in the last 72 hours. Thyroid Function Tests: No results for input(s): TSH, T4TOTAL, FREET4, T3FREE, THYROIDAB in  the last 72 hours. Anemia Panel: Recent Labs    04/22/19 0102  VITAMINB12 1,056*   Urine analysis: No results found for: COLORURINE, APPEARANCEUR, LABSPEC, PHURINE, GLUCOSEU, HGBUR, BILIRUBINUR, KETONESUR, PROTEINUR, UROBILINOGEN, NITRITE, LEUKOCYTESUR Sepsis Labs: @LABRCNTIP (procalcitonin:4,lacticidven:4)  ) Recent Results (from the past 240 hour(s))  SARS CORONAVIRUS 2 (TAT 6-24 HRS) Nasopharyngeal Nasopharyngeal Swab     Status: None   Collection Time: 04/21/19  6:45 PM   Specimen: Nasopharyngeal Swab  Result Value Ref Range Status   SARS Coronavirus 2 NEGATIVE NEGATIVE Final    Comment: (NOTE) SARS-CoV-2 target nucleic acids are NOT DETECTED. The SARS-CoV-2 RNA is generally detectable in upper and lower respiratory specimens during the  acute phase of infection. Negative results do not preclude SARS-CoV-2 infection, do not rule out co-infections with other pathogens, and should not be used as the sole basis for treatment or other patient management decisions. Negative results must be combined with clinical observations, patient history, and epidemiological information. The expected result is Negative. Fact Sheet for Patients: SugarRoll.be Fact Sheet for Healthcare Providers: https://www.woods-mathews.com/ This test is not yet approved or cleared by the [place] FDA and  has been authorized for detection and/or diagnosis of SARS-CoV-2 by FDA under an Emergency Use Authorization (EUA). This EUA will remain  in effect (meaning this test can be used) for the duration of the COVID-19 declaration under Section 56 4(b)(1) of the Act, 21 U.S.C. section 360bbb-3(b)(1), unless the authorization is terminated or revoked sooner. Performed at [facility], [place] [address] [place], [place] [address]       Studies: No results found.  Scheduled Meds:  amiodarone  100 mg Oral q morning - 10a   amLODipine  5 mg Oral q morning - 10a   benazepril  40 mg Oral Daily   carvedilol  3.125 mg Oral BID WC   Chlorhexidine Gluconate Cloth  6 each Topical Daily   furosemide  60 mg Intravenous Daily   levothyroxine  50 mcg Oral QAC breakfast   polyethylene glycol  17 g Oral BID   potassium chloride SA  40 mEq Oral q morning - 10a   pravastatin  40 mg Oral Q supper   senna-docusate  2 tablet Oral BID   Warfarin - Pharmacist Dosing Inpatient   Does not apply q1800    Continuous Infusions:   LOS: 3 days     Kayleen Memos, MD [facility] Pager [phone]  If 7PM-7AM, please contact night-coverage [email or web address] Password [facility] 04/24/2019, 8:28 AM

## 2019-04-24 NOTE — Discharge Instructions (Addendum)
Torsemide tablets What is this medicine? TORSEMIDE (TORE se mide) is a diuretic. It helps you make more urine and to lose salt and excess water from your body. This medicine is used to treat high blood pressure, and edema or swelling from heart, kidney, or liver disease. This medicine may be used for other purposes; ask your health care provider or pharmacist if you have questions. COMMON BRAND NAME(S): Demadex What should I tell my health care provider before I take this medicine? They need to know if you have any of these conditions:  abnormal blood electrolytes  diabetes  gout  heart disease  kidney disease  liver disease  small amounts of urine, or difficulty passing urine  an unusual or allergic reaction to torsemide, sulfa drugs, other medicines, foods, dyes, or preservatives  pregnant or trying to get pregnant  breast-feeding How should I use this medicine? Take this medicine by mouth with a glass of water. Follow the directions on the prescription label. You may take this medicine with or without food. If it upsets your stomach, take it with food or milk. Do not take your medicine more often than directed. Remember that you will need to pass more urine after taking this medicine. Do not take your medicine at a time of day that will cause you problems. Do not take at bedtime. Talk to your pediatrician regarding the use of this medicine in children. Special care may be needed. Overdosage: If you think you have taken too much of this medicine contact a poison control center or emergency room at once. NOTE: This medicine is only for you. Do not share this medicine with others. What if I miss a dose? If you miss a dose, take it as soon as you can. If it is almost time for your next dose, take only that dose. Do not take double or extra doses. What may interact with this medicine?  alcohol  certain antibiotics given by injection certain heart medicines like  digoxin  diuretics  lithium  medicines for diabetes  medicines for blood pressure  medicines for cholesterol like cholestyramine  medicines that relax muscles for surgery  NSAIDs, medicines for pain and inflammation, like ibuprofen or naproxen  OTC supplements like ginseng and ephedra  probenecid  steroid medicines like prednisone or cortisone This list may not describe all possible interactions. Give your health care provider a list of all the medicines, herbs, non-prescription drugs, or dietary supplements you use. Also tell them if you smoke, drink alcohol, or use illegal drugs. Some items may interact with your medicine. What should I watch for while using this medicine? Visit your doctor or health care professional for regular checks on your progress. Check your blood pressure regularly. Ask your doctor or health care professional what your blood pressure should be, and when you should contact him or her. If you are a diabetic, check your blood sugar as directed. You may need to be on a special diet while taking this medicine. Check with your doctor. Also, ask how many glasses of fluid you need to drink a day. You must not get dehydrated. You may get drowsy or dizzy. Do not drive, use machinery, or do anything that needs mental alertness until you know how this drug affects you. Do not stand or sit up quickly, especially if you are an older patient. This reduces the risk of dizzy or fainting spells. Alcohol can make you more drowsy and dizzy. Avoid alcoholic drinks. What side effects may I  notice from receiving this medicine? Side effects that you should report to your doctor or health care professional as soon as possible:  allergic reactions such as skin rash or itching, hives, swelling of the lips, mouth, tongue or throat  blood in urine or stool  dry mouth  hearing loss or ringing in the ears  irregular heartbeat  muscle pain, weakness or cramps  pain or difficulty  passing urine  unusually weak or tired  vomiting or diarrhea Side effects that usually do not require medical attention (report to your doctor or health care professional if they continue or are bothersome):  dizzy or lightheaded  headache  increased thirst  passing large amounts of urine  sexual difficulties  stomach pain, upset or nausea This list may not describe all possible side effects. Call your doctor for medical advice about side effects. You may report side effects to FDA at 1-800-FDA-1088. Where should I keep my medicine? Keep out of the reach of children. Store at room temperature between 15 and 30 degrees C (59 and 86 degrees F). Throw away any unused medicine after the expiration date. NOTE: This sheet is a summary. It may not cover all possible information. If you have questions about this medicine, talk to your doctor, pharmacist, or health care provider.  2020 Elsevier/Gold Standard (2008-03-29 11:35:45) Heart Failure Exacerbation  Heart failure is a condition in which the heart does not fill up with enough blood, and therefore does not pump enough blood and oxygen to the body. When this happens, parts of the body do not get the blood and oxygen they need to function properly. This can cause symptoms such as breathing problems, fatigue, swelling, and confusion. Heart failure exacerbation refers to heart failure symptoms that get worse. The symptoms may get worse suddenly or develop slowly over time. Heart failure exacerbation is a serious medical problem that should be treated right away. What are the causes? A heart failure exacerbation can be triggered by:  Not taking your heart failure medicines correctly.  Infections.  Eating an unhealthy diet or a diet that is high in salt (sodium).  Drinking too much fluid.  Drinking alcohol.  Taking illegal drugs, such as cocaine or methamphetamine.  Not exercising. Other causes include:  Other heart conditions  such as an irregular heartbeat (arrhythmia).  Anemia.  Other medical problems, such as kidney failure. Sometimes the cause of the exacerbation is not known. What are the signs or symptoms? When heart failure symptoms suddenly or slowly get worse, this may be a sign of heart failure exacerbation. Symptoms of heart failure include:  Breathing problems or shortness of breath.  Chronic coughing or wheezing.  Fatigue.  Nausea or lack of appetite.  Feeling light-headed.  Confusion or memory loss.  Increased heart rate or irregular heartbeat.  Buildup of fluid in the legs, ankles, feet, or abdomen.  Difficulty breathing when lying down. How is this diagnosed? This condition is diagnosed based on:  Your symptoms and medical history.  A physical exam. You may also have tests, including:  Electrocardiogram (ECG). This test measures the electrical activity of your heart.  Echocardiogram. This test uses sound waves to take a picture of your heart to see how well it works.  Blood tests.  Imaging tests, such as: ? Chest X-ray. ? MRI. ? Ultrasound.  Stress test. This test examines how well your heart functions when you exercise. Your heart is monitored while you exercise on a treadmill or exercise bike. If you cannot  exercise, medicines may be used to increase your heartbeat in place of exercise.  Cardiac catheterization. During this test, a thin, flexible tube (catheter) is inserted into a blood vessel and threaded up to your heart. This test allows your health care provider to check the arteries that lead to your heart (coronary arteries).  Right heart catheterization. During this test, the pressure in your heart is measured. How is this treated? This condition may be treated by:  Adjusting your heart medicines.  Maintaining a healthy lifestyle. This includes: ? Eating a heart-healthy diet that is low in sodium. ? Not using any products that contain nicotine or tobacco,  such as cigarettes and e-cigarettes. ? Regular exercise. ? Monitoring your fluid intake. ? Monitoring your weight and reporting changes to your health care provider.  Treating sleep apnea, if you have this condition.  Surgery. This may include: ? Implanting a device that helps both sides of your heart contract at the same time (cardiac resynchronization therapy device). This can help with heart function and relieve heart failure symptoms. ? Implanting a device that can correct heart rhythm problems (implantable cardioverter defibrillator). ? Connecting a device to your heart to help it pump blood (ventricular assist device). ? Heart transplant. Follow these instructions at home: Medicines  Take over-the-counter and prescription medicines only as told by your health care provider.  Do not stop taking your medicines or change the amount you take. If you are having problems or side effects from your medicines, talk to your health care provider.  If you are having difficulty paying for your medicines, contact a social worker or your clinic. There are many programs to assist with medicine costs.  Talk to your health care provider before starting any new medicines or supplements.  Make sure your health care provider and pharmacist have a list of all the medicines you are taking. Eating and drinking   Avoid drinking alcohol.  Eat a heart-healthy diet as told by your health care provider. This includes: ? Plenty of fruits and vegetables. ? Lean proteins. ? Low-fat dairy. ? Whole grains. ? Foods that are low in sodium. Activity   Exercise regularly as told by your health care provider. Balance exercise with rest.  Ask your health care provider what activities are safe for you. This includes sexual activity, exercise, and daily tasks at home or work. Lifestyle  Do not use any products that contain nicotine or tobacco, such as cigarettes and e-cigarettes. If you need help quitting,  ask your health care provider.  Maintain a healthy weight. Ask your health care provider what weight is healthy for you.  Consider joining a patient support group. This can help with emotional problems you may have, such as stress and anxiety. General instructions  Talk to your health care provider about flu and pneumonia vaccines.  Keep a list of medicines that you are taking. This may help in emergency situations.  Keep all follow-up visits as told by your health care provider. This is important. Contact a health care provider if:  You have questions about your medicines or you miss a dose.  You feel anxious, depressed, or stressed.  You have swelling in your feet, ankles, legs, or abdomen.  You have shortness of breath during activity or exercise.  You have a cough.  You have a fever.  You have trouble sleeping.  You gain 2-3 lb (1-1.4 kg) in 24 hours or 5 lb (2.3 kg) in a week. Get help right away if:  You have chest pain.  You have shortness of breath while resting.  You have severe fatigue.  You are confused.  You have severe dizziness.  You have a rapid or irregular heartbeat.  You have nausea or you vomit.  You have a cough that is worse at night or you cannot lie flat.  You have a cough that will not go away.  You have severe depression or sadness. Summary  When heart failure symptoms get worse, it is called heart failure exacerbation.  Common causes of this condition include taking medicines incorrectly, infections, and drinking alcohol.  This condition may be treated by adjusting medicines, maintaining a healthy lifestyle, or surgery.  Do not stop taking your medicines or change the amount you take. If you are having problems or side effects from your medicines, talk to your health care provider. This information is not intended to replace advice given to you by your health care provider. Make sure you discuss any questions you have with your  health care provider. Document Released: 11/24/2016 Document Revised: 06/25/2017 Document Reviewed: 11/24/2016 Elsevier Patient Education  2020 [place]. Heart Failure Eating Plan Heart failure, also called congestive heart failure, occurs when your heart does not pump blood well enough to meet your body's needs for oxygen-rich blood. Heart failure is a long-term (chronic) condition. Living with heart failure can be challenging. However, following your health care provider's instructions about a healthy lifestyle and working with a diet and nutrition specialist (dietitian) to choose the right foods may help to improve your symptoms. What are tips for following this plan? Reading food labels  Check food labels for the amount of sodium per serving. Choose foods that have less than 140 mg (milligrams) of sodium in each serving.  Check food labels for the number of calories per serving. This is important if you need to limit your daily calorie intake to lose weight.  Check food labels for the serving size. If you eat more than one serving, you will be eating more sodium and calories than what is listed on the label.  Look for foods that are labeled as "sodium-free," "very low sodium," or "low sodium." ? Foods labeled as "reduced sodium" or "lightly salted" may still have more sodium than what is recommended for you. Cooking  Avoid adding salt when cooking. Ask your health care provider or dietitian before using salt substitutes.  Season food with salt-free seasonings, spices, or herbs. Check the label of seasoning mixes to make sure they do not contain salt.  Cook with heart-healthy oils, such as olive, canola, soybean, or sunflower oil.  Do not fry foods. Cook foods using low-fat methods, such as baking, boiling, grilling, and broiling.  Limit unhealthy fats when cooking by: ? Removing the skin from poultry, such as chicken. ? Removing all visible fats from meats. ? Skimming the fat  off from stews, soups, and gravies before serving them. Meal planning   Limit your intake of: ? Processed, canned, or pre-packaged foods. ? Foods that are high in trans fat, such as fried foods. ? Sweets, desserts, sugary drinks, and other foods with added sugar. ? Full-fat dairy products, such as whole milk.  Eat a balanced diet that includes: ? 4-5 servings of fruit each day and 4-5 servings of vegetables each day. At each meal, try to fill half of your plate with fruits and vegetables. ? Up to 6-8 servings of whole grains each day. ? Up to 2 servings of lean meat, poultry, or  fish each day. One serving of meat is equal to 3 oz. This is about the same size as a deck of cards. ? 2 servings of low-fat dairy each day. ? Heart-healthy fats. Healthy fats called omega-3 fatty acids are found in foods such as flaxseed and cold-water fish like sardines, salmon, and mackerel.  Aim to eat 25-35 g (grams) of fiber a day. Foods that are high in fiber include apples, broccoli, carrots, beans, peas, and whole grains.  Do not add salt or condiments that contain salt (such as soy sauce) to foods before eating.  When eating at a restaurant, ask that your food be prepared with less salt or no salt, if possible.  Try to eat 2 or more vegetarian meals each week.  Eat more home-cooked food and eat less restaurant, buffet, and fast food. General information  Do not eat more than 2,300 mg of salt (sodium) a day. The amount of sodium that is recommended for you may be lower, depending on your condition.  Maintain a healthy body weight as directed. Ask your health care provider what a healthy weight is for you. ? Check your weight every day. ? Work with your health care provider and dietitian to make a plan that is right for you to lose weight or maintain your current weight.  Limit how much fluid you drink. Ask your health care provider or dietitian how much fluid you can have each day.  Limit or avoid  alcohol as told by your health care provider or dietitian. Recommended foods The items listed may not be a complete list. Talk with your dietitian about what dietary choices are best for you. Fruits All fresh, frozen, and canned fruits. Dried fruits, such as raisins, prunes, and cranberries. Vegetables All fresh vegetables. Vegetables that are frozen without sauce or added salt. Low-sodium or sodium-free canned vegetables. Grains Bread with less than 80 mg of sodium per slice. Whole-wheat pasta, quinoa, and brown rice. Oats and oatmeal. Barley. [place]. Grits and cream of wheat. Whole-grain and whole-wheat cold cereal. Meats and other protein foods Lean cuts of meat. Skinless chicken and [place]. Fish with high omega-3 fatty acids, such as salmon, sardines, and other cold-water fishes. Eggs. Dried beans, peas, and edamame. Unsalted nuts and nut butters. Dairy Low-fat or nonfat (skim) milk and dried milk. Rice milk, soy milk, and almond milk. Low-fat or nonfat yogurt. Small amounts of reduced-sodium block cheese. Low-sodium cottage cheese. Fats and oils Olive, canola, soybean, flaxseed, or sunflower oil. Avocado. Sweets and desserts Apple sauce. Granola bars. Sugar-free pudding and gelatin. Frozen fruit bars. Seasoning and other foods Fresh and dried herbs. Lemon or lime juice. Vinegar. Low-sodium ketchup. Salt-free marinades, salad dressings, sauces, and seasonings. The items listed above may not be a complete list of foods and beverages you can eat. Contact a dietitian for more information. Foods to avoid The items listed may not be a complete list. Talk with your dietitian about what dietary choices are best for you. Fruits Fruits that are dried with sodium-containing preservatives. Vegetables Canned vegetables. Frozen vegetables with sauce or seasonings. Creamed vegetables. [place] fries. Onion rings. Pickled vegetables and sauerkraut. Grains Bread with more than 80 mg of sodium per slice.  Hot or cold cereal with more than 140 mg sodium per serving. Salted pretzels and crackers. Pre-packaged breadcrumbs. Bagels, croissants, and biscuits. Meats and other protein foods Ribs and chicken wings. Bacon, ham, pepperoni, bologna, salami, and packaged luncheon meats. Hot dogs, bratwurst, and sausage. Canned meat. Smoked meat  and fish. Salted nuts and seeds. Dairy Whole milk, half-and-half, and cream. Buttermilk. Processed cheese, cheese spreads, and cheese curds. Regular cottage cheese. Feta cheese. Shredded cheese. String cheese. Fats and oils Butter, lard, shortening, ghee, and bacon fat. Canned and packaged gravies. Seasoning and other foods Onion salt, garlic salt, table salt, and sea salt. Marinades. Regular salad dressings. Relishes, pickles, and olives. Meat flavorings and tenderizers, and bouillon cubes. Horseradish, ketchup, and mustard. Worcestershire sauce. Teriyaki sauce, soy sauce (including reduced sodium). Hot sauce and Tabasco sauce. Steak sauce, fish sauce, oyster sauce, and cocktail sauce. Taco seasonings. Barbecue sauce. Tartar sauce. The items listed above may not be a complete list of foods and beverages you should avoid. Contact a dietitian for more information. Summary  A heart failure eating plan includes changes that limit your intake of sodium and unhealthy fat, and it may help you lose weight or maintain a healthy weight. Your health care provider may also recommend limiting how much fluid you drink.  Most people with heart failure should eat no more than 2,300 mg of salt (sodium) a day. The amount of sodium that is recommended for you may be lower, depending on your condition.  Contact your health care provider or dietitian before making any major changes to your diet. This information is not intended to replace advice given to you by your health care provider. Make sure you discuss any questions you have with your health care provider. Document Released:  11/27/2016 Document Revised: 09/08/2018 Document Reviewed: 11/27/2016 Elsevier Patient Education  [place]. Heart Failure Action Plan A heart failure action plan helps you understand what to do when you have symptoms of heart failure. Follow the plan that was created by you and your health care provider. Review your plan each time you visit your health care provider. Red zone These signs and symptoms mean you should get medical help right away:  You have trouble breathing when resting.  You have a dry cough that is getting worse.  You have swelling or pain in your legs or abdomen that is getting worse.  You suddenly gain more than 2-3 lb (0.9-1.4 kg) in a day, or more than 5 lb (2.3 kg) in one week. This amount may be more or less depending on your condition.  You have trouble staying awake or you feel confused.  You have chest pain.  You do not have an appetite.  You pass out. If you experience any of these symptoms:  Call your local emergency services (911 in the [place] right away or seek help at the emergency department of the nearest hospital. Yellow zone These signs and symptoms mean your condition may be getting worse and you should make some changes:  You have trouble breathing when you are active or you need to sleep with extra pillows.  You have swelling in your legs or abdomen.  You gain 2-3 lb (0.9-1.4 kg) in one day, or 5 lb (2.3 kg) in one week. This amount may be more or less depending on your condition.  You get tired easily.  You have trouble sleeping.  You have a dry cough. If you experience any of these symptoms:  Contact your health care provider within the next day.  Your health care provider may adjust your medicines. Green zone These signs mean you are doing well and can continue what you are doing:  You do not have shortness of breath.  You have very little swelling or no new swelling.  Your  weight is stable (no gain or loss).  You  have a normal activity level.  You do not have chest pain or any other new symptoms. Follow these instructions at home:  Take over-the-counter and prescription medicines only as told by your health care provider.  Weigh yourself daily. Your target weight is __________ lb (__________ kg). ? Call your health care provider if you gain more than __________ lb (__________ kg) in a day, or more than __________ lb (__________ kg) in one week.  Eat a heart-healthy diet. Work with a diet and nutrition specialist (dietitian) to create an eating plan that is best for you.  Keep all follow-up visits as told by your health care provider. This is important. Where to find more information  American Heart Association: www.heart.org Summary  Follow the action plan that was created by you and your health care provider.  Get help right away if you have any symptoms in the Red zone. This information is not intended to replace advice given to you by your health care provider. Make sure you discuss any questions you have with your health care provider. Document Released: 08/22/2016 Document Revised: 06/25/2017 Document Reviewed: 08/22/2016 Elsevier Patient Education  2020 [place] on my medicine - Coumadin   (Warfarin)  Why was Coumadin prescribed for you? Coumadin was prescribed for you because you have a blood clot or a medical condition that can cause an increased risk of forming blood clots. Blood clots can cause serious health problems by blocking the flow of blood to the heart, lung, or brain. Coumadin can prevent harmful blood clots from forming. As a reminder your indication for Coumadin is:   Stroke Prevention Because Of Atrial Fibrillation  What test will check on my response to Coumadin? While on Coumadin (warfarin) you will need to have an INR test regularly to ensure that your dose is keeping you in the desired range. The INR (international normalized ratio) number is  calculated from the result of the laboratory test called prothrombin time (PT).  If an INR APPOINTMENT HAS NOT ALREADY BEEN MADE FOR YOU please schedule an appointment to have this lab work done by your health care provider within 7 days. Your INR goal is usually a number between:  2 to 3 or your provider may give you a more narrow range like 2-2.5.  Ask your health care provider during an office visit what your goal INR is.  What  do you need to  know  About  COUMADIN? Take Coumadin (warfarin) exactly as prescribed by your healthcare provider about the same time each day.  DO NOT stop taking without talking to the doctor who prescribed the medication.  Stopping without other blood clot prevention medication to take the place of Coumadin may increase your risk of developing a new clot or stroke.  Get refills before you run out.  What do you do if you miss a dose? If you miss a dose, take it as soon as you remember on the same day then continue your regularly scheduled regimen the next day.  Do not take two doses of Coumadin at the same time.  Important Safety Information A possible side effect of Coumadin (Warfarin) is an increased risk of bleeding. You should call your healthcare provider right away if you experience any of the following: ? Bleeding from an injury or your nose that does not stop. ? Unusual colored urine (red or dark brown) or unusual colored stools (red or black). ? Unusual  bruising for unknown reasons. ? A serious fall or if you hit your head (even if there is no bleeding).  Some foods or medicines interact with Coumadin (warfarin) and might alter your response to warfarin. To help avoid this: ? Eat a balanced diet, maintaining a consistent amount of Vitamin K. ? Notify your provider about major diet changes you plan to make. ? Avoid alcohol or limit your intake to 1 drink for women and 2 drinks for men per day. (1 drink is 5 oz. wine, 12 oz. beer, or 1.5 oz.  liquor.)  Make sure that ANY health care provider who prescribes medication for you knows that you are taking Coumadin (warfarin).  Also make sure the healthcare provider who is monitoring your Coumadin knows when you have started a new medication including herbals and non-prescription products.  Coumadin (Warfarin)  Major Drug Interactions  Increased Warfarin Effect Decreased Warfarin Effect  Alcohol (large quantities) Antibiotics (esp. Septra/Bactrim, Flagyl, Cipro) Amiodarone (Cordarone) Aspirin (ASA) Cimetidine (Tagamet) Megestrol (Megace) NSAIDs (ibuprofen, naproxen, etc.) Piroxicam (Feldene) Propafenone (Rythmol SR) Propranolol (Inderal) Isoniazid (INH) Posaconazole (Noxafil) Barbiturates (Phenobarbital) Carbamazepine (Tegretol) Chlordiazepoxide (Librium) Cholestyramine (Questran) Griseofulvin Oral Contraceptives Rifampin Sucralfate (Carafate) Vitamin K   Coumadin (Warfarin) Major Herbal Interactions  Increased Warfarin Effect Decreased Warfarin Effect  Garlic Ginseng Ginkgo biloba Coenzyme Q10 Green tea St. Johns wort    Coumadin (Warfarin) FOOD Interactions  Eat a consistent number of servings per week of foods HIGH in Vitamin K (1 serving =  cup)  Collards (cooked, or boiled & drained) Kale (cooked, or boiled & drained) Mustard greens (cooked, or boiled & drained) Parsley *serving size only =  cup Spinach (cooked, or boiled & drained) Swiss chard (cooked, or boiled & drained) Turnip greens (cooked, or boiled & drained)  Eat a consistent number of servings per week of foods MEDIUM-HIGH in Vitamin K (1 serving = 1 cup)  Asparagus (cooked, or boiled & drained) Broccoli (cooked, boiled & drained, or raw & chopped) Brussel sprouts (cooked, or boiled & drained) *serving size only =  cup Lettuce, raw (green leaf, endive, romaine) Spinach, raw Turnip greens, raw & chopped   These websites have more information on Coumadin (warfarin):   FailFactory.se; VeganReport.com.au;

## 2019-04-24 NOTE — Progress Notes (Signed)
ANTICOAGULATION CONSULT NOTE - Follow-up Consult  Pharmacy Consult for Warfarin Indication: atrial fibrillation  No Known Allergies  Patient Measurements: Height: 5\' 6"  (167.6 cm) Weight: 158 lb 11.2 oz (72 kg) IBW/kg (Calculated) : 63.8   Vital Signs: Temp: 98.6 F (37 C) (09/28 0535) Temp Source: Oral (09/28 0535) BP: 123/69 (09/28 0926) Pulse Rate: 59 (09/28 0535)  Labs: Recent Labs    04/21/19 1611 04/21/19 1634 04/22/19 0102 04/23/19 0503  HGB 10.5*  --  10.6*  --   HCT 33.0*  --  32.5*  --   PLT 88*  --  93*  --   LABPROT  --  24.7* 23.4* 21.4*  INR  --  2.3* 2.1* 1.9*  CREATININE 1.31*  --  1.17 1.20    Estimated Creatinine Clearance: 38.4 mL/min (by C-G formula based on SCr of 1.2 mg/dL).   Medical History: Past Medical History:  Diagnosis Date  . Anemia   . Arthritis   . Atrial flutter (HCC)    Amiodarone therapy, coumadin therapy, prior history of cardioversion and overdrive pacing  . Barrett's esophagus   . Benign prostatic hyperplasia   . Cardiomyopathy    Mixed ischemic and nonischemic with previous EF of 35-40%, most recently in June 2009 normalized to 55-60%. Echo 2/11 EF 55-60%  . Carotid artery disease (HCC)    Less than 50% bilateral internal carotid artery stenosis by Doppler 2009  . Carpal tunnel syndrome   . Chronic diastolic (congestive) heart failure ([place])   . Coronary atherosclerosis of native coronary artery    Status post coronary bypass grafting  . 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 Endocarditis   . Essential hypertension, benign   . Ganglion cyst    right wrist  . GERD (gastroesophageal reflux disease)    PMH  . Hiatal hernia   . HOH (hard of hearing)   . Hyperlipidemia   . Hypothyroidism   . Inguinal hernia    Left  . Internal hemorrhoids   . Iron deficiency anemia   . Obstructive sleep apnea   . Osteoporosis   . Paroxysmal atrial fibrillation (HCC)    Amiodarone therapy, status post Cox-Maze procedure but with recurrent atrial fibrillation while on amiodarone August 2012  . Presence of permanent cardiac pacemaker   . Symptomatic bradycardia   . Tubular adenoma of colon   . 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 Warfarin anticoagulation   . Wears glasses   . Wears partial dentures     Medications:  Scheduled:  . amiodarone  100 mg Oral q morning - 10a  . amLODipine  5 mg Oral q morning - 10a  . benazepril  40 mg Oral Daily  . carvedilol  3.125 mg Oral BID WC  . Chlorhexidine Gluconate Cloth  6 each Topical Daily  . furosemide  60 mg Intravenous Daily  . levothyroxine  50 mcg Oral QAC breakfast  . polyethylene glycol  17 g Oral BID  . potassium chloride SA  40 mEq Oral q morning - 10a  . pravastatin  40 mg Oral Q supper  . senna-docusate  2 tablet Oral BID  . Warfarin - Pharmacist Dosing Inpatient   Does not apply q1800    Assessment: 85 y.o. presenting with leg swelling. PMH significant for CHF, CAD, HTN, OSA, bioprosthetic valve and mitral valve repair, and Atrial Flutter on Warfarin PTA -  3 mg on Sundays and 2 mg all other days (Last dose 9/25 @10AM ). Admission INR therapeutic at 2.3. Pharmacy consulted for warfarin dosing. She is on amiodarone PTA.   INR today is now therapeutic at 2.1, cbc has remained stable. Will repeat 3mg  again tonight, back to home dose if INR at goal in am.   Goal of Therapy:  INR 2-3 Monitor platelets by anticoagulation protocol: Yes   Plan:  Warfarin 3 mg tonight  Monitor daily INR Monitor s/sx of bleeding and platelets   Thank you,   Erin Hearing PharmD., BCPS Clinical Pharmacist 04/24/2019 10:42 AM

## 2019-04-24 NOTE — Consult Note (Addendum)
Cardiology Consultation:   Patient ID: [name] MRN: [id number]; DOB: 1929-02-28  Admit date: 04/21/2019 Date of Consult: 04/24/2019  Primary Care Provider: Eber Hong, MD Primary Cardiologist: Rozann Lesches, MD  Primary Electrophysiologist:  Thompson Grayer, MD    Patient Profile:   [name] is a 85 y.o. male with a hx of 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, sick sinus syndrome with pacemaker, paroxysmal afib/flutter on coumadin, CAD s/p CABG x2 (SVG-D1 and SVG-OM2), chronic diastolic heart failure, OSA on CPAP, Stage III CKD, HTN, and chronic anemia who is being seen today for the evaluation of acute heart failure at the request of Dr. Nevada Crane.  History of Present Illness:   [name] sees Dr. Domenic Polite for the above cardiac issues. Patient has a history of CAD s/p CABG x 2 (SVG-D1 and SVG-OM2) in 2008, currently not on aspirin with concurrent use of coumadin and h/o of chronic anemia. He has a history of 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 as well as afib s/p MAZE and LA appendage stapling which were all done by Dr. Norm Parcel at [facility] in 2009. Patient currently follows with Dr. Brynda Greathouse for coumadin management. He has history of symptomatic sinus bradycardia and complete heart block s/p pacemaker insertion, h/o endocarditis and reinsertion in 2012 who he follows with Dr. Rayann Heman. Echo at that time showed normal EF with no wall motion abnormalities, moderate LVH, and biatrial enlargement. Echo in 2013 showed normal EF with diastolic dysfunction consistent with restrictive physiology. In 2018 Echo was ordered to reevaluate valves which showed EF 55-60%, mild LVH, a severe gradient across the AV bioprosthetic valve mean gradient (S): 42 mm Hg;Peak gradient (S): 77 mm Hg, mild regurgitation; gradient across the repaired MV; mean gradient 6 mmHg; severely dilated LA and RA. Patient saw Dr. Rayann Heman in November 2019 with no complaints; afib burden at that time was 0%. His last  visit with Dr. Domenic Polite was in December 2019 and was otherwise doing well on his medication regimen. Weights were down trending >>162 lbs in the office. Last PPM reading 02/2019 showed normal device functioning.  Patient presented to the ER 9/25 for increasing shortness of breath and lower extremity edema. Over the 2 weeks prior to admission the patient reported worsening lower leg edema despite increasing lasix dose. At baseline patient was taking 40 mg TID and had increased it to 40 mg four times daily (160mg  daily) for 1 day. The wife noticed weeping of the legs as well and weight had increased from 158 to 161 lbs. More recently the patient was having worsening shortness of breath and was unable to walk short distances in his home. Because of the worsening of symptoms they decided to go to the ED. Patient denies chest pain, nausea, or vomiting.   In the ED patient was afebrile and normotensive. Labs revealed potassium 3.8, Creatinine 1.31 (baseline 1). INR 2.3. WBC 3.8, Hgb 10.5, Platelets 88. BNP was 1,054. CXR showed central venous congestion and increased bilateral diffuse interstitial markings likely representing mild pulmonary edema. EKG showed  AV paced rhythym, 60 bpm with no acute changes.   Patient denies current tobacco/drug use. He drinks wine occasionally. Patient is not active at baseline and uses a walker or cane regularly. Baseline weight is reported at 158 lbs.  He denies recent illness, fever, or chills. Patient uses a CPAP at night. Denies orthopnea. Patient uses compression stockings daily.   Heart Pathway Score:     Past Medical History:  Diagnosis Date  Anemia    Arthritis    Atrial flutter (HCC)    Amiodarone therapy, coumadin therapy, prior history of cardioversion and overdrive pacing   Barrett's esophagus    Benign prostatic hyperplasia    Cardiomyopathy    Mixed ischemic and nonischemic with previous EF of 35-40%, most recently in June 2009 normalized to 55-60%.  Echo 2/11 EF 55-60%   Carotid artery disease (HCC)    Less than 50% bilateral internal carotid artery stenosis by Doppler 2009   Carpal tunnel syndrome    Chronic diastolic (congestive) heart failure (HCC)    Coronary atherosclerosis of native coronary artery    Status post coronary bypass grafting   Endocarditis    s/p PPM extraction by Dr Lovena Le 2012 endocarditis with enterococcus faecalis   Endocarditis    Essential hypertension, benign    Ganglion cyst    right wrist   GERD (gastroesophageal reflux disease)    PMH   Hiatal hernia    HOH (hard of hearing)    Hyperlipidemia    Hypothyroidism    Inguinal hernia    Left   Internal hemorrhoids    Iron deficiency anemia    Obstructive sleep apnea    Osteoporosis    Paroxysmal atrial fibrillation (HCC)    Amiodarone therapy, status post Cox-Maze procedure but with recurrent atrial fibrillation while on amiodarone August 2012   Presence of permanent cardiac pacemaker    Symptomatic bradycardia    Tubular adenoma of colon    Valvular heart disease    Status post 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   Warfarin anticoagulation    Wears glasses    Wears partial dentures     Past Surgical History:  Procedure Laterality Date   AORTIC VALVE REPLACEMENT     CARPAL TUNNEL RELEASE  2009   CATARACT EXTRACTION, BILATERAL  07/2011   COLONOSCOPY W/ BIOPSIES AND POLYPECTOMY     CORONARY ARTERY BYPASS GRAFT  2008   x2   HERNIA REPAIR  age 29   INGUINAL HERNIA REPAIR Left 08/30/2017   Procedure: LEFT INGUINAL HERNIA REPAIR;  Surgeon: Rolm Bookbinder, MD;  Location: [place];  Service: General;  Laterality: Left;  GENERAL ETT AND TAP BLOCK   INSERTION OF MESH Left 08/30/2017   Procedure: INSERTION OF MESH;  Surgeon: Rolm Bookbinder, MD;  Location: [place];  Service: General;  Laterality: Left;  GENERAL ETT AND TAP BLOCK   KNEE ARTHROSCOPY     MASS EXCISION Right 01/26/2018   Procedure: Right  wrist dorsal mass excision;  Surgeon: Iran Planas, MD;  Location: [place];  Service: Orthopedics;  Laterality: Right;  60 mins   PACEMAKER INSERTION  2009   Initially implanted at [facility], extracted by Dr Lovena Le for endocarditis and reimplanted 8/12   PATELLA FRACTURE SURGERY  age 18   screw placed and removed   TEE WITHOUT CARDIOVERSION N/A 07/05/2017   Procedure: TRANSESOPHAGEAL ECHOCARDIOGRAM (TEE);  Surgeon: Sanda Klein, MD;  Location: [facility] ENDOSCOPY;  Service: Cardiovascular;  Laterality: N/A;   TONSILLECTOMY  1939   TOTAL HIP ARTHROPLASTY       Home Medications:  Prior to Admission medications   Medication Sig Start Date End Date Taking? Authorizing Provider  amiodarone (PACERONE) 200 MG tablet TAKE 0.5 (1/2) TABLETS (100 MG TOTAL) BY MOUTH DAILY. Patient taking differently: Take 100 mg by mouth every morning.  10/03/18  Yes Allred, Jeneen Rinks, MD  amLODipine (NORVASC) 5 MG tablet Take 5 mg by mouth every morning.  01/18/17  Yes [provider]  amoxicillin (AMOXIL) 500 MG tablet Take 2,000 mg by mouth See admin instructions. Take four capsules (2000 mg) by mouth one hour prior to dental appointments   Yes [provider]  benazepril (LOTENSIN) 40 MG tablet TAKE 1 TABLET BY MOUTH EVERY DAY Patient taking differently: Take 40 mg by mouth every morning.  07/01/18  Yes Satira Sark, MD  carvedilol (COREG) 3.125 MG tablet TAKE 1 TABLET (3.125 MG TOTAL) BY MOUTH 2 (TWO) TIMES DAILY. Patient taking differently: Take 3.125 mg by mouth 2 (two) times daily with a meal.  06/22/18  Yes Satira Sark, MD  docusate sodium (COLACE) 100 MG capsule Take 200-300 mg by mouth at bedtime.    Yes [provider]  levothyroxine (SYNTHROID, LEVOTHROID) 50 MCG tablet Take 50 mcg by mouth daily before breakfast.   Yes [provider]  Multiple Vitamin (MULTIVITAMIN WITH MINERALS) TABS tablet Take 0.5 tablets by mouth 2 (two) times daily.   Yes [provider]    nitroGLYCERIN (NITROSTAT) 0.4 MG SL tablet Place 1 tablet (0.4 mg total) under the tongue every 5 (five) minutes as needed. Up to 3 doses, if no relief after 3rd dose, proceed to the ED for an evaluation. Patient taking differently: Place 0.4 mg under the tongue every 5 (five) minutes as needed for chest pain (Up to 3 doses, if no relief after 3rd dose, proceed to the ED for an evaluation.).  01/25/18  Yes Satira Sark, MD  potassium chloride SA (KLOR-CON M20) 20 MEQ tablet TAKE 20 MEQ BY MOUTH DAILY Patient taking differently: Take 20 mEq by mouth every morning.  12/15/18  Yes Satira Sark, MD  pravastatin (PRAVACHOL) 40 MG tablet TAKE 1 TABLET BY MOUTH EVERY EVENING Patient taking differently: Take 40 mg by mouth daily with supper.  06/22/18  Yes Satira Sark, MD  PRESCRIPTION MEDICATION Inhale into the lungs at bedtime. CPAP   Yes [provider]  Probiotic Product (PROBIOTIC DAILY PO) Take 1 tablet by mouth every morning.    Yes [provider]  Psyllium (METAMUCIL PO) Take 5 mLs by mouth every morning. Mix in orange juice   Yes [provider]  warfarin (COUMADIN) 2 MG tablet Take 2-3 mg by mouth See admin instructions. Take 1 1/2 tablet (3 mg) by mouth on Sunday morning, take 1 tablet (2 mg) on all other mornings of the week   Yes [provider]    Inpatient Medications: Scheduled Meds:  amiodarone  100 mg Oral q morning - 10a   amLODipine  5 mg Oral q morning - 10a   benazepril  40 mg Oral Daily   carvedilol  3.125 mg Oral BID WC   Chlorhexidine Gluconate Cloth  6 each Topical Daily   furosemide  60 mg Intravenous Daily   levothyroxine  50 mcg Oral QAC breakfast   polyethylene glycol  17 g Oral BID   potassium chloride SA  40 mEq Oral q morning - 10a   pravastatin  40 mg Oral Q supper   senna-docusate  2 tablet Oral BID   Warfarin - Pharmacist Dosing Inpatient   Does not apply q1800   Continuous Infusions:  PRN  Meds:   Allergies:   No Known Allergies  Social History:   Social History   Socioeconomic History   Marital status: Married    Spouse name: Not on file   Number of children: 3   Years of education: Not on  file   Highest education level: Not on file  Occupational History   Occupation: Retired  Scientist, product/process development strain: Not on file   Food insecurity    Worry: Not on file    Inability: Not on file   Transportation needs    Medical: Not on file    Non-medical: Not on file  Tobacco Use   Smoking status: Former Smoker    Packs/day: 0.30    Years: 8.00    Pack years: 2.40    Types: Cigarettes    Start date: 03/25/1944    Quit date: 07/27/1954    Years since quitting: 64.7   Smokeless tobacco: Never Used  Substance and Sexual Activity   Alcohol use: Yes    Alcohol/week: 0.0 standard drinks    Comment: glass of wine occ   Drug use: No   Sexual activity: Not on file  Lifestyle   Physical activity    Days per week: Not on file    Minutes per session: Not on file   Stress: Not on file  Relationships   Social connections    Talks on phone: Not on file    Gets together: Not on file    Attends religious service: Not on file    Active member of club or organization: Not on file    Attends meetings of clubs or organizations: Not on file    Relationship status: Not on file   Intimate partner violence    Fear of current or ex partner: Not on file    Emotionally abused: Not on file    Physically abused: Not on file    Forced sexual activity: Not on file  Other Topics Concern   Not on file  Social History Narrative   Not on file    Family History:   Family History  Problem Relation Age of Onset   Coronary artery disease Father    Other Father        Heart attack or stroke   Cancer Father    Colon cancer Mother    Diabetes Neg Hx    Hypertension Neg Hx    Esophageal cancer Neg Hx    Rectal cancer Neg Hx    Stomach cancer  Neg Hx      ROS:  Please see the history of present illness.  All other ROS reviewed and negative.     Physical Exam/Data:   Vitals:   04/23/19 2006 04/23/19 2012 04/24/19 0535 04/24/19 0926  BP: 128/68  133/71 123/69  Pulse: (!) 58 (!) 59 (!) 59   Resp: 20  17   Temp: 98.5 F (36.9 C)  98.6 F (37 C)   TempSrc: Oral  Oral   SpO2: (!) 88% 95% 94%   Weight:   72 kg   Height:        Intake/Output Summary (Last 24 hours) at 04/24/2019 1304 Last data filed at 04/24/2019 0852 Gross per 24 hour  Intake 896 ml  Output 950 ml  Net -54 ml   Last 3 Weights 04/24/2019 04/23/2019 04/22/2019  Weight (lbs) 158 lb 11.2 oz 159 lb 11.2 oz 156 lb 15.5 oz  Weight (kg) 71.986 kg 72.439 kg 71.2 kg     Body mass index is 25.61 kg/m.  General:  Frail WM in no acute distress HEENT: normal Lymph: no adenopathy Neck: moderate JVD Endocrine:  No thryomegaly Vascular: No carotid bruits; FA pulses 2+ bilaterally without bruits  Cardiac:  normal S1,  S2; systolic and diastolic murmur Lungs: 2L O2; minimal crackles left lung; no wheezing, rhonchi or rales  Abd: soft, nontender, no hepatomegaly  Ext: trace edema Musculoskeletal:  No deformities, BUE and BLE strength normal and equal Skin: warm and dry  Neuro:  CNs 2-12 intact, no focal abnormalities noted Psych:  Normal affect   EKG:  The EKG was personally reviewed and demonstrates:  AV paced rhythym, 60 bpm Telemetry:  Telemetry was personally reviewed and demonstrates:  AV dual paced rhythm HR in the 60s  Relevant CV Studies:  Echo 04/22/19  1. Left ventricular ejection fraction, by visual estimation, is 45 to 50%. The left ventricle has normal function. Normal left ventricular size. There is no left ventricular hypertrophy.  2. Abnormal septal motion consistent with RV pacemaker and right ventricular volume overload.  3. Left ventricular diastolic function could not be evaluated pattern of LV diastolic filling.  4. Global right ventricle has  severely reduced systolic function.The right ventricular size is severely enlarged. No increase in right ventricular wall thickness.  5. Left atrial size was severely dilated.  6. Right atrial size was severely dilated.  7. The mitral valve has been repaired/replaced. Moderate to severe mitral valve regurgitation. Mild mitral stenosis.  8. The tricuspid valve is grossly normal. Tricuspid valve regurgitation severe.  9. Aortic valve regurgitation was not visualized by color flow Doppler. 10. The pulmonic valve was grossly normal. Pulmonic valve regurgitation is trivial by color flow Doppler. 11. Moderately elevated pulmonary artery systolic pressure. 12. A pacer wire is visualized. 13. The inferior vena cava is dilated in size with <50% respiratory variability, suggesting right atrial pressure of 15 mmHg. 28. Compared to the 2018 study, mitral regurgitation has worsened and the AV prosthesis gradients have improved (possibly a function of lower cardiac output; the patient was anemic in 2018). Although the LVEF was reported higher in 2018, the side-by-side  comparison shows similar LV contractility.  PPM 03/09/19 Scheduled remote reviewed. Normal device function.   Laboratory Data:  High Sensitivity Troponin:  No results for input(s): TROPONINIHS in the last 720 hours.   Chemistry Recent Labs  Lab 04/22/19 0102 04/23/19 0503 04/24/19 1002  NA 142 139 138  K 3.2* 3.9 3.6  CL 106 106 102  CO2 27 26 26   GLUCOSE 103* 105* 181*  BUN 21 24* 24*  CREATININE 1.17 1.20 1.27*  CALCIUM 8.9 8.9 8.8*  GFRNONAA 55* 54* 50*  GFRAA >60 >60 58*  ANIONGAP 9 7 10     No results for input(s): PROT, ALBUMIN, AST, ALT, ALKPHOS, BILITOT in the last 168 hours. Hematology Recent Labs  Lab 04/21/19 1611 04/22/19 0102 04/24/19 1002  WBC 3.8* 4.4 5.4  RBC 3.72* 3.75* 3.69*  HGB 10.5* 10.6* 10.4*  HCT 33.0* 32.5* 32.3*  MCV 88.7 86.7 87.5  MCH 28.2 28.3 28.2  MCHC 31.8 32.6 32.2  RDW 18.7* 18.6*  18.8*  PLT 88* 93* 92*   BNP Recent Labs  Lab 04/21/19 2233  BNP 1,054.2*    DDimer No results for input(s): DDIMER in the last 168 hours.   Radiology/Studies:  Dg Chest 2 View  Result Date: 04/21/2019 CLINICAL DATA:  increased leg swelling, changes made to his diuretics but wife reports legs started to weep last night. Pt also having some SOB, no dyspnea noted. EXAM: CHEST - 2 VIEW COMPARISON:  Chest radiograph 12 03/15/2017, 08/02/2015 FINDINGS: Stable cardiomediastinal contours with enlarged heart size status post median sternotomy and CABG. Aortic arch calcification. Central venous congestion.  Stable left chest ICD. Diffusely increased interstitial markings likely representing mild pulmonary edema. No pneumothorax. Trace right pleural fluid. No acute finding in the visualized skeleton. IMPRESSION: Central venous congestion and increased bilateral diffuse interstitial markings likely mild pulmonary edema. Electronically Signed   By: Audie Pinto M.D.   On: 04/21/2019 16:28    Assessment and Plan:   Acute on chronic combined systolic and diastolic heart failure Presented 9/25 with worsening sob and lower leg swelling. BNP greater than 1,000. CXR showed cardiomegaly with pulmonary vascularity significant for pulmonary edema. Last 2D echo in 2018 showed LVEF 55-60%.  - Repeat echo showed EF 45-50%, right ventricle with severely reduced systolic function, LA and RA are severely dilated, and abnormal septal wall motion consistent with RV pacemaker and RV volume overload. Compared to 2018 study the mitral regurg has worsened and the AV prosthesis gradients have improved (possible a function of lower cardiac output in 2018; patient was anemic); similar LV contractility. - I/Os -3.6 L since admission - weight - 2 lbs. Patient reports baseline weight around 158 lbs.  - On IV lasix 60 mg  - creatinine slightly worse today - Patient is still on 2L O2 >>not on O2 at baseline. Try weaning off  as tolerated - Continue coreg 3.125 mg BID and benazepril 40 mg>> would not titrate at this time with soft pressures - will ask [employer] to investigate the device for any arrhythmic changes that might have worsened heart failure - Patient still with minimal fluid on exam and with 2L O2. Would consider 1 more day of IV diuresis and then switch to PO tomorrow   Cardiomyopathy - EF in 2018 was low normal at 55-60%>>> now EF 45-50% - Patient has a history of ischemic and nonischemic cardiomyopathy (previous EF 35-40% which normalized in 2009 to 55-60%) - No active chest pain; will not likely pursue ischemic work-up at this time - Would not titrate BB or ACE at this time for soft BP  Sick sinus syndrome s/p PPM 2009 with reinsertion in 2012 - Follows with Dr. Rayann Heman outpatient - last device check in August showed a normal functioning device - Will ask [employer] to investigate for abnormal activity  Paroxysmal Afib - On coumadin A/C per pharmacy - Rate controlled on coreg, rates in the 60s - rhythm controlled on amiodarone - appears to maintaining AV dual chamber paced rhythym - will ask [employer] to investigate  Aortic insufficiency  s/p bioprosthetic aortic valve  - On coumadin a/c per pharmacy; INR 1.9  - Recent echo with aortic valve mean gradient measures 21.0 mmHg and aortic valve peak gradient measures 37.0 mmHg  Moderate to severe Mitral valve regurgitation s/p MVR - On coumadin a/c per pharmacy - Echo this admission showed worsened MR as compared to echo in 2018; MV peak gradient, 21.0 mmHg - Will discuss with MD any further work-up at this time  CAD s/p CABG x 2 2008 - No active chest pain - Stable - Not on aspirin for chronic anemia and coumadin use  Hypertension - Coreg, benazepril 40 mg, and amlodipine 5 mg home meds - Pressures stable, 123/69  CKD Stage III - Creatinine was improving with diuresis, however today it is up 1.2 > 1.27 but still around baseline 1.3 -  avoid nephrotoxic agents - continue to monitor  Thrombocytopenia - 88 > 93 > 92. Level was normal in April - Work- up per IM - Be cautious with a/c  OSA on CPAP - compliant  Hypothyroidism -  levothyroxine   For questions or updates, please contact [place] Please consult [email or web address] for contact info under     Signed, Cadence Ninfa Meeker, PA-C  04/24/2019 1:04 PM   Patient seen and examined.  Agree with above documentation.  Patient is an 85 yo male with 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, sick sinus syndrome s/p pacemaker, paroxysmal afib/flutter on coumadin, CAD s/p CABG x2 (SVG-D1 and SVG-OM2), chronic systolic/diastolic heart failure, OSA on CPAP, Stage III CKD, HTN who presents with acute heart failure.  He reports that he was in his usual state of health up until about 1 month prior to admission.  Began having worsening dyspnea and LE edema.  He was taking lasix 80 mg qam/20 mg qhs, but increased his dose to 120 mg qam and then 160 mg without improvement, prompting admission for heart failure.  On admission, his Cr was 1.31 (baseline 1),  BNP 1054.  EKG shows AV paced rhythm.  He has been diuresed with IV lasix 60 mg daily.  Net negative 4.4L on admission.  States that he feels back to baseline.  Cr improved to 1.17, mild bump today (1.27).  TTE shows EF 45-50% (likely unchanged from prior EKG in 2018), mod-severe MR, improved AV prosthesis gradients, severe TR.  On exam, he is alert and oriented, RRR, 3/6 systolic murmur loudest at apex, lungs are CTAB, trace LE edema, + JVD.  Overall, appears euvolemic with exception of JVD, which is likely due to his TR.  Plan to transition to PO lasix 80 mg tomorrow.

## 2019-04-24 NOTE — Progress Notes (Signed)
Patient refused CPAP for the night. Patient wearing oxygen set at 2lpm with Sp02=96% at this time. Will continue to monitor patient.

## 2019-04-24 NOTE — Care Management Important Message (Signed)
Important Message  Patient Details  Name: [name] MRN: [id number] Date of Birth: Sep 18, 1928   Medicare Important Message Given:  Yes     Orbie Pyo 04/24/2019, 4:08 PM

## 2019-04-24 NOTE — Progress Notes (Signed)
Occupational Therapy Evaluation Patient Details Name: [name] MRN: [id number] DOB: March 29, 1929 Today's Date: 04/24/2019    History of Present Illness 85 y.o. 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 In ED Chest x-ray shows central venous congestion and increased bilateral diffuse interstitial markings likely representing mild pulmonary edema. Admitted 04/21/19 for treatment of CHF   Clinical Impression   PTA, pt was living at home with his wife in a single story, level entry home, pt reports he was independent with ADL some IADL, but wife does cooking/cleaning/drives pt to/from appointments and pt was modified independent with functional mobility at rollator level. Pt currently requires minguard-minA for ADL and functional mobility at RW level. Pt demonstrates decreased activity tolerance, decreased endurance, and generalized weakness. Pt on 2lnc throughout session able to maintain SpO2 >93% during mobility. Due to decline in current level of function, pt would benefit from acute OT to address established goals to facilitate safe D/C to venue listed below. At this time, recommend HHOT follow-up. Will continue to follow acutely.     Follow Up Recommendations  Home health OT;Supervision - Intermittent(with mobility)    Equipment Recommendations  None recommended by OT    Recommendations for Other Services       Precautions / Restrictions Precautions Precautions: Fall Restrictions Weight Bearing Restrictions: No      Mobility Bed Mobility Overal bed mobility: Needs Assistance Bed Mobility: Sit to Supine     Supine to sit:  Supervision Sit to supine: Supervision   General bed mobility comments: supervision for safety, increased time and effort required  Transfers Overall transfer level: Needs assistance Equipment used: Rolling walker (2 wheeled) Transfers: Sit to/from [facility] Sit to Stand: Min guard;Min assist Stand pivot transfers: Min guard;Min assist       General transfer comment: minguard-minA for stability in standing;intermittent mina during stand-pivot for stability and navigating RW side steps;vc for safe hand placement    Balance Overall balance assessment: Needs assistance Sitting-balance support: Feet supported;No upper extremity supported Sitting balance-Leahy Scale: Good     Standing balance support: During functional activity;Bilateral upper extremity supported Standing balance-Leahy Scale: Fair                             ADL either performed or assessed with clinical judgement   ADL Overall ADL's : Needs assistance/impaired Eating/Feeding: Set up;Sitting   Grooming: Set up;Sitting   Upper Body Bathing: Set up;Sitting   Lower Body Bathing: Min guard;Sit to/from stand;Minimal assistance   Upper Body Dressing : Set up;Sitting   Lower Body Dressing: Minimal assistance;Sit to/from stand   Toilet Transfer: Min Research officer, political party Details (indicate cue type and reason): simulated from recliner to Steele and Hygiene: Min guard;Minimal assistance;Sit to/from stand   Tub/ Banker: Minimal assistance   Functional mobility during ADLs: Min guard;Minimal assistance;Rolling walker General ADL Comments: pt demonstrates decreased activity tolerance, decreased endurance, and generalized weakness;SpO2 >93% on 2lnc throughout session;     Vision         Perception     Praxis      Pertinent Vitals/Pain Pain Assessment: No/denies pain     Hand  Dominance Right    Extremity/Trunk Assessment Upper Extremity Assessment Upper Extremity Assessment: Generalized weakness   Lower Extremity Assessment Lower Extremity Assessment: Defer to PT evaluation;LLE deficits/detail RLE Deficits / Details: ROM WFL, strength grossly assessed 3+/5 RLE Coordination: WNL LLE Deficits / Details: Accident in his youth left pt with leg length descrepency, knee lacking full extension, and decreased hip ROM, LLE Coordination: decreased gross motor;decreased fine motor   Cervical / Trunk Assessment Cervical / Trunk Assessment: Normal   Communication Communication Communication: HOH   Cognition Arousal/Alertness: Awake/alert Behavior During Therapy: WFL for tasks assessed/performed Overall Cognitive Status: Within Functional Limits for tasks assessed                                     General Comments  Pt on 2lnc spO2 >93% throughout session;limited mobility this session, pt requesting to get back to bed due to fatigue     Exercises     Shoulder Instructions      Home Living Family/patient expects to be discharged to:: Private residence Living Arrangements: Spouse/significant other Available Help at Discharge: Available 24 hours/day;Family Type of Home: House Home Access: Level entry     Home Layout: One level     Bathroom Shower/Tub: Occupational psychologist: Handicapped height Bathroom Accessibility: Yes How Accessible: Accessible via walker Home Equipment: Walker - 4 wheels;Grab bars - tub/shower          Prior Functioning/Environment Level of Independence: Independent with assistive device(s)        Comments: uses rollator;wife does cooking/cleaning        OT Problem List: Decreased strength;Decreased range of motion;Decreased activity tolerance;Impaired balance (sitting and/or standing);Decreased safety awareness;Decreased knowledge of use of DME or AE;Cardiopulmonary status limiting activity      OT  Treatment/Interventions: Self-care/ADL training;Therapeutic exercise;Energy conservation;DME and/or AE instruction;Patient/family education;Balance training    OT Goals(Current goals can be found in the care plan section) Acute Rehab OT Goals Patient Stated Goal: go home today OT Goal Formulation: With patient Time For Goal Achievement: 05/08/19 Potential to Achieve Goals: Good ADL Goals Pt Will Perform Grooming: with modified independence;standing;sitting Pt Will Perform Upper Body Dressing: with modified independence;sitting;standing Pt Will Perform Lower Body Dressing: with modified independence;sit to/from stand Pt Will Transfer to Toilet: with modified independence;ambulating Pt Will Perform Tub/Shower Transfer: with modified independence Additional ADL Goal #1: Pt will independently demonstrate use of 3 energy conservation strategies during ADL completion.  OT Frequency: Min 2X/week   Barriers to D/C:            Co-evaluation              AM-PAC OT "6 Clicks" Daily Activity     Outcome Measure Help from another person eating meals?: A Little Help from another person taking care of personal grooming?: A Little Help from another person toileting, which includes using toliet, bedpan, or urinal?: A Little Help from another person bathing (including washing, rinsing, drying)?: A Little Help from another person to put on and taking off regular upper body clothing?: A Little Help from another person to put on and taking off regular lower body clothing?: A Little 6 Click Score: 18   End of Session Equipment Utilized During Treatment: Rolling walker;Oxygen Nurse Communication: Mobility status  Activity Tolerance: Patient tolerated treatment well;Patient limited by fatigue Patient left: in bed;with call bell/phone within reach;with bed alarm set  OT Visit Diagnosis: Unsteadiness  on feet (R26.81);Other abnormalities of gait and mobility (R26.89)                Time:  1400-1415 OT Time Calculation (min): 15 min Charges:  OT General Charges $OT Visit: 1 Visit OT Evaluation $OT Eval Moderate Complexity: [place] OTR/L Acute Rehabilitation Services Office: [place] 04/24/2019, 2:24 PM

## 2019-04-25 DIAGNOSIS — I480 Paroxysmal atrial fibrillation: Secondary | ICD-10-CM

## 2019-04-25 LAB — CBC
HCT: 31 % — ABNORMAL LOW (ref 39.0–52.0)
Hemoglobin: 10.2 g/dL — ABNORMAL LOW (ref 13.0–17.0)
MCH: 28.7 pg (ref 26.0–34.0)
MCHC: 32.9 g/dL (ref 30.0–36.0)
MCV: 87.1 fL (ref 80.0–100.0)
Platelets: 87 10*3/uL — ABNORMAL LOW (ref 150–400)
RBC: 3.56 MIL/uL — ABNORMAL LOW (ref 4.22–5.81)
RDW: 18.6 % — ABNORMAL HIGH (ref 11.5–15.5)
WBC: 4.8 10*3/uL (ref 4.0–10.5)
nRBC: 0 % (ref 0.0–0.2)

## 2019-04-25 LAB — BASIC METABOLIC PANEL
Anion gap: 9 (ref 5–15)
BUN: 25 mg/dL — ABNORMAL HIGH (ref 8–23)
CO2: 26 mmol/L (ref 22–32)
Calcium: 8.9 mg/dL (ref 8.9–10.3)
Chloride: 105 mmol/L (ref 98–111)
Creatinine, Ser: 1.24 mg/dL (ref 0.61–1.24)
GFR calc Af Amer: 60 mL/min — ABNORMAL LOW (ref >60)
GFR calc non Af Amer: 52 mL/min — ABNORMAL LOW (ref >60)
Glucose, Bld: 84 mg/dL (ref 70–99)
Potassium: 3.8 mmol/L (ref 3.5–5.1)
Sodium: 140 mmol/L (ref 135–145)

## 2019-04-25 LAB — PROTIME-INR
INR: 2.1 — ABNORMAL HIGH (ref 0.8–1.2)
Prothrombin Time: 23.1 seconds — ABNORMAL HIGH (ref 11.4–15.2)

## 2019-04-25 LAB — LACTATE DEHYDROGENASE: LDH: 210 U/L — ABNORMAL HIGH (ref 98–192)

## 2019-04-25 MED ORDER — WARFARIN SODIUM 2 MG PO TABS
2.0000 mg | ORAL_TABLET | Freq: Once | ORAL | Status: AC
  Administered 2019-04-25: 2 mg via ORAL
  Filled 2019-04-25: qty 1

## 2019-04-25 MED ORDER — TORSEMIDE 20 MG PO TABS
40.0000 mg | ORAL_TABLET | Freq: Two times a day (BID) | ORAL | Status: DC
  Administered 2019-04-25 – 2019-04-26 (×2): 40 mg via ORAL
  Filled 2019-04-25 (×2): qty 2

## 2019-04-25 NOTE — Plan of Care (Signed)
  Problem: Clinical Measurements: Goal: Will remain free from infection Outcome: Progressing Note:  No s/s of infection noted. Goal: Respiratory complications will improve Outcome: Progressing Note:  No s/s of respiratory complications noted.   

## 2019-04-25 NOTE — Progress Notes (Signed)
ANTICOAGULATION CONSULT NOTE - Follow-up Consult  Pharmacy Consult for Warfarin Indication: atrial fibrillation  No Known Allergies  Patient Measurements: Height: 5\' 6"  (167.6 cm) Weight: 159 lb 13.3 oz (72.5 kg) IBW/kg (Calculated) : 63.8   Vital Signs: Temp: 98.4 F (36.9 C) (09/29 0650) Temp Source: Oral (09/29 0650) BP: 124/73 (09/29 0957) Pulse Rate: 60 (09/29 0957)  Labs: Recent Labs    04/23/19 0503 04/24/19 1002 04/25/19 0352  HGB  --  10.4* 10.2*  HCT  --  32.3* 31.0*  PLT  --  92* 87*  LABPROT 21.4* 22.8* 23.1*  INR 1.9* 2.1* 2.1*  CREATININE 1.20 1.27* 1.24    Estimated Creatinine Clearance: 37.2 mL/min (by C-G formula based on SCr of 1.24 mg/dL).     Assessment: 85 y.o. presenting with leg swelling. PMH significant for CHF, CAD, HTN, OSA, bioprosthetic valve and mitral valve repair, and Atrial Flutter on Warfarin PTA - 3 mg on Sundays and 2 mg all other days (Last dose 9/25 @10AM ). Admission INR therapeutic at 2.3. Pharmacy consulted for warfarin dosing. She is on amiodarone PTA.   INR today is now therapeutic at 2.1, cbc has remained stable.  Goal of Therapy:  INR 2-3 Monitor platelets by anticoagulation protocol: Yes   Plan:  Warfarin 2 mg tonight per home dose  Monitor daily INR Monitor s/sx of bleeding and platelets   Thank you,  Anette Guarneri, PharmD  04/25/2019 10:27 AM

## 2019-04-25 NOTE — Progress Notes (Signed)
Progress Note  Patient Name: [name] Date of Encounter: 04/25/2019  Primary Cardiologist: Rozann Lesches, MD   Subjective   No acute overnight events. Shortness of breath improved and patient thinks it is back to  Basically back to baseline. He has been able to ambulate some with no significant shortness of breath. No chest pain.   Inpatient Medications    Scheduled Meds: . amiodarone  100 mg Oral q morning - 10a  . amLODipine  5 mg Oral q morning - 10a  . benazepril  40 mg Oral Daily  . carvedilol  3.125 mg Oral BID WC  . furosemide  80 mg Oral Daily  . levothyroxine  50 mcg Oral QAC breakfast  . polyethylene glycol  17 g Oral BID  . potassium chloride SA  40 mEq Oral q morning - 10a  . pravastatin  40 mg Oral Q supper  . senna-docusate  2 tablet Oral BID  . warfarin  2 mg Oral ONCE-1800  . Warfarin - Pharmacist Dosing Inpatient   Does not apply q1800   Continuous Infusions:  PRN Meds:    Vital Signs    Vitals:   04/24/19 1729 04/24/19 2111 04/25/19 0650 04/25/19 0957  BP: 131/72 119/71 121/69 124/73  Pulse: 60 (!) 59 64 60  Resp:  16 18   Temp:  97.9 F (36.6 C) 98.4 F (36.9 C)   TempSrc:  Oral Oral   SpO2:  96% 95%   Weight:   72.5 kg   Height:        Intake/Output Summary (Last 24 hours) at 04/25/2019 1042 Last data filed at 04/25/2019 0653 Gross per 24 hour  Intake 720 ml  Output 2100 ml  Net -1380 ml   Last 3 Weights 04/25/2019 04/24/2019 04/23/2019  Weight (lbs) 159 lb 13.3 oz 158 lb 11.2 oz 159 lb 11.2 oz  Weight (kg) 72.5 kg 71.986 kg 72.439 kg      Telemetry    AV paced. - Personally Reviewed  ECG    No new tracing today. - Personally Reviewed  Physical Exam   GEN: Elderly Caucasian male resting comfortably in no acute distress.   Neck: Supple. JVD elevated with distended external jugular vein. Cardiac: RRR. Distinct S1 and S2. 3/6 systolic murmur noted throughout but loudest at apex. Respiratory: Clear to auscultation  bilaterally. No significant wheezes, rhonchi, or rales. GI: Soft, non-distended, and non-tender. Bowel sounds present. MS: Tace lower extremity edema. No deformities. Skin: Warm and dry. Neuro:  No focal deficits. Psych: Normal affect. Responds appropriately.  Labs    High Sensitivity Troponin:  No results for input(s): TROPONINIHS in the last 720 hours.    Chemistry Recent Labs  Lab 04/23/19 0503 04/24/19 1002 04/25/19 0352  NA 139 138 140  K 3.9 3.6 3.8  CL 106 102 105  CO2 26 26 26   GLUCOSE 105* 181* 84  BUN 24* 24* 25*  CREATININE 1.20 1.27* 1.24  CALCIUM 8.9 8.8* 8.9  GFRNONAA 54* 50* 52*  GFRAA >60 58* 60*  ANIONGAP 7 10 9      Hematology Recent Labs  Lab 04/22/19 0102 04/24/19 1002 04/25/19 0352  WBC 4.4 5.4 4.8  RBC 3.75* 3.69* 3.56*  HGB 10.6* 10.4* 10.2*  HCT 32.5*  33.3* 32.3* 31.0*  MCV 86.7 87.5 87.1  MCH 28.3 28.2 28.7  MCHC 32.6 32.2 32.9  RDW 18.6* 18.8* 18.6*  PLT 93* 92* 87*    BNP Recent Labs  Lab 04/21/19 2233  BNP 1,054.2*     DDimer No results for input(s): DDIMER in the last 168 hours.   Radiology    No results found.  Cardiac Studies   Echocardiogram 04/22/2019: Impressions: 1. Left ventricular ejection fraction, by visual estimation, is 45 to 50%. The left ventricle has normal function. Normal left ventricular size. There is no left ventricular hypertrophy.  2. Abnormal septal motion consistent with RV pacemaker and right ventricular volume overload.  3. Left ventricular diastolic function could not be evaluated pattern of LV diastolic filling.  4. Global right ventricle has severely reduced systolic function.The right ventricular size is severely enlarged. No increase in right ventricular wall thickness.  5. Left atrial size was severely dilated.  6. Right atrial size was severely dilated.  7. The mitral valve has been repaired/replaced. Moderate to severe mitral valve regurgitation. Mild mitral stenosis.  8. The  tricuspid valve is grossly normal. Tricuspid valve regurgitation severe.  9. Aortic valve regurgitation was not visualized by color flow Doppler. 10. The pulmonic valve was grossly normal. Pulmonic valve regurgitation is trivial by color flow Doppler. 11. Moderately elevated pulmonary artery systolic pressure. 12. A pacer wire is visualized. 13. The inferior vena cava is dilated in size with <50% respiratory variability, suggesting right atrial pressure of 15 mmHg. 20. Compared to the 2018 study, mitral regurgitation has worsened and the AV prosthesis gradients have improved (possibly a function of lower cardiac output; the patient was anemic in 2018). Although the LVEF was reported higher in 2018, the side-by-side  comparison shows similar LV contractility.  Patient Profile   Mr. Gaulding is a 85 y.o. 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 Nevada Crane.  Assessment & Plan    Acute on Chronic Combined CHF - Presented on 9/25 with worsening shortness of breath and lower extremity edema. - Chest x-ray showed cardiomegaly with central venous congestion and increased bilateral diffuse interstitial markings likely pulmonary edema. - BNP elevated at 1,054.2. - Echo this admission showed LVEF of 45-50% with abnormal septal motion consistent with RV pacemaker and right volume overloaded. RV noted to be severely enlarged with severely reduced systolic function. See full report above. - Patient initially started on IV Lasix but was switched to PO Lasix 80mg  daily yesterday with first dose scheduled for this morning. Documented urinary output of 2.1 L in the past 24 hours and net negative 4.9. Creatinine  stable. - Patient does not appear significantly volume overloaded on exam. - Will switch PO Lasix to PO Torsemide 40mg  twice daily.  - Continue Coreg 3.125mg  twice daily and Benazepril 40mg  daily. - Continue to monitor volume status.   Mitral Regurgitation s/p Biopresthetic MVR in 2009 - Echo this admission showed worsened MR as compared to Echo in 2018. MV peak gradient of 21.0 mmHg. - Noted to have moderate to severe MR and severe TR on Echo this admission.  - Dr. Audie Box discussed with Dr. Domenic Polite (primary cardiologist) - favoring more conservative approach with medical management at this time. - Continue Coumadin per Pharmacy.  Aortic Insufficiency s/p Bioprosthetic Aortic Valve in 2009 - Echo this admission showed no regurgitation by color flow doppler. AV mean gradient 21.0 mmHg and peak gradient 37.0 mmHg. Aortic  valve area by VT 1.45cm^2. - Continue Coumadin per Pharmacy.  Paroxysmal Atrial Fibrillation/ Sick Sinus Syndrome s/p PPM in 2009 with Reinsertion in 2012 - Telemetry shows AV paced rhythm.  - [employer] rep came and interrogated device. AV paced 91.4% of time. Measured impedance looks stable. MD reviewed. - Continue Coreg for rate control.  - Continue Amiodarone for rhythm control.  - Continue chronic anticoagulation with Coumadin. INR 2.1 today. Dosing per pharmacy.  Hypertension - BP well controlled at 124/73.  - Continue current medications: Amlodipine 5mg  daily, Coreg 3.125mg  twice daily., Benazepril 40mg  daily.  CKD Stage III - Creatinine stable and slightly improved from yesterday. - Continue to monitor.  Chronic Anemia - Hemoglobin stable at 10.2. Recent baseline in 10 to 11 range. - Checking LDH and Haptoglobin given anemia to evaluate for possible hemolysis from valve. LDH slightly elevated. If Haptoglobin is low, may need TEE.  Chronic Thrombocytopenia - Platelets stable at 87 today. Recent baseline in 90 to 120 range. - Continue to monitor.   Otherwise, per primary team.   For questions or updates, please contact [place] Please consult [email or web address] for contact info under        Signed, Darreld Mclean, PA-C  04/25/2019, 10:42 AM

## 2019-04-25 NOTE — Progress Notes (Signed)
PROGRESS NOTE  [name] [id number] DOB: 1928/09/17 DOA: 04/21/2019 PCP: Eber Hong, MD  HPI/Recap of past 24 hours: [name] is a 85 y.o. 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  Wife provides most of the history at bedside.  She reports that for about 2 weeks patient has had continuous worsening lower extremity edema despite increasing his Lasix at home up to 80 mg a day.  She also noticed progressive weeping edema of his lower extremity.  They decided to finally come in at the recommendation of their cardiologist since he was beginning to have shortness of breath when ambulating short distances inside his home.  He denies any chest pain.  Denies any new fever or chills.  Denies any cough or runny nose.  No nausea, vomiting or abdominal pain.  Patient denies tobacco or illicit drug use.  Patient has a small glass of wine occasionally.  ED Course: He is afebrile and was normotensive on room air.  CBC showed leukopenia of 3.8 and hemoglobin of 10.5 which is around his baseline.  Platelets visible over 88 from a prior normal of 127 about 3 months ago.  BMP showed elevated creatinine of 1.31 from prior about 1.  INR of 2.3.  Chest x-ray shows central venous congestion and increased bilateral diffuse interstitial markings likely representing mild pulmonary edema.  EKG shows dual paced rhythm any ST changes.  04/24/19: Patient was seen and examined at his bedside this morning.  States he feels better.  Still requiring oxygen supplementation.  Not on oxygen supplementation at baseline.  INR subtherapeutic in the setting of bioprosthetic  aortic valve.  Ongoing diuresing for acute on chronic diastolic CHF.  Net I&O -3.8 L.  Last 2D echo done on 04/22/2019 showed reduced LVEF 45-50% from 55-60% in 2018.  Will consult cardiology to further assess.  04/25/19: Patient seen and examined at his bedside this morning.  Continues to improve with ongoing diuresing.  Seen by cardiology, adjusting his medications with close monitoring.   Assessment/Plan: Active Problems:   OSA on CPAP   Congestive heart failure (CHF) (HCC)  Acute on chronic combined diastolic and systolic CHF Presented with elevated BNP greater than thousand Independently viewed chest x-ray done on admission which showed cardiomegaly with increased pulmonary vascularity indicative of pulmonary edema.  Right AICD in place. Last 2D echo on December 2018 revealed LVEF 55 to 60% Repeat 2D echo revealed LVEF 45 to 50% severely dilated left and right atrium Net I&O -4.9 L Continue strict I's and O's and daily weight Seen by cardiology with plan to optimize his medical management. Continue medications as recommended by cardiology Plan to possibly switch diuretics to torsemide today per cardiology  Resolving acute hypoxic respiratory failure likely secondary to acute on chronic combined diastolic and systolic CHF Not on oxygen supplementation at baseline Continue to maintain O2 saturation greater than 92% Continue diuresing Obtain home O2 evaluation for DC planning  Valvular disease status post 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 On Coumadin INR therapeutic 2.1 on 04/25/2019 Pharmacy managing Coumadin  Resolved hypokalemia post repletion Potassium 3.9 on 04/23/2019 from potassium 3.2  Paroxysmal A. fib On Coumadin Pharmacy managing Coumadin Rate controlled on Coreg Rhythm  controlled on amiodarone  QTC prolongation Independently reviewed twelve-lead EKG done on admission which shows QTC 526. Continue to avoid QTC prolonging agents AICD in place   Hypothyroidism Continue levothyroxine  Resolved AKI Baseline creatinine appears to be 1.0 with GFR greater than 60 Presented with creatinine of 1.3 with GFR of 48 Creatinine 1.24 on 04/25/2019 from 1.27 on 04/24/2019. Good urine output Continue to avoid nephrotoxins and continue to monitor urine output Daily BMPs  OSA CPAP at night  Physical debility PT OT assessment recommended home health PT OT Physical therapy Fall precautions   Disposition plan: Patient is currently not appropriate for discharge due to persistent acute hypoxic respiratory failure, acute on chronic diastolic CHF requiring further diuresing in the setting of multiple comorbidities and advanced age.  Possible discharge to home tomorrow 04/26/2019 or when cardiology signs off.  DVT prophylaxis:.Coumadin Code Status: DNR Family Communication: None at bedside. disposition Plan: Home possibly tomorrow 04/26/2019.   Objective: Vitals:   04/24/19 1729 04/24/19 2111 04/25/19 0650 04/25/19 0957  BP: 131/72 119/71 121/69 124/73  Pulse: 60 (!) 59 64 60  Resp:  16 18   Temp:  97.9 F (36.6 C) 98.4 F (36.9 C)   TempSrc:  Oral Oral   SpO2:  96% 95%   Weight:   72.5 kg   Height:        Intake/Output Summary (Last 24 hours) at 04/25/2019 1320 Last data filed at 04/25/2019 0653 Gross per 24 hour  Intake 720 ml  Output 2100 ml  Net -1380 ml   Filed Weights   04/23/19 0507 04/24/19 0535 04/25/19 0650  Weight: 72.4 kg 72 kg 72.5 kg    Exam:  . General: 85 y.o. year-old male well-developed in no acute distress.  Alert oriented x3.   . Cardiovascular: Regular rate and rhythm no rubs or gallops no JVD or thyromegaly noted. Marland Kitchen Respiratory: Clear to auscultation no wheezes or rales.  Poor inspiratory effort.  .  Abdomen: Soft nontender nondistended normal bowel sounds present.   . Musculoskeletal: Trace lower extremity edema.  2 out of 4 pulses in all 4 extremities.   Marland Kitchen Psychiatry: Mood is appropriate for condition  and setting.   Data Reviewed: CBC: Recent Labs  Lab 04/21/19 1611 04/22/19 0102 04/24/19 1002 04/25/19 0352  WBC 3.8* 4.4 5.4 4.8  HGB 10.5* 10.6* 10.4* 10.2*  HCT 33.0* 32.5*  33.3* 32.3* 31.0*  MCV 88.7 86.7 87.5 87.1  PLT 88* 93* 92* 87*   Basic Metabolic Panel: Recent Labs  Lab 04/21/19 1611 04/22/19 0102 04/23/19 0503 04/24/19 1002 04/25/19 0352  NA 137 142 139 138 140  K 3.8 3.2* 3.9 3.6 3.8  CL 103 106 106 102 105  CO2 26 27 26 26 26   GLUCOSE 109* 103* 105* 181* 84  BUN 24* 21 24* 24* 25*  CREATININE 1.31* 1.17 1.20 1.27* 1.24  CALCIUM 9.0 8.9 8.9 8.8* 8.9  MG  --   --  2.5*  --   --    GFR: Estimated Creatinine Clearance: 37.2 mL/min (by C-G formula based on SCr of 1.24 mg/dL). Liver Function Tests: No results for input(s): AST, ALT, ALKPHOS, BILITOT, PROT, ALBUMIN in the last 168 hours. No results for input(s): LIPASE, AMYLASE in the last 168 hours. No results for input(s): AMMONIA in the last 168 hours. Coagulation Profile: Recent Labs  Lab 04/21/19 1634 04/22/19 0102 04/23/19 0503 04/24/19 1002 04/25/19 0352  INR 2.3* 2.1* 1.9* 2.1* 2.1*   Cardiac Enzymes: No results for  input(s): CKTOTAL, CKMB, CKMBINDEX, TROPONINI in the last 168 hours. BNP (last 3 results) No results for input(s): PROBNP in the last 8760 hours. HbA1C: No results for input(s): HGBA1C in the last 72 hours. CBG: No results for input(s): GLUCAP in the last 168 hours. Lipid Profile: No results for input(s): CHOL, HDL, LDLCALC, TRIG, CHOLHDL, LDLDIRECT in the last 72 hours. Thyroid Function Tests: No results for input(s): TSH, T4TOTAL, FREET4, T3FREE, THYROIDAB in the last 72 hours. Anemia Panel: No results for input(s): VITAMINB12, FOLATE, FERRITIN, TIBC, IRON, RETICCTPCT in the last 72 hours. Urine analysis: No results found for: COLORURINE, APPEARANCEUR, LABSPEC, PHURINE, GLUCOSEU, HGBUR, BILIRUBINUR, KETONESUR, PROTEINUR, UROBILINOGEN, NITRITE, LEUKOCYTESUR Sepsis Labs:  @[email or web address] (procalcitonin:4,lacticidven:4)  ) Recent Results (from the past 240 hour(s))  SARS CORONAVIRUS 2 (TAT 6-24 HRS) Nasopharyngeal Nasopharyngeal Swab     Status: None   Collection Time: 04/21/19  6:45 PM   Specimen: Nasopharyngeal Swab  Result Value Ref Range Status   SARS Coronavirus 2 NEGATIVE NEGATIVE Final    Comment: (NOTE) SARS-CoV-2 target nucleic acids are NOT DETECTED. The SARS-CoV-2 RNA is generally detectable in upper and lower respiratory specimens during the acute phase of infection. Negative results do not preclude SARS-CoV-2 infection, do not rule out co-infections with other pathogens, and should not be used as the sole basis for treatment or other patient management decisions. Negative results must be combined with clinical observations, patient history, and epidemiological information. The expected result is Negative. Fact Sheet for Patients: SugarRoll.be Fact Sheet for Healthcare Providers: https://www.woods-mathews.com/ This test is not yet approved or cleared by the [place] FDA and  has been authorized for detection and/or diagnosis of SARS-CoV-2 by FDA under an Emergency Use Authorization (EUA). This EUA will remain  in effect (meaning this test can be used) for the duration of the COVID-19 declaration under Section 56 4(b)(1) of the Act, 21 U.S.C. section 360bbb-3(b)(1), unless the authorization is terminated or revoked sooner. Performed at [facility], [place] [address], [place], [place] [address]       Studies: No results found.  Scheduled Meds: . amiodarone  100 mg Oral q morning - 10a  . amLODipine  5 mg Oral q morning - 10a  . benazepril  40 mg Oral Daily  . carvedilol  3.125 mg Oral BID WC  . levothyroxine  50 mcg Oral QAC breakfast  . polyethylene glycol  17 g Oral BID  . potassium chloride SA  40 mEq Oral q morning - 10a  . pravastatin  40 mg Oral Q supper  . senna-docusate  2  tablet Oral BID  . torsemide  40 mg Oral BID  . warfarin  2 mg Oral ONCE-1800  . Warfarin - Pharmacist Dosing Inpatient   Does not apply q1800    Continuous Infusions:   LOS: 4 days     Kayleen Memos, MD [facility] Pager [phone]  If 7PM-7AM, please contact night-coverage [email or web address] Password [email or web address] 04/25/2019, 1:20 PM

## 2019-04-25 NOTE — TOC Initial Note (Signed)
Transition of Care [facility]) - Initial/Assessment Note    Patient Details  Name: [name] MRN: [id number] Date of Birth: [birth year]  [facility]) CM/SW Contact:    Bethena Roys, RN Phone Number: 04/25/2019, 11:17 AM  Clinical Narrative:    Pt presented for CHF- PTA from home with spouse. Plan to return home once stable. CM did offer medicare.gove home compare list. Patient has used [facility] in the past and wants to use again. Referral sent to [facility] and [facility] to begin within 24-48 hours post transition home. Pt's wife will provide transportation home. Patient has PCP and gets his medications without any issues. Pt has DME Cane and RW in the home. No further needs from CM at this time.               Expected Discharge Plan: [place] Barriers to Discharge: No Barriers Identified   Patient Goals and CMS Choice Patient states their goals for this hospitalization and ongoing recovery are:: "to return home" CMS Medicare.gov Compare Post Acute Care list provided to:: Patient Choice offered to / list presented to : Patient  Expected Discharge Plan and Services Expected Discharge Plan: [place] In-house Referral: NA Discharge Planning Services: CM Consult Post Acute Care Choice: [place] arrangements for the past 2 months: Single Family Home                      HH Arranged: RN, Disease Management, PT, OT HH Agency: [place]) Date [facility] Agency Contacted: 04/25/19 Time HH Agency Contacted: 35 Representative spoke with at [place]: [place] Arrangements/Services Living arrangements for the past 2 months: [place] with:: Spouse Patient language and need for interpreter reviewed:: Yes Do you feel safe going back to the place where you live?: Yes      Need for Family Participation in Patient Care: Yes (Comment) Care giver support system in place?:  Yes (comment) Current home services: DME(Pt has Kasandra Knudsen and RW) Criminal Activity/Legal Involvement Pertinent to Current Situation/Hospitalization: No - Comment as needed  Activities of Daily Living      Permission Sought/Granted Permission sought to share information with : Facility Sport and exercise psychologist, Family Supports Permission granted to share information with : Yes, Verbal Permission Granted     Permission granted to share info w AGENCY: [facility]        Emotional Assessment Appearance:: Appears stated age Attitude/Demeanor/Rapport: Engaged Affect (typically observed): Accepting Orientation: : Oriented to  Time, Oriented to Place, Oriented to Self, Oriented to Situation Alcohol / Substance Use: Not Applicable Psych Involvement: No (comment)  Admission diagnosis:  Bilateral lower extremity edema [R60.0] Patient Active Problem List   Diagnosis Date Noted  . Congestive heart failure (CHF) ([place]) 04/21/2019  . History of atrial fibrillation   . Anticoagulation monitoring by pharmacist   . History of prosthetic aortic valve   . Inguinal hernia 08/30/2017  . Bacteremia   . Hypoxia 07/03/2017  . Iron deficiency anemia due to chronic blood loss 05/14/2016  . Barrett's esophagus without dysplasia 01/22/2016  . Anemia 08/01/2015  . Symptomatic anemia 08/01/2015  . Chronic diastolic CHF (congestive heart failure) ([place]) 08/01/2015  . Sick sinus syndrome ([place]) 03/20/2013  . Pacemaker 11/24/2011  . Warfarin anticoagulation   . Paroxysmal atrial fibrillation (HCC)   . Valvular heart disease   . Atrial flutter ([place])   . Carotid artery disease ([place])   .  History of endocarditis   . HYPERLIPIDEMIA 10/10/2010  . OSA on CPAP 11/01/2009  . Hypertensive cardiovascular disease 04/10/2009  . Coronary atherosclerosis of native coronary artery 04/10/2009  . Secondary cardiomyopathy ([place]) 04/10/2009   PCP:  Eber Hong, MD Pharmacy:   [id number] - [place], [place] [address] Phone: [phone] Fax: [phone]     Social Determinants of Health (SDOH) Interventions    Readmission Risk Interventions Readmission Risk Prevention Plan 04/25/2019  Transportation Screening Complete  Home Care Screening Complete  Medication Review (RN CM) Complete  Some recent data might be hidden

## 2019-04-26 LAB — PROTIME-INR
INR: 2 — ABNORMAL HIGH (ref 0.8–1.2)
Prothrombin Time: 22.3 seconds — ABNORMAL HIGH (ref 11.4–15.2)

## 2019-04-26 LAB — BASIC METABOLIC PANEL
Anion gap: 11 (ref 5–15)
BUN: 29 mg/dL — ABNORMAL HIGH (ref 8–23)
CO2: 26 mmol/L (ref 22–32)
Calcium: 9 mg/dL (ref 8.9–10.3)
Chloride: 104 mmol/L (ref 98–111)
Creatinine, Ser: 1.2 mg/dL (ref 0.61–1.24)
GFR calc Af Amer: >60 mL/min (ref >60)
GFR calc non Af Amer: 54 mL/min — ABNORMAL LOW (ref >60)
Glucose, Bld: 88 mg/dL (ref 70–99)
Potassium: 3.5 mmol/L (ref 3.5–5.1)
Sodium: 141 mmol/L (ref 135–145)

## 2019-04-26 LAB — HAPTOGLOBIN: Haptoglobin: 36 mg/dL — ABNORMAL LOW (ref 38–329)

## 2019-04-26 LAB — CBC
HCT: 33.1 % — ABNORMAL LOW (ref 39.0–52.0)
Hemoglobin: 10.8 g/dL — ABNORMAL LOW (ref 13.0–17.0)
MCH: 28.4 pg (ref 26.0–34.0)
MCHC: 32.6 g/dL (ref 30.0–36.0)
MCV: 87.1 fL (ref 80.0–100.0)
Platelets: 102 10*3/uL — ABNORMAL LOW (ref 150–400)
RBC: 3.8 MIL/uL — ABNORMAL LOW (ref 4.22–5.81)
RDW: 18.7 % — ABNORMAL HIGH (ref 11.5–15.5)
WBC: 5.3 10*3/uL (ref 4.0–10.5)
nRBC: 0 % (ref 0.0–0.2)

## 2019-04-26 LAB — MAGNESIUM: Magnesium: 2.1 mg/dL (ref 1.7–2.4)

## 2019-04-26 LAB — PHOSPHORUS: Phosphorus: 3.5 mg/dL (ref 2.5–4.6)

## 2019-04-26 MED ORDER — TORSEMIDE 20 MG PO TABS: 40.0000 mg | ORAL_TABLET | Freq: Every day | ORAL | 0 refills | Status: DC

## 2019-04-26 MED ORDER — TORSEMIDE 20 MG PO TABS: 40.0000 mg | ORAL_TABLET | Freq: Every day | ORAL | Status: DC

## 2019-04-26 MED ORDER — WARFARIN SODIUM 2 MG PO TABS: 2.0000 mg | ORAL_TABLET | Freq: Once | ORAL | Status: DC

## 2019-04-26 MED ORDER — POTASSIUM CHLORIDE CRYS ER 20 MEQ PO TBCR: 40.0000 meq | EXTENDED_RELEASE_TABLET | Freq: Every morning | ORAL | 0 refills | Status: DC

## 2019-04-26 NOTE — Progress Notes (Addendum)
Progress Note  Patient Name: [name] Date of Encounter: 04/26/2019  Primary Cardiologist: Rozann Lesches, MD   Subjective   No acute overnight events. Patient feeling much better and eager to go home. Shortness of breath and lower extremity edema have improved. No chest pain or palpitations.   Inpatient Medications    Scheduled Meds: . amiodarone  100 mg Oral q morning - 10a  . amLODipine  5 mg Oral q morning - 10a  . benazepril  40 mg Oral Daily  . carvedilol  3.125 mg Oral BID WC  . levothyroxine  50 mcg Oral QAC breakfast  . polyethylene glycol  17 g Oral BID  . potassium chloride SA  40 mEq Oral q morning - 10a  . pravastatin  40 mg Oral Q supper  . senna-docusate  2 tablet Oral BID  . torsemide  40 mg Oral BID  . Warfarin - Pharmacist Dosing Inpatient   Does not apply q1800   Continuous Infusions:  PRN Meds:    Vital Signs    Vitals:   04/25/19 1706 04/25/19 2138 04/26/19 0154 04/26/19 0602  BP: 131/82 135/79  120/73  Pulse: 75   75  Resp:  18  20  Temp:  98.5 F (36.9 C)  98.7 F (37.1 C)  TempSrc:  Oral  Oral  SpO2:  94%  95%  Weight:   70.9 kg   Height:        Intake/Output Summary (Last 24 hours) at 04/26/2019 0801 Last data filed at 04/26/2019 0603 Gross per 24 hour  Intake 840 ml  Output 2675 ml  Net -1835 ml   Last 3 Weights 04/26/2019 04/25/2019 04/24/2019  Weight (lbs) 156 lb 4.9 oz 159 lb 13.3 oz 158 lb 11.2 oz  Weight (kg) 70.9 kg 72.5 kg 71.986 kg      Telemetry    AV paced. - Personally Reviewed  ECG    No new ECG tracing today. - Personally Reviewed  Physical Exam   GEN: Elderly Caucasian male resting comfortably in no acute distress.   Neck: Supple. JVD elevated with distended external jugular veins. Cardiac: RRR. Distinct S1 and S2. 3/6 systolic murmur heard throughout but loudest at apex. No gallops or rubs.  Respiratory: No increased work of breathing. Normal respiratory rate. Clear to auscultation bilaterally. No  wheezes, rhonchi, or rales.  GI: Soft, non-distended, and non-tender. Bowel sounds present. MS: No lower extremity edema. No deformity. Neuro:  No focal deficits. Psych: Normal affect. Responds appropriately.  Labs    High Sensitivity Troponin:  No results for input(s): TROPONINIHS in the last 720 hours.    Chemistry Recent Labs  Lab 04/24/19 1002 04/25/19 0352 04/26/19 0337  NA 138 140 141  K 3.6 3.8 3.5  CL 102 105 104  CO2 26 26 26   GLUCOSE 181* 84 88  BUN 24* 25* 29*  CREATININE 1.27* 1.24 1.20  CALCIUM 8.8* 8.9 9.0  GFRNONAA 50* 52* 54*  GFRAA 58* 60* >60  ANIONGAP 10 9 11      Hematology Recent Labs  Lab 04/24/19 1002 04/25/19 0352 04/26/19 0337  WBC 5.4 4.8 5.3  RBC 3.69* 3.56* 3.80*  HGB 10.4* 10.2* 10.8*  HCT 32.3* 31.0* 33.1*  MCV 87.5 87.1 87.1  MCH 28.2 28.7 28.4  MCHC 32.2 32.9 32.6  RDW 18.8* 18.6* 18.7*  PLT 92* 87* 102*    BNP Recent Labs  Lab 04/21/19 2233  BNP 1,054.2*     DDimer No results for  input(s): DDIMER in the last 168 hours.   Radiology    No results found.  Cardiac Studies   Echocardiogram 04/22/2019: Impressions: 1. Left ventricular ejection fraction, by visual estimation, is 45 to 50%. The left ventricle has normal function. Normal left ventricular size. There is no left ventricular hypertrophy. 2. Abnormal septal motion consistent with RV pacemaker and right ventricular volume overload. 3. Left ventricular diastolic function could not be evaluated pattern of LV diastolic filling. 4. Global right ventricle has severely reduced systolic function.The right ventricular size is severely enlarged. No increase in right ventricular wall thickness. 5. Left atrial size was severely dilated. 6. Right atrial size was severely dilated. 7. The mitral valve has been repaired/replaced. Moderate to severe mitral valve regurgitation. Mild mitral stenosis. 8. The tricuspid valve is grossly normal. Tricuspid valve regurgitation  severe. 9. Aortic valve regurgitation was not visualized by color flow Doppler. 10. The pulmonic valve was grossly normal. Pulmonic valve regurgitation is trivial by color flow Doppler. 11. Moderately elevated pulmonary artery systolic pressure. 12. A pacer wire is visualized. 13. The inferior vena cava is dilated in size with <50% respiratory variability, suggesting right atrial pressure of 15 mmHg. 50. Compared to the 2018 study, mitral regurgitation has worsened and the AV prosthesis gradients have improved (possibly a function of lower cardiac output; the patient was anemic in 2018). Although the LVEF was reported higher in 2018, the side-by-side comparison shows similar LV contractility.  Patient Profile     Mr. [name] is a 85 y.o. male with a history of 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, sick sinus syndrome/complete heart block with pacemaker, paroxysmal afib/flutter on coumadin, CAD s/p CABG x2(SVG-D1 and SVG-OM2), chronic diastolic heart failure, OSA on CPAP, Stage III CKD, HTN,and chronicanemiawho is being seen today for the evaluation of acute heart failureat the request of Dr. [place] Crane.  Assessment & Plan    Acute on Chronic Combined CHF - Presented on 9/25 with worsening shortness of breath and lower extremity edema. - Chest x-ray showed cardiomegaly with central venous congestion and increased bilateral diffuse interstitial markings likely pulmonary edema. - BNP elevated at 1,054.2. - Echo this admission showed LVEF of 45-50% with abnormal septal motion consistent with RV pacemaker and right volume overloaded. RV noted to be severely enlarged with severely reduced systolic function. See full report above. - Patient initially started on IV Lasix but was switched to PO Lasix 80mg  daily yesterday. After discussion with Dr. Audie Box and Dr. Domenic Polite (patient's primary Cardiologist), decision made to transition to Torsemide 40mg  twice daily. Documented urinary output of  2.6 L in the past 24 hours and net negative 6.8 since admission. Weight down 3lbs since yesterday. Creatinine stable. - Patient does not appear significantly volume overloaded on exam. JVD elevated but this is likely due to right heart failure with severe tricuspid regurgitation.  - Continue current dose of Torsemide. Continue potasium supplementation with K-Dur 40 mEq daily. - Continue Coreg 3.125mg  twice daily and Benazepril 40mg  daily. - Continue to monitor daily weights, strict I/O's, and renal function. - Newly reduced EF as well as more right heart failure felt to be related to severe mitral valve regurgitation and severe tricuspid regurgitation. See more on this below. - Can likely be discharged today. Patient already has follow-up visit with Dr. Domenic Polite scheduled for 05/05/2019. Will keep this. Will need repeat BMET at that time.  Mitral Regurgitation s/p Biopresthetic MVR in 2009 / Tricuspid Regurgitation - Echo this admission showed worsened MR as compared to  Echo in 2018. MV peak gradient of 21.0 mmHg. - Noted to have moderate to severe MR and severe TR on Echo this admission.  - Dr. Audie Box discussed with Dr. Domenic Polite (primary cardiologist). Patient expressed desire for more conservative approach with medical management at this time rather than aggressive evaluation of his valvular hear disease. Dr. Domenic Polite in agreement with this. - Continue Coumadin per Pharmacy.  Aortic Insufficiency s/p Bioprosthetic Aortic Valve in 2009 - Echo this admission showed no regurgitation by color flow doppler. AV mean gradient 21.0 mmHg and peak gradient 37.0 mmHg. Aortic valve area by VT 1.45cm^2. - Continue Coumadin per Pharmacy.  Paroxysmal Atrial Fibrillation/ Sick Sinus Syndrome s/p PPM in 2009 with Reinsertion in 2012 - Telemetry shows AV paced rhythm.  - [employer] rep interrogated device and had concerns about possible right atrial lead dysfunction but ventricular lead was sensing/pacing well.  Rep discussed this with Dr. Rayann Heman. DDI was adjusted to 75 bpm. No plans for any lead replacements given overall medical condition. - Continue Coreg 3.125mg  twice daily for rate control.  - Continue Amiodarone 100mg  daily for rhythm control.  - Continue chronic anticoagulation with Coumadin. INR 2.0 today. Dosing per pharmacy.  Hypertension - BP well controlled at 120/73.  - Continue current medications: Amlodipine 5mg  daily, Coreg 3.125mg  twice daily, Benazepril 40mg  daily.  CKD Stage III - Creatinine stable and slightly improved from yesterday. Creatinine 1.20 today. - Continue to monitor.  Chronic Anemia - Hemoglobin stable at 10.8. Recent baseline in 10 to 11 range. - Checking LDH and Haptoglobin given anemia to evaluate for possible hemolysis from valve. LDH slightly elevated. Haptoglobin still pending.  Chronic Thrombocytopenia - Platelets better today at 102 (up from 87 yesterday). Recent baseline in 90 to 120 range. - Continue to monitor.  Otherwise, per primary team.  ADDENDUM: Spoke with MD. Will change to Torsemide 40mg  daily with extra PRN dose at night as needed for weight gain, edema, shortness of breath.  For questions or updates, please contact [place] Please consult [email or web address] for contact info under        Signed, Darreld Mclean, PA-C  04/26/2019, 8:01 AM

## 2019-04-26 NOTE — Discharge Summary (Addendum)
Discharge Summary  [name] [id number] DOB: 07/31/1928  PCP: Eber Hong, MD  Admit date: 04/21/2019 Discharge date: 04/26/2019  Time spent: 35 minutes   Recommendations for Outpatient Follow-up:  1. Follow-up with your cardiologist 2. Follow-up with your PCP 3. Take your medications as prescribed 4. Continue physical therapy 5. Fall precautions  Cardiology recommendations as followed: Continue current dose of Torsemide. Continue potasium supplementation with K-Dur 40 mEq daily. Can likely be discharged today. Patient already has follow-up visit with Dr. Domenic Polite scheduled for 05/05/2019. Will keep this. Will need repeat BMET at that time.  Discharge Diagnoses:  Active Hospital Problems   Diagnosis Date Noted   Congestive heart failure (CHF) ([name]) 04/21/2019   OSA on CPAP 11/01/2009    Resolved Hospital Problems  No resolved problems to display.    Discharge Condition: Stable  Diet recommendation: Heart healthy low-salt diet.  Vitals:   04/26/19 0602 04/26/19 1318  BP: 120/73 122/70  Pulse: 75 74  Resp: 20 16  Temp: 98.7 F (37.1 C) 97.7 F (36.5 C)  SpO2: 95% 92%    History of present illness:   [name] a 85 y.o.malewith medical history significant ofbioprosthetic aortic valveand mitral valve repair, sick sinus syndrome with pacemaker, paroxysmal atrial fibrillation/atrial flutter on Coumadin, chronic diastolic CHF, obstructive sleep apnea on CPAP, stage III chronic kidney disease, iron deficiency anemia requiring IV iron transfusions,who presents with symptoms of increasing shortness of breath and lower extremity edema. Wife provides most of the history at bedside. She reports that for about 2 weeks patient has had continuous worsening lower extremity edema despite increasing his Lasix at home up to 80 mg a day. She also noticed progressive weeping edema of his lower extremity. They decided to finally come in at the recommendation of their  cardiologist since he was beginning to have shortness of breath whenambulating short distances inside his home. He denies any chest pain. Denies any new fever or chills. Denies any cough or runny nose. No nausea, vomiting or abdominal pain. Patient denies tobacco or illicit drug use. Patient has a small glass of wine occasionally.  ED Course:He is afebrile and was normotensive on room air. CBC showed leukopenia of 3.8 and hemoglobin of 10.5 which is around his baseline. Platelets visible over 88 from a prior normal of 127 about 3 months ago. BMP showed elevated creatinine of 1.31 from prior about 1. INR of 2.3. Chest x-ray shows central venous congestion and increased bilateral diffuse interstitial markings likely representing mild pulmonary edema. EKG shows dual paced rhythm any ST changes.  2D echo done on 04/22/2019 showed reduced LVEF 45-50% from 55-60% in 2018.    Seen by cardiology, received IV diuretics and diuresed well.  04/26/19: Patient was seen and examined at his bedside this morning.  No acute events overnight.  He has no new complaints.  He denies anginal symptoms.  Net I&O -6.5 L on 04/26/2019.  Home O2 evaluation completed patient will require 2 L oxygen per nasal cannula.  SATURATION QUALIFICATIONS: (This note is used to comply with regulatory documentation for home oxygen)  Patient Saturations on Room Air at Rest = 90%  Patient Saturations on Room Air while Ambulating = 82%  Patient Saturations on 2 Liters of oxygen while Ambulating = 95%  Riley Mason, RN  Registered Nurse    Hospital Course:  Active Problems:   OSA on CPAP   Congestive heart failure (CHF) (HCC)  Acute on chronic combined diastolic and systolic CHF Presented  with elevated BNP greater than thousand Independently viewed chest x-ray done on admission which showed cardiomegaly with increased pulmonary vascularity indicative of pulmonary edema.  Right AICD in place. Last 2D echo on  December 2018 revealed LVEF 55 to 60% Repeat 2D echo revealed LVEF 45 to 50% severely dilated left and right atrium Net I&O -6.5 L Follow-up with your cardiologist as stated above  Acute hypoxic respiratory failure likely secondary to acute on chronic combined diastolic and systolic CHF Will require oxygen supplementation as stated above Recommend pulmonary referral from your primary care provider  Valvular disease status post 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 On Coumadin INR 2.0 on 04/26/19 Follow-up with your cardiologist  Resolved hypokalemia post repletion Potassium 3.5 on 04/26/2019 Magnesium 2.1 on 04/26/2019  Paroxysmal A. fib On Coumadin Pharmacy managing Coumadin Rate controlled on Coreg Rhythm controlled on amiodarone  QTC prolongation Independently reviewed twelve-lead EKG done on admission which shows QTC 526. Continue to avoid QTC prolonging agents AICD in place  Hypothyroidism Continue levothyroxine  Resolved AKI Baseline creatinine appears to be 1.0 with GFR greater than 60 Presented with creatinine of 1.3 with GFR of 48 Creatinine 1.20 on 04/26/2019 with GFR 54 Follow-up with your PCP  OSA CPAP at night Recommend pulmonary follow-up; if not already have one, recommend referral from your primary care provider  Physical debility PT OT assessment recommended home health PT OT Continue physical therapy Fall precautions   Code Status: DNR    Discharge Exam: BP 122/70 (BP Location: Right Arm)    Pulse 74    Temp 97.7 F (36.5 C) (Oral)    Resp 16    Ht 5\' 6"  (1.676 m)    Wt 70.9 kg    SpO2 92%    BMI 25.23 kg/m   General: 85 y.o. year-old male well developed well nourished in no acute distress.  Alert and oriented x3.  Cardiovascular: Regular rate and rhythm with no rubs or gallops.  No thyromegaly.   Respiratory: Clear to auscultation with no wheezes or rales. Good inspiratory effort.  Abdomen: Soft nontender nondistended  with normal bowel sounds x4 quadrants.  Musculoskeletal: No lower extremity edema. 2/4 pulses in all 4 extremities.  Psychiatry: Mood is appropriate for condition and setting  Discharge Instructions You were cared for by a hospitalist during your hospital stay. If you have any questions about your discharge medications or the care you received while you were in the hospital after you are discharged, you can call the unit and asked to speak with the hospitalist on call if the hospitalist that took care of you is not available. Once you are discharged, your primary care physician will handle any further medical issues. Please note that NO REFILLS for any discharge medications will be authorized once you are discharged, as it is imperative that you return to your primary care physician (or establish a relationship with a primary care physician if you do not have one) for your aftercare needs so that they can reassess your need for medications and monitor your lab values.   Allergies as of 04/26/2019   No Known Allergies     Medication List    TAKE these medications   amiodarone 200 MG tablet Commonly known as: PACERONE TAKE 0.5 (1/2) TABLETS (100 MG TOTAL) BY MOUTH DAILY. What changed: See the new instructions.   amLODipine 5 MG tablet Commonly known as: NORVASC Take 5 mg by mouth every morning.   amoxicillin 500 MG tablet Commonly known as:  AMOXIL Take 2,000 mg by mouth See admin instructions. Take four capsules (2000 mg) by mouth one hour prior to dental appointments   benazepril 40 MG tablet Commonly known as: LOTENSIN TAKE 1 TABLET BY MOUTH EVERY DAY What changed: when to take this   carvedilol 3.125 MG tablet Commonly known as: COREG TAKE 1 TABLET (3.125 MG TOTAL) BY MOUTH 2 (TWO) TIMES DAILY. What changed: See the new instructions.   docusate sodium 100 MG capsule Commonly known as: COLACE Take 200-300 mg by mouth at bedtime.   levothyroxine 50 MCG tablet Commonly known  as: SYNTHROID Take 50 mcg by mouth daily before breakfast.   METAMUCIL PO Take 5 mLs by mouth every morning. Mix in orange juice   multivitamin with minerals Tabs tablet Take 0.5 tablets by mouth 2 (two) times daily.   nitroGLYCERIN 0.4 MG SL tablet Commonly known as: NITROSTAT Place 1 tablet (0.4 mg total) under the tongue every 5 (five) minutes as needed. Up to 3 doses, if no relief after 3rd dose, proceed to the ED for an evaluation. What changed:   reasons to take this  additional instructions   potassium chloride SA 20 MEQ tablet Commonly known as: [place]-Con M20 Take 2 tablets (40 mEq total) by mouth every morning. Start taking on: April 27, 2019 What changed:   how much to take  how to take this  when to take this  additional instructions   pravastatin 40 MG tablet Commonly known as: PRAVACHOL TAKE 1 TABLET BY MOUTH EVERY EVENING What changed: when to take this   [place] into the lungs at bedtime. CPAP   PROBIOTIC DAILY PO Take 1 tablet by mouth every morning.   torsemide 20 MG tablet Commonly known as: DEMADEX Take 2 tablets (40 mg total) by mouth daily. Start taking on: April 27, 2019   warfarin 2 MG tablet Commonly known as: COUMADIN Take 2-3 mg by mouth See admin instructions. Take 1 1/2 tablet (3 mg) by mouth on Sunday morning, take 1 tablet (2 mg) on all other mornings of the week            Durable Medical Equipment  (From admission, onward)         Start     Ordered   04/26/19 1300  For home use only DME oxygen  Once    Comments: Portable oxygen concentrator  Question Answer Comment  Length of Need 6 Months   Mode or (Route) Nasal cannula   Liters per Minute 2   Frequency Continuous (stationary and portable oxygen unit needed)   Oxygen conserving device Yes   Oxygen delivery system Gas      09 /30/20 1301         No Known Allergies Follow-up Information    [facility] Follow up.   Why:  Registered Nurse, Physical Therapy, Occupational Therapy-office to call you with visit times. Please call the office if you need to reach them for additional needs.  Contact information: [address]  [place] [address] [phone]       Satira Sark, MD Follow up.   Specialty: Cardiology Why: You already have a follow-up visit with Dr. Domenic Polite scheduled for 05/05/2019 at 3:00pm in our [place] office. This will be a good time for [place] to follow-up with you. Please arrive 15 minutes early for check-in. Contact information: [place] [address] [phone]        [facility] Follow up.  Why: [place] to set up in hospital. If you need anything call the above number and they can connect you with the [place] office.  Contact information: [employer] [place] [address] [phone]        Eber Hong, MD. Call in 1 day(s).   Specialty: Internal Medicine Why: Please call for a post hospital follow-up appointment. Contact information: [place] [address] [phone]        Satira Sark, MD .   Specialty: Cardiology Contact information: [place] [address] [phone]        Thompson Grayer, MD .   Specialty: Cardiology Contact information: [place] [address] [phone]            The results of significant diagnostics from this hospitalization (including imaging, microbiology, ancillary and laboratory) are listed below for reference.    Significant Diagnostic Studies: Dg Chest 2 View  Result Date: 04/21/2019 CLINICAL DATA:  increased leg swelling, changes made to his diuretics but wife reports legs started to weep last night. Pt also having some SOB, no dyspnea noted. EXAM: CHEST - 2 VIEW COMPARISON:  Chest radiograph 12 03/15/2017, 08/02/2015 FINDINGS: Stable cardiomediastinal contours with enlarged heart size status post median  sternotomy and CABG. Aortic arch calcification. Central venous congestion. Stable left chest ICD. Diffusely increased interstitial markings likely representing mild pulmonary edema. No pneumothorax. Trace right pleural fluid. No acute finding in the visualized skeleton. IMPRESSION: Central venous congestion and increased bilateral diffuse interstitial markings likely mild pulmonary edema. Electronically Signed   By: Audie Pinto M.D.   On: 04/21/2019 16:28    Microbiology: Recent Results (from the past 240 hour(s))  SARS CORONAVIRUS 2 (TAT 6-24 HRS) Nasopharyngeal Nasopharyngeal Swab     Status: None   Collection Time: 04/21/19  6:45 PM   Specimen: Nasopharyngeal Swab  Result Value Ref Range Status   SARS Coronavirus 2 NEGATIVE NEGATIVE Final    Comment: (NOTE) SARS-CoV-2 target nucleic acids are NOT DETECTED. The SARS-CoV-2 RNA is generally detectable in upper and lower respiratory specimens during the acute phase of infection. Negative results do not preclude SARS-CoV-2 infection, do not rule out co-infections with other pathogens, and should not be used as the sole basis for treatment or other patient management decisions. Negative results must be combined with clinical observations, patient history, and epidemiological information. The expected result is Negative. Fact Sheet for Patients: SugarRoll.be Fact Sheet for Healthcare Providers: https://www.woods-mathews.com/ This test is not yet approved or cleared by the [place] FDA and  has been authorized for detection and/or diagnosis of SARS-CoV-2 by FDA under an Emergency Use Authorization (EUA). This EUA will remain  in effect (meaning this test can be used) for the duration of the COVID-19 declaration under Section 56 4(b)(1) of the Act, 21 U.S.C. section 360bbb-3(b)(1), unless the authorization is terminated or revoked sooner. Performed at [facility], [place] [address], [place], [place] [address]      Labs: Basic Metabolic Panel: Recent Labs  Lab 04/22/19 0102 04/23/19 0503 04/24/19 1002 04/25/19 0352 04/26/19 0337  NA 142 139 138 140 141  K 3.2* 3.9 3.6 3.8 3.5  CL 106 106 102 105 104  CO2 27 26 26 26 26   GLUCOSE 103* 105* 181* 84 88  BUN 21 24* 24* 25* 29*  CREATININE 1.17 1.20 1.27* 1.24 1.20  CALCIUM 8.9 8.9 8.8* 8.9 9.0  MG  --  2.5*  --   --  2.1  PHOS  --   --   --   --  3.5   Liver Function Tests: No results for input(s): AST, ALT, ALKPHOS, BILITOT, PROT, ALBUMIN in the last 168 hours. No results for input(s): LIPASE, AMYLASE in the last 168 hours. No results for input(s): AMMONIA in the last 168 hours. CBC: Recent Labs  Lab 04/21/19 1611 04/22/19 0102 04/24/19 1002 04/25/19 0352 04/26/19 0337  WBC 3.8* 4.4 5.4 4.8 5.3  HGB 10.5* 10.6* 10.4* 10.2* 10.8*  HCT 33.0* 32.5*   33.3* 32.3* 31.0* 33.1*  MCV 88.7 86.7 87.5 87.1 87.1  PLT 88* 93* 92* 87* 102*   Cardiac Enzymes: No results for input(s): CKTOTAL, CKMB, CKMBINDEX, TROPONINI in the last 168 hours. BNP: BNP (last 3 results) Recent Labs    04/21/19 2233  BNP 1,054.2*    ProBNP (last 3 results) No results for input(s): PROBNP in the last 8760 hours.  CBG: No results for input(s): GLUCAP in the last 168 hours.     Signed:  Kayleen Memos, MD [facility] 04/26/2019, 2:01 PM

## 2019-04-26 NOTE — Plan of Care (Signed)
  Problem: Clinical Measurements: Goal: Respiratory complications will improve Outcome: Progressing Note: No s/s of respiratory complications noted. Goal: Cardiovascular complication will be avoided Outcome: Progressing Note: No s/s of cardiovascular complication noted.   

## 2019-04-26 NOTE — Progress Notes (Signed)
Occupational Therapy Treatment Patient Details Name: [name] MRN: [id number] DOB: [birth year] Today's Date: 04/26/2019    History of present illness 85 y.o. 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 In ED Chest x-ray shows central venous congestion and increased bilateral diffuse interstitial markings likely representing mild pulmonary edema. Admitted 04/21/19 for treatment of CHF   OT comments  Pt progressing toward established OT goals. Session focused on energy conservation education with provided handout and education on safe and appropriate use of supplemental O2. Educated pt and his wife on importance of maintaining supplemental O2, use of energy conservation strategies and pursed lip breathing. Anticipate d/c this date, pt's current level of functioning adequate for d/c home with wife and continued [place] services. Should pt remain in acute care, will continue to follow.    Follow Up Recommendations  Home health OT;Supervision - Intermittent;Other (comment)(with mobility)    Equipment Recommendations  None recommended by OT    Recommendations for Other Services      Precautions / Restrictions Precautions Precautions: Fall Restrictions Weight Bearing Restrictions: No       Mobility Bed Mobility Overal bed mobility: Needs Assistance Bed Mobility: Supine to Sit     Supine to sit: Supervision Sit to supine: Supervision   General bed mobility comments: supervision for safety, increased time and effort required  Transfers Overall transfer level: Needs assistance Equipment used: Rolling walker (2 wheeled) Transfers: Sit  to/from Stand Sit to Stand: Min guard         General transfer comment: min guard for safety, increased time and effort to power up, able to come to upright before reaching to RW    Balance Overall balance assessment: Needs assistance Sitting-balance support: Feet supported;No upper extremity supported Sitting balance-Leahy Scale: Good     Standing balance support: No upper extremity supported;During functional activity Standing balance-Leahy Scale: Fair                             ADL either performed or assessed with clinical judgement   ADL Overall ADL's : Needs assistance/impaired                                       General ADL Comments: provided handout on energy conservation and discussed strategies with pt and his wife;educated pt on proper use of supplemental O2 and decreasing risk for falls;SpO2 95% at rest on 2lnc with pursed lip breathing pt able to increase O2 to 96%, educated pt on importance of pursed lip breathing and possible purchase of pulse ox.      Vision       Perception     Praxis      Cognition Arousal/Alertness: Awake/alert Behavior During Therapy: WFL for tasks assessed/performed Overall Cognitive Status: Within Functional Limits for tasks assessed                                          Exercises     Shoulder Instructions  General Comments Pt requires 2L O2 via [place] to maintain SaO2 >90%O2 with ambulation. vss    Pertinent Vitals/ Pain       Pain Assessment: No/denies pain  Home Living                                          Prior Functioning/Environment              Frequency  Min 2X/week        Progress Toward Goals  OT Goals(current goals can now be found in the care plan section)  Progress towards OT goals: Progressing toward goals  Acute Rehab OT Goals Patient Stated Goal: go home today OT Goal Formulation: With patient Time For Goal  Achievement: 05/08/19 Potential to Achieve Goals: Good ADL Goals Pt Will Perform Grooming: with modified independence;standing;sitting Pt Will Perform Upper Body Dressing: with modified independence;sitting;standing Pt Will Perform Lower Body Dressing: with modified independence;sit to/from stand Pt Will Transfer to Toilet: with modified independence;ambulating Pt Will Perform Tub/Shower Transfer: with modified independence Additional ADL Goal #1: Pt will independently demonstrate use of 3 energy conservation strategies during ADL completion.  Plan Discharge plan remains appropriate(Pt's current level of functioning appropriate of d/c)    Co-evaluation                 AM-PAC OT "6 Clicks" Daily Activity     Outcome Measure   Help from another person eating meals?: A Little Help from another person taking care of personal grooming?: A Little Help from another person toileting, which includes using toliet, bedpan, or urinal?: A Little Help from another person bathing (including washing, rinsing, drying)?: A Little Help from another person to put on and taking off regular upper body clothing?: A Little Help from another person to put on and taking off regular lower body clothing?: A Little 6 Click Score: 18    End of Session Equipment Utilized During Treatment: Oxygen  OT Visit Diagnosis: Unsteadiness on feet (R26.81);Other abnormalities of gait and mobility (R26.89)   Activity Tolerance Patient tolerated treatment well   Patient Left in bed;with call bell/phone within reach;with bed alarm set   Nurse Communication Mobility status        Time: [id number] OT Time Calculation (min): 25 min  Charges: OT General Charges $OT Visit: 1 Visit OT Treatments $Self Care/Home Management : 23-37 mins  [place] Office: [place] 04/26/2019, 2:39 PM

## 2019-04-26 NOTE — Progress Notes (Signed)
      SATURATION QUALIFICATIONS: (This note is used to comply with regulatory documentation for home oxygen)  Patient Saturations on Room Air at Rest = 90%  Patient Saturations on Room Air while Ambulating = 82%  Patient Saturations on 2 Liters of oxygen while Ambulating = 95%  Please briefly explain why patient needs home oxygen:  Pt is unable to maintain oxygen saturation greater than 90%O2 without 2L supplemental O2 via [place].   Latroya Ng B. Migdalia Dk PT, DPT Acute Rehabilitation Services Pager [phone] Office [phone]

## 2019-04-26 NOTE — Progress Notes (Signed)
ANTICOAGULATION CONSULT NOTE - Follow-up Consult  Pharmacy Consult for Warfarin Indication: atrial fibrillation  No Known Allergies  Patient Measurements: Height: 5\' 6"  (167.6 cm) Weight: 156 lb 4.9 oz (70.9 kg) IBW/kg (Calculated) : 63.8   Vital Signs: Temp: 98.7 F (37.1 C) (09/30 0602) Temp Source: Oral (09/30 0602) BP: 120/73 (09/30 0602) Pulse Rate: 75 (09/30 0602)  Labs: Recent Labs    04/24/19 1002 04/25/19 0352 04/26/19 0337  HGB 10.4* 10.2* 10.8*  HCT 32.3* 31.0* 33.1*  PLT 92* 87* 102*  LABPROT 22.8* 23.1* 22.3*  INR 2.1* 2.1* 2.0*  CREATININE 1.27* 1.24 1.20    Estimated Creatinine Clearance: 38.4 mL/min (by C-G formula based on SCr of 1.2 mg/dL).     Assessment: 85 y.o. presenting with leg swelling. PMH significant for CHF, CAD, HTN, OSA, bioprosthetic valve and mitral valve repair, and Atrial Flutter on Warfarin PTA - 3 mg on Sundays and 2 mg all other days (Last dose 9/25 @10AM ). Admission INR therapeutic at 2.3. Pharmacy consulted for warfarin dosing. She is on amiodarone PTA.   INR today is therapeutic at 2, pt potentially being discharged today.  Goal of Therapy:  INR 2-3 Monitor platelets by anticoagulation protocol: Yes   Plan:  -Warfarin 2mg  PO x1 tonight -If discharging home - recommend resuming home dose of 2mg  daily except 3mg  Sunday   Arrie Senate, PharmD, BCPS Clinical Pharmacist [phone] Please check AMION for all [place] numbers 04/26/2019

## 2019-04-26 NOTE — Progress Notes (Signed)
Physical Therapy Treatment Patient Details Name: [name] MRN: [id number] DOB: September 26, 1928 Today's Date: 04/26/2019    History of Present Illness 85 y.o. 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 In ED Chest x-ray shows central venous congestion and increased bilateral diffuse interstitial markings likely representing mild pulmonary edema. Admitted 04/21/19 for treatment of CHF    PT Comments    Pt agreeable to ambulation with therapy to determine whether he will need home oxygen. Pt SaO2 is 90%O2 on RA, however with ambulation drops to 82%O2. Pt able to maintain SaO2>95%O2 on 2L O2 via [place]. Pt is supervision for bed mobility, and min guard for transfers and ambulation of 120 feet with RW. Pt is hopeful for d/c possibly this afternoon. D/c plans remain appropriate.    Follow Up Recommendations  Home health PT     Equipment Recommendations  None recommended by PT       Precautions / Restrictions Precautions Precautions: Fall Restrictions Weight Bearing Restrictions: No    Mobility  Bed Mobility Overal bed mobility: Needs Assistance Bed Mobility: Supine to Sit     Supine to sit: Supervision Sit to supine: Supervision   General bed mobility comments: supervision for safety, increased time and effort required  Transfers Overall transfer level: Needs assistance Equipment used: Rolling walker (2 wheeled) Transfers: Sit to/from Stand Sit to Stand: Min guard         General transfer comment: min guard for safety, increased time and effort to power up, able to come to upright before reaching to  RW  Ambulation/Gait Ambulation/Gait assistance: Min guard Gait Distance (Feet): 120 Feet Assistive device: Rolling walker (2 wheeled) Gait Pattern/deviations: Step-through pattern;Decreased stance time - left;Decreased weight shift to left;Trunk flexed Gait velocity: slowed Gait velocity interpretation: <1.31 ft/sec, indicative of household ambulator General Gait Details: gait pattern dominated by L length decrepancy, with use of L toes and increased forward flexion over RW. overall slow steady gait with no LoB     Balance Overall balance assessment: Needs assistance Sitting-balance support: Feet supported;No upper extremity supported Sitting balance-Leahy Scale: Good     Standing balance support: No upper extremity supported;During functional activity Standing balance-Leahy Scale: Fair                              Cognition Arousal/Alertness: Awake/alert Behavior During Therapy: WFL for tasks assessed/performed Overall Cognitive Status: Within Functional Limits for tasks assessed                                           General Comments General comments (skin integrity, edema, etc.): Pt requires 2L O2 via [place] to maintain SaO2 >90%O2 with ambulation. vss      Pertinent Vitals/Pain Pain Assessment: No/denies pain           PT Goals (current goals can now be found in the care plan section) Acute Rehab PT Goals Patient Stated Goal: go home today PT Goal Formulation: With patient Time For Goal Achievement: 05/08/19 Potential to Achieve Goals: Good Progress towards PT goals: Progressing toward goals  Frequency    Min 3X/week      PT Plan Current plan remains appropriate       AM-PAC PT "6 Clicks" Mobility   Outcome Measure  Help needed turning from your back to your side while in a flat bed without using bedrails?: None Help needed moving from lying on your back to sitting on the side of a flat bed without using bedrails?:  None Help needed moving to and from a bed to a chair (including a wheelchair)?: None Help needed standing up from a chair using your arms (e.g., wheelchair or bedside chair)?: None Help needed to walk in hospital room?: None Help needed climbing 3-5 steps with a railing? : A Little 6 Click Score: 23    End of Session Equipment Utilized During Treatment: Gait belt;Oxygen Activity Tolerance: Patient tolerated treatment well Patient left: in chair;with call bell/phone within reach;with chair alarm set Nurse Communication: Mobility status PT Visit Diagnosis: Other abnormalities of gait and mobility (R26.89);Muscle weakness (generalized) (M62.81)     Time: [id number] PT Time Calculation (min) (ACUTE ONLY): 19 min  Charges:  $Gait Training: 8-22 mins                     Enio Hornback B. Migdalia Dk PT, DPT Acute Rehabilitation Services Pager [phone] Office [phone]    [place] 04/26/2019, 11:33 AM

## 2019-04-26 NOTE — TOC Progression Note (Signed)
Transition of Care [facility]) - Progression Note    Patient Details  Name: ARASH HEAP MRN: [id number] Date of Birth: July 24, 1929  [facility] of [facility] CM/SW Contact  Graves-Bigelow, Ocie Cornfield, RN Phone Number: 04/26/2019, 12:26 PM  Clinical Narrative: Patient has CPAP via [place] and wants to use them for Oxygen. Referral sent to [facility] and will deliver portable tank to room and set up concentrator for the home. MD aware that orders need to be placed for DME 02. No further needs from CM at this time.    Expected Discharge Plan: [place] Barriers to Discharge: No Barriers Identified  Expected Discharge Plan and Services Expected Discharge Plan: [place] In-house Referral: NA Discharge Planning Services: CM Consult Post Acute Care Choice: Durable Medical Equipment Living arrangements for the past 2 months: Single Family Home                 DME Arranged: Oxygen DME Agency: [facility] Date DME Agency Contacted: 04/26/19 Time DME Agency Contacted: 53 Representative spoke with at DME Agency: Caryl Pina HH Arranged: RN, Disease Management, PT, OT [place] Agency: [place]) Date [facility] Agency Contacted: 04/25/19 Time [place]: 55 Representative spoke with at [place]: [place] ([place]) Interventions    Readmission Risk Interventions Readmission Risk Prevention Plan 04/25/2019  Transportation Screening Complete  Home Care Screening Complete  Medication Review (RN CM) Complete  Some recent data might be hidden

## 2019-04-26 NOTE — Progress Notes (Signed)
SATURATION QUALIFICATIONS: (This note is used to comply with regulatory documentation for home oxygen)  Patient Saturations on Room Air at Rest = 90%  Patient Saturations on Room Air while Ambulating = 82%  Patient Saturations on 2 Liters of oxygen while Ambulating = 95%  Please briefly explain why patient needs home oxygen:

## 2019-05-05 ENCOUNTER — Other Ambulatory Visit: Payer: Self-pay

## 2019-05-05 ENCOUNTER — Encounter: Payer: Self-pay | Admitting: Cardiology

## 2019-05-05 ENCOUNTER — Ambulatory Visit (INDEPENDENT_AMBULATORY_CARE_PROVIDER_SITE_OTHER): Payer: Medicare Other | Admitting: Cardiology

## 2019-05-05 VITALS — BP 104/54 | HR 75 | Temp 98.3°F | Ht 66.0 in | Wt 152.6 lb

## 2019-05-05 DIAGNOSIS — I5042 Chronic combined systolic (congestive) and diastolic (congestive) heart failure: Secondary | ICD-10-CM

## 2019-05-05 DIAGNOSIS — I5032 Chronic diastolic (congestive) heart failure: Secondary | ICD-10-CM

## 2019-05-05 DIAGNOSIS — I38 Endocarditis, valve unspecified: Secondary | ICD-10-CM | POA: Diagnosis not present

## 2019-05-05 DIAGNOSIS — I25119 Atherosclerotic heart disease of native coronary artery with unspecified angina pectoris: Secondary | ICD-10-CM | POA: Diagnosis not present

## 2019-05-05 DIAGNOSIS — I480 Paroxysmal atrial fibrillation: Secondary | ICD-10-CM

## 2019-05-05 NOTE — Progress Notes (Signed)
Cardiology Office Note  Date: 05/05/2019   ID: Deavon, Diebel 08-14-1928, MRN [id number]  PCP:  Eber Hong, MD  Cardiologist:  Rozann Lesches, MD Electrophysiologist:  Thompson Grayer, MD   Chief Complaint  Patient presents with   Hospitalization Follow-up    History of Present Illness: [name] is an 85 y.o. male last seen in December 2019. Hospitalized with acute on chronic combined heart failure associated with worsening shortness of breath and leg swelling with weeping.  Proved with IV diuresis and was ultimately switched to oral Demadex.  Follow-up echocardiogram did show decrease in LVEF to the range of 45 to 50% with severe mitral regurgitation and severe tricuspid regurgitation as well as RV dysfunction.  He presents today with his wife for follow-up.  He states that he has done much better so far on Demadex.  Weight has been in the high 140s unclothed in the mornings at home.  His weight today was fully clothed and still less than it had been in late September.  I reviewed his medications and recent lab work.  We talked about parameters for which she would take an additional Demadex tablet.  I also reviewed the results of his echocardiogram with plan to continue conservative management.  He would not be a good candidate for valve surgery at this point.  Past Medical History:  Diagnosis Date   Anemia    Arthritis    Atrial flutter (HCC)    Amiodarone therapy, coumadin therapy, prior history of cardioversion and overdrive pacing   Barrett's esophagus    Benign prostatic hyperplasia    Cardiomyopathy    Mixed ischemic and nonischemic with previous EF of 35-40%, most recently in June 2009 normalized to 55-60%. Echo 2/11 EF 55-60%   Carotid artery disease (HCC)    Less than 50% bilateral internal carotid artery stenosis by Doppler 2009   Carpal tunnel syndrome    Chronic diastolic (congestive) heart failure (HCC)    Coronary atherosclerosis of native  coronary artery    Status post coronary bypass grafting   Endocarditis    s/p PPM extraction by Dr Lovena Le 2012 endocarditis with enterococcus faecalis   Endocarditis    Essential hypertension, benign    Ganglion cyst    right wrist   GERD (gastroesophageal reflux disease)    PMH   Hiatal hernia    HOH (hard of hearing)    Hyperlipidemia    Hypothyroidism    Inguinal hernia    Left   Internal hemorrhoids    Iron deficiency anemia    Obstructive sleep apnea    Osteoporosis    Paroxysmal atrial fibrillation (HCC)    Amiodarone therapy, status post Cox-Maze procedure but with recurrent atrial fibrillation while on amiodarone August 2012   Presence of permanent cardiac pacemaker    Symptomatic bradycardia    Tubular adenoma of colon    Valvular heart disease    Status post 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   Warfarin anticoagulation    Wears glasses    Wears partial dentures     Past Surgical History:  Procedure Laterality Date   AORTIC VALVE REPLACEMENT     CARPAL TUNNEL RELEASE  2009   CATARACT EXTRACTION, BILATERAL  07/2011   COLONOSCOPY W/ BIOPSIES AND POLYPECTOMY     CORONARY ARTERY BYPASS GRAFT  2008   x2   HERNIA REPAIR  age 25   INGUINAL HERNIA REPAIR Left 08/30/2017   Procedure: LEFT INGUINAL HERNIA REPAIR;  Surgeon: Rolm Bookbinder, MD;  Location: [place];  Service: General;  Laterality: Left;  GENERAL ETT AND TAP BLOCK   INSERTION OF MESH Left 08/30/2017   Procedure: INSERTION OF MESH;  Surgeon: Rolm Bookbinder, MD;  Location: [place];  Service: General;  Laterality: Left;  GENERAL ETT AND TAP BLOCK   KNEE ARTHROSCOPY     MASS EXCISION Right 01/26/2018   Procedure: Right wrist dorsal mass excision;  Surgeon: Iran Planas, MD;  Location: [place];  Service: Orthopedics;  Laterality: Right;  60 mins   PACEMAKER INSERTION  2009   [facility] implanted at [facility], extracted by Dr Lovena Le for endocarditis and reimplanted 8/12    PATELLA FRACTURE SURGERY  age 6   screw placed and removed   TEE WITHOUT CARDIOVERSION N/A 07/05/2017   Procedure: TRANSESOPHAGEAL ECHOCARDIOGRAM (TEE);  Surgeon: Sanda Klein, MD;  Location: [facility];  Service: Cardiovascular;  Laterality: N/A;   TONSILLECTOMY  1939   TOTAL HIP ARTHROPLASTY      Current Outpatient Medications  Medication Sig Dispense Refill   amiodarone (PACERONE) 200 MG tablet TAKE 0.5 (1/2) TABLETS (100 MG TOTAL) BY MOUTH DAILY. (Patient taking differently: Take 100 mg by mouth every morning. ) 45 tablet 3   amLODipine (NORVASC) 5 MG tablet Take 5 mg by mouth every morning.   3   amoxicillin (AMOXIL) 500 MG tablet Take 2,000 mg by mouth See admin instructions. Take four capsules (2000 mg) by mouth one hour prior to dental appointments     benazepril (LOTENSIN) 40 MG tablet TAKE 1 TABLET BY MOUTH EVERY DAY (Patient taking differently: Take 40 mg by mouth every morning. ) 90 tablet 3   carvedilol (COREG) 3.125 MG tablet TAKE 1 TABLET (3.125 MG TOTAL) BY MOUTH 2 (TWO) TIMES DAILY. (Patient taking differently: Take 3.125 mg by mouth 2 (two) times daily with a meal. ) 180 tablet 3   docusate sodium (COLACE) 100 MG capsule Take 200-300 mg by mouth at bedtime.      levothyroxine (SYNTHROID, LEVOTHROID) 50 MCG tablet Take 50 mcg by mouth daily before breakfast.     Multiple Vitamin (MULTIVITAMIN WITH MINERALS) TABS tablet Take 0.5 tablets by mouth 2 (two) times daily.     nitroGLYCERIN (NITROSTAT) 0.4 MG SL tablet Place 1 tablet (0.4 mg total) under the tongue every 5 (five) minutes as needed. Up to 3 doses, if no relief after 3rd dose, proceed to the ED for an evaluation. (Patient taking differently: Place 0.4 mg under the tongue every 5 (five) minutes as needed for chest pain (Up to 3 doses, if no relief after 3rd dose, proceed to the ED for an evaluation.). ) 25 tablet 3   potassium chloride SA (KLOR-CON M20) 20 MEQ tablet Take 2 tablets (40 mEq total) by mouth  every morning. 60 tablet 0   pravastatin (PRAVACHOL) 40 MG tablet TAKE 1 TABLET BY MOUTH EVERY EVENING (Patient taking differently: Take 40 mg by mouth daily with supper. ) 90 tablet 3   Probiotic Product (PROBIOTIC DAILY PO) Take 1 tablet by mouth every morning.      Psyllium (METAMUCIL PO) Take 5 mLs by mouth every morning. Mix in orange juice     torsemide (DEMADEX) 20 MG tablet Take 2 tablets (40 mg total) by mouth daily. 60 tablet 0   warfarin (COUMADIN) 2 MG tablet Take 2-3 mg by mouth See admin instructions. Take 1 1/2 tablet (3 mg) by mouth on Sunday morning, take 1 tablet (2 mg) on all other mornings  of the week     No current facility-administered medications for this visit.    Allergies:  Patient has no known allergies.   Social History: The patient  reports that he quit smoking about 64 years ago. His smoking use included cigarettes. He started smoking about 75 years ago. He has a 2.40 pack-year smoking history. He has never used smokeless tobacco. He reports current alcohol use. He reports that he does not use drugs.   ROS:  Please see the history of present illness. Otherwise, complete review of systems is positive for hearing loss, urinary hesitancy.  All other systems are reviewed and negative.   Physical Exam: VS:  BP (!) 104/54    Pulse 75    Temp 98.3 F (36.8 C)    Ht 5\' 6"  (1.676 m)    Wt 152 lb 9.6 oz (69.2 kg)    SpO2 100% Comment: 2 L/min 24/7   BMI 24.63 kg/m , BMI Body mass index is 24.63 kg/m.  Wt Readings from Last 3 Encounters:  05/05/19 152 lb 9.6 oz (69.2 kg)  04/26/19 156 lb 4.9 oz (70.9 kg)  12/27/18 167 lb (75.8 kg)    General: Elderly male, using a rolling walker.  Also on oxygen via nasal cannula. HEENT: Conjunctiva and lids normal, wearing a mask. Neck: Supple, no elevated JVP or carotid bruits, no thyromegaly. Lungs: Decreased breath sounds without wheezing, nonlabored breathing at rest. Cardiac: Regular rate and rhythm, no S3 [id number] apical  systolic murmur, no pericardial rub. Abdomen: Soft, nontender, bowel sounds present. Extremities: Mild lower leg edema with compression stockings in place. Skin: Warm and dry. Musculoskeletal: No kyphosis. Neuropsychiatric: Alert and oriented x3, affect grossly appropriate.  ECG:  An ECG dated 04/25/2019 was personally reviewed today and demonstrated:  Dual-chamber pacing.  Recent Labwork: 12/27/2018: ALT 22; AST 32 04/21/2019: B Natriuretic Peptide 1,054.2 04/26/2019: BUN 29; Creatinine, Ser 1.20; Hemoglobin 10.8; Magnesium 2.1; Platelets 102; Potassium 3.5; Sodium 141   Other Studies Reviewed Today:  Echocardiogram 04/22/2019:  1. Left ventricular ejection fraction, by visual estimation, is 45 to 50%. The left ventricle has normal function. Normal left ventricular size. There is no left ventricular hypertrophy.  2. Abnormal septal motion consistent with RV pacemaker and right ventricular volume overload.  3. Left ventricular diastolic function could not be evaluated pattern of LV diastolic filling.  4. Global right ventricle has severely reduced systolic function.The right ventricular size is severely enlarged. No increase in right ventricular wall thickness.  5. Left atrial size was severely dilated.  6. Right atrial size was severely dilated.  7. The mitral valve has been repaired/replaced. Moderate to severe mitral valve regurgitation. Mild mitral stenosis.  8. The tricuspid valve is grossly normal. Tricuspid valve regurgitation severe.  9. Aortic valve regurgitation was not visualized by color flow Doppler. 10. The pulmonic valve was grossly normal. Pulmonic valve regurgitation is trivial by color flow Doppler. 11. Moderately elevated pulmonary artery systolic pressure. 12. A pacer wire is visualized. 13. The inferior vena cava is dilated in size with <50% respiratory variability, suggesting right atrial pressure of 15 mmHg. 60. Compared to the 2018 study, mitral regurgitation has  worsened and the AV prosthesis gradients have improved (possibly a function of lower cardiac output; the patient was anemic in 2018). Although the LVEF was reported higher in 2018, the side-by-side  comparison shows similar LV contractility.  Assessment and Plan:  1.  Chronic combined heart failure, recent LVEF 45 to 50% range and also severe RV  dysfunction in the setting of valvular heart disease.  Plan is to continue conservative management, he is tolerating switch from Lasix to [facility] well so far.  2.  Valvular heart disease with history of bioprosthetic AVR and mitral valve repair.  He has shown evidence of progressive mitral and tricuspid regurgitation by recent study and is not a good candidate for repeat valve surgery.  I discussed this with the patient and his wife today.  3.  CAD status post CABG.  No active angina symptoms.  Continue statin therapy.  4.  Sick sinus syndrome with [employer] pacemaker in place.  He follows with Dr. Emelia Loron.  5.  Paroxysmal atrial fibrillation.  Clinically stable on amiodarone and Coumadin.  Medication Adjustments/Labs and Tests Ordered: Current medicines are reviewed at length with the patient today.  Concerns regarding medicines are outlined above.   Tests Ordered: Orders Placed This Encounter  Procedures   Basic metabolic panel    Medication Changes: No orders of the defined types were placed in this encounter.   Disposition:  Follow up 6 weeks in the [place] office.  Signed, Satira Sark, MD, [facility] 05/05/2019 3:30 PM    [facility] [place], [place], [place] [address] Phone: [phone]; Fax: [phone]

## 2019-05-05 NOTE — Patient Instructions (Addendum)
Medication Instructions:   Your physician recommends that you continue on your current medications as directed. Please refer to the Current Medication list given to you today.  Labwork:  Your physician recommends that you return for non-fasting lab work in: 6 weeks just before your next visit with Dr. Domenic Polite.  Testing/Procedures:  NONE  Follow-Up:  Your physician recommends that you schedule a follow-up appointment in: 6 weeks.  Any Other Special Instructions Will Be Listed Below (If Applicable).  If you need a refill on your cardiac medications before your next appointment, please call your pharmacy.

## 2019-05-19 ENCOUNTER — Telehealth: Payer: Self-pay | Admitting: Cardiology

## 2019-05-19 MED ORDER — TORSEMIDE 20 MG PO TABS: 40.0000 mg | ORAL_TABLET | Freq: Every day | ORAL | 6 refills | Status: DC

## 2019-05-19 NOTE — Telephone Encounter (Signed)
°*  STAT* If patient is at the pharmacy, call can be transferred to refill team.   1. Which medications need to be refilled? torsemide (DEMADEX) 20 MG tablet   2. Which pharmacy/location (including street and city if local pharmacy) is medication to be sent to? [facility]  [address]  3. Do they need a 30 day or 90 day supply?

## 2019-05-19 NOTE — Telephone Encounter (Signed)
Done

## 2019-06-01 ENCOUNTER — Other Ambulatory Visit: Payer: Self-pay | Admitting: Cardiology

## 2019-06-02 ENCOUNTER — Encounter: Payer: Self-pay | Admitting: Internal Medicine

## 2019-06-02 ENCOUNTER — Telehealth (INDEPENDENT_AMBULATORY_CARE_PROVIDER_SITE_OTHER): Payer: Medicare Other | Admitting: Internal Medicine

## 2019-06-02 VITALS — Ht 66.0 in | Wt 149.0 lb

## 2019-06-02 DIAGNOSIS — I495 Sick sinus syndrome: Secondary | ICD-10-CM

## 2019-06-02 DIAGNOSIS — I480 Paroxysmal atrial fibrillation: Secondary | ICD-10-CM | POA: Diagnosis not present

## 2019-06-02 DIAGNOSIS — I10 Essential (primary) hypertension: Secondary | ICD-10-CM | POA: Diagnosis not present

## 2019-06-02 DIAGNOSIS — I25119 Atherosclerotic heart disease of native coronary artery with unspecified angina pectoris: Secondary | ICD-10-CM | POA: Diagnosis not present

## 2019-06-02 DIAGNOSIS — I442 Atrioventricular block, complete: Secondary | ICD-10-CM

## 2019-06-02 DIAGNOSIS — I38 Endocarditis, valve unspecified: Secondary | ICD-10-CM

## 2019-06-02 NOTE — Patient Instructions (Signed)
Medication Instructions:  Continue all current medications.  Labwork: none  Testing/Procedures: none  Follow-Up: 1 year   Any Other Special Instructions Will Be Listed Below (If Applicable).  If you need a refill on your cardiac medications before your next appointment, please call your pharmacy.  

## 2019-06-02 NOTE — Progress Notes (Signed)
**Note Riley-Identified via Obfuscation** Electrophysiology TeleHealth Note   Due to national recommendations of social distancing due to [place] 19, an audio telehealth visit is felt to be most appropriate for this patient at this time.  Verbal consent was obtained by me for the telehealth visit today.  The patient does not have capability for a virtual visit.  A phone visit is therefore required today.   Date:  06/02/2019   ID:  [name], DOB January 16, 1929, MRN [id number]  Location: patient's home  Provider location:  [facility]  Evaluation Performed: Follow-up visit  PCP:  Eber Hong, MD   Electrophysiologist:  Dr Rayann Heman  Chief Complaint:  Follow up  History of Present Illness:    [name] is a 85 y.o. male who presents via telehealth conferencing today.  Since last being seen in our clinic, the patient reports doing relatively well.  He is now on home O2.   Today, he denies symptoms of palpitations, chest pain, shortness of breath,  lower extremity edema, dizziness, presyncope, or syncope.  The patient is otherwise without complaint today.  The patient denies symptoms of fevers, chills, cough, or new SOB worrisome for COVID 19.  Past Medical History:  Diagnosis Date  . Anemia   . Arthritis   . Atrial flutter (HCC)    Amiodarone therapy, coumadin therapy, prior history of cardioversion and overdrive pacing  . Barrett's esophagus   . Benign prostatic hyperplasia   . Cardiomyopathy    Mixed ischemic and nonischemic with previous EF of 35-40%, most recently in June 2009 normalized to 55-60%. Echo 2/11 EF 55-60%  . Carotid artery disease (HCC)    Less than 50% bilateral internal carotid artery stenosis by Doppler 2009  . Carpal tunnel syndrome   . Chronic diastolic (congestive) heart failure ([place])   . Coronary atherosclerosis of native coronary artery    Status post coronary bypass grafting  . 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 Endocarditis   .  Essential hypertension, benign   . Ganglion cyst    right wrist  . GERD (gastroesophageal reflux disease)    PMH  . Hiatal hernia   . HOH (hard of hearing)   . Hyperlipidemia   . Hypothyroidism   . Inguinal hernia    Left  . Internal hemorrhoids   . Iron deficiency anemia   . Obstructive sleep apnea   . Osteoporosis   . Paroxysmal atrial fibrillation (HCC)    Amiodarone therapy, status post Cox-Maze procedure but with recurrent atrial fibrillation while on amiodarone August 2012  . Presence of permanent cardiac pacemaker   . Symptomatic bradycardia   . Tubular adenoma of colon   . 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 Warfarin anticoagulation   . Wears glasses   . Wears partial dentures     Past Surgical History:  Procedure Laterality Date  . AORTIC VALVE REPLACEMENT    . CARPAL TUNNEL RELEASE  2009  . CATARACT EXTRACTION, BILATERAL  07/2011  . COLONOSCOPY W/ BIOPSIES AND POLYPECTOMY    . CORONARY ARTERY BYPASS GRAFT  2008   x2  . HERNIA REPAIR  age 41  . INGUINAL HERNIA REPAIR Left 08/30/2017   Procedure: LEFT INGUINAL HERNIA REPAIR;  Surgeon: Rolm Bookbinder, MD;  Location: [place];  Service: General;  Laterality: Left;  GENERAL ETT AND TAP BLOCK  . INSERTION OF MESH Left 08/30/2017  Procedure: INSERTION OF MESH;  Surgeon: Rolm Bookbinder, MD;  Location: [place];  Service: General;  Laterality: Left;  GENERAL ETT AND TAP BLOCK  . KNEE ARTHROSCOPY    . MASS EXCISION Right 01/26/2018   Procedure: Right wrist dorsal mass excision;  Surgeon: Iran Planas, MD;  Location: [place];  Service: Orthopedics;  Laterality: Right;  60 mins  . PACEMAKER INSERTION  2009   Initially implanted at [facility], extracted by Dr Lovena Le for endocarditis and reimplanted 8/12  . PATELLA FRACTURE SURGERY  age 73   screw placed and removed  . TEE WITHOUT CARDIOVERSION N/A 07/05/2017   Procedure: TRANSESOPHAGEAL ECHOCARDIOGRAM (TEE);  Surgeon: Sanda Klein, MD;  Location: [place];  Service: Cardiovascular;  Laterality: N/A;  . TONSILLECTOMY  1939  . TOTAL HIP ARTHROPLASTY      Current Outpatient Medications  Medication Sig Dispense Refill  . amiodarone (PACERONE) 200 MG tablet TAKE 0.5 (1/2) TABLETS (100 MG TOTAL) BY MOUTH DAILY. (Patient taking differently: Take 100 mg by mouth every morning. ) 45 tablet 3  . amLODipine (NORVASC) 5 MG tablet Take 5 mg by mouth every morning.   3  . amoxicillin (AMOXIL) 500 MG tablet Take 2,000 mg by mouth See admin instructions. Take four capsules (2000 mg) by mouth one hour prior to dental appointments    . benazepril (LOTENSIN) 40 MG tablet TAKE 1 TABLET BY MOUTH EVERY DAY (Patient taking differently: Take 40 mg by mouth every morning. ) 90 tablet 3  . carvedilol (COREG) 3.125 MG tablet TAKE 1 TABLET (3.125 MG TOTAL) BY MOUTH 2 (TWO) TIMES DAILY. (Patient taking differently: Take 3.125 mg by mouth 2 (two) times daily with a meal. ) 180 tablet 3  . docusate sodium (COLACE) 100 MG capsule Take 200-300 mg by mouth at bedtime.     Marland Kitchen levothyroxine (SYNTHROID, LEVOTHROID) 50 MCG tablet Take 50 mcg by mouth daily before breakfast.    . Multiple Vitamin (MULTIVITAMIN WITH MINERALS) TABS tablet Take 0.5 tablets by mouth 2 (two) times daily.    . nitroGLYCERIN (NITROSTAT) 0.4 MG SL tablet Place 1 tablet (0.4 mg total) under the tongue every 5 (five) minutes as needed. Up to 3 doses, if no relief after 3rd dose, proceed to the ED for an evaluation. (Patient taking differently: Place 0.4 mg under the tongue every 5 (five) minutes as needed for chest pain (Up to 3 doses, if no relief after 3rd dose, proceed to the ED for an evaluation.). ) 25 tablet 3  . potassium chloride SA (KLOR-CON M20) 20 MEQ tablet Take 2 tablets (40 mEq total) by mouth every morning. 60 tablet 0  . pravastatin (PRAVACHOL) 40 MG tablet TAKE 1 TABLET BY MOUTH EVERY EVENING (Patient taking differently: Take 40 mg by mouth daily with supper. ) 90  tablet 3  . Probiotic Product (PROBIOTIC DAILY PO) Take 1 tablet by mouth every morning.     . Psyllium (METAMUCIL PO) Take 5 mLs by mouth every morning. Mix in orange juice    . torsemide (DEMADEX) 20 MG tablet Take 2 tablets (40 mg total) by mouth daily. 60 tablet 6  . warfarin (COUMADIN) 2 MG tablet Take 2-3 mg by mouth See admin instructions. Take 1 1/2 tablet (3 mg) by mouth on Sunday morning, take 1 tablet (2 mg) on all other mornings of the week     No current facility-administered medications for this visit.     Allergies:   Patient has no known allergies.   Social  History:  The patient  reports that he quit smoking about 64 years ago. His smoking use included cigarettes. He started smoking about 75 years ago. He has a 2.40 pack-year smoking history. He has never used smokeless tobacco. He reports current alcohol use. He reports that he does not use drugs.   Family History:  The patient's family history includes Cancer in his father; Colon cancer in his mother; Coronary artery disease in his father; Other in his father.   ROS:  Please see the history of present illness.   All other systems are personally reviewed and negative.    Exam:    Vital Signs:  Ht 5\' 6"  (1.676 m)   Wt 149 lb (67.6 kg)   BMI 24.05 kg/m   Well sounding and appearing, alert and conversant, regular work of breathing  Labs/Other Tests and Data Reviewed:    Recent Labs: 12/27/2018: ALT 22 04/21/2019: B Natriuretic Peptide 1,054.2 04/26/2019: BUN 29; Creatinine, Ser 1.20; Hemoglobin 10.8; Magnesium 2.1; Platelets 102; Potassium 3.5; Sodium 141   Wt Readings from Last 3 Encounters:  06/02/19 149 lb (67.6 kg)  05/05/19 152 lb 9.6 oz (69.2 kg)  04/26/19 156 lb 4.9 oz (70.9 kg)     Last device remote is reviewed from [facility] PDF which reveals normal device function    ASSESSMENT & PLAN:    1.  Sinus bradycardia/complete heart block Normal pacemaker function by recent remote See recent [employer] report   2.  Paroxysmal atrial fibrillation Continue low dose amiodarone [place] 0%  3.  HTN Stable No change required today  4.  Valvular heart disease Followed by [name]  5.  Chronic combined systolic and diastolic heart failure Decrease Torsemide to 30mg  daily  Follow up with [name] as scheduled    Follow-up:  [facility], me in 1 year    Patient Risk:  after full review of this patients clinical status, I feel that they are at moderate risk at this time.  Today, I have spent 15 minutes with the patient with telehealth technology discussing arrhythmia management .    Army Fossa, MD  06/02/2019 10:50 AM     [facility] [address] [place] [address] [phone] (office) [phone] (fax)

## 2019-06-03 LAB — BASIC METABOLIC PANEL WITH GFR
BUN/Creatinine Ratio: 23 (calc) — ABNORMAL HIGH (ref 6–22)
BUN: 29 mg/dL — ABNORMAL HIGH (ref 7–25)
CO2: 32 mmol/L (ref 20–32)
Calcium: 9 mg/dL (ref 8.6–10.3)
Chloride: 105 mmol/L (ref 98–110)
Creat: 1.26 mg/dL — ABNORMAL HIGH (ref 0.70–1.11)
GFR, Est African American: 59 mL/min/{1.73_m2} — ABNORMAL LOW (ref >=60)
GFR, Est Non African American: 51 mL/min/{1.73_m2} — ABNORMAL LOW (ref >=60)
Glucose, Bld: 121 mg/dL — ABNORMAL HIGH (ref 65–99)
Potassium: 4.7 mmol/L (ref 3.5–5.3)
Sodium: 142 mmol/L (ref 135–146)

## 2019-06-03 LAB — CLIENT EDUCATION TRACKING

## 2019-06-05 ENCOUNTER — Telehealth: Payer: Self-pay | Admitting: *Deleted

## 2019-06-05 NOTE — Telephone Encounter (Signed)
-----   Message from Satira Sark, MD sent at 06/03/2019  7:29 AM EST ----- Results reviewed.  Creatinine has minimally increased from 1.20-1.26, potassium normal.  Continue with same diuretic plan.

## 2019-06-05 NOTE — Telephone Encounter (Signed)
Patient informed. Copy sent to PCP °

## 2019-06-08 ENCOUNTER — Ambulatory Visit (INDEPENDENT_AMBULATORY_CARE_PROVIDER_SITE_OTHER): Payer: Medicare Other | Admitting: *Deleted

## 2019-06-08 DIAGNOSIS — I495 Sick sinus syndrome: Secondary | ICD-10-CM

## 2019-06-08 DIAGNOSIS — I5043 Acute on chronic combined systolic (congestive) and diastolic (congestive) heart failure: Secondary | ICD-10-CM

## 2019-06-08 LAB — CUP PACEART REMOTE DEVICE CHECK
Battery Impedance: 3163 Ohm
Battery Remaining Longevity: 13 mo
Battery Voltage: 2.7 V
Brady Statistic AP VP Percent: 99 %
Brady Statistic AP VS Percent: 0 %
Brady Statistic AS VP Percent: 0 %
Brady Statistic AS VS Percent: 0 %
Date Time Interrogation Session: 20201112180702
Implantable Lead Implant Date: 20120815
Implantable Lead Implant Date: 20120815
Implantable Lead Location: 753859
Implantable Lead Location: 753860
Implantable Lead Model: 5076
Implantable Lead Model: 5076
Implantable Pulse Generator Implant Date: 20120815
Lead Channel Impedance Value: 415 Ohm
Lead Channel Impedance Value: 451 Ohm
Lead Channel Pacing Threshold Amplitude: 0.875 V
Lead Channel Pacing Threshold Pulse Width: 0.4 ms
Lead Channel Setting Pacing Amplitude: 2.5 V
Lead Channel Setting Pacing Amplitude: 2.5 V
Lead Channel Setting Pacing Pulse Width: 0.4 ms
Lead Channel Setting Sensing Sensitivity: 2 mV

## 2019-06-15 ENCOUNTER — Ambulatory Visit (INDEPENDENT_AMBULATORY_CARE_PROVIDER_SITE_OTHER): Payer: Medicare Other | Admitting: Cardiology

## 2019-06-15 ENCOUNTER — Other Ambulatory Visit: Payer: Self-pay

## 2019-06-15 ENCOUNTER — Encounter: Payer: Self-pay | Admitting: Cardiology

## 2019-06-15 VITALS — BP 122/62 | HR 76 | Ht 66.0 in | Wt 154.0 lb

## 2019-06-15 DIAGNOSIS — I5032 Chronic diastolic (congestive) heart failure: Secondary | ICD-10-CM | POA: Diagnosis not present

## 2019-06-15 DIAGNOSIS — I5042 Chronic combined systolic (congestive) and diastolic (congestive) heart failure: Secondary | ICD-10-CM | POA: Diagnosis not present

## 2019-06-15 DIAGNOSIS — I480 Paroxysmal atrial fibrillation: Secondary | ICD-10-CM | POA: Diagnosis not present

## 2019-06-15 DIAGNOSIS — I25119 Atherosclerotic heart disease of native coronary artery with unspecified angina pectoris: Secondary | ICD-10-CM | POA: Diagnosis not present

## 2019-06-15 MED ORDER — TORSEMIDE 20 MG PO TABS: 20.0000 mg | ORAL_TABLET | ORAL | 6 refills | Status: AC

## 2019-06-15 MED ORDER — POTASSIUM CHLORIDE CRYS ER 20 MEQ PO TBCR: 20.0000 meq | EXTENDED_RELEASE_TABLET | ORAL | 6 refills | Status: AC

## 2019-06-15 NOTE — Progress Notes (Signed)
Cardiology Office Note  Date: 06/15/2019   ID: Riley, Becker Sep 21, 1928, MRN [id number]  PCP:  Eber Hong, MD  Cardiologist:  Rozann Lesches, MD Electrophysiologist:  Thompson Grayer, MD   Chief Complaint  Patient presents with  . Cardiac follow-up    History of Present Illness: [name] is an 85 y.o. male pain in October.  He is here today with his wife for a follow-up visit.  Overall, they are satisfied with his current diuretic regimen, weight has increased a few pounds by his home scales, but he still has a lot of problems with incontinence and does not make it to the bathroom in time.  He would like to try to reduce the standing dose somewhat and use an extra dose of diuretic as needed for weight increase.  Telehealth encounter noted with Dr. Rayann Heman recently pacemaker in place with history of symptomatic bradycardia and complete heart block.  Device interrogation showed normal function.  Recent lab work showed minor change in renal function with creatinine 1.26, potassium 4.7.  I reviewed this with him today.  We went over his medications.  Past Medical History:  Diagnosis Date  . Anemia   . Arthritis   . Atrial flutter (HCC)    Amiodarone therapy, coumadin therapy, prior history of cardioversion and overdrive pacing  . Barrett's esophagus   . Benign prostatic hyperplasia   . Cardiomyopathy    Mixed ischemic and nonischemic with previous EF of 35-40%, most recently in June 2009 normalized to 55-60%. Echo 2/11 EF 55-60%  . Carotid artery disease (HCC)    Less than 50% bilateral internal carotid artery stenosis by Doppler 2009  . Carpal tunnel syndrome   . Chronic diastolic (congestive) heart failure ([place])   . Coronary atherosclerosis of native coronary artery    Status post coronary bypass grafting  . 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 Endocarditis   . Essential hypertension, benign   . Ganglion  cyst    right wrist  . GERD (gastroesophageal reflux disease)    PMH  . Hiatal hernia   . HOH (hard of hearing)   . Hyperlipidemia   . Hypothyroidism   . Inguinal hernia    Left  . Internal hemorrhoids   . Iron deficiency anemia   . Obstructive sleep apnea   . Osteoporosis   . Paroxysmal atrial fibrillation (HCC)    Amiodarone therapy, status post Cox-Maze procedure but with recurrent atrial fibrillation while on amiodarone August 2012  . Presence of permanent cardiac pacemaker   . Symptomatic bradycardia   . Tubular adenoma of colon   . 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 Warfarin anticoagulation   . Wears glasses   . Wears partial dentures     Past Surgical History:  Procedure Laterality Date  . AORTIC VALVE REPLACEMENT    . CARPAL TUNNEL RELEASE  2009  . CATARACT EXTRACTION, BILATERAL  07/2011  . COLONOSCOPY W/ BIOPSIES AND POLYPECTOMY    . CORONARY ARTERY BYPASS GRAFT  2008   x2  . HERNIA REPAIR  age 32  . INGUINAL HERNIA REPAIR Left 08/30/2017   Procedure: LEFT INGUINAL HERNIA REPAIR;  Surgeon: Rolm Bookbinder, MD;  Location: [place];  Service: General;  Laterality: Left;  GENERAL ETT AND TAP BLOCK  . INSERTION OF MESH Left 08/30/2017   Procedure: INSERTION OF MESH;  Surgeon:  Rolm Bookbinder, MD;  Location: [place];  Service: General;  Laterality: Left;  GENERAL ETT AND TAP BLOCK  . KNEE ARTHROSCOPY    . MASS EXCISION Right 01/26/2018   Procedure: Right wrist dorsal mass excision;  Surgeon: Iran Planas, MD;  Location: [place];  Service: Orthopedics;  Laterality: Right;  60 mins  . PACEMAKER INSERTION  2009   Initially implanted at [facility], extracted by Dr Lovena Le for endocarditis and reimplanted 8/12  . PATELLA FRACTURE SURGERY  age 54   screw placed and removed  . TEE WITHOUT CARDIOVERSION N/A 07/05/2017   Procedure: TRANSESOPHAGEAL ECHOCARDIOGRAM (TEE);  Surgeon: Sanda Klein, MD;  Location: [place];   Service: Cardiovascular;  Laterality: N/A;  . TONSILLECTOMY  1939  . TOTAL HIP ARTHROPLASTY      Current Outpatient Medications  Medication Sig Dispense Refill  . amiodarone (PACERONE) 200 MG tablet TAKE 0.5 (1/2) TABLETS (100 MG TOTAL) BY MOUTH DAILY. (Patient taking differently: Take 100 mg by mouth every morning. ) 45 tablet 3  . amLODipine (NORVASC) 5 MG tablet Take 5 mg by mouth every morning.   3  . amoxicillin (AMOXIL) 500 MG tablet Take 2,000 mg by mouth See admin instructions. Take four capsules (2000 mg) by mouth one hour prior to dental appointments    . benazepril (LOTENSIN) 40 MG tablet TAKE 1 TABLET BY MOUTH EVERY DAY (Patient taking differently: Take 40 mg by mouth every morning. ) 90 tablet 3  . carvedilol (COREG) 3.125 MG tablet TAKE 1 TABLET (3.125 MG TOTAL) BY MOUTH 2 (TWO) TIMES DAILY. (Patient taking differently: Take 3.125 mg by mouth 2 (two) times daily with a meal. ) 180 tablet 3  . docusate sodium (COLACE) 100 MG capsule Take 200-300 mg by mouth at bedtime.     Marland Kitchen levothyroxine (SYNTHROID, LEVOTHROID) 50 MCG tablet Take 50 mcg by mouth daily before breakfast.    . Multiple Vitamin (MULTIVITAMIN WITH MINERALS) TABS tablet Take 0.5 tablets by mouth 2 (two) times daily.    . nitroGLYCERIN (NITROSTAT) 0.4 MG SL tablet Place 1 tablet (0.4 mg total) under the tongue every 5 (five) minutes as needed. Up to 3 doses, if no relief after 3rd dose, proceed to the ED for an evaluation. (Patient taking differently: Place 0.4 mg under the tongue every 5 (five) minutes as needed for chest pain (Up to 3 doses, if no relief after 3rd dose, proceed to the ED for an evaluation.). ) 25 tablet 3  . potassium chloride SA (KLOR-CON M20) 20 MEQ tablet Take 1-2 tablets (20-40 mEq total) by mouth as directed. Take 2 on days with 40 mg torsemide and 1 on days with 20 mg torsemide 60 tablet 6  . pravastatin (PRAVACHOL) 40 MG tablet TAKE 1 TABLET BY MOUTH EVERY EVENING (Patient taking differently: Take 40  mg by mouth daily with supper. ) 90 tablet 3  . Probiotic Product (PROBIOTIC DAILY PO) Take 1 tablet by mouth every morning.     . Psyllium (METAMUCIL PO) Take 5 mLs by mouth every morning. Mix in orange juice    . torsemide (DEMADEX) 20 MG tablet Take 1-2 tablets (20-40 mg total) by mouth as directed. Take 40 mg alternating with 20 mg daily 60 tablet 6  . warfarin (COUMADIN) 2 MG tablet Take 2-3 mg by mouth See admin instructions. Take 1 1/2 tablet (3 mg) by mouth on Sunday morning, take 1 tablet (2 mg) on all other mornings of the week     No current facility-administered  medications for this visit.    Allergies:  Patient has no known allergies.   Social History: The patient  reports that he quit smoking about 64 years ago. His smoking use included cigarettes. He started smoking about 75 years ago. He has a 2.40 pack-year smoking history. He has never used smokeless tobacco. He reports current alcohol use. He reports that he does not use drugs.   ROS:  Please see the history of present illness. Otherwise, complete review of systems is positive for hearing loss.  All other systems are reviewed and negative.   Physical Exam: VS:  BP 122/62   Pulse 76   Ht 5\' 6"  (1.676 m)   Wt 154 lb (69.9 kg)   SpO2 97%   BMI 24.86 kg/m , BMI Body mass index is 24.86 kg/m.  Wt Readings from Last 3 Encounters:  06/15/19 154 lb (69.9 kg)  06/02/19 149 lb (67.6 kg)  05/05/19 152 lb 9.6 oz (69.2 kg)    General: Elderly male, wearing oxygen via nasal cannula, using rolling walker. HEENT: Conjunctiva and lids normal, wearing a mask. Neck: Supple, no elevated JVP or carotid bruits, no thyromegaly. Lungs: Decreased breath sounds, nonlabored breathing at rest. Cardiac: Regular rate and rhythm, no S3, 3/6 apical systolic murmur, no pericardial rub. Abdomen: Soft, nontender, bowel sounds present. Extremities: Mild leg edema as before, distal pulses 2+. Skin: Warm and dry. Musculoskeletal: No kyphosis.  Neuropsychiatric: Alert and oriented x3, affect grossly appropriate.  ECG:  An ECG dated 02/18/2019 was personally reviewed today and demonstrated:  Dual-chamber pacing.  Recent Labwork: 12/27/2018: ALT 22; AST 32 04/21/2019: B Natriuretic Peptide 1,054.2 04/26/2019: Hemoglobin 10.8; Magnesium 2.1; Platelets 102 06/01/2019: BUN 29; Creat 1.26; Potassium 4.7; Sodium 142   Other Studies Reviewed Today:  Echocardiogram 04/22/2019:  1. Left ventricular ejection fraction, by visual estimation, is 45 to 50%. The left ventricle has normal function. Normal left ventricular size. There is no left ventricular hypertrophy.  2. Abnormal septal motion consistent with RV pacemaker and right ventricular volume overload.  3. Left ventricular diastolic function could not be evaluated pattern of LV diastolic filling.  4. Global right ventricle has severely reduced systolic function.The right ventricular size is severely enlarged. No increase in right ventricular wall thickness.  5. Left atrial size was severely dilated.  6. Right atrial size was severely dilated.  7. The mitral valve has been repaired/replaced. Moderate to severe mitral valve regurgitation. Mild mitral stenosis.  8. The tricuspid valve is grossly normal. Tricuspid valve regurgitation severe.  9. Aortic valve regurgitation was not visualized by color flow Doppler. 10. The pulmonic valve was grossly normal. Pulmonic valve regurgitation is trivial by color flow Doppler. 11. Moderately elevated pulmonary artery systolic pressure. 12. A pacer wire is visualized. 13. The inferior vena cava is dilated in size with <50% respiratory variability, suggesting right atrial pressure of 15 mmHg. 66. Compared to the 2018 study, mitral regurgitation has worsened and the AV prosthesis gradients have improved (possibly a function of lower cardiac output; the patient was anemic in 2018). Although the LVEF was reported higher in 2018, the side-by-side  comparison  shows similar LV contractility.  Assessment and Plan:  1.  Chronic combined heart failure with LVEF 45 to 50% and severe RV dysfunction as well as valvular heart disease.  We are going to attempt to change his Demadex to 40 mg alternating with 20 mg every other day and dose KCl similarly with 40 mEq alternating with 20 mill equivalents every other  day.  Continue to weigh daily, if weight goes up 2 to 3 pounds in 24 hours take extra dose of Demadex and potassium.  Follow-up BMET for next visit.  2.  Valvular heart disease with history of bioprosthetic AVR and mitral valve repair.  He has manifested progressive mitral and tricuspid regurgitation over time which we are managing conservatively.  He has not a candidate for repeat valve operation.  3.  CAD status post CABG.  He reports no active angina symptoms and continues on Pravachol.  4.  [employer] pacemaker in place with follow-up by Dr. Rayann Heman.  5.  Paroxysmal atrial fibrillation.  He continues on amiodarone and Coumadin.  Medication Adjustments/Labs and Tests Ordered: Current medicines are reviewed at length with the patient today.  Concerns regarding medicines are outlined above.   Tests Ordered: Orders Placed This Encounter  Procedures  . Basic metabolic panel    Medication Changes: Meds ordered this encounter  Medications  . torsemide (DEMADEX) 20 MG tablet    Sig: Take 1-2 tablets (20-40 mg total) by mouth as directed. Take 40 mg alternating with 20 mg daily    Dispense:  60 tablet    Refill:  6    06/15/2019 change in directions  . potassium chloride SA (KLOR-CON M20) 20 MEQ tablet    Sig: Take 1-2 tablets (20-40 mEq total) by mouth as directed. Take 2 on days with 40 mg torsemide and 1 on days with 20 mg torsemide    Dispense:  60 tablet    Refill:  6    06/15/2019 change in directions    Disposition:  Follow up 3 months in the [place] office.  Signed, Satira Sark, MD, [facility] 06/15/2019 3:52 PM    [facility] [address], [place], [place] [address] Phone: [phone]; Fax: [phone]

## 2019-06-15 NOTE — Patient Instructions (Addendum)
Medication Instructions:   Your physician has recommended you make the following change in your medication:   Change torsemide to 40 mg alternating with 20 mg daily  Change potassium to taking (2) with your 40 mg torsemide and (1) with your 20 mg torsemide  Continue other medications the same  Labwork:  Your physician recommends that you return for non-fasting lab work in: 3 months just before your next visit to check your BMET. You may have this arranged with your family doctor.   Testing/Procedures:  NONE  Follow-Up:  Your physician recommends that you schedule a follow-up appointment in: 3 months.   Any Other Special Instructions Will Be Listed Below (If Applicable).  If you need a refill on your cardiac medications before your next appointment, please call your pharmacy.

## 2019-06-28 ENCOUNTER — Other Ambulatory Visit: Payer: Self-pay

## 2019-06-28 ENCOUNTER — Inpatient Hospital Stay (HOSPITAL_BASED_OUTPATIENT_CLINIC_OR_DEPARTMENT_OTHER): Payer: Medicare Other | Admitting: Hematology

## 2019-06-28 ENCOUNTER — Inpatient Hospital Stay: Payer: Medicare Other | Attending: Hematology

## 2019-06-28 VITALS — BP 102/61 | HR 74 | Temp 98.3°F | Resp 18 | Ht 66.0 in | Wt 154.5 lb

## 2019-06-28 DIAGNOSIS — Z7901 Long term (current) use of anticoagulants: Secondary | ICD-10-CM | POA: Insufficient documentation

## 2019-06-28 DIAGNOSIS — D500 Iron deficiency anemia secondary to blood loss (chronic): Secondary | ICD-10-CM | POA: Diagnosis not present

## 2019-06-28 DIAGNOSIS — Z8249 Family history of ischemic heart disease and other diseases of the circulatory system: Secondary | ICD-10-CM | POA: Diagnosis not present

## 2019-06-28 DIAGNOSIS — Z87891 Personal history of nicotine dependence: Secondary | ICD-10-CM | POA: Insufficient documentation

## 2019-06-28 DIAGNOSIS — I25119 Atherosclerotic heart disease of native coronary artery with unspecified angina pectoris: Secondary | ICD-10-CM

## 2019-06-28 DIAGNOSIS — Z79899 Other long term (current) drug therapy: Secondary | ICD-10-CM | POA: Insufficient documentation

## 2019-06-28 DIAGNOSIS — I480 Paroxysmal atrial fibrillation: Secondary | ICD-10-CM | POA: Insufficient documentation

## 2019-06-28 DIAGNOSIS — E039 Hypothyroidism, unspecified: Secondary | ICD-10-CM | POA: Insufficient documentation

## 2019-06-28 DIAGNOSIS — D509 Iron deficiency anemia, unspecified: Secondary | ICD-10-CM | POA: Diagnosis present

## 2019-06-28 DIAGNOSIS — I739 Peripheral vascular disease, unspecified: Secondary | ICD-10-CM | POA: Insufficient documentation

## 2019-06-28 DIAGNOSIS — Z8719 Personal history of other diseases of the digestive system: Secondary | ICD-10-CM | POA: Diagnosis not present

## 2019-06-28 DIAGNOSIS — Z86010 Personal history of colonic polyps: Secondary | ICD-10-CM | POA: Insufficient documentation

## 2019-06-28 DIAGNOSIS — Z800 Family history of malignant neoplasm of digestive organs: Secondary | ICD-10-CM | POA: Insufficient documentation

## 2019-06-28 DIAGNOSIS — M1990 Unspecified osteoarthritis, unspecified site: Secondary | ICD-10-CM | POA: Insufficient documentation

## 2019-06-28 DIAGNOSIS — K449 Diaphragmatic hernia without obstruction or gangrene: Secondary | ICD-10-CM | POA: Diagnosis not present

## 2019-06-28 DIAGNOSIS — I5042 Chronic combined systolic (congestive) and diastolic (congestive) heart failure: Secondary | ICD-10-CM | POA: Diagnosis not present

## 2019-06-28 DIAGNOSIS — I2510 Atherosclerotic heart disease of native coronary artery without angina pectoris: Secondary | ICD-10-CM | POA: Diagnosis not present

## 2019-06-28 DIAGNOSIS — Z809 Family history of malignant neoplasm, unspecified: Secondary | ICD-10-CM | POA: Diagnosis not present

## 2019-06-28 DIAGNOSIS — Z9981 Dependence on supplemental oxygen: Secondary | ICD-10-CM | POA: Diagnosis not present

## 2019-06-28 DIAGNOSIS — K5900 Constipation, unspecified: Secondary | ICD-10-CM | POA: Insufficient documentation

## 2019-06-28 DIAGNOSIS — R195 Other fecal abnormalities: Secondary | ICD-10-CM | POA: Diagnosis not present

## 2019-06-28 DIAGNOSIS — K2270 Barrett's esophagus without dysplasia: Secondary | ICD-10-CM | POA: Insufficient documentation

## 2019-06-28 DIAGNOSIS — I110 Hypertensive heart disease with heart failure: Secondary | ICD-10-CM | POA: Diagnosis not present

## 2019-06-28 LAB — CBC WITH DIFFERENTIAL/PLATELET
Abs Immature Granulocytes: 0.01 10*3/uL (ref 0.00–0.07)
Basophils Absolute: 0.1 10*3/uL (ref 0.0–0.1)
Basophils Relative: 2 %
Eosinophils Absolute: 0.1 10*3/uL (ref 0.0–0.5)
Eosinophils Relative: 2 %
HCT: 27.2 % — ABNORMAL LOW (ref 39.0–52.0)
Hemoglobin: 8.2 g/dL — ABNORMAL LOW (ref 13.0–17.0)
Immature Granulocytes: 0 %
Lymphocytes Relative: 11 %
Lymphs Abs: 0.5 10*3/uL — ABNORMAL LOW (ref 0.7–4.0)
MCH: 25.4 pg — ABNORMAL LOW (ref 26.0–34.0)
MCHC: 30.1 g/dL (ref 30.0–36.0)
MCV: 84.2 fL (ref 80.0–100.0)
Monocytes Absolute: 0.5 10*3/uL (ref 0.1–1.0)
Monocytes Relative: 11 %
Neutro Abs: 3.5 10*3/uL (ref 1.7–7.7)
Neutrophils Relative %: 74 %
Platelets: 132 10*3/uL — ABNORMAL LOW (ref 150–400)
RBC: 3.23 MIL/uL — ABNORMAL LOW (ref 4.22–5.81)
RDW: 17.8 % — ABNORMAL HIGH (ref 11.5–15.5)
WBC: 4.7 10*3/uL (ref 4.0–10.5)
nRBC: 0 % (ref 0.0–0.2)

## 2019-06-28 LAB — CMP (CANCER CENTER ONLY)
ALT: 18 U/L (ref 0–44)
AST: 26 U/L (ref 15–41)
Albumin: 3.6 g/dL (ref 3.5–5.0)
Alkaline Phosphatase: 116 U/L (ref 38–126)
Anion gap: 10 (ref 5–15)
BUN: 26 mg/dL — ABNORMAL HIGH (ref 8–23)
CO2: 29 mmol/L (ref 22–32)
Calcium: 9.2 mg/dL (ref 8.9–10.3)
Chloride: 102 mmol/L (ref 98–111)
Creatinine: 1.29 mg/dL — ABNORMAL HIGH (ref 0.61–1.24)
GFR, Est AFR Am: 57 mL/min — ABNORMAL LOW (ref >60)
GFR, Estimated: 49 mL/min — ABNORMAL LOW (ref >60)
Glucose, Bld: 106 mg/dL — ABNORMAL HIGH (ref 70–99)
Potassium: 4 mmol/L (ref 3.5–5.1)
Sodium: 141 mmol/L (ref 135–145)
Total Bilirubin: 0.8 mg/dL (ref 0.3–1.2)
Total Protein: 6.9 g/dL (ref 6.5–8.1)

## 2019-06-28 LAB — FERRITIN: Ferritin: 54 ng/mL (ref 24–336)

## 2019-06-28 NOTE — Progress Notes (Signed)
Marland Kitchen    HEMATOLOGY/ONCOLOGY CLINIC NOTE  Date of Service: 06/28/19   Patient Care Team: Eber Hong, MD as PCP - General (Internal Medicine) Satira Sark, MD as PCP - Cardiology (Cardiology) Thompson Grayer, MD as PCP - Electrophysiology (Cardiology) Zenovia Jarred MD (Gastroenterology)  CHIEF COMPLAINTS F/u for iron deficiency anemia  HISTORY OF PRESENTING ILLNESS:   [name] is a wonderful 85 y.o. male who has been referred to [place] by Dr .Eber Hong, MD  for evaluation and management of Iron deficiency Anemia  He has a history of multiple medical comorbidities including coronary artery disease, chronic systolic CHF, atrial fibrillation on Coumadin, peripheral vascular disease, permanent pacemaker placement, adenomatous colonic polyps and long segment Barrett's esophagus without significant dysplasia on chronic PPI therapy.  Patient has been noting increasing fatigue from earlier this year and was noted to have anemia with hemoglobin of 7.8 with an MCV of 69 in January 2017. Ferritin level was noted to be 19 with an iron saturation of 3%. Fecal occult blood testing in March was noted to be positive for occult GI bleeding.  Patient received IV Feraheme in March and July 2017 for iron replacement. He had an upper endoscopy and colonoscopy which were performed on 11/12/2015. EGD revealed 4 cm of Barrett's esophagus which was biopsied and found to be Barrett's without dysplasia. There was a 3 cm hiatal hernia. Normal stomach and normal duodenum. Duodenal biopsies were negative for celiac. Colonoscopy performed on the same day revealed 2 cecal polyps. 3 ascending colon polyps one of which was 14 mm and removed with hot snare. It was prophylactically endoclipped. The remainder of the examination was unremarkable. Internal hemorrhoids were seen. These polyps were found to be adenomas without high-grade dysplasia. Has not had a capsule endoscopy.  Patient's hemoglobin levels have been  somewhat better in the mid to high 9 range but with persistent iron deficiency and lack of normalization of his anemia.  He has been referred to [place] for further management of his iron deficiency anemia. He continues to be on Coumadin for his atrial fibrillation. He has been on oral iron preparation which is causing some constipation and darker stools. Reports no overt GI bleeding. His last INR was 2.3 yesterday as per his report. He monitors this with his primary care physician. Notes persistent fatigue which is likely multifactorial.  Is on levothyroxine replacement for hypothyroidism. Takes a daily multivitamin.  Patient reports that he notes his weight closely to avoid fluid overload from CHF and notes no weight loss.  No other evidence of overt bleeding. No hematuria no gum bleeds no epistaxis. Notes easy skin bruising. Not using any over-the-counter NSAIDs.   INTERVAL HISTORY   Mr. Lojewski Is here today for management and evaluation for Iron deficiency anemia. We are joined today by his wife. The patient's last visit with [place] was on 12/27/2018. The pt reports that he is doing well overall.  The pt reports that he was in the hospital in September and was placed on 24-hour oxygen afterwards. Pt continues to take Coumadin and was changed from Furosemide to Torsemide. His Coumadin dosage was increased to one pill per day 6 days a week and 1.5 pills per day once a week. He has had some occasional, minor nose bleeds but denies any other bleeding concerns. Pt has coninued to wear compression socks.   Lab results today (06/28/19) of CBC w/diff and CMP is as follows: all values are WNL except for RBC at 3.23, Hgb at  8.2, HCT at 27.2, MCH at 25.4, RDW at 17.8, PLTs at 132K, Lymphs Abs at 0.5, Glucose at 106, BUN at 26, Creatinine at 1.29, GFR Est Non Af Am at 49. 06/28/2019 Ferritin at 54  On review of systems, pt reports occasional nose bleeds, mild fatigue and denies abdominal pain, gum bleeds,  bloody/black stools, other bleeding concerns and any other symptoms.   MEDICAL HISTORY:  Past Medical History:  Diagnosis Date  . Anemia   . Arthritis   . Atrial flutter (HCC)    Amiodarone therapy, coumadin therapy, prior history of cardioversion and overdrive pacing  . Barrett's esophagus   . Benign prostatic hyperplasia   . Cardiomyopathy    Mixed ischemic and nonischemic with previous EF of 35-40%, most recently in June 2009 normalized to 55-60%. Echo 2/11 EF 55-60%  . Carotid artery disease (HCC)    Less than 50% bilateral internal carotid artery stenosis by Doppler 2009  . Carpal tunnel syndrome   . Chronic diastolic (congestive) heart failure ([place])   . Coronary atherosclerosis of native coronary artery    Status post coronary bypass grafting  . 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 Endocarditis   . Essential hypertension, benign   . Ganglion cyst    right wrist  . GERD (gastroesophageal reflux disease)    PMH  . Hiatal hernia   . HOH (hard of hearing)   . Hyperlipidemia   . Hypothyroidism   . Inguinal hernia    Left  . Internal hemorrhoids   . Iron deficiency anemia   . Obstructive sleep apnea   . Osteoporosis   . Paroxysmal atrial fibrillation (HCC)    Amiodarone therapy, status post Cox-Maze procedure but with recurrent atrial fibrillation while on amiodarone August 2012  . Presence of permanent cardiac pacemaker   . Symptomatic bradycardia   . Tubular adenoma of colon   . 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 Warfarin anticoagulation   . Wears glasses   . Wears partial dentures     SURGICAL HISTORY: Past Surgical History:  Procedure Laterality Date  . AORTIC VALVE REPLACEMENT    . CARPAL TUNNEL RELEASE  2009  . CATARACT EXTRACTION, BILATERAL  07/2011  . COLONOSCOPY W/ BIOPSIES AND POLYPECTOMY    . CORONARY ARTERY BYPASS GRAFT  2008   x2  .  HERNIA REPAIR  age 79  . INGUINAL HERNIA REPAIR Left 08/30/2017   Procedure: LEFT INGUINAL HERNIA REPAIR;  Surgeon: Rolm Bookbinder, MD;  Location: [facility];  Service: General;  Laterality: Left;  GENERAL ETT AND TAP BLOCK  . INSERTION OF MESH Left 08/30/2017   Procedure: INSERTION OF MESH;  Surgeon: Rolm Bookbinder, MD;  Location: [place] [facility];  Service: General;  Laterality: Left;  GENERAL ETT AND TAP BLOCK  . KNEE ARTHROSCOPY    . MASS EXCISION Right 01/26/2018   Procedure: Right wrist dorsal mass excision;  Surgeon: Iran Planas, MD;  Location: [place];  Service: Orthopedics;  Laterality: Right;  60 mins  . PACEMAKER INSERTION  2009   Initially implanted at [facility], extracted by Dr Lovena Le for endocarditis and reimplanted 8/12  . PATELLA FRACTURE SURGERY  age 21   screw placed and removed  . TEE WITHOUT CARDIOVERSION N/A 07/05/2017   Procedure: TRANSESOPHAGEAL ECHOCARDIOGRAM (TEE);  Surgeon: Sanda Klein, MD;  Location: [place];  Service: Cardiovascular;  Laterality: N/A;  . TONSILLECTOMY  1939  . TOTAL HIP ARTHROPLASTY      SOCIAL HISTORY: Social History   Socioeconomic History  . Marital status: Married    Spouse name: Not on file  . Number of children: 3  . Years of education: Not on file  . Highest education level: Not on file  Occupational History  . Occupation: Retired  Scientific laboratory technician  . Financial resource strain: Not on file  . Food insecurity    Worry: Not on file    Inability: Not on file  . Transportation needs    Medical: Not on file    Non-medical: Not on file  Tobacco Use  . Smoking status: Former Smoker    Packs/day: 0.30    Years: 8.00    Pack years: 2.40    Types: Cigarettes    Start date: 03/25/1944    Quit date: 07/27/1954    Years since quitting: 64.9  . Smokeless tobacco: Never Used  Substance and Sexual Activity  . Alcohol use: Yes    Alcohol/week: 0.0 standard drinks    Comment: glass of wine occ  . Drug use: No  . Sexual activity: Not on file   Lifestyle  . Physical activity    Days per week: Not on file    Minutes per session: Not on file  . Stress: Not on file  Relationships  . Social Herbalist on phone: Not on file    Gets together: Not on file    Attends religious service: Not on file    Active member of club or organization: Not on file    Attends meetings of clubs or organizations: Not on file    Relationship status: Not on file  . Intimate partner violence    Fear of current or ex partner: Not on file    Emotionally abused: Not on file    Physically abused: Not on file    Forced sexual activity: Not on file  Other Topics Concern  . Not on file  Social History Narrative  . Not on file    FAMILY HISTORY: Family History  Problem Relation Age of Onset  . Coronary artery disease Father   . Other Father        Heart attack or stroke  . Cancer Father   . Colon cancer Mother   . Diabetes Neg Hx   . Hypertension Neg Hx   . Esophageal cancer Neg Hx   . Rectal cancer Neg Hx   . Stomach cancer Neg Hx     ALLERGIES:  has No Known Allergies.  MEDICATIONS:  Current Outpatient Medications  Medication Sig Dispense Refill  . amiodarone (PACERONE) 200 MG tablet TAKE 0.5 (1/2) TABLETS (100 MG TOTAL) BY MOUTH DAILY. (Patient taking differently: Take 100 mg by mouth every morning. ) 45 tablet 3  . amLODipine (NORVASC) 5 MG tablet Take 5 mg by mouth every morning.   3  . amoxicillin (AMOXIL) 500 MG tablet Take 2,000 mg by mouth See admin instructions. Take four capsules (2000 mg) by mouth one hour prior to dental appointments    . benazepril (LOTENSIN) 40 MG tablet TAKE 1 TABLET BY MOUTH EVERY DAY (Patient taking differently: Take 40 mg by mouth every morning. ) 90 tablet 3  . carvedilol (COREG) 3.125 MG tablet TAKE 1 TABLET (3.125 MG TOTAL) BY MOUTH 2 (TWO) TIMES DAILY. (Patient taking differently: Take 3.125 mg by mouth 2 (two) times daily with a meal. ) 180 tablet 3  .  docusate sodium (COLACE) 100 MG capsule  Take 200-300 mg by mouth at bedtime.     Marland Kitchen levothyroxine (SYNTHROID, LEVOTHROID) 50 MCG tablet Take 50 mcg by mouth daily before breakfast.    . Multiple Vitamin (MULTIVITAMIN WITH MINERALS) TABS tablet Take 0.5 tablets by mouth 2 (two) times daily.    . nitroGLYCERIN (NITROSTAT) 0.4 MG SL tablet Place 1 tablet (0.4 mg total) under the tongue every 5 (five) minutes as needed. Up to 3 doses, if no relief after 3rd dose, proceed to the ED for an evaluation. (Patient taking differently: Place 0.4 mg under the tongue every 5 (five) minutes as needed for chest pain (Up to 3 doses, if no relief after 3rd dose, proceed to the ED for an evaluation.). ) 25 tablet 3  . potassium chloride SA (KLOR-CON M20) 20 MEQ tablet Take 1-2 tablets (20-40 mEq total) by mouth as directed. Take 2 on days with 40 mg torsemide and 1 on days with 20 mg torsemide 60 tablet 6  . pravastatin (PRAVACHOL) 40 MG tablet TAKE 1 TABLET BY MOUTH EVERY EVENING (Patient taking differently: Take 40 mg by mouth daily with supper. ) 90 tablet 3  . Probiotic Product (PROBIOTIC DAILY PO) Take 1 tablet by mouth every morning.     . Psyllium (METAMUCIL PO) Take 5 mLs by mouth every morning. Mix in orange juice    . torsemide (DEMADEX) 20 MG tablet Take 1-2 tablets (20-40 mg total) by mouth as directed. Take 40 mg alternating with 20 mg daily 60 tablet 6  . warfarin (COUMADIN) 2 MG tablet Take 2-3 mg by mouth See admin instructions. Take 1 1/2 tablet (3 mg) by mouth on Sunday morning, take 1 tablet (2 mg) on all other mornings of the week     No current facility-administered medications for this visit.     REVIEW OF SYSTEMS:   A 10+ POINT REVIEW OF SYSTEMS WAS OBTAINED including neurology, dermatology, psychiatry, cardiac, respiratory, lymph, extremities, GI, GU, Musculoskeletal, constitutional, breasts, reproductive, HEENT.  All pertinent positives are noted in the HPI.  All others are negative.   PHYSICAL EXAMINATION:  ECOG PERFORMANCE STATUS:  3 - Symptomatic, >50% confined to bed  . Vitals:   02/21/18 1449  BP: 135/76  Pulse: 60  Resp: 18  Temp: 97.9 F (36.6 C)  SpO2: (!) 90%   Filed Weights   06/28/19 1150  Weight: 154 lb 8 oz (70.1 kg)   .Body mass index is 24.94 kg/m.  GENERAL:alert, in no acute distress and comfortable SKIN: no acute rashes, no significant lesions EYES: conjunctiva are pink and non-injected, sclera anicteric OROPHARYNX: MMM, no exudates, no oropharyngeal erythema or ulceration NECK: supple, no JVD LYMPH:  no palpable lymphadenopathy in the cervical, axillary or inguinal regions LUNGS: clear to auscultation b/l with normal respiratory effort HEART: regular rate & rhythm ABDOMEN:  normoactive bowel sounds , non tender, not distended. No palpable hepatosplenomegaly.  Extremity: trace pedal edema  PSYCH: alert & oriented x 3 with fluent speech NEURO: no focal motor/sensory deficits  LABORATORY DATA:  I have reviewed the data as listed . CBC Latest Ref Rng & Units 06/28/2019 04/26/2019 04/25/2019  WBC 4.0 - 10.5 K/uL 4.7 5.3 4.8  Hemoglobin 13.0 - 17.0 g/dL 8.2(L) 10.8(L) 10.2(L)  Hematocrit 39.0 - 52.0 % 27.2(L) 33.1(L) 31.0(L)  Platelets 150 - 400 K/uL 132(L) 102(L) 87(L)    . CMP Latest Ref Rng & Units 06/28/2019 06/01/2019 04/26/2019  Glucose 70 - 99 mg/dL 106(H) 121(H) 88  BUN 8 - 23 mg/dL 26(H) 29(H) 29(H)  Creatinine 0.61 - 1.24 mg/dL 1.29(H) 1.26(H) 1.20  Sodium 135 - 145 mmol/L 141 142 141  Potassium 3.5 - 5.1 mmol/L 4.0 4.7 3.5  Chloride 98 - 111 mmol/L 102 105 104  CO2 22 - 32 mmol/L 29 32 26  Calcium 8.9 - 10.3 mg/dL 9.2 9.0 9.0  Total Protein 6.5 - 8.1 g/dL 6.9 - -  Total Bilirubin 0.3 - 1.2 mg/dL 0.8 - -  Alkaline Phos 38 - 126 U/L 116 - -  AST 15 - 41 U/L 26 - -  ALT 0 - 44 U/L 18 - -    . Lab Results  Component Value Date   IRON 46 12/27/2018   TIBC 369 12/27/2018   IRONPCTSAT 12 (L) 12/27/2018   (Iron and TIBC)  Lab Results  Component Value Date   FERRITIN  54 06/28/2019   . Lab Results  Component Value Date   IRON 46 12/27/2018   TIBC 369 12/27/2018   IRONPCTSAT 12 (L) 12/27/2018   (Iron and TIBC)  Lab Results  Component Value Date   FERRITIN 54 06/28/2019    EGD 11/12/2015 - Esophageal mucosal changes suggestive of long-segment Barrett's esophagus. Biopsied. - 3 cm hiatal hernia. - Normal stomach. - Normal examined duodenum. Biopsied.  Colonoscopy 11/12/2015 - Two 4 to 5 mm polyps in the cecum, removed with a cold snare. Resected and retrieved. - Two 4 to 5 mm polyps in the ascending colon, removed with a cold snare. Resected and Retrieved - One 14 mm polyp in the ascending colon, removed with a hot snare. Resected and retrieved. Clip was placed. - Internal hemorrhoids.   RADIOGRAPHIC STUDIES: I have personally reviewed the radiological images as listed and agreed with the findings in the report. No results found.  ASSESSMENT & PLAN:   85 y.o.Caucasian male with multiple medical comorbidities with  1) Microcytic Anemia -predominantly related to iron deficiency from chronic GI losses.  Patient has been on chronic anticoagulation for his atrial fibrillation and this will continue to be a risk factor from going GI losses. He has received IV Feraheme 2 in March and July 2017 without complete correction of his iron deficiency. This is suggestive of ongoing GI losses. Initial workup as noted below showed no monoclonal protein. No evidence of hemolysis with normal LDH and haptoglobin. Patient had IV Injectafer 750mg  qweekly x2 doses on 05/22/2016 and 05/29/2016. He has multiple potential sources of GI bleeding especially when on Coumadin.  2) Long segment Barrett's esophagus without dysplasia.  3) Hiatal hernia  4) multiple adenomatous colonic polyps.  PLAN:  -Discussed pt labwork today, 06/28/19; Hgb is down, blood chemistries are stable  -Discussed 06/28/2019 Ferritin is down to 54 -Goal Ferritin >100 -Suspect ongoing  GI blood loss as pt's Ferritin has decreased  -Advised pt that we will likely have to give regular IV Iron infusions as bleeding is exacerbated by blood thinners that are necessary for other medical conditions -Will continue with IV Injectafer if Ferritin <100.  -Continue wearing compresion socks -Will give pt IV Injectafer once weekly x2 -Will see back in 3 months with labs   5). Patient Active Problem List   Diagnosis Date Noted  . Congestive heart failure (CHF) ([place]) 04/21/2019  . History of atrial fibrillation   . Anticoagulation monitoring by pharmacist   . History of prosthetic aortic valve   . Inguinal hernia 08/30/2017  . Bacteremia   . Hypoxia 07/03/2017  . Iron deficiency  anemia due to chronic blood loss 05/14/2016  . Barrett's esophagus without dysplasia 01/22/2016  . Anemia 08/01/2015  . Symptomatic anemia 08/01/2015  . Chronic diastolic CHF (congestive heart failure) ([place]) 08/01/2015  . Sick sinus syndrome ([place]) 03/20/2013  . Pacemaker 11/24/2011  . Warfarin anticoagulation   . Paroxysmal atrial fibrillation (HCC)   . Valvular heart disease   . Atrial flutter ([place])   . Carotid artery disease ([place])   . History of endocarditis   . HYPERLIPIDEMIA 10/10/2010  . OSA on CPAP 11/01/2009  . Hypertensive cardiovascular disease 04/10/2009  . Coronary atherosclerosis of native coronary artery 04/10/2009  . Secondary cardiomyopathy ([place]) 04/10/2009   -continue f/u with cardiology and PCP as per their recommendations.   FOLLOW UP: IV Injectafer weekly x 2 doses RTC with Dr Irene Limbo with labs in 3 months   The total time spent in the appt was 15 minutes and more than 50% was on counseling and direct patient cares.  All of the patient's questions were answered with apparent satisfaction. The patient knows to call the clinic with any problems, questions or concerns.  Sullivan Lone MD [employer] AAHIVMS [facility] Hematology/Oncology Physician [facility]  (Office):        [phone] (Work cell):  [phone] (Fax):           [phone]  I, Yevette Edwards, am acting as a scribe for Dr. Sullivan Lone.   .I have reviewed the above documentation for accuracy and completeness, and I agree with the above. Brunetta Genera MD

## 2019-06-30 NOTE — Progress Notes (Signed)
Remote pacemaker transmission.   

## 2019-07-06 ENCOUNTER — Other Ambulatory Visit: Payer: Self-pay | Admitting: Cardiology

## 2019-07-06 ENCOUNTER — Inpatient Hospital Stay: Payer: Medicare Other

## 2019-07-06 ENCOUNTER — Other Ambulatory Visit: Payer: Self-pay

## 2019-07-06 VITALS — BP 120/70 | HR 75 | Temp 98.8°F | Resp 18

## 2019-07-06 DIAGNOSIS — D500 Iron deficiency anemia secondary to blood loss (chronic): Secondary | ICD-10-CM

## 2019-07-06 DIAGNOSIS — D509 Iron deficiency anemia, unspecified: Secondary | ICD-10-CM | POA: Diagnosis not present

## 2019-07-06 MED ORDER — LORATADINE 10 MG PO TABS
10.0000 mg | ORAL_TABLET | Freq: Once | ORAL | Status: AC
  Administered 2019-07-06: 10 mg via ORAL

## 2019-07-06 MED ORDER — SODIUM CHLORIDE 0.9 % IV SOLN
750.0000 mg | Freq: Once | INTRAVENOUS | Status: AC
  Administered 2019-07-06: 750 mg via INTRAVENOUS
  Filled 2019-07-06: qty 15

## 2019-07-06 MED ORDER — ACETAMINOPHEN 325 MG PO TABS
650.0000 mg | ORAL_TABLET | Freq: Once | ORAL | Status: AC
  Administered 2019-07-06: 15:00:00 650 mg via ORAL

## 2019-07-06 MED ORDER — SODIUM CHLORIDE 0.9 % IV SOLN
Freq: Once | INTRAVENOUS | Status: AC
  Administered 2019-07-06: 15:00:00 via INTRAVENOUS
  Filled 2019-07-06: qty 250

## 2019-07-06 MED ORDER — ACETAMINOPHEN 325 MG PO TABS
ORAL_TABLET | ORAL | Status: AC
  Filled 2019-07-06: qty 2

## 2019-07-06 MED ORDER — LORATADINE 10 MG PO TABS
ORAL_TABLET | ORAL | Status: AC
  Filled 2019-07-06: qty 1

## 2019-07-06 NOTE — Patient Instructions (Signed)
Ferric carboxymaltose injection What is this medicine? FERRIC CARBOXYMALTOSE (ferr-ik car-box-ee-mol-toes) is an iron complex. Iron is used to make healthy red blood cells, which carry oxygen and nutrients throughout the body. This medicine is used to treat anemia in people with chronic kidney disease or people who cannot take iron by mouth. This medicine may be used for other purposes; ask your health care provider or pharmacist if you have questions. COMMON BRAND NAME(S): Injectafer What should I tell my health care provider before I take this medicine? They need to know if you have any of these conditions:  high levels of iron in the blood  liver disease  an unusual or allergic reaction to iron, other medicines, foods, dyes, or preservatives  pregnant or trying to get pregnant  breast-feeding How should I use this medicine? This medicine is for infusion into a vein. It is given by a health care professional in a hospital or clinic setting. Talk to your pediatrician regarding the use of this medicine in children. Special care may be needed. Overdosage: If you think you have taken too much of this medicine contact a poison control center or emergency room at once. NOTE: This medicine is only for you. Do not share this medicine with others. What if I miss a dose? It is important not to miss your dose. Call your doctor or health care professional if you are unable to keep an appointment. What may interact with this medicine? Do not take this medicine with any of the following medications:  deferoxamine  dimercaprol  other iron products This list may not describe all possible interactions. Give your health care provider a list of all the medicines, herbs, non-prescription drugs, or dietary supplements you use. Also tell them if you smoke, drink alcohol, or use illegal drugs. Some items may interact with your medicine. What should I watch for while using this medicine? Visit your  doctor or health care professional regularly. Tell your doctor if your symptoms do not start to get better or if they get worse. You may need blood work done while you are taking this medicine. You may need to follow a special diet. Talk to your doctor. Foods that contain iron include: whole grains/cereals, dried fruits, beans, or peas, leafy green vegetables, and organ meats (liver, kidney). What side effects may I notice from receiving this medicine? Side effects that you should report to your doctor or health care professional as soon as possible:  allergic reactions like skin rash, itching or hives, swelling of the face, lips, or tongue  dizziness  facial flushing Side effects that usually do not require medical attention (report to your doctor or health care professional if they continue or are bothersome):  changes in taste  constipation  headache  nausea, vomiting  pain, redness, or irritation at site where injected This list may not describe all possible side effects. Call your doctor for medical advice about side effects. You may report side effects to FDA at 1-800-FDA-1088. Where should I keep my medicine? This drug is given in a hospital or clinic and will not be stored at home. NOTE: This sheet is a summary. It may not cover all possible information. If you have questions about this medicine, talk to your doctor, pharmacist, or health care provider.  2020 Elsevier/Gold Standard (2016-08-27 09:40:29)  Coronavirus (COVID-19) Are you at risk?  Are you at risk for the Coronavirus (COVID-19)?  To be considered HIGH RISK for Coronavirus (COVID-19), you have to meet the following   criteria:  . Traveled to [place], [place], [place], [place] or [place]; or in the [place] to [place], [place], [place], or [place]; and have fever, cough, and shortness of breath within the last 2 weeks of travel OR . Been in close contact with a person diagnosed with COVID-19 within the  last 2 weeks and have fever, cough, and shortness of breath . IF YOU DO NOT MEET THESE CRITERIA, YOU ARE CONSIDERED LOW RISK FOR COVID-19.  What to do if you are HIGH RISK for COVID-19?  . If you are having a medical emergency, call 911. . Seek medical care right away. Before you go to a doctor's office, urgent care or emergency department, call ahead and tell them about your recent travel, contact with someone diagnosed with COVID-19, and your symptoms. You should receive instructions from your physician's office regarding next steps of care.  . When you arrive at healthcare provider, tell the healthcare staff immediately you have returned from visiting [place], [place], [place], [place] or [place]; or traveled in the [place] to [place], [place], [place], or [place]; in the last two weeks or you have been in close contact with a person diagnosed with COVID-19 in the last 2 weeks.   . Tell the health care staff about your symptoms: fever, cough and shortness of breath. . After you have been seen by a medical provider, you will be either: o Tested for (COVID-19) and discharged home on quarantine except to seek medical care if symptoms worsen, and asked to  - Stay home and avoid contact with others until you get your results (4-5 days)  - Avoid travel on public transportation if possible (such as bus, train, or airplane) or o Sent to the Emergency Department by EMS for evaluation, COVID-19 testing, and possible admission depending on your condition and test results.  What to do if you are LOW RISK for COVID-19?  Reduce your risk of any infection by using the same precautions used for avoiding the common cold or flu:  . Wash your hands often with soap and warm water for at least 20 seconds.  If soap and water are not readily available, use an alcohol-based hand sanitizer with at least 60% alcohol.  . If coughing or sneezing, cover your mouth and nose by coughing or sneezing into the elbow  areas of your shirt or coat, into a tissue or into your sleeve (not your hands). . Avoid shaking hands with others and consider head nods or verbal greetings only. . Avoid touching your eyes, nose, or mouth with unwashed hands.  . Avoid close contact with people who are sick. . Avoid places or events with large numbers of people in one location, like concerts or sporting events. . Carefully consider travel plans you have or are making. . If you are planning any travel outside or inside the [place], visit the CDC's Travelers' Health webpage for the latest health notices. . If you have some symptoms but not all symptoms, continue to monitor at home and seek medical attention if your symptoms worsen. . If you are having a medical emergency, call 911.   ADDITIONAL HEALTHCARE OPTIONS FOR PATIENTS  [facility] / e-Visit: [email or web address]         [facility]: [phone]  [facility]: [phone]                   [facility]: [phone]   

## 2019-07-13 ENCOUNTER — Inpatient Hospital Stay: Payer: Medicare Other

## 2019-07-13 ENCOUNTER — Other Ambulatory Visit: Payer: Self-pay

## 2019-07-13 VITALS — BP 119/69 | HR 75 | Temp 98.0°F | Resp 16

## 2019-07-13 DIAGNOSIS — D500 Iron deficiency anemia secondary to blood loss (chronic): Secondary | ICD-10-CM

## 2019-07-13 DIAGNOSIS — D509 Iron deficiency anemia, unspecified: Secondary | ICD-10-CM | POA: Diagnosis not present

## 2019-07-13 MED ORDER — LORATADINE 10 MG PO TABS
ORAL_TABLET | ORAL | Status: AC
  Filled 2019-07-13: qty 1

## 2019-07-13 MED ORDER — ACETAMINOPHEN 325 MG PO TABS
ORAL_TABLET | ORAL | Status: AC
  Filled 2019-07-13: qty 2

## 2019-07-13 MED ORDER — LORATADINE 10 MG PO TABS
10.0000 mg | ORAL_TABLET | Freq: Once | ORAL | Status: AC
  Administered 2019-07-13: 15:00:00 10 mg via ORAL

## 2019-07-13 MED ORDER — SODIUM CHLORIDE 0.9 % IV SOLN
Freq: Once | INTRAVENOUS | Status: AC
  Filled 2019-07-13: qty 250

## 2019-07-13 MED ORDER — ACETAMINOPHEN 325 MG PO TABS
650.0000 mg | ORAL_TABLET | Freq: Once | ORAL | Status: AC
  Administered 2019-07-13: 650 mg via ORAL

## 2019-07-13 MED ORDER — SODIUM CHLORIDE 0.9 % IV SOLN
750.0000 mg | Freq: Once | INTRAVENOUS | Status: AC
  Administered 2019-07-13: 750 mg via INTRAVENOUS
  Filled 2019-07-13: qty 15

## 2019-07-13 NOTE — Patient Instructions (Signed)
Ferric carboxymaltose injection What is this medicine? FERRIC CARBOXYMALTOSE (ferr-ik car-box-ee-mol-toes) is an iron complex. Iron is used to make healthy red blood cells, which carry oxygen and nutrients throughout the body. This medicine is used to treat anemia in people with chronic kidney disease or people who cannot take iron by mouth. This medicine may be used for other purposes; ask your health care provider or pharmacist if you have questions. COMMON BRAND NAME(S): Injectafer What should I tell my health care provider before I take this medicine? They need to know if you have any of these conditions:  high levels of iron in the blood  liver disease  an unusual or allergic reaction to iron, other medicines, foods, dyes, or preservatives  pregnant or trying to get pregnant  breast-feeding How should I use this medicine? This medicine is for infusion into a vein. It is given by a health care professional in a hospital or clinic setting. Talk to your pediatrician regarding the use of this medicine in children. Special care may be needed. Overdosage: If you think you have taken too much of this medicine contact a poison control center or emergency room at once. NOTE: This medicine is only for you. Do not share this medicine with others. What if I miss a dose? It is important not to miss your dose. Call your doctor or health care professional if you are unable to keep an appointment. What may interact with this medicine? Do not take this medicine with any of the following medications:  deferoxamine  dimercaprol  other iron products This list may not describe all possible interactions. Give your health care provider a list of all the medicines, herbs, non-prescription drugs, or dietary supplements you use. Also tell them if you smoke, drink alcohol, or use illegal drugs. Some items may interact with your medicine. What should I watch for while using this medicine? Visit your  doctor or health care professional regularly. Tell your doctor if your symptoms do not start to get better or if they get worse. You may need blood work done while you are taking this medicine. You may need to follow a special diet. Talk to your doctor. Foods that contain iron include: whole grains/cereals, dried fruits, beans, or peas, leafy green vegetables, and organ meats (liver, kidney). What side effects may I notice from receiving this medicine? Side effects that you should report to your doctor or health care professional as soon as possible:  allergic reactions like skin rash, itching or hives, swelling of the face, lips, or tongue  dizziness  facial flushing Side effects that usually do not require medical attention (report to your doctor or health care professional if they continue or are bothersome):  changes in taste  constipation  headache  nausea, vomiting  pain, redness, or irritation at site where injected This list may not describe all possible side effects. Call your doctor for medical advice about side effects. You may report side effects to FDA at 1-800-FDA-1088. Where should I keep my medicine? This drug is given in a hospital or clinic and will not be stored at home. NOTE: This sheet is a summary. It may not cover all possible information. If you have questions about this medicine, talk to your doctor, pharmacist, or health care provider.  2020 Elsevier/Gold Standard (2016-08-27 09:40:29)  Coronavirus (COVID-19) Are you at risk?  Are you at risk for the Coronavirus (COVID-19)?  To be considered HIGH RISK for Coronavirus (COVID-19), you have to meet the following   criteria:  . Traveled to [place], [place], [place], [place] or [place]; or in the [place] to [place], [place], [place], or [place]; and have fever, cough, and shortness of breath within the last 2 weeks of travel OR . Been in close contact with a person diagnosed with COVID-19 within the  last 2 weeks and have fever, cough, and shortness of breath . IF YOU DO NOT MEET THESE CRITERIA, YOU ARE CONSIDERED LOW RISK FOR COVID-19.  What to do if you are HIGH RISK for COVID-19?  . If you are having a medical emergency, call 911. . Seek medical care right away. Before you go to a doctor's office, urgent care or emergency department, call ahead and tell them about your recent travel, contact with someone diagnosed with COVID-19, and your symptoms. You should receive instructions from your physician's office regarding next steps of care.  . When you arrive at healthcare provider, tell the healthcare staff immediately you have returned from visiting [place], [place], [place], [place] or [place]; or traveled in the [place] to [place], [place], [place], or [place]; in the last two weeks or you have been in close contact with a person diagnosed with COVID-19 in the last 2 weeks.   . Tell the health care staff about your symptoms: fever, cough and shortness of breath. . After you have been seen by a medical provider, you will be either: o Tested for (COVID-19) and discharged home on quarantine except to seek medical care if symptoms worsen, and asked to  - Stay home and avoid contact with others until you get your results (4-5 days)  - Avoid travel on public transportation if possible (such as bus, train, or airplane) or o Sent to the Emergency Department by EMS for evaluation, COVID-19 testing, and possible admission depending on your condition and test results.  What to do if you are LOW RISK for COVID-19?  Reduce your risk of any infection by using the same precautions used for avoiding the common cold or flu:  . Wash your hands often with soap and warm water for at least 20 seconds.  If soap and water are not readily available, use an alcohol-based hand sanitizer with at least 60% alcohol.  . If coughing or sneezing, cover your mouth and nose by coughing or sneezing into the elbow  areas of your shirt or coat, into a tissue or into your sleeve (not your hands). . Avoid shaking hands with others and consider head nods or verbal greetings only. . Avoid touching your eyes, nose, or mouth with unwashed hands.  . Avoid close contact with people who are sick. . Avoid places or events with large numbers of people in one location, like concerts or sporting events. . Carefully consider travel plans you have or are making. . If you are planning any travel outside or inside the [place], visit the CDC's Travelers' Health webpage for the latest health notices. . If you have some symptoms but not all symptoms, continue to monitor at home and seek medical attention if your symptoms worsen. . If you are having a medical emergency, call 911.   ADDITIONAL HEALTHCARE OPTIONS FOR PATIENTS  [facility] / e-Visit: [email or web address]         [facility]: [phone]  [facility]: [phone]                   [facility]: [phone]   

## 2019-07-17 ENCOUNTER — Other Ambulatory Visit: Payer: Self-pay | Admitting: *Deleted

## 2019-07-17 MED ORDER — BENAZEPRIL HCL 40 MG PO TABS: 40.0000 mg | ORAL_TABLET | Freq: Every day | ORAL | 1 refills | Status: DC

## 2019-08-13 IMAGING — DX DG CHEST 1V PORT
1 series · 1 of 1 positions shown · non-contrast
Comparison: Chest radiograph dated 08/02/2015

CLINICAL DATA: 88-year-old male with hypoxia.

EXAM:
PORTABLE CHEST 1 VIEW

[chest ap]
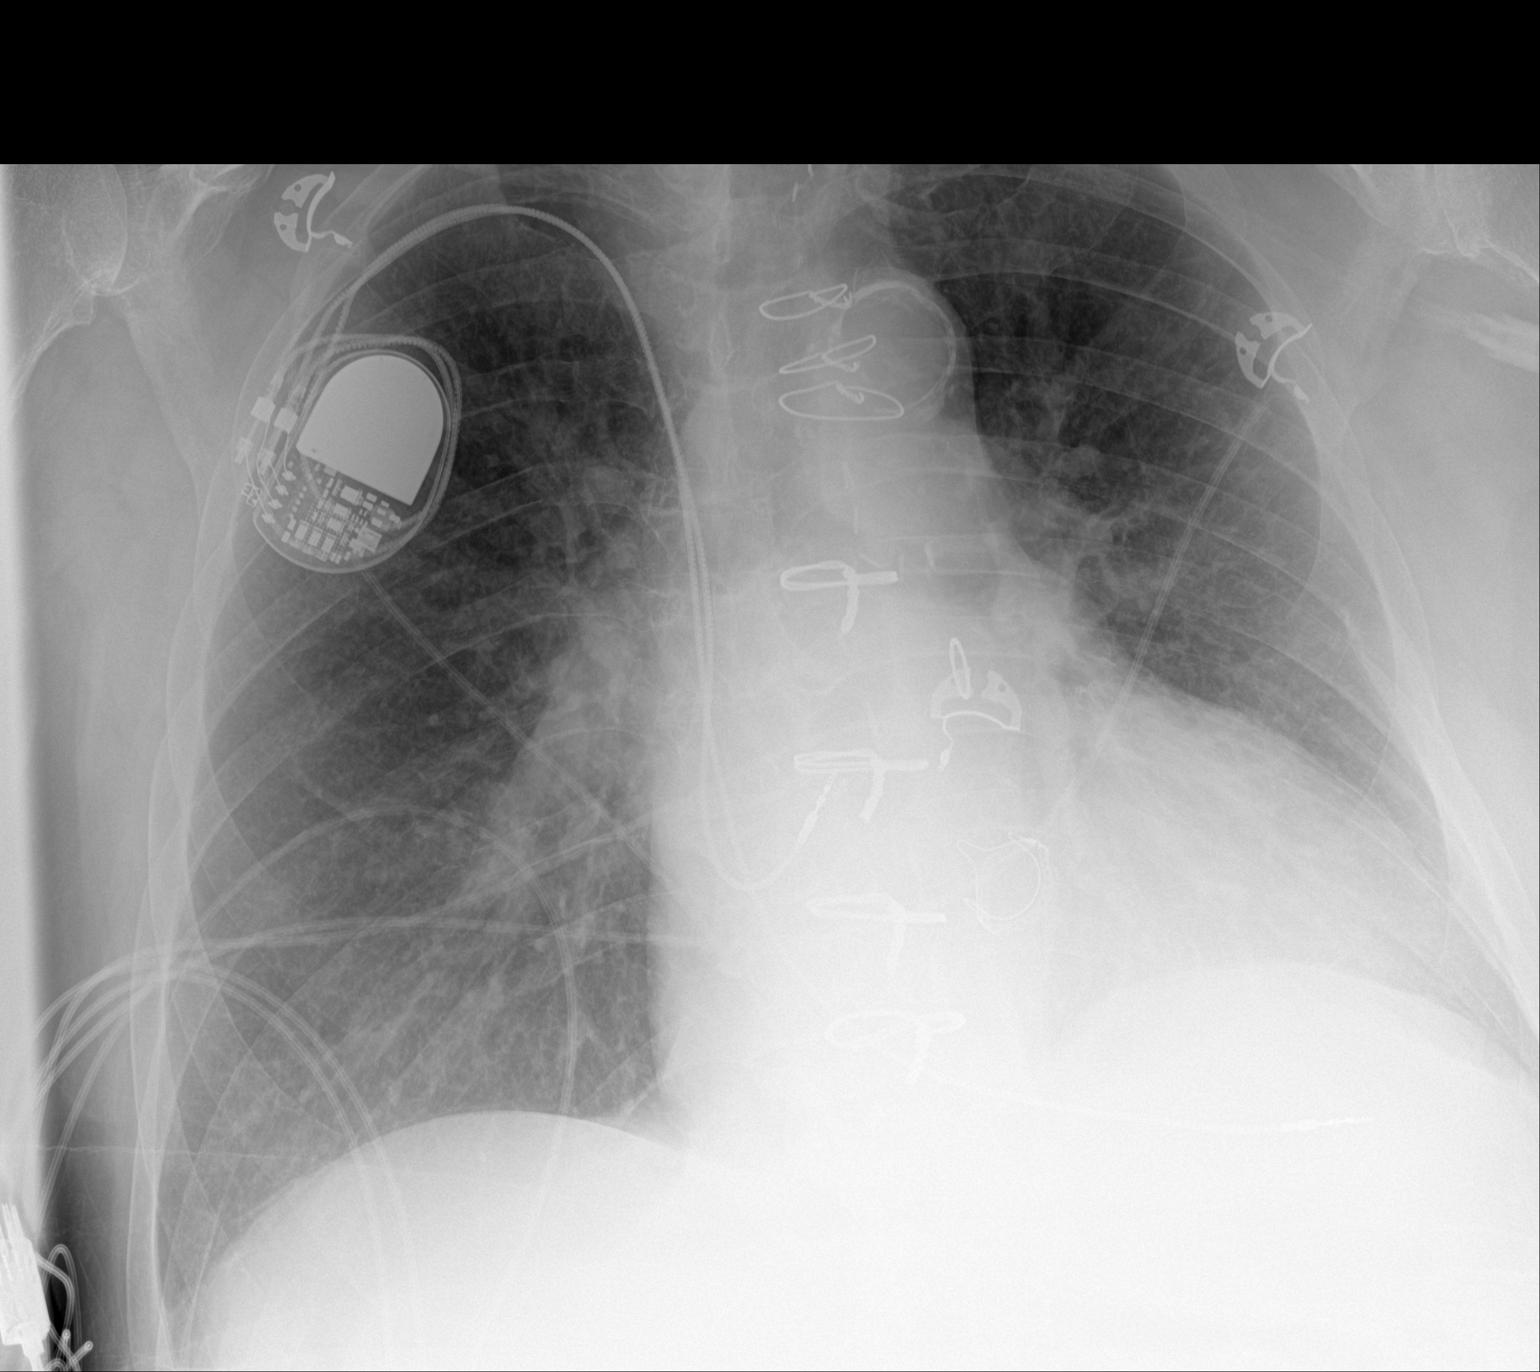

[1 of 1 positions shown; findings below may reference images not displayed]

FINDINGS: Minimal left lung base atelectatic changes. There is no focal
consolidation, pleural effusion or pneumothorax. The left
costophrenic angle has been excluded from the image. There is stable
cardiomegaly. Median sternotomy wires and mechanical cardiac valve.
Atherosclerotic calcification of the aortic arch. No acute osseous
pathology.
IMPRESSION: 1. No acute cardiopulmonary process.
2. Stable cardiomegaly.

## 2019-09-04 ENCOUNTER — Other Ambulatory Visit: Payer: Self-pay | Admitting: Internal Medicine

## 2019-09-06 ENCOUNTER — Other Ambulatory Visit: Payer: Self-pay | Admitting: Cardiology

## 2019-09-07 ENCOUNTER — Telehealth: Payer: Self-pay

## 2019-09-07 NOTE — Telephone Encounter (Signed)
I called [employer] tech support to help the pt transmit. [employer] is sending the pt a new monitor. They should receive the new monitor in 7 to 10 business days.

## 2019-09-08 LAB — BASIC METABOLIC PANEL WITH GFR
BUN: 25 mg/dL (ref 7–25)
CO2: 32 mmol/L (ref 20–32)
Calcium: 9.4 mg/dL (ref 8.6–10.3)
Chloride: 105 mmol/L (ref 98–110)
Creat: 1.09 mg/dL (ref 0.70–1.11)
GFR, Est African American: 70 mL/min/{1.73_m2} (ref >=60)
GFR, Est Non African American: 60 mL/min/{1.73_m2} (ref >=60)
Glucose, Bld: 110 mg/dL — ABNORMAL HIGH (ref 65–99)
Potassium: 4.2 mmol/L (ref 3.5–5.3)
Sodium: 143 mmol/L (ref 135–146)

## 2019-09-08 LAB — HOUSE ACCOUNT TRACKING

## 2019-09-08 LAB — CLIENT EDUCATION TRACKING

## 2019-09-12 ENCOUNTER — Telehealth: Payer: Self-pay | Admitting: *Deleted

## 2019-09-12 NOTE — Telephone Encounter (Signed)
-----   Message from Satira Sark, MD sent at 09/08/2019  3:39 PM EST ----- Results reviewed.  Follow-up lab work shows creatinine 1.09 which has actually improved from prior testing and normal potassium of 4.2.  No change in current plan.

## 2019-09-12 NOTE — Telephone Encounter (Signed)
Wife informed. Copy sent to PCP 

## 2019-09-15 ENCOUNTER — Ambulatory Visit (INDEPENDENT_AMBULATORY_CARE_PROVIDER_SITE_OTHER): Payer: Medicare Other | Admitting: *Deleted

## 2019-09-15 ENCOUNTER — Telehealth: Payer: Self-pay | Admitting: Internal Medicine

## 2019-09-15 DIAGNOSIS — Z950 Presence of cardiac pacemaker: Secondary | ICD-10-CM

## 2019-09-15 NOTE — Telephone Encounter (Signed)
Per pt's wife call needs help setting up remote reader and returning the old one please give her a call back.

## 2019-09-15 NOTE — Telephone Encounter (Signed)
The pt received his new home remote monitor and I help him send a transmission. Transmission received. The pt wife thanked me for my help.

## 2019-09-16 LAB — CUP PACEART REMOTE DEVICE CHECK
Battery Impedance: 3408 Ohm
Battery Remaining Longevity: 12 mo
Battery Voltage: 2.7 V
Brady Statistic AP VP Percent: 99 %
Brady Statistic AP VS Percent: 0 %
Brady Statistic AS VP Percent: 0 %
Brady Statistic AS VS Percent: 0 %
Date Time Interrogation Session: 20210219151350
Implantable Lead Implant Date: 20120815
Implantable Lead Implant Date: 20120815
Implantable Lead Location: 753859
Implantable Lead Location: 753860
Implantable Lead Model: 5076
Implantable Lead Model: 5076
Implantable Pulse Generator Implant Date: 20120815
Lead Channel Impedance Value: 446 Ohm
Lead Channel Impedance Value: 478 Ohm
Lead Channel Pacing Threshold Amplitude: 0.875 V
Lead Channel Pacing Threshold Pulse Width: 0.4 ms
Lead Channel Setting Pacing Amplitude: 2.5 V
Lead Channel Setting Pacing Amplitude: 2.5 V
Lead Channel Setting Pacing Pulse Width: 0.4 ms
Lead Channel Setting Sensing Sensitivity: 2 mV

## 2019-09-21 ENCOUNTER — Ambulatory Visit: Payer: Medicare Other | Admitting: Cardiology

## 2019-09-22 ENCOUNTER — Ambulatory Visit: Payer: Medicare Other | Admitting: Cardiology

## 2019-09-25 NOTE — Progress Notes (Signed)
Marland Kitchen    HEMATOLOGY/ONCOLOGY CLINIC NOTE  Date of Service: 09/26/19   Patient Care Team: Eber Hong, MD as PCP - General (Internal Medicine) Satira Sark, MD as PCP - Cardiology (Cardiology) Thompson Grayer, MD as PCP - Electrophysiology (Cardiology) Zenovia Jarred MD (Gastroenterology)  CHIEF COMPLAINTS F/u for iron deficiency anemia  HISTORY OF PRESENTING ILLNESS:   [name] is a wonderful 86 y.o. male who has been referred to [place] by Dr .Eber Hong, MD  for evaluation and management of Iron deficiency Anemia  He has a history of multiple medical comorbidities including coronary artery disease, chronic systolic CHF, atrial fibrillation on Coumadin, peripheral vascular disease, permanent pacemaker placement, adenomatous colonic polyps and long segment Barrett's esophagus without significant dysplasia on chronic PPI therapy.  Patient has been noting increasing fatigue from earlier this year and was noted to have anemia with hemoglobin of 7.8 with an MCV of 69 in January 2017. Ferritin level was noted to be 19 with an iron saturation of 3%. Fecal occult blood testing in March was noted to be positive for occult GI bleeding.  Patient received IV Feraheme in March and July 2017 for iron replacement. He had an upper endoscopy and colonoscopy which were performed on 11/12/2015. EGD revealed 4 cm of Barrett's esophagus which was biopsied and found to be Barrett's without dysplasia. There was a 3 cm hiatal hernia. Normal stomach and normal duodenum. Duodenal biopsies were negative for celiac. Colonoscopy performed on the same day revealed 2 cecal polyps. 3 ascending colon polyps one of which was 14 mm and removed with hot snare. It was prophylactically endoclipped. The remainder of the examination was unremarkable. Internal hemorrhoids were seen. These polyps were found to be adenomas without high-grade dysplasia. Has not had a capsule endoscopy.  Patient's hemoglobin levels have been  somewhat better in the mid to high 9 range but with persistent iron deficiency and lack of normalization of his anemia.  He has been referred to [place] for further management of his iron deficiency anemia. He continues to be on Coumadin for his atrial fibrillation. He has been on oral iron preparation which is causing some constipation and darker stools. Reports no overt GI bleeding. His last INR was 2.3 yesterday as per his report. He monitors this with his primary care physician. Notes persistent fatigue which is likely multifactorial.  Is on levothyroxine replacement for hypothyroidism. Takes a daily multivitamin.  Patient reports that he notes his weight closely to avoid fluid overload from CHF and notes no weight loss.  No other evidence of overt bleeding. No hematuria no gum bleeds no epistaxis. Notes easy skin bruising. Not using any over-the-counter NSAIDs.   INTERVAL HISTORY   [name] Is here today for management and evaluation for Iron deficiency anemia. We are joined today by his wife. The patient's last visit with [place] was on 06/28/2019. The pt reports that he is doing well overall.  The pt reports that he has dark stools 1-2 times per month. He denies any bloody stools nd has not noticed any bleeding from his hemorrhoids. Pt has continued to take his Coumadin as prescribed and his INR levels have remained stable. He had nosebleeds previously and needed a nasal cauterization which stopped the bleeding for awhile. His nose has recently began bleeding again and he has been using a nasal saline spray which is keeping the bleeding to a minimum but he is still experiencing it daily.   Pt has had the first dose of the  COVID19 vaccine and has scheduled the second dose for the end of March.   Lab results today (09/26/19) of CBC w/diff and CMP is as follows: all values are WNL except for RBC at 3.50, Hgb at 10.0, HCT at 32.4, RDW at 19.3, PLT at 110K, Lymphs Abs at 0.6K, Sodium at 147,  Glucose at 111, BUN at 28, Creatinine at 1.25, ALP at 144, GFR Est Non Af Am at 51. 09/26/2019 Ferritin is in progress  On review of systems, pt reports dark stools, nose bleeds and denies trouble swallowing, bloody stools, hemorrhoidal bleeding and any other symptoms.   MEDICAL HISTORY:  Past Medical History:  Diagnosis Date  . Anemia   . Arthritis   . Atrial flutter (HCC)    Amiodarone therapy, coumadin therapy, prior history of cardioversion and overdrive pacing  . Barrett's esophagus   . Benign prostatic hyperplasia   . Cardiomyopathy    Mixed ischemic and nonischemic with previous EF of 35-40%, most recently in June 2009 normalized to 55-60%. Echo 2/11 EF 55-60%  . Carotid artery disease (HCC)    Less than 50% bilateral internal carotid artery stenosis by Doppler 2009  . Carpal tunnel syndrome   . Chronic diastolic (congestive) heart failure ([place])   . Coronary atherosclerosis of native coronary artery    Status post coronary bypass grafting  . 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 Endocarditis   . Essential hypertension, benign   . Ganglion cyst    right wrist  . GERD (gastroesophageal reflux disease)    PMH  . Hiatal hernia   . HOH (hard of hearing)   . Hyperlipidemia   . Hypothyroidism   . Inguinal hernia    Left  . Internal hemorrhoids   . Iron deficiency anemia   . Obstructive sleep apnea   . Osteoporosis   . Paroxysmal atrial fibrillation (HCC)    Amiodarone therapy, status post Cox-Maze procedure but with recurrent atrial fibrillation while on amiodarone August 2012  . Presence of permanent cardiac pacemaker   . Symptomatic bradycardia   . Tubular adenoma of colon   . 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 Warfarin anticoagulation   . Wears glasses   . Wears partial dentures     SURGICAL HISTORY: Past Surgical History:  Procedure Laterality  Date  . AORTIC VALVE REPLACEMENT    . CARPAL TUNNEL RELEASE  2009  . CATARACT EXTRACTION, BILATERAL  07/2011  . COLONOSCOPY W/ BIOPSIES AND POLYPECTOMY    . CORONARY ARTERY BYPASS GRAFT  2008   x2  . HERNIA REPAIR  age 83  . INGUINAL HERNIA REPAIR Left 08/30/2017   Procedure: LEFT INGUINAL HERNIA REPAIR;  Surgeon: Rolm Bookbinder, MD;  Location: [facility];  Service: General;  Laterality: Left;  GENERAL ETT AND TAP BLOCK  . INSERTION OF MESH Left 08/30/2017   Procedure: INSERTION OF MESH;  Surgeon: Rolm Bookbinder, MD;  Location: [facility];  Service: General;  Laterality: Left;  GENERAL ETT AND TAP BLOCK  . KNEE ARTHROSCOPY    . MASS EXCISION Right 01/26/2018   Procedure: Right wrist dorsal mass excision;  Surgeon: Iran Planas, MD;  Location: [facility];  Service: Orthopedics;  Laterality: Right;  60 mins  . PACEMAKER INSERTION  2009   Initially implanted at [facility], extracted by Dr Lovena Le for endocarditis and reimplanted 8/12  . PATELLA FRACTURE SURGERY  age 86  screw placed and removed  . TEE WITHOUT CARDIOVERSION N/A 07/05/2017   Procedure: TRANSESOPHAGEAL ECHOCARDIOGRAM (TEE);  Surgeon: Sanda Klein, MD;  Location: [facility];  Service: Cardiovascular;  Laterality: N/A;  . TONSILLECTOMY  1939  . TOTAL HIP ARTHROPLASTY      SOCIAL HISTORY: Social History   Socioeconomic History  . Marital status: Married    Spouse name: Not on file  . Number of children: 3  . Years of education: Not on file  . Highest education level: Not on file  Occupational History  . Occupation: Retired  Tobacco Use  . Smoking status: Former Smoker    Packs/day: 0.30    Years: 8.00    Pack years: 2.40    Types: Cigarettes    Start date: 03/25/1944    Quit date: 07/27/1954    Years since quitting: 65.2  . Smokeless tobacco: Never Used  Substance and Sexual Activity  . Alcohol use: Yes    Alcohol/week: 0.0 standard drinks    Comment: glass of wine occ  . Drug use: No  . Sexual activity: Not on file  Other  Topics Concern  . Not on file  Social History Narrative  . Not on file   Social Determinants of Health   Financial Resource Strain:   . Difficulty of Paying Living Expenses: Not on file  Food Insecurity:   . Worried About Charity fundraiser in the Last Year: Not on file  . Ran Out of Food in the Last Year: Not on file  Transportation Needs:   . Lack of Transportation (Medical): Not on file  . Lack of Transportation (Non-Medical): Not on file  Physical Activity:   . Days of Exercise per Week: Not on file  . Minutes of Exercise per Session: Not on file  Stress:   . Feeling of Stress : Not on file  Social Connections:   . Frequency of Communication with Friends and Family: Not on file  . Frequency of Social Gatherings with Friends and Family: Not on file  . Attends Religious Services: Not on file  . Active Member of Clubs or Organizations: Not on file  . Attends Archivist Meetings: Not on file  . Marital Status: Not on file  Intimate Partner Violence:   . Fear of Current or Ex-Partner: Not on file  . Emotionally Abused: Not on file  . Physically Abused: Not on file  . Sexually Abused: Not on file    FAMILY HISTORY: Family History  Problem Relation Age of Onset  . Coronary artery disease Father   . Other Father        Heart attack or stroke  . Cancer Father   . Colon cancer Mother   . Diabetes Neg Hx   . Hypertension Neg Hx   . Esophageal cancer Neg Hx   . Rectal cancer Neg Hx   . Stomach cancer Neg Hx     ALLERGIES:  has No Known Allergies.  MEDICATIONS:  Current Outpatient Medications  Medication Sig Dispense Refill  . amiodarone (PACERONE) 200 MG tablet TAKE 1/2 TABLET BY MOUTH EVERY DAY 45 tablet 2  . amLODipine (NORVASC) 5 MG tablet Take 5 mg by mouth every morning.   3  . amoxicillin (AMOXIL) 500 MG tablet Take 2,000 mg by mouth See admin instructions. Take four capsules (2000 mg) by mouth one hour prior to dental appointments    . benazepril  (LOTENSIN) 40 MG tablet Take 1 tablet (40 mg total) by mouth  daily. 90 tablet 1  . carvedilol (COREG) 3.125 MG tablet Take 1 tablet (3.125 mg total) by mouth 2 (two) times daily with a meal. 180 tablet 1  . docusate sodium (COLACE) 100 MG capsule Take 200-300 mg by mouth at bedtime.     Marland Kitchen levothyroxine (SYNTHROID, LEVOTHROID) 50 MCG tablet Take 50 mcg by mouth daily before breakfast.    . Multiple Vitamin (MULTIVITAMIN WITH MINERALS) TABS tablet Take 0.5 tablets by mouth 2 (two) times daily.    . nitroGLYCERIN (NITROSTAT) 0.4 MG SL tablet Place 1 tablet (0.4 mg total) under the tongue every 5 (five) minutes as needed. Up to 3 doses, if no relief after 3rd dose, proceed to the ED for an evaluation. (Patient taking differently: Place 0.4 mg under the tongue every 5 (five) minutes as needed for chest pain (Up to 3 doses, if no relief after 3rd dose, proceed to the ED for an evaluation.). ) 25 tablet 3  . potassium chloride SA (KLOR-CON M20) 20 MEQ tablet Take 1-2 tablets (20-40 mEq total) by mouth as directed. Take 2 on days with 40 mg torsemide and 1 on days with 20 mg torsemide 60 tablet 6  . pravastatin (PRAVACHOL) 40 MG tablet Take 1 tablet (40 mg total) by mouth daily with supper. 90 tablet 1  . Probiotic Product (PROBIOTIC DAILY PO) Take 1 tablet by mouth every morning.     . Psyllium (METAMUCIL PO) Take 5 mLs by mouth every morning. Mix in orange juice    . torsemide (DEMADEX) 20 MG tablet Take 1-2 tablets (20-40 mg total) by mouth as directed. Take 40 mg alternating with 20 mg daily 60 tablet 6  . warfarin (COUMADIN) 2 MG tablet Take 2-3 mg by mouth See admin instructions. Take 1 1/2 tablet (3 mg) by mouth on Sunday morning, take 1 tablet (2 mg) on all other mornings of the week     No current facility-administered medications for this visit.    REVIEW OF SYSTEMS:   A 10+ POINT REVIEW OF SYSTEMS WAS OBTAINED including neurology, dermatology, psychiatry, cardiac, respiratory, lymph, extremities,  GI, GU, Musculoskeletal, constitutional, breasts, reproductive, HEENT.  All pertinent positives are noted in the HPI.  All others are negative.   PHYSICAL EXAMINATION:  ECOG PERFORMANCE STATUS: 3 - Symptomatic, >50% confined to bed  . Vitals:   02/21/18 1449  BP: 135/76  Pulse: 60  Resp: 18  Temp: 97.9 F (36.6 C)  SpO2: (!) 90%   Filed Weights   09/26/19 1430  Weight: 152 lb (68.9 kg)   .Body mass index is 24.53 kg/m.  Exam was given in a chair   GENERAL:alert, in no acute distress and comfortable SKIN: no acute rashes, no significant lesions EYES: conjunctiva are pink and non-injected, sclera anicteric OROPHARYNX: MMM, no exudates, no oropharyngeal erythema or ulceration NECK: supple, no JVD LYMPH:  no palpable lymphadenopathy in the cervical, axillary or inguinal regions LUNGS: clear to auscultation b/l with normal respiratory effort HEART: regular rate & rhythm ABDOMEN:  normoactive bowel sounds , non tender, not distended. No palpable hepatosplenomegaly.  Extremity: trace pedal edema b/l PSYCH: alert & oriented x 3 with fluent speech NEURO: no focal motor/sensory deficits  LABORATORY DATA:  I have reviewed the data as listed . CBC Latest Ref Rng & Units 09/26/2019 06/28/2019 04/26/2019  WBC 4.0 - 10.5 K/uL 4.6 4.7 5.3  Hemoglobin 13.0 - 17.0 g/dL 10.0(L) 8.2(L) 10.8(L)  Hematocrit 39.0 - 52.0 % 32.4(L) 27.2(L) 33.1(L)  Platelets 150 -  400 K/uL 110(L) 132(L) 102(L)    . CMP Latest Ref Rng & Units 09/26/2019 09/06/2019 06/28/2019  Glucose 70 - 99 mg/dL 111(H) 110(H) 106(H)  BUN 8 - 23 mg/dL 28(H) 25 26(H)  Creatinine 0.61 - 1.24 mg/dL 1.25(H) 1.09 1.29(H)  Sodium 135 - 145 mmol/L 147(H) 143 141  Potassium 3.5 - 5.1 mmol/L 4.3 4.2 4.0  Chloride 98 - 111 mmol/L 109 105 102  CO2 22 - 32 mmol/L 29 32 29  Calcium 8.9 - 10.3 mg/dL 9.3 9.4 9.2  Total Protein 6.5 - 8.1 g/dL 7.0 - 6.9  Total Bilirubin 0.3 - 1.2 mg/dL 0.8 - 0.8  Alkaline Phos 38 - 126 U/L 144(H) - 116    AST 15 - 41 U/L 32 - 26  ALT 0 - 44 U/L 25 - 18    . Lab Results  Component Value Date   IRON 46 12/27/2018   TIBC 369 12/27/2018   IRONPCTSAT 12 (L) 12/27/2018   (Iron and TIBC)  Lab Results  Component Value Date   FERRITIN 54 06/28/2019   . Lab Results  Component Value Date   IRON 46 12/27/2018   TIBC 369 12/27/2018   IRONPCTSAT 12 (L) 12/27/2018   (Iron and TIBC)  Lab Results  Component Value Date   FERRITIN 54 06/28/2019    EGD 11/12/2015 - Esophageal mucosal changes suggestive of long-segment Barrett's esophagus. Biopsied. - 3 cm hiatal hernia. - Normal stomach. - Normal examined duodenum. Biopsied.  Colonoscopy 11/12/2015 - Two 4 to 5 mm polyps in the cecum, removed with a cold snare. Resected and retrieved. - Two 4 to 5 mm polyps in the ascending colon, removed with a cold snare. Resected and Retrieved - One 14 mm polyp in the ascending colon, removed with a hot snare. Resected and retrieved. Clip was placed. - Internal hemorrhoids.   RADIOGRAPHIC STUDIES: I have personally reviewed the radiological images as listed and agreed with the findings in the report. CUP PACEART REMOTE DEVICE CHECK  Result Date: 09/16/2019 Scheduled pacemaker remote.  Normal device function.  Estimated longevity 12 months. 5 AHR episodes - longest 6 seconds, markers only. Routed to EM for longevity. [place]   ASSESSMENT & PLAN:   86 y.o.Caucasian male with multiple medical comorbidities with  1) Microcytic Anemia -predominantly related to iron deficiency from chronic GI losses.  Patient has been on chronic anticoagulation for his atrial fibrillation and this will continue to be a risk factor from going GI losses. He has received IV Feraheme 2 in March and July 2017 without complete correction of his iron deficiency. This is suggestive of ongoing GI losses. Initial workup as noted below showed no monoclonal protein. No evidence of hemolysis with normal LDH and  haptoglobin. Patient had IV Injectafer 750mg  qweekly x2 doses on 05/22/2016 and 05/29/2016. He has multiple potential sources of GI bleeding especially when on Coumadin.  2) Long segment Barrett's esophagus without dysplasia.  3) Hiatal hernia  4) multiple adenomatous colonic polyps.  PLAN:  -Discussed pt labwork today, 09/26/19; Hgb has improved, sodium is elevate, other blood counts and chemistries are steady - pt may be dehydrated -Discussed 09/26/2019 Ferritin is in progress, Goal Ferritin <100   -Suspect ongoing GI blood loss - dark stools would suggest nose bleeds that are passing through the esophagus or an upper GI bleed -Recommend pt use warm water humidifier at home and a humidifier with his oxygen pump to help with nasal dryness -If nose bleeding continues despite changes and saline spray  recommend pt see and ENT -If blood counts continue to drop would recommend pt f/u with Dr. Zenovia Jarred to r/o GI bleeding  -Recommend pt f/u as scheduled for the second dose of the COVID19 vaccine -Will continue to give regular IV Iron infusions as bleeding is exacerbated by blood thinners that are necessary for other medical conditions -Will continue with IV Injectafer if Ferritin <100.  -Will give IV Iron once weekly x2  -Will see back in 4 months with labs  5). Patient Active Problem List   Diagnosis Date Noted  . Congestive heart failure (CHF) ([place]) 04/21/2019  . History of atrial fibrillation   . Anticoagulation monitoring by pharmacist   . History of prosthetic aortic valve   . Inguinal hernia 08/30/2017  . Bacteremia   . Hypoxia 07/03/2017  . Iron deficiency anemia due to chronic blood loss 05/14/2016  . Barrett's esophagus without dysplasia 01/22/2016  . Anemia 08/01/2015  . Symptomatic anemia 08/01/2015  . Chronic diastolic CHF (congestive heart failure) ([place]) 08/01/2015  . Sick sinus syndrome ([place]) 03/20/2013  . Pacemaker 11/24/2011  . Warfarin anticoagulation   .  Paroxysmal atrial fibrillation (HCC)   . Valvular heart disease   . Atrial flutter ([place])   . Carotid artery disease ([place])   . History of endocarditis   . HYPERLIPIDEMIA 10/10/2010  . OSA on CPAP 11/01/2009  . Hypertensive cardiovascular disease 04/10/2009  . Coronary atherosclerosis of native coronary artery 04/10/2009  . Secondary cardiomyopathy ([place]) 04/10/2009   -continue f/u with cardiology and PCP as per their recommendations.   FOLLOW UP: Please schedule IV Injectafer weekly x 2 doses RTC with Dr Irene Limbo with labs in 4 months  The total time spent in the appt was 15 minutes and more than 50% was on counseling and direct patient cares.  All of the patient's questions were answered with apparent satisfaction. The patient knows to call the clinic with any problems, questions or concerns.   Sullivan Lone MD [place] AAHIVMS [facility] Hematology/Oncology Physician [facility]  (Office):       [phone] (Work cell):  [phone] (Fax):           [phone]  I, Yevette Edwards, am acting as a scribe for Dr. Sullivan Lone.   .I have reviewed the above documentation for accuracy and completeness, and I agree with the above. Brunetta Genera MD

## 2019-09-26 ENCOUNTER — Other Ambulatory Visit: Payer: Self-pay

## 2019-09-26 ENCOUNTER — Inpatient Hospital Stay (HOSPITAL_BASED_OUTPATIENT_CLINIC_OR_DEPARTMENT_OTHER): Payer: Medicare Other | Admitting: Hematology

## 2019-09-26 ENCOUNTER — Telehealth: Payer: Self-pay | Admitting: Hematology

## 2019-09-26 ENCOUNTER — Inpatient Hospital Stay: Payer: Medicare Other | Attending: Hematology

## 2019-09-26 VITALS — BP 123/73 | HR 75 | Temp 98.5°F | Resp 18 | Ht 66.0 in | Wt 152.0 lb

## 2019-09-26 DIAGNOSIS — Z8719 Personal history of other diseases of the digestive system: Secondary | ICD-10-CM | POA: Insufficient documentation

## 2019-09-26 DIAGNOSIS — I480 Paroxysmal atrial fibrillation: Secondary | ICD-10-CM | POA: Insufficient documentation

## 2019-09-26 DIAGNOSIS — K2270 Barrett's esophagus without dysplasia: Secondary | ICD-10-CM | POA: Diagnosis present

## 2019-09-26 DIAGNOSIS — K921 Melena: Secondary | ICD-10-CM | POA: Diagnosis not present

## 2019-09-26 DIAGNOSIS — Z87891 Personal history of nicotine dependence: Secondary | ICD-10-CM | POA: Insufficient documentation

## 2019-09-26 DIAGNOSIS — I5042 Chronic combined systolic (congestive) and diastolic (congestive) heart failure: Secondary | ICD-10-CM | POA: Diagnosis not present

## 2019-09-26 DIAGNOSIS — I739 Peripheral vascular disease, unspecified: Secondary | ICD-10-CM | POA: Insufficient documentation

## 2019-09-26 DIAGNOSIS — Z7901 Long term (current) use of anticoagulants: Secondary | ICD-10-CM | POA: Diagnosis not present

## 2019-09-26 DIAGNOSIS — I2510 Atherosclerotic heart disease of native coronary artery without angina pectoris: Secondary | ICD-10-CM | POA: Insufficient documentation

## 2019-09-26 DIAGNOSIS — K5900 Constipation, unspecified: Secondary | ICD-10-CM | POA: Insufficient documentation

## 2019-09-26 DIAGNOSIS — K449 Diaphragmatic hernia without obstruction or gangrene: Secondary | ICD-10-CM | POA: Diagnosis not present

## 2019-09-26 DIAGNOSIS — D500 Iron deficiency anemia secondary to blood loss (chronic): Secondary | ICD-10-CM

## 2019-09-26 DIAGNOSIS — D509 Iron deficiency anemia, unspecified: Secondary | ICD-10-CM | POA: Diagnosis present

## 2019-09-26 DIAGNOSIS — I110 Hypertensive heart disease with heart failure: Secondary | ICD-10-CM | POA: Diagnosis not present

## 2019-09-26 DIAGNOSIS — Z86010 Personal history of colonic polyps: Secondary | ICD-10-CM | POA: Insufficient documentation

## 2019-09-26 DIAGNOSIS — Z8249 Family history of ischemic heart disease and other diseases of the circulatory system: Secondary | ICD-10-CM | POA: Insufficient documentation

## 2019-09-26 DIAGNOSIS — M1990 Unspecified osteoarthritis, unspecified site: Secondary | ICD-10-CM | POA: Insufficient documentation

## 2019-09-26 DIAGNOSIS — Z800 Family history of malignant neoplasm of digestive organs: Secondary | ICD-10-CM | POA: Insufficient documentation

## 2019-09-26 DIAGNOSIS — Z809 Family history of malignant neoplasm, unspecified: Secondary | ICD-10-CM | POA: Insufficient documentation

## 2019-09-26 DIAGNOSIS — Z79899 Other long term (current) drug therapy: Secondary | ICD-10-CM | POA: Insufficient documentation

## 2019-09-26 DIAGNOSIS — E039 Hypothyroidism, unspecified: Secondary | ICD-10-CM | POA: Diagnosis not present

## 2019-09-26 LAB — CMP (CANCER CENTER ONLY)
ALT: 25 U/L (ref 0–44)
AST: 32 U/L (ref 15–41)
Albumin: 3.7 g/dL (ref 3.5–5.0)
Alkaline Phosphatase: 144 U/L — ABNORMAL HIGH (ref 38–126)
Anion gap: 9 (ref 5–15)
BUN: 28 mg/dL — ABNORMAL HIGH (ref 8–23)
CO2: 29 mmol/L (ref 22–32)
Calcium: 9.3 mg/dL (ref 8.9–10.3)
Chloride: 109 mmol/L (ref 98–111)
Creatinine: 1.25 mg/dL — ABNORMAL HIGH (ref 0.61–1.24)
GFR, Est AFR Am: 59 mL/min — ABNORMAL LOW (ref >60)
GFR, Estimated: 51 mL/min — ABNORMAL LOW (ref >60)
Glucose, Bld: 111 mg/dL — ABNORMAL HIGH (ref 70–99)
Potassium: 4.3 mmol/L (ref 3.5–5.1)
Sodium: 147 mmol/L — ABNORMAL HIGH (ref 135–145)
Total Bilirubin: 0.8 mg/dL (ref 0.3–1.2)
Total Protein: 7 g/dL (ref 6.5–8.1)

## 2019-09-26 LAB — CBC WITH DIFFERENTIAL/PLATELET
Abs Immature Granulocytes: 0.01 10*3/uL (ref 0.00–0.07)
Basophils Absolute: 0.1 10*3/uL (ref 0.0–0.1)
Basophils Relative: 2 %
Eosinophils Absolute: 0.1 10*3/uL (ref 0.0–0.5)
Eosinophils Relative: 3 %
HCT: 32.4 % — ABNORMAL LOW (ref 39.0–52.0)
Hemoglobin: 10 g/dL — ABNORMAL LOW (ref 13.0–17.0)
Immature Granulocytes: 0 %
Lymphocytes Relative: 13 %
Lymphs Abs: 0.6 10*3/uL — ABNORMAL LOW (ref 0.7–4.0)
MCH: 28.6 pg (ref 26.0–34.0)
MCHC: 30.9 g/dL (ref 30.0–36.0)
MCV: 92.6 fL (ref 80.0–100.0)
Monocytes Absolute: 0.5 10*3/uL (ref 0.1–1.0)
Monocytes Relative: 10 %
Neutro Abs: 3.3 10*3/uL (ref 1.7–7.7)
Neutrophils Relative %: 72 %
Platelets: 110 10*3/uL — ABNORMAL LOW (ref 150–400)
RBC: 3.5 MIL/uL — ABNORMAL LOW (ref 4.22–5.81)
RDW: 19.3 % — ABNORMAL HIGH (ref 11.5–15.5)
WBC: 4.6 10*3/uL (ref 4.0–10.5)
nRBC: 0 % (ref 0.0–0.2)

## 2019-09-26 LAB — FERRITIN: Ferritin: 224 ng/mL (ref 24–336)

## 2019-09-26 NOTE — Telephone Encounter (Signed)
Scheduled per 3/2 los. Gave avs and calendar 

## 2019-09-29 ENCOUNTER — Telehealth: Payer: Self-pay | Admitting: *Deleted

## 2019-09-29 NOTE — Telephone Encounter (Signed)
Contacted patient/wife with information that patient only needs one IV iron infusion -appt 3/9 at 1415 - appt for second IV iron on 3/16 will be cancelled due to information from pharmacist and Dr. Irene Limbo as noted below. Mrs. Lindeman verbalized understanding.    "Pharmacist M. Gujrati message to Dr. Irene Limbo: Pt ferritin on 3/2 = 224. He is scheduled for iron on 3/9 and 3/16.  Dr. Irene Limbo response: Would do the one on 3/9 and can cancel 2nd dose scheduled for 3/16. He is still bleeding so I would do the 1 dose."

## 2019-10-03 ENCOUNTER — Inpatient Hospital Stay: Payer: Medicare Other

## 2019-10-03 ENCOUNTER — Other Ambulatory Visit: Payer: Self-pay

## 2019-10-03 ENCOUNTER — Other Ambulatory Visit: Payer: Self-pay | Admitting: Hematology

## 2019-10-03 VITALS — BP 130/75 | HR 76 | Temp 98.9°F | Resp 18

## 2019-10-03 DIAGNOSIS — D500 Iron deficiency anemia secondary to blood loss (chronic): Secondary | ICD-10-CM

## 2019-10-03 DIAGNOSIS — D509 Iron deficiency anemia, unspecified: Secondary | ICD-10-CM | POA: Diagnosis not present

## 2019-10-03 MED ORDER — SODIUM CHLORIDE 0.9 % IV SOLN
750.0000 mg | Freq: Once | INTRAVENOUS | Status: AC
  Administered 2019-10-03: 750 mg via INTRAVENOUS
  Filled 2019-10-03: qty 15

## 2019-10-03 MED ORDER — ACETAMINOPHEN 325 MG PO TABS
650.0000 mg | ORAL_TABLET | Freq: Once | ORAL | Status: AC
  Administered 2019-10-03: 650 mg via ORAL

## 2019-10-03 MED ORDER — ACETAMINOPHEN 325 MG PO TABS
ORAL_TABLET | ORAL | Status: AC
  Filled 2019-10-03: qty 2

## 2019-10-03 MED ORDER — SODIUM CHLORIDE 0.9 % IV SOLN
INTRAVENOUS | Status: DC
  Filled 2019-10-03: qty 250

## 2019-10-03 MED ORDER — LORATADINE 10 MG PO TABS
10.0000 mg | ORAL_TABLET | Freq: Every day | ORAL | Status: DC
  Administered 2019-10-03: 10 mg via ORAL

## 2019-10-03 MED ORDER — LORATADINE 10 MG PO TABS
ORAL_TABLET | ORAL | Status: AC
  Filled 2019-10-03: qty 1

## 2019-10-03 NOTE — Patient Instructions (Signed)

## 2019-10-10 ENCOUNTER — Ambulatory Visit: Payer: Medicare Other

## 2019-10-12 ENCOUNTER — Encounter: Payer: Self-pay | Admitting: Cardiology

## 2019-10-12 ENCOUNTER — Ambulatory Visit (INDEPENDENT_AMBULATORY_CARE_PROVIDER_SITE_OTHER): Payer: Medicare Other | Admitting: Family Medicine

## 2019-10-12 ENCOUNTER — Other Ambulatory Visit: Payer: Self-pay

## 2019-10-12 VITALS — BP 130/80 | HR 76 | Ht 66.0 in | Wt 154.0 lb

## 2019-10-12 DIAGNOSIS — I25119 Atherosclerotic heart disease of native coronary artery with unspecified angina pectoris: Secondary | ICD-10-CM

## 2019-10-12 DIAGNOSIS — I5042 Chronic combined systolic (congestive) and diastolic (congestive) heart failure: Secondary | ICD-10-CM

## 2019-10-12 DIAGNOSIS — I38 Endocarditis, valve unspecified: Secondary | ICD-10-CM

## 2019-10-12 DIAGNOSIS — I480 Paroxysmal atrial fibrillation: Secondary | ICD-10-CM | POA: Diagnosis not present

## 2019-10-12 DIAGNOSIS — Z950 Presence of cardiac pacemaker: Secondary | ICD-10-CM

## 2019-10-12 NOTE — Patient Instructions (Addendum)
Medication Instructions:   Your physician recommends that you continue on your current medications as directed. Please refer to the Current Medication list given to you today.  Labwork:  NONE  Testing/Procedures:  NONE  Follow-Up:  Your physician recommends that you schedule a follow-up appointment in: 4 months (office).  Any Other Special Instructions Will Be Listed Below (If Applicable).  If you need a refill on your cardiac medications before your next appointment, please call your pharmacy. 

## 2019-10-12 NOTE — Progress Notes (Addendum)
Cardiology Office Note  Date: 10/12/2019   ID: Cid, Billick [birth year], MRN [id number]  PCP:  Eber Hong, MD  Cardiologist:  Rozann Lesches, MD Electrophysiologist:  Thompson Grayer, MD   Chief Complaint: Chronic combined heart failure, atrial fibrillation/flutter, sick sinus syndrome with pacemaker, CAD status post CABG, hypertension, hyperlipidemia.  History of Present Illness: [name] is a 86 y.o. male with a history of combined heart failure with LVEF of 40%  and severe RV dysfunction and valvular.  Status post aortic valve and mitral valve repair.  Mixed ischemic and nonischemic cardiomyopathy, CAD status post CABG, [employer] pacemaker in place managed by Dr. Rayann Heman, paroxysmal atrial fibrillation (status post Cox/Maze procedure) with recurrence of atrial fibrillation on Coumadin and amiodarone.  Other history includes HTN,HLD, atrial flutter (prior history of cardioversion and overdrive pacing), GERD, hypothyroidism.  Patient states he has been doing well from a cardiac standpoint.  He denies any progressive anginal or exertional symptoms, sensation of palpitations or arrhythmias, orthostatic symptoms, stroke or TIA-like symptoms, dyspeptic symptoms, bleeding issues, claudication-like issues, DVT or PE-like symptoms, or lower extremity edema.  He is on continuous O2 at 2 L per nasal cannula recently ordered status post discharge from last hospital admission.  He is using a Rollator to ambulate.  He has iron deficiency anemia and has occasional iron infusions.  Most recent hemoglobin hematocrit 8.2 and 27.2 June 28, 2019.  Recent creatinine 1.29, on June 28, 2019.  Recent H&H 8.2 and 27.2 June 28, 2019   Past Medical History:  Diagnosis Date  . Anemia   . Arthritis   . Atrial flutter (HCC)    Amiodarone therapy, coumadin therapy, prior history of cardioversion and overdrive pacing  . Barrett's esophagus   . Benign prostatic hyperplasia   . Cardiomyopathy    Mixed ischemic and nonischemic with previous EF of 35-40%, most recently in June 2009 normalized to 55-60%. Echo 2/11 EF 55-60%  . Carotid artery disease (HCC)    Less than 50% bilateral internal carotid artery stenosis by Doppler 2009  . Carpal tunnel syndrome   . Chronic diastolic (congestive) heart failure ([place])   . Coronary atherosclerosis of native coronary artery    Status post coronary bypass grafting  . 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 Endocarditis   . Essential hypertension, benign   . Ganglion cyst    right wrist  . GERD (gastroesophageal reflux disease)    PMH  . Hiatal hernia   . HOH (hard of hearing)   . Hyperlipidemia   . Hypothyroidism   . Inguinal hernia    Left  . Internal hemorrhoids   . Iron deficiency anemia   . Obstructive sleep apnea   . Osteoporosis   . Paroxysmal atrial fibrillation (HCC)    Amiodarone therapy, status post Cox-Maze procedure but with recurrent atrial fibrillation while on amiodarone August 2012  . Presence of permanent cardiac pacemaker   . Symptomatic bradycardia   . Tubular adenoma of colon   . 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 Warfarin anticoagulation   . Wears glasses   . Wears partial dentures     Past Surgical History:  Procedure Laterality Date  . AORTIC VALVE REPLACEMENT    . CARPAL TUNNEL RELEASE  2009  . CATARACT EXTRACTION, BILATERAL  07/2011  . COLONOSCOPY W/ BIOPSIES AND POLYPECTOMY    . CORONARY  ARTERY BYPASS GRAFT  2008   x2  . HERNIA REPAIR  age 15  . INGUINAL HERNIA REPAIR Left 08/30/2017   Procedure: LEFT INGUINAL HERNIA REPAIR;  Surgeon: Rolm Bookbinder, MD;  Location: [facility];  Service: General;  Laterality: Left;  GENERAL ETT AND TAP BLOCK  . INSERTION OF MESH Left 08/30/2017   Procedure: INSERTION OF MESH;  Surgeon: Rolm Bookbinder, MD;  Location: [facility];  Service: General;  Laterality:  Left;  GENERAL ETT AND TAP BLOCK  . KNEE ARTHROSCOPY    . MASS EXCISION Right 01/26/2018   Procedure: Right wrist dorsal mass excision;  Surgeon: Iran Planas, MD;  Location: [facility];  Service: Orthopedics;  Laterality: Right;  60 mins  . PACEMAKER INSERTION  2009   [facility]ially implanted at [facility], extracted by Dr Lovena Le for endocarditis and reimplanted 8/12  . PATELLA FRACTURE SURGERY  age 76   screw placed and removed  . TEE WITHOUT CARDIOVERSION N/A 07/05/2017   Procedure: TRANSESOPHAGEAL ECHOCARDIOGRAM (TEE);  Surgeon: Sanda Klein, MD;  Location: [facility];  Service: Cardiovascular;  Laterality: N/A;  . TONSILLECTOMY  1939  . TOTAL HIP ARTHROPLASTY      Current Outpatient Medications  Medication Sig Dispense Refill  . amiodarone (PACERONE) 200 MG tablet TAKE 1/2 TABLET BY MOUTH EVERY DAY 45 tablet 2  . amLODipine (NORVASC) 5 MG tablet Take 5 mg by mouth every morning.   3  . amoxicillin (AMOXIL) 500 MG tablet Take 2,000 mg by mouth See admin instructions. Take four capsules (2000 mg) by mouth one hour prior to dental appointments    . benazepril (LOTENSIN) 40 MG tablet Take 1 tablet (40 mg total) by mouth daily. 90 tablet 1  . carvedilol (COREG) 3.125 MG tablet Take 1 tablet (3.125 mg total) by mouth 2 (two) times daily with a meal. 180 tablet 1  . docusate sodium (COLACE) 100 MG capsule Take 200-300 mg by mouth at bedtime.     Marland Kitchen levothyroxine (SYNTHROID, LEVOTHROID) 50 MCG tablet Take 50 mcg by mouth daily before breakfast.    . Multiple Vitamin (MULTIVITAMIN WITH MINERALS) TABS tablet Take 0.5 tablets by mouth 2 (two) times daily.    . nitroGLYCERIN (NITROSTAT) 0.4 MG SL tablet Place 1 tablet (0.4 mg total) under the tongue every 5 (five) minutes as needed. Up to 3 doses, if no relief after 3rd dose, proceed to the ED for an evaluation. (Patient taking differently: Place 0.4 mg under the tongue every 5 (five) minutes as needed for chest pain (Up to 3 doses, if no relief after 3rd dose,  proceed to the ED for an evaluation.). ) 25 tablet 3  . potassium chloride SA (KLOR-CON M20) 20 MEQ tablet Take 1-2 tablets (20-40 mEq total) by mouth as directed. Take 2 on days with 40 mg torsemide and 1 on days with 20 mg torsemide 60 tablet 6  . pravastatin (PRAVACHOL) 40 MG tablet Take 1 tablet (40 mg total) by mouth daily with supper. 90 tablet 1  . Probiotic Product (PROBIOTIC DAILY PO) Take 1 tablet by mouth every morning.     . Psyllium (METAMUCIL PO) Take 5 mLs by mouth every morning. Mix in orange juice    . torsemide (DEMADEX) 20 MG tablet Take 1-2 tablets (20-40 mg total) by mouth as directed. Take 40 mg alternating with 20 mg daily 60 tablet 6  . warfarin (COUMADIN) 2 MG tablet Take 2-3 mg by mouth See admin instructions. Take 1 1/2 tablet (3 mg) by mouth on  Sunday morning, take 1 tablet (2 mg) on all other mornings of the week     No current facility-administered medications for this visit.   Allergies:  Patient has no known allergies.   Social History: The patient  reports that he quit smoking about 65 years ago. His smoking use included cigarettes. He started smoking about 75 years ago. He has a 2.40 pack-year smoking history. He has never used smokeless tobacco. He reports current alcohol use. He reports that he does not use drugs.   Family History: The patient's family history includes Cancer in his father; Colon cancer in his mother; Coronary artery disease in his father; Other in his father.   ROS:  Please see the history of present illness. Otherwise, complete review of systems is positive for none.  All other systems are reviewed and negative.   Physical Exam: VS:  BP 130/80   Pulse 76   Ht 5\' 6"  (1.676 m)   Wt 154 lb (69.9 kg)   SpO2 98%   BMI 24.86 kg/m , BMI Body mass index is 24.86 kg/m.  Wt Readings from Last 3 Encounters:  10/12/19 154 lb (69.9 kg)  09/26/19 152 lb (68.9 kg)  06/28/19 154 lb 8 oz (70.1 kg)    General: Patient appears comfortable at  rest. Neck: Supple, no elevated JVP or carotid bruits, no thyromegaly. Lungs: Clear to auscultation, nonlabored breathing at rest. Cardiac: Regular rate and rhythm, no S3 or significant systolic murmur, no pericardial rub. Extremities: No pitting edema, distal pulses 2+. Skin: Warm and dry. Musculoskeletal: No kyphosis. Neuropsychiatric: Alert and oriented x3, affect grossly appropriate.  ECG:  None today  Recent Labwork: 04/21/2019: B Natriuretic Peptide 1,054.2 04/26/2019: Magnesium 2.1 09/26/2019: ALT 25; AST 32; BUN 28; Creatinine 1.25; Hemoglobin 10.0; Platelets 110; Potassium 4.3; Sodium 147  No results found for: CHOL, TRIG, HDL, CHOLHDL, VLDL, LDLCALC, LDLDIRECT  Other Studies Reviewed Today: Echocardiogram 04/22/2019  1. Left ventricular ejection fraction, by visual estimation, is 45 to 50%. The left ventricle has normal function. Normal left ventricular size. There is no left ventricular hypertrophy. 2. Abnormal septal motion consistent with RV pacemaker and right ventricular volume overload. 3. Left ventricular diastolic function could not be evaluated pattern of LV diastolic filling. 4. Global right ventricle has severely reduced systolic function.The right ventricular size is severely enlarged. No increase in right ventricular wall thickness. 5. Left atrial size was severely dilated. 6. Right atrial size was severely dilated. 7. The mitral valve has been repaired/replaced. Moderate to severe mitral valve regurgitation. Mild mitral stenosis. 8. The tricuspid valve is grossly normal. Tricuspid valve regurgitation severe. 9. Aortic valve regurgitation was not visualized by color flow Doppler. 10. The pulmonic valve was grossly normal. Pulmonic valve regurgitation is trivial by color flow Doppler. 11. Moderately elevated pulmonary artery systolic pressure. 12. A pacer wire is visualized. 13. The inferior vena cava is dilated in size with <50% respiratory variability,  suggesting right atrial pressure of 15 mmHg. 61. Compared to the 2018 study, mitral regurgitation has worsened and the AV prosthesis gradients have improved (possibly a function of lower cardiac output; the patient was anemic in 2018). Although the LVEF was reported higher in 2018, the side-by-side comparison shows similar LV contractility.  Assessment and Plan:  1. Chronic combined systolic (congestive) and diastolic (congestive) heart failure (HCC)   2. Valvular heart disease   3. Coronary artery disease involving native coronary artery of native heart with angina pectoris ([place])   4. PAF (paroxysmal atrial  fibrillation) ([place])   5. Pacemaker    1. Chronic combined systolic (congestive) and diastolic (congestive) heart failure (HCC) Patient states he has been doing well without any significant dyspnea, weight gain, or LE edema.  Recent labs showed Crt 1.29 from PCP December 2020.  Continue torsemide 20 to 40 mg daily as directed.  Alternate 40 mg with 20 mg every other day.  Continue benazepril 40 mg daily, carvedilol 3.125 mg daily He is on continuous 02 at 2lpm [place]. He states this was a last minute decision by medical providers prior to recent discharge from recent hospital admission. He states he is not sure of the rationale for needing to start the oxygen.  2. Valvular heart disease Status post AVR replacement and mitral valve repair.  Continues with 3 out of 6 audible systolic murmur heard best at left lower sternal border.  Patient has moderate to severe mitral valve regurgitation with mild mitral stenosis.  Patient states he is not very active on a daily basis so he cannot tell if the valvular regurgitation is affecting him in any significant way.    3. Coronary artery disease involving native coronary artery of native heart with angina pectoris [facility]) He denies any progressive anginal or exertional symptoms.  Continue nitroglycerin 0.4 mg sublingual as needed.  Continue carvedilol 3.125  p.o. twice daily.  Continue pravastatin 40 mg daily.    4. PAF (paroxysmal atrial fibrillation) ([place]) Patient states he cannot take tell if he is in atrial fib at any given time or not.  Pulse is regular today at 76. Continue amiodarone 100 mg p.o. daily.  Patient had a recent TSH of 12.15 and T4 of 1.4.  Patient has hypothyroidism and is on levothyroxine.  5. Pacemaker Patient is followed by Dr. Rayann Heman for pacemaker management.  Had a recent remote pacemaker check which was normal.  He denies any issues with his pacemaker   Medication Adjustments/Labs and Tests Ordered: Current medicines are reviewed at length with the patient today.  Concerns regarding medicines are outlined above.   Disposition: Follow-up with Domenic Polite or APP and 4 months  Signed, Levell July, NP 10/12/2019 12:21 PM    [facility] [address], [place], [place] [address] Phone: [phone]; Fax: [phone]

## 2019-10-24 ENCOUNTER — Telehealth: Payer: Self-pay | Admitting: Hematology

## 2019-10-24 NOTE — Telephone Encounter (Signed)
R/s 7/6 appt per MD template change. Called and spoke with patient. Confirmed appt

## 2019-11-13 ENCOUNTER — Ambulatory Visit: Payer: Medicare Other | Admitting: Cardiology

## 2019-11-29 ENCOUNTER — Telehealth: Payer: Self-pay | Admitting: Cardiology

## 2019-11-29 NOTE — Telephone Encounter (Signed)
Wife called asking if she takes her husband to [facility] ED if [name] could call ahead to get them in or help with the process of getting him admitted  He has been not feeling well, urinating a lot, INR is  4.1, cannot get up on his own and having issues with his knees

## 2019-11-29 NOTE — Telephone Encounter (Signed)
Wife informed that Dr. Domenic Polite didn't admit patients in the hospital. Advised that once he is seen in the ED, the cardiology provider would be contacted if need be. Verbalized understanding of plan.

## 2019-12-05 ENCOUNTER — Telehealth: Payer: Self-pay | Admitting: Cardiology

## 2019-12-05 NOTE — Telephone Encounter (Signed)
Mrs. Delarm called stating that she has taken her husband to [facility] [place] regards to patient having severe back pain.  She is wanting to take him to [facility] via ambulance. I explained to patient's wife Tandy Gaw) that if she takes patient to another hospital it will be the same protocol. Her main concern was having Dr. Domenic Polite admit the patient. Mrs. Lacap is going to go and speak with the ER doctor about contacting an Human resources officer.

## 2019-12-05 NOTE — Telephone Encounter (Signed)
Thank you for letting me know.  If there is a question about having him admitted to [facility], this would need to be coordinated through [facility] and a discussion with the appropriate admitting provider.  I cannot tell from the information provided whether this is an acute orthopedic issue or not.

## 2019-12-06 NOTE — Telephone Encounter (Signed)
Mrs. Cagnina called back to the [facility] stating that the ER doctor was going to admit patient and do an Orthopedic consult.

## 2019-12-15 ENCOUNTER — Telehealth: Payer: Self-pay

## 2019-12-15 NOTE — Telephone Encounter (Signed)
Left message for patient to remind of missed remote transmission.  

## 2019-12-18 ENCOUNTER — Telehealth: Payer: Self-pay

## 2019-12-18 NOTE — Telephone Encounter (Signed)
Pt missed transmission because he is hospitalized. I told her whenever he get out the hospital they can send a transmission.

## 2019-12-27 ENCOUNTER — Other Ambulatory Visit: Payer: Self-pay | Admitting: Cardiology

## 2020-01-03 ENCOUNTER — Other Ambulatory Visit: Payer: Self-pay | Admitting: Cardiology

## 2020-01-26 ENCOUNTER — Telehealth: Payer: Self-pay | Admitting: Hematology

## 2020-01-26 NOTE — Telephone Encounter (Signed)
Rescheduled 07/06 appointment per provider request, called patient regarding rescheduled appointment. Left a voicemail.

## 2020-01-30 ENCOUNTER — Ambulatory Visit: Payer: Medicare Other | Admitting: Hematology

## 2020-01-30 ENCOUNTER — Other Ambulatory Visit: Payer: Medicare Other

## 2020-02-13 ENCOUNTER — Ambulatory Visit: Payer: Medicare Other | Admitting: Cardiology

## 2020-02-13 ENCOUNTER — Telehealth: Payer: Self-pay | Admitting: Hematology

## 2020-02-13 NOTE — Telephone Encounter (Signed)
Rescheduled 07/26 appointment to 08/02 per patient's request.

## 2020-02-16 ENCOUNTER — Emergency Department (HOSPITAL_COMMUNITY): Payer: Medicare Other

## 2020-02-16 ENCOUNTER — Emergency Department (HOSPITAL_COMMUNITY)
Admission: EM | Admit: 2020-02-16 | Discharge: 2020-02-17 | Disposition: A | Discharge status: Home / Self Care | Payer: Medicare Other | Attending: Emergency Medicine | Admitting: Emergency Medicine

## 2020-02-16 ENCOUNTER — Other Ambulatory Visit: Payer: Self-pay

## 2020-02-16 ENCOUNTER — Encounter (HOSPITAL_COMMUNITY): Payer: Self-pay | Admitting: Pediatrics

## 2020-02-16 DIAGNOSIS — I4891 Unspecified atrial fibrillation: Secondary | ICD-10-CM | POA: Insufficient documentation

## 2020-02-16 DIAGNOSIS — Y828 Other medical devices associated with adverse incidents: Secondary | ICD-10-CM | POA: Insufficient documentation

## 2020-02-16 DIAGNOSIS — I2510 Atherosclerotic heart disease of native coronary artery without angina pectoris: Secondary | ICD-10-CM | POA: Diagnosis not present

## 2020-02-16 DIAGNOSIS — I110 Hypertensive heart disease with heart failure: Secondary | ICD-10-CM | POA: Insufficient documentation

## 2020-02-16 DIAGNOSIS — M4800 Spinal stenosis, site unspecified: Secondary | ICD-10-CM | POA: Diagnosis not present

## 2020-02-16 DIAGNOSIS — T83511D Infection and inflammatory reaction due to indwelling urethral catheter, subsequent encounter: Secondary | ICD-10-CM

## 2020-02-16 DIAGNOSIS — Z87891 Personal history of nicotine dependence: Secondary | ICD-10-CM | POA: Insufficient documentation

## 2020-02-16 DIAGNOSIS — I4892 Unspecified atrial flutter: Secondary | ICD-10-CM | POA: Insufficient documentation

## 2020-02-16 DIAGNOSIS — Z96649 Presence of unspecified artificial hip joint: Secondary | ICD-10-CM | POA: Insufficient documentation

## 2020-02-16 DIAGNOSIS — N390 Urinary tract infection, site not specified: Secondary | ICD-10-CM

## 2020-02-16 DIAGNOSIS — Z7901 Long term (current) use of anticoagulants: Secondary | ICD-10-CM | POA: Insufficient documentation

## 2020-02-16 DIAGNOSIS — M25572 Pain in left ankle and joints of left foot: Secondary | ICD-10-CM | POA: Insufficient documentation

## 2020-02-16 DIAGNOSIS — Z955 Presence of coronary angioplasty implant and graft: Secondary | ICD-10-CM | POA: Insufficient documentation

## 2020-02-16 DIAGNOSIS — I5032 Chronic diastolic (congestive) heart failure: Secondary | ICD-10-CM | POA: Insufficient documentation

## 2020-02-16 DIAGNOSIS — E039 Hypothyroidism, unspecified: Secondary | ICD-10-CM | POA: Diagnosis not present

## 2020-02-16 DIAGNOSIS — Z79890 Hormone replacement therapy (postmenopausal): Secondary | ICD-10-CM | POA: Diagnosis not present

## 2020-02-16 DIAGNOSIS — Z79899 Other long term (current) drug therapy: Secondary | ICD-10-CM | POA: Diagnosis not present

## 2020-02-16 DIAGNOSIS — T83511A Infection and inflammatory reaction due to indwelling urethral catheter, initial encounter: Secondary | ICD-10-CM | POA: Insufficient documentation

## 2020-02-16 LAB — COMPREHENSIVE METABOLIC PANEL
ALT: 15 U/L (ref 0–44)
AST: 25 U/L (ref 15–41)
Albumin: 3 g/dL — ABNORMAL LOW (ref 3.5–5.0)
Alkaline Phosphatase: 66 U/L (ref 38–126)
Anion gap: 9 (ref 5–15)
BUN: 20 mg/dL (ref 8–23)
CO2: 27 mmol/L (ref 22–32)
Calcium: 9.1 mg/dL (ref 8.9–10.3)
Chloride: 101 mmol/L (ref 98–111)
Creatinine, Ser: 1.22 mg/dL (ref 0.61–1.24)
GFR calc Af Amer: >60 mL/min (ref >60)
GFR calc non Af Amer: 53 mL/min — ABNORMAL LOW (ref >60)
Glucose, Bld: 99 mg/dL (ref 70–99)
Potassium: 4.1 mmol/L (ref 3.5–5.1)
Sodium: 137 mmol/L (ref 135–145)
Total Bilirubin: 1.1 mg/dL (ref 0.3–1.2)
Total Protein: 6.6 g/dL (ref 6.5–8.1)

## 2020-02-16 LAB — CBC WITH DIFFERENTIAL/PLATELET
Abs Immature Granulocytes: 0.02 10*3/uL (ref 0.00–0.07)
Basophils Absolute: 0.1 10*3/uL (ref 0.0–0.1)
Basophils Relative: 1 %
Eosinophils Absolute: 0.1 10*3/uL (ref 0.0–0.5)
Eosinophils Relative: 1 %
HCT: 29.2 % — ABNORMAL LOW (ref 39.0–52.0)
Hemoglobin: 8.6 g/dL — ABNORMAL LOW (ref 13.0–17.0)
Immature Granulocytes: 0 %
Lymphocytes Relative: 9 %
Lymphs Abs: 0.6 10*3/uL — ABNORMAL LOW (ref 0.7–4.0)
MCH: 27.2 pg (ref 26.0–34.0)
MCHC: 29.5 g/dL — ABNORMAL LOW (ref 30.0–36.0)
MCV: 92.4 fL (ref 80.0–100.0)
Monocytes Absolute: 0.5 10*3/uL (ref 0.1–1.0)
Monocytes Relative: 7 %
Neutro Abs: 5.4 10*3/uL (ref 1.7–7.7)
Neutrophils Relative %: 82 %
Platelets: 133 10*3/uL — ABNORMAL LOW (ref 150–400)
RBC: 3.16 MIL/uL — ABNORMAL LOW (ref 4.22–5.81)
RDW: 19.7 % — ABNORMAL HIGH (ref 11.5–15.5)
WBC: 6.6 10*3/uL (ref 4.0–10.5)
nRBC: 0 % (ref 0.0–0.2)

## 2020-02-16 LAB — URINALYSIS, ROUTINE W REFLEX MICROSCOPIC
Bilirubin Urine: NEGATIVE
Glucose, UA: NEGATIVE mg/dL
Ketones, ur: NEGATIVE mg/dL
Nitrite: POSITIVE — AB
Protein, ur: 30 mg/dL — AB
RBC / HPF: >50 RBC/hpf — ABNORMAL HIGH (ref 0–5)
Specific Gravity, Urine: 1.009 (ref 1.005–1.030)
pH: 8 (ref 5.0–8.0)

## 2020-02-16 NOTE — ED Triage Notes (Signed)
Reported was sent home from rehab center last Wednesday, and has significant history of hip problems for years. Wife at bedside endorsed multiple complaints such as foley catheter / UTI, pressure ulcers, and unable to move, left ankle pain, and activity intolerance d/t bilateral knees weakness.

## 2020-02-17 ENCOUNTER — Emergency Department (HOSPITAL_COMMUNITY): Payer: Medicare Other

## 2020-02-17 DIAGNOSIS — M25572 Pain in left ankle and joints of left foot: Secondary | ICD-10-CM | POA: Diagnosis not present

## 2020-02-17 LAB — PROTIME-INR
INR: 3.4 — ABNORMAL HIGH (ref 0.8–1.2)
Prothrombin Time: 33 seconds — ABNORMAL HIGH (ref 11.4–15.2)

## 2020-02-17 MED ORDER — HYDROCODONE-ACETAMINOPHEN 5-325 MG PO TABS: 1.0000 | ORAL_TABLET | Freq: Four times a day (QID) | ORAL | 0 refills | Status: AC | PRN

## 2020-02-17 MED ORDER — HYDROCODONE-ACETAMINOPHEN 5-325 MG PO TABS
1.0000 | ORAL_TABLET | Freq: Once | ORAL | Status: AC
  Administered 2020-02-17: 1 via ORAL
  Filled 2020-02-17: qty 1

## 2020-02-17 NOTE — ED Notes (Signed)
The pts  Wife  Went home and just now returned   The pt has been to c-t and returned

## 2020-02-17 NOTE — ED Provider Notes (Signed)
[facility] EMERGENCY DEPARTMENT Provider Note   CSN: [id number] Arrival date & time: 02/16/20  1452     History Chief Complaint  Patient presents with   Hip Pain   Knee Pain   Ankle Pain   Foley catheter problem    [name] is a 86 y.o. male.  The history is provided by the patient and the spouse.  Patient with extensive history including atrial fibrillation on anticoagulation, CABG, aortic stenosis status post AVR, spinal stenosis, presents for multiple issues.  Patient has had difficulty ambulating for quite some time.  This has been present for several months.  He has significant back pain at times from his spinal stenosis.  Patient was admitted to an outside hospital in [place] back in May and then was placed in rehab.  He was discharged from rehab this past week.  He was also started on an antibiotic (bactrim) for a urinary tract infection as he does have an indwelling Foley.  Since being at home he had difficulty moving in bed and doing activities of daily living.  Wife is also concerned because he appears to have more pain and difficulty moving the left leg. He also has pain in the left hip however has had hip replacement previously No new falls      Past Medical History:  Diagnosis Date   Anemia    Arthritis    Atrial flutter (HCC)    Amiodarone therapy, coumadin therapy, prior history of cardioversion and overdrive pacing   Barrett's esophagus    Benign prostatic hyperplasia    Cardiomyopathy    Mixed ischemic and nonischemic with previous EF of 35-40%, most recently in June 2009 normalized to 55-60%. Echo 2/11 EF 55-60%   Carotid artery disease (HCC)    Less than 50% bilateral internal carotid artery stenosis by Doppler 2009   Carpal tunnel syndrome    Chronic diastolic (congestive) heart failure (HCC)    Coronary atherosclerosis of native coronary artery    Status post coronary bypass grafting   Endocarditis    s/p PPM  extraction by Dr Lovena Le 2012 endocarditis with enterococcus faecalis   Endocarditis    Essential hypertension, benign    Ganglion cyst    right wrist   GERD (gastroesophageal reflux disease)    PMH   Hiatal hernia    HOH (hard of hearing)    Hyperlipidemia    Hypothyroidism    Inguinal hernia    Left   Internal hemorrhoids    Iron deficiency anemia    Obstructive sleep apnea    Osteoporosis    Paroxysmal atrial fibrillation (HCC)    Amiodarone therapy, status post Cox-Maze procedure but with recurrent atrial fibrillation while on amiodarone August 2012   Presence of permanent cardiac pacemaker    Symptomatic bradycardia    Tubular adenoma of colon    Valvular heart disease    Status post 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   Warfarin anticoagulation    Wears glasses    Wears partial dentures     Patient Active Problem List   Diagnosis Date Noted   Congestive heart failure (CHF) ([place]) 04/21/2019   History of atrial fibrillation    Anticoagulation monitoring by pharmacist    History of prosthetic aortic valve    Inguinal hernia 08/30/2017   Bacteremia    Hypoxia 07/03/2017   Iron deficiency anemia due to chronic blood loss 05/14/2016   Barrett's esophagus without dysplasia 01/22/2016   Anemia 08/01/2015  Symptomatic anemia 08/01/2015   Chronic diastolic CHF (congestive heart failure) ([place]) 08/01/2015   Sick sinus syndrome ([place]) 03/20/2013   Pacemaker 11/24/2011   Warfarin anticoagulation    Paroxysmal atrial fibrillation (HCC)    Valvular heart disease    Atrial flutter ([place])    Carotid artery disease ([place])    History of endocarditis    HYPERLIPIDEMIA 10/10/2010   OSA on CPAP 11/01/2009   Hypertensive cardiovascular disease 04/10/2009   Coronary atherosclerosis of native coronary artery 04/10/2009   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    Past Surgical History:  Procedure Laterality Date    AORTIC VALVE REPLACEMENT     CARPAL TUNNEL RELEASE  2009   CATARACT EXTRACTION, BILATERAL  07/2011   COLONOSCOPY W/ BIOPSIES AND POLYPECTOMY     CORONARY ARTERY BYPASS GRAFT  2008   x2   HERNIA REPAIR  age 55   INGUINAL HERNIA REPAIR Left 08/30/2017   Procedure: LEFT INGUINAL HERNIA REPAIR;  Surgeon: Rolm Bookbinder, MD;  Location: [facility];  Service: General;  Laterality: Left;  GENERAL ETT AND TAP BLOCK   INSERTION OF MESH Left 08/30/2017   Procedure: INSERTION OF MESH;  Surgeon: Rolm Bookbinder, MD;  Location: [facility];  Service: General;  Laterality: Left;  GENERAL ETT AND TAP BLOCK   KNEE ARTHROSCOPY     MASS EXCISION Right 01/26/2018   Procedure: Right wrist dorsal mass excision;  Surgeon: Iran Planas, MD;  Location: [address];  Service: Orthopedics;  Laterality: Right;  60 mins   PACEMAKER INSERTION  2009   [facility]ially implanted [facility], extracted by Dr Lovena Le for endocarditis and reimplanted 8/12   PATELLA FRACTURE SURGERY  age 28   screw placed and removed   TEE WITHOUT CARDIOVERSION N/A 07/05/2017   Procedure: TRANSESOPHAGEAL ECHOCARDIOGRAM (TEE);  Surgeon: Sanda Klein, MD;  Location[facility] ENDOSCOPY;  Service: Cardiovascular;  Laterality: N/A;   TONSILLECTOMY  1939   TOTAL HIP ARTHROPLASTY         Family History  Problem Relation Age of Onset   Coronary artery disease Father    Other Father        Heart attack or stroke   Cancer Father    Colon cancer Mother    Diabetes Neg Hx    Hypertension Neg Hx    Esophageal cancer Neg Hx    Rectal cancer Neg Hx    Stomach cancer Neg Hx     Social History   Tobacco Use   Smoking status: Former Smoker    Packs/day: 0.30    Years: 8.00    Pack years: 2.40    Types: Cigarettes    Start date: 03/25/1944    Quit date: 07/27/1954    Years since quitting: 65.6   Smokeless tobacco: Never Used  Vaping Use   Vaping Use: Never used  Substance Use Topics   Alcohol use: Yes    Alcohol/week: 0.0 standard drinks      Comment: glass of wine occ   Drug use: No    Home Medications Prior to Admission medications   Medication Sig Start Date End Date Taking? Authorizing Provider  amiodarone (PACERONE) 200 MG tablet TAKE 1/2 TABLET BY MOUTH EVERY DAY 09/04/19   Allred, Jeneen Rinks, MD  amLODipine (NORVASC) 5 MG tablet Take 5 mg by mouth every morning.  01/18/17   [provider]  amoxicillin (AMOXIL) 500 MG tablet Take 2,000 mg by mouth See admin instructions. Take four capsules (2000 mg) by mouth one hour prior to dental appointments  [provider]  benazepril (LOTENSIN) 40 MG tablet TAKE 1 TABLET BY MOUTH EVERY DAY 01/03/20   Satira Sark, MD  carvedilol (COREG) 3.125 MG tablet TAKE 1 TABLET (3.125 MG TOTAL) BY MOUTH 2 (TWO) TIMES DAILY WITH A MEAL. 01/03/20   Satira Sark, MD  docusate sodium (COLACE) 100 MG capsule Take 200-300 mg by mouth at bedtime.     [provider]  levothyroxine (SYNTHROID, LEVOTHROID) 50 MCG tablet Take 50 mcg by mouth daily before breakfast.    [provider]  Multiple Vitamin (MULTIVITAMIN WITH MINERALS) TABS tablet Take 0.5 tablets by mouth 2 (two) times daily.    [provider]  nitroGLYCERIN (NITROSTAT) 0.4 MG SL tablet Place 1 tablet (0.4 mg total) under the tongue every 5 (five) minutes as needed. Up to 3 doses, if no relief after 3rd dose, proceed to the ED for an evaluation. Patient taking differently: Place 0.4 mg under the tongue every 5 (five) minutes as needed for chest pain (Up to 3 doses, if no relief after 3rd dose, proceed to the ED for an evaluation.).  01/25/18   Satira Sark, MD  potassium chloride SA (KLOR-CON M20) 20 MEQ tablet Take 1-2 tablets (20-40 mEq total) by mouth as directed. Take 2 on days with 40 mg torsemide and 1 on days with 20 mg torsemide 06/15/19   Satira Sark, MD  pravastatin (PRAVACHOL) 40 MG tablet TAKE 1 TABLET (40 MG TOTAL) BY MOUTH DAILY WITH SUPPER. 12/27/19   Satira Sark, MD   Probiotic Product (PROBIOTIC DAILY PO) Take 1 tablet by mouth every morning.     [provider]  Psyllium (METAMUCIL PO) Take 5 mLs by mouth every morning. Mix in orange juice    [provider]  torsemide (DEMADEX) 20 MG tablet Take 1-2 tablets (20-40 mg total) by mouth as directed. Take 40 mg alternating with 20 mg daily 06/15/19   Satira Sark, MD  warfarin (COUMADIN) 2 MG tablet Take 2-3 mg by mouth See admin instructions. Take 1 1/2 tablet (3 mg) by mouth on Sunday morning, take 1 tablet (2 mg) on all other mornings of the week    [provider]    Allergies    Patient has no known allergies.  Review of Systems   Review of Systems  Constitutional: Negative for fever.  Cardiovascular: Negative for chest pain.  Gastrointestinal: Negative for abdominal pain.  Genitourinary: Positive for difficulty urinating.  Musculoskeletal: Positive for gait problem.  Skin: Positive for wound.  Neurological: Positive for weakness. Negative for headaches.  All other systems reviewed and are negative.   Physical Exam Updated Vital Signs BP 123/73    Pulse 75    Temp 98.6 F (37 C) (Oral)    Resp 18    Ht 1.651 m (5\' 5" )    Wt 68.5 kg    SpO2 100%    BMI 25.13 kg/m   Physical Exam CONSTITUTIONAL: Elderly, no acute distress HEAD: Normocephalic/atraumatic EYES: EOMI/PERRL ENMT: Mucous membranes moist NECK: supple no meningeal signs SPINE/BACK: Lumbar paraspinal tenderness noted, no bruising/crepitance/stepoffs noted to spine CV: S1/S2 noted, murmur noted LUNGS: Lungs are clear to auscultation bilaterally, no apparent distress ABDOMEN: soft, nontender, no rebound or guarding, bowel sounds noted throughout abdomen GU:no cva tenderness, Foley catheter in place.  Mild erythema noted to the scrotum.  No crepitus or drainage.  No tenderness noted to the scrotum.  Male chaperone present for exam NEURO: Pt is awake/alert/appropriate, moves  all extremitiesx4.  No  facial droop.  Patient has appropriate strength noted in his right leg.  He can flex and extend the right hip, flex and extend the right knee, and has appropriate ankle dorsi and plantar flexion.  Neuro exam on left leg is limited as patient has previous hip replacement and has had difficulty with leg movement for years.  He is able to flex and extend at the left hip and flex extend of the left knee.  Patient does appear to have weakness with great toe extension as well as plantar flexion on the left as compared to the right.  He reports numbness in the left leg at times. EXTREMITIES: pulses normal/equal, full ROM, distal pulses equal and intact Left achilles appears intact SKIN: warm, color normal, sacral wound is noted without features of infection PSYCH: no abnormalities of mood noted, alert and oriented to situation  ED Results / Procedures / Treatments   Labs (all labs ordered are listed, but only abnormal results are displayed) Labs Reviewed  URINALYSIS, ROUTINE W REFLEX MICROSCOPIC - Abnormal; Notable for the following components:      Result Value   APPearance HAZY (*)    Hgb urine dipstick LARGE (*)    Protein, ur 30 (*)    Nitrite POSITIVE (*)    Leukocytes,Ua LARGE (*)    RBC / HPF >50 (*)    Bacteria, UA FEW (*)    All other components within normal limits  CBC WITH DIFFERENTIAL/PLATELET - Abnormal; Notable for the following components:   RBC 3.16 (*)    Hemoglobin 8.6 (*)    HCT 29.2 (*)    MCHC 29.5 (*)    RDW 19.7 (*)    Platelets 133 (*)    Lymphs Abs 0.6 (*)    All other components within normal limits  COMPREHENSIVE METABOLIC PANEL - Abnormal; Notable for the following components:   Albumin 3.0 (*)    GFR calc non Af Amer 53 (*)    All other components within normal limits  PROTIME-INR - Abnormal; Notable for the following components:   Prothrombin Time 33.0 (*)    INR 3.4 (*)    All other components within normal limits  URINE CULTURE     EKG None  Radiology DG Ankle 2 Views Left  Result Date: 02/16/2020 CLINICAL DATA:  Left hip and ankle pain.  No reported injury. EXAM: LEFT ANKLE - 2 VIEW COMPARISON:  None. FINDINGS: Mild diffuse soft tissue swelling. Diffuse osteopenia. Probable posterior effusion. No fracture or dislocation seen. Atheromatous arterial calcifications. IMPRESSION: Mild diffuse soft tissue swelling and probable posterior effusion. Electronically Signed   By: Claudie Revering M.D.   On: 02/16/2020 16:43   CT Ankle Left Wo Contrast  Result Date: 02/17/2020 CLINICAL DATA:  Atraumatic left ankle pain EXAM: CT OF THE LEFT ANKLE WITHOUT CONTRAST TECHNIQUE: Multidetector CT imaging of the left ankle was performed according to the standard protocol. Multiplanar CT image reconstructions were also generated. COMPARISON:  02/16/2020 FINDINGS: Bones/Joint/Cartilage The bones are diffusely osteopenic. There are no acute displaced fractures. There is mild osteoarthritis of the tibiotalar joint, with marginal osteophytes seen posteriorly. Ligaments Suboptimally assessed by CT. Muscles and Tendons No gross abnormalities. Soft tissues There is diffuse subcutaneous edema throughout the distal left lower leg, ankle, and hindfoot. No fluid collections. There is extensive atherosclerosis. IMPRESSION: 1. Severe osteopenia. 2. Osteoarthritis of the tibiotalar joint. 3. Diffuse soft tissue edema. Electronically Signed   By: Diana Eves.D.  On: 02/17/2020 03:41   DG Hip Unilat With Pelvis 2-3 Views Left  Result Date: 02/16/2020 CLINICAL DATA:  Left hip and ankle pain.  No reported injury. EXAM: DG HIP (WITH OR WITHOUT PELVIS) 2-3V LEFT COMPARISON:  None. FINDINGS: Left hip prosthesis in satisfactory position and alignment. No fracture or dislocation. Diffuse osteopenia. Single surgical clip at the inferior pubic ramus on the right. IMPRESSION: No acute abnormality. Electronically Signed   By: Claudie Revering M.D.   On: 02/16/2020 16:44     Procedures Procedures Medications Ordered in ED Medications  HYDROcodone-acetaminophen (NORCO/VICODIN) 5-325 MG per tablet 1 tablet (1 tablet Oral Given 02/17/20 0056)    ED Course  I have reviewed the triage vital signs and the nursing notes.  Pertinent labs & imaging results that were available during my care of the patient were reviewed by me and considered in my medical decision making (see chart for details).    MDM Rules/Calculators/A&P                          1:06 AM Patient with complicated history.  Patient has had progressive weakness and difficulty ambulating for several months per wife.  Patient has had indwelling Foley for quite some time  patient just got out of rehab this past week and since being at home has had difficulty ambulating.  After long discussion, it does not appear that there is an acute issue tonight except for difficulty transitioning to home.  He has had this current Foley catheter in place for 3 weeks, this will be changed but he is already on Bactrim for UTI While he is in the bed he has appropriate movement of his legs, though is  weaker on the left.  Previous orthopedic issues limit his movement on the left leg.  It appears when he stands he does have significant back pain which is likely come from spinal stenosis.  He may also be experiencing lumbosacral radiculopathy on the left reporting the numbness of the left leg as well as weakness with plantar flexion Patient has no opiate pain medications at home.  He is on gabapentin.  We will give  dose of Vicodin and reassess 2:52 AM Patient now reports his main issue is left ankle and posterior foot pain.  He reports this did worsen several days ago while doing PT.  All of his other issues have been more of a chronic in nature.  He does have diffuse tenderness around the left ankle and posterior foot.  He does have some mild weakness with plantar and dorsiflexion on the left, but now he reports that is  chronic.  Both his exam and his history have been very challenging.  However due to persistent left ankle pain, will proceed with further imaging.  MRI preferred, but the patient has implantable device, discussed the radiology next best imaging will be CT ankle 4:13 AM CT left ankle without occult fracture.  Patient has no other new complaints.  Patient appears to have chronic anemia.  He has no other pain complaints.  It is possible his left leg and ankle issues are related to radiculopathy as well as arthritis.  Otherwise there are no acute issues.  Patient is requesting discharge home.  They do have in-home nursing and physical therapy.  He reports he is supposed to wear a specially made shoe for the left foot which he will continue.  Patient has not had good pain control on gabapentin.  We will start him on Vicodin Final Clinical Impression(s) / ED Diagnoses Final diagnoses:  Acute left ankle pain  Urinary tract infection associated with indwelling urethral catheter, subsequent encounter  Spinal stenosis, unspecified spinal region    Rx / DC Orders ED Discharge Orders         Ordered    HYDROcodone-acetaminophen (NORCO/VICODIN) 5-325 MG tablet  Every 6 hours PRN     Discontinue  Reprint     02/17/20 0409           Ripley Fraise, MD 02/17/20 [phone]

## 2020-02-17 NOTE — Discharge Instructions (Addendum)
Be sure to wear the specially made shoe for the  left foot.  You can continue with your physical therapy. I can refer you to a spine specialist for further evaluation of your back pain and spinal stenosis

## 2020-02-19 ENCOUNTER — Other Ambulatory Visit: Payer: Medicare Other

## 2020-02-19 ENCOUNTER — Ambulatory Visit: Payer: Medicare Other | Admitting: Hematology

## 2020-02-19 LAB — URINE CULTURE: Culture: >=100000 — AB

## 2020-02-20 ENCOUNTER — Telehealth: Payer: Self-pay | Admitting: *Deleted

## 2020-02-20 NOTE — Telephone Encounter (Signed)
Post ED Visit - Positive Culture Follow-up  Culture report reviewed by antimicrobial stewardship pharmacist: [place] Team []  Elenor Quinones, Pharm.D. []  Heide Guile, Pharm.D., BCPS AQ-ID []  Parks Neptune, Pharm.D., BCPS []  Alycia Rossetti, Pharm.D., BCPS []  [place], [place].D., BCPS, AAHIVP []  Legrand Como, Pharm.D., BCPS, AAHIVP []  Salome Arnt, PharmD, BCPS []  Johnnette Gourd, PharmD, BCPS []  Hughes Better, PharmD, BCPS []  Leeroy Cha, PharmD []  Laqueta Linden, PharmD, BCPS []  Albertina Parr, PharmD  [place] Team []  Leodis Sias, PharmD []  Lindell Spar, PharmD []  Royetta Asal, PharmD []  Graylin Shiver, Rph []  Rema Fendt) Glennon Mac, PharmD []  Arlyn Dunning, PharmD []  Netta Cedars, PharmD []  Dia Sitter, PharmD []  Leone Haven, PharmD []  Gretta Arab, PharmD []  Theodis Shove, PharmD []  Peggyann Juba, PharmD []  Reuel Boom, PharmD   Positive urine culture Treated with Sulfamethoxazole Trimethoprim, organism sensitive to the same.  Continuing Bactrim, no UTI symptoms at present.  Followed by [facility] and has follow up appt with PCP on 02/26/2020  Ardeen Fillers 02/20/2020, 12:12 PM

## 2020-02-20 NOTE — Progress Notes (Signed)
ED Antimicrobial Stewardship Positive Culture Follow Up   [name] is an 86 y.o. male who presented to [facility] on 02/16/2020 with a chief complaint of  Chief Complaint  Patient presents with   Hip Pain   Knee Pain   Ankle Pain   Foley catheter problem    Recent Results (from the past 720 hour(s))  Urine culture     Status: Abnormal   Collection Time: 02/17/20  1:13 AM   Specimen: Urine, Catheterized  Result Value Ref Range Status   Specimen Description URINE, CATHETERIZED  Final   Special Requests   Final    NONE Performed at [facility], [address], [place], [place] [address]    Culture (A)  Final    >=100,000 COLONIES/mL KLEBSIELLA PNEUMONIAE >=100,000 COLONIES/mL ENTEROCOCCUS FAECALIS    Report Status 02/19/2020 FINAL  Final   Organism ID, Bacteria KLEBSIELLA PNEUMONIAE (A)  Final   Organism ID, Bacteria ENTEROCOCCUS FAECALIS (A)  Final      Susceptibility   Enterococcus faecalis - MIC*    AMPICILLIN <=2 SENSITIVE Sensitive     NITROFURANTOIN <=16 SENSITIVE Sensitive     VANCOMYCIN 2 SENSITIVE Sensitive     * >=100,000 COLONIES/mL ENTEROCOCCUS FAECALIS   Klebsiella pneumoniae - MIC*    AMPICILLIN >=32 RESISTANT Resistant     CEFAZOLIN 16 SENSITIVE Sensitive     CEFTRIAXONE <=0.25 SENSITIVE Sensitive     CIPROFLOXACIN <=0.25 SENSITIVE Sensitive     GENTAMICIN <=1 SENSITIVE Sensitive     IMIPENEM <=0.25 SENSITIVE Sensitive     NITROFURANTOIN 64 INTERMEDIATE Intermediate     TRIMETH/SULFA <=20 SENSITIVE Sensitive     AMPICILLIN/SULBACTAM >=32 RESISTANT Resistant     PIP/TAZO 32 INTERMEDIATE Intermediate     * >=100,000 COLONIES/mL KLEBSIELLA PNEUMONIAE    [x]  Treated with Bactrim, organism resistant to prescribed antimicrobial  New antibiotic prescription: Flow Manager to call patient. If patient not experiencing urinary symptoms/further foley catheter issues/AMS, then continue current therapy with Bactrim to completion. If patient experiencing  further symptoms (urinary/foley issues or AMS), then continue Bactrim and start Amoxicillin 875mg  PO q12h x 5 days.  ED Provider: Kennith Maes, PA-C   [name] 02/20/2020, 7:59 AM Clinical Pharmacist Monday - Friday phone -  [phone] Saturday - Sunday phone - [phone]

## 2020-02-26 ENCOUNTER — Ambulatory Visit: Payer: Medicare Other | Admitting: Hematology

## 2020-02-26 ENCOUNTER — Other Ambulatory Visit: Payer: Medicare Other

## 2020-03-15 ENCOUNTER — Ambulatory Visit (INDEPENDENT_AMBULATORY_CARE_PROVIDER_SITE_OTHER): Payer: Medicare Other | Admitting: *Deleted

## 2020-03-15 DIAGNOSIS — R001 Bradycardia, unspecified: Secondary | ICD-10-CM | POA: Diagnosis not present

## 2020-03-17 LAB — CUP PACEART REMOTE DEVICE CHECK
Battery Impedance: 4154 Ohm
Battery Remaining Longevity: 8 mo
Battery Voltage: 2.68 V
Brady Statistic AP VP Percent: 99 %
Brady Statistic AP VS Percent: 1 %
Brady Statistic AS VP Percent: 0 %
Brady Statistic AS VS Percent: 0 %
Date Time Interrogation Session: 20210820142151
Implantable Lead Implant Date: 20120815
Implantable Lead Implant Date: 20120815
Implantable Lead Location: 753859
Implantable Lead Location: 753860
Implantable Lead Model: 5076
Implantable Lead Model: 5076
Implantable Pulse Generator Implant Date: 20120815
Lead Channel Impedance Value: 470 Ohm
Lead Channel Impedance Value: 476 Ohm
Lead Channel Pacing Threshold Amplitude: 0.875 V
Lead Channel Pacing Threshold Pulse Width: 0.4 ms
Lead Channel Setting Pacing Amplitude: 2.5 V
Lead Channel Setting Pacing Amplitude: 2.5 V
Lead Channel Setting Pacing Pulse Width: 0.4 ms
Lead Channel Setting Sensing Sensitivity: 2 mV

## 2020-03-18 ENCOUNTER — Telehealth: Payer: Self-pay

## 2020-03-18 NOTE — Telephone Encounter (Signed)
Spoke with pt spouse advised battery <88months until ERI, updating remote check frequency to monthly.  Next check will be 9/23.

## 2020-03-18 NOTE — Progress Notes (Signed)
Remote pacemaker transmission.   

## 2020-03-20 ENCOUNTER — Ambulatory Visit: Payer: Medicare Other | Admitting: Cardiology

## 2020-04-15 ENCOUNTER — Telehealth: Payer: Self-pay | Admitting: Internal Medicine

## 2020-04-15 NOTE — Telephone Encounter (Signed)
New message   Wife called in stated pt needs to cancel future appts do to Hospice will be doing all fu's.  She stated that she has Monitor at home and would like to know she needs to do with it   Best number - [phone]

## 2020-04-15 NOTE — Telephone Encounter (Signed)
Wife called and she does not want to continue remote monitoring due to patient's declining health and Hospice involvement. She reports that she wishes that the PCP would follow him for all care . Explained that remote transmission could be continued while in hospice without in person visits. Education done that patient has 8 months left until his battery reaches ERI and he is dependent on the ppm. She would like to return remote monitor and no longer send remotes. Informed that Dr Rayann Heman will be notified and we will send a return kit if he has no further recommendations for the plan of care for the patient.

## 2020-04-21 NOTE — Telephone Encounter (Signed)
We will respect their wishes and be available as needed.

## 2020-04-24 ENCOUNTER — Other Ambulatory Visit: Payer: Self-pay | Admitting: Internal Medicine

## 2020-04-24 DIAGNOSIS — N183 Chronic kidney disease, stage 3 unspecified: Secondary | ICD-10-CM

## 2020-04-27 ENCOUNTER — Other Ambulatory Visit: Payer: Self-pay | Admitting: Internal Medicine

## 2020-04-30 NOTE — Telephone Encounter (Signed)
This is a [place] pt °

## 2020-05-31 ENCOUNTER — Encounter: Payer: Medicare Other | Admitting: Internal Medicine

## 2020-06-26 DEATH — deceased

## 2020-08-29 ENCOUNTER — Telehealth: Payer: Self-pay | Admitting: Internal Medicine

## 2020-08-29 NOTE — Telephone Encounter (Signed)
Pt's wife called stating this pt is deceased and she would like to know what she needs to do with his home box to check his device    Please call his wife

## 2020-08-29 NOTE — Telephone Encounter (Signed)
LVM for pt spouse advising that a return kit has been requested from [facility] phone number provided to callback if any further questions.

## 2022-03-28 IMAGING — CR DG ANKLE 2V *L*
3 series · 3 of 3 positions shown · non-contrast
Comparison: None.

CLINICAL DATA: Left hip and ankle pain.  No reported injury.

EXAM:
LEFT ANKLE - 2 VIEW

[ankle ap (1 of 2)]
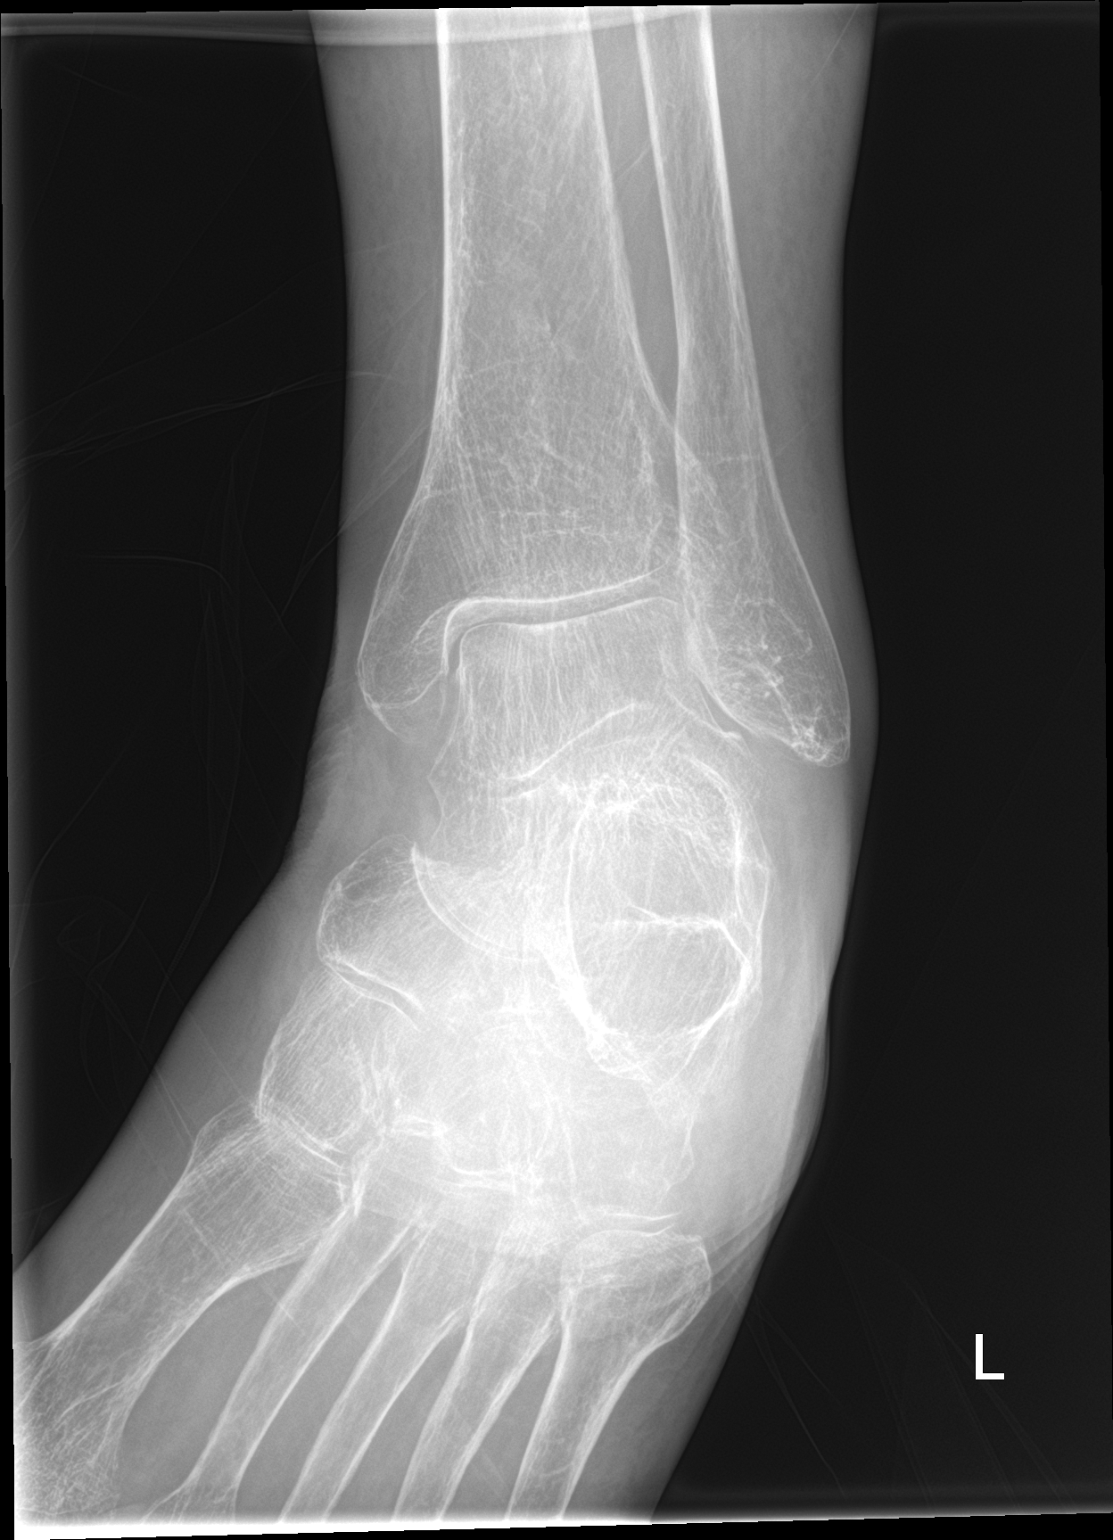

[ankle lat]
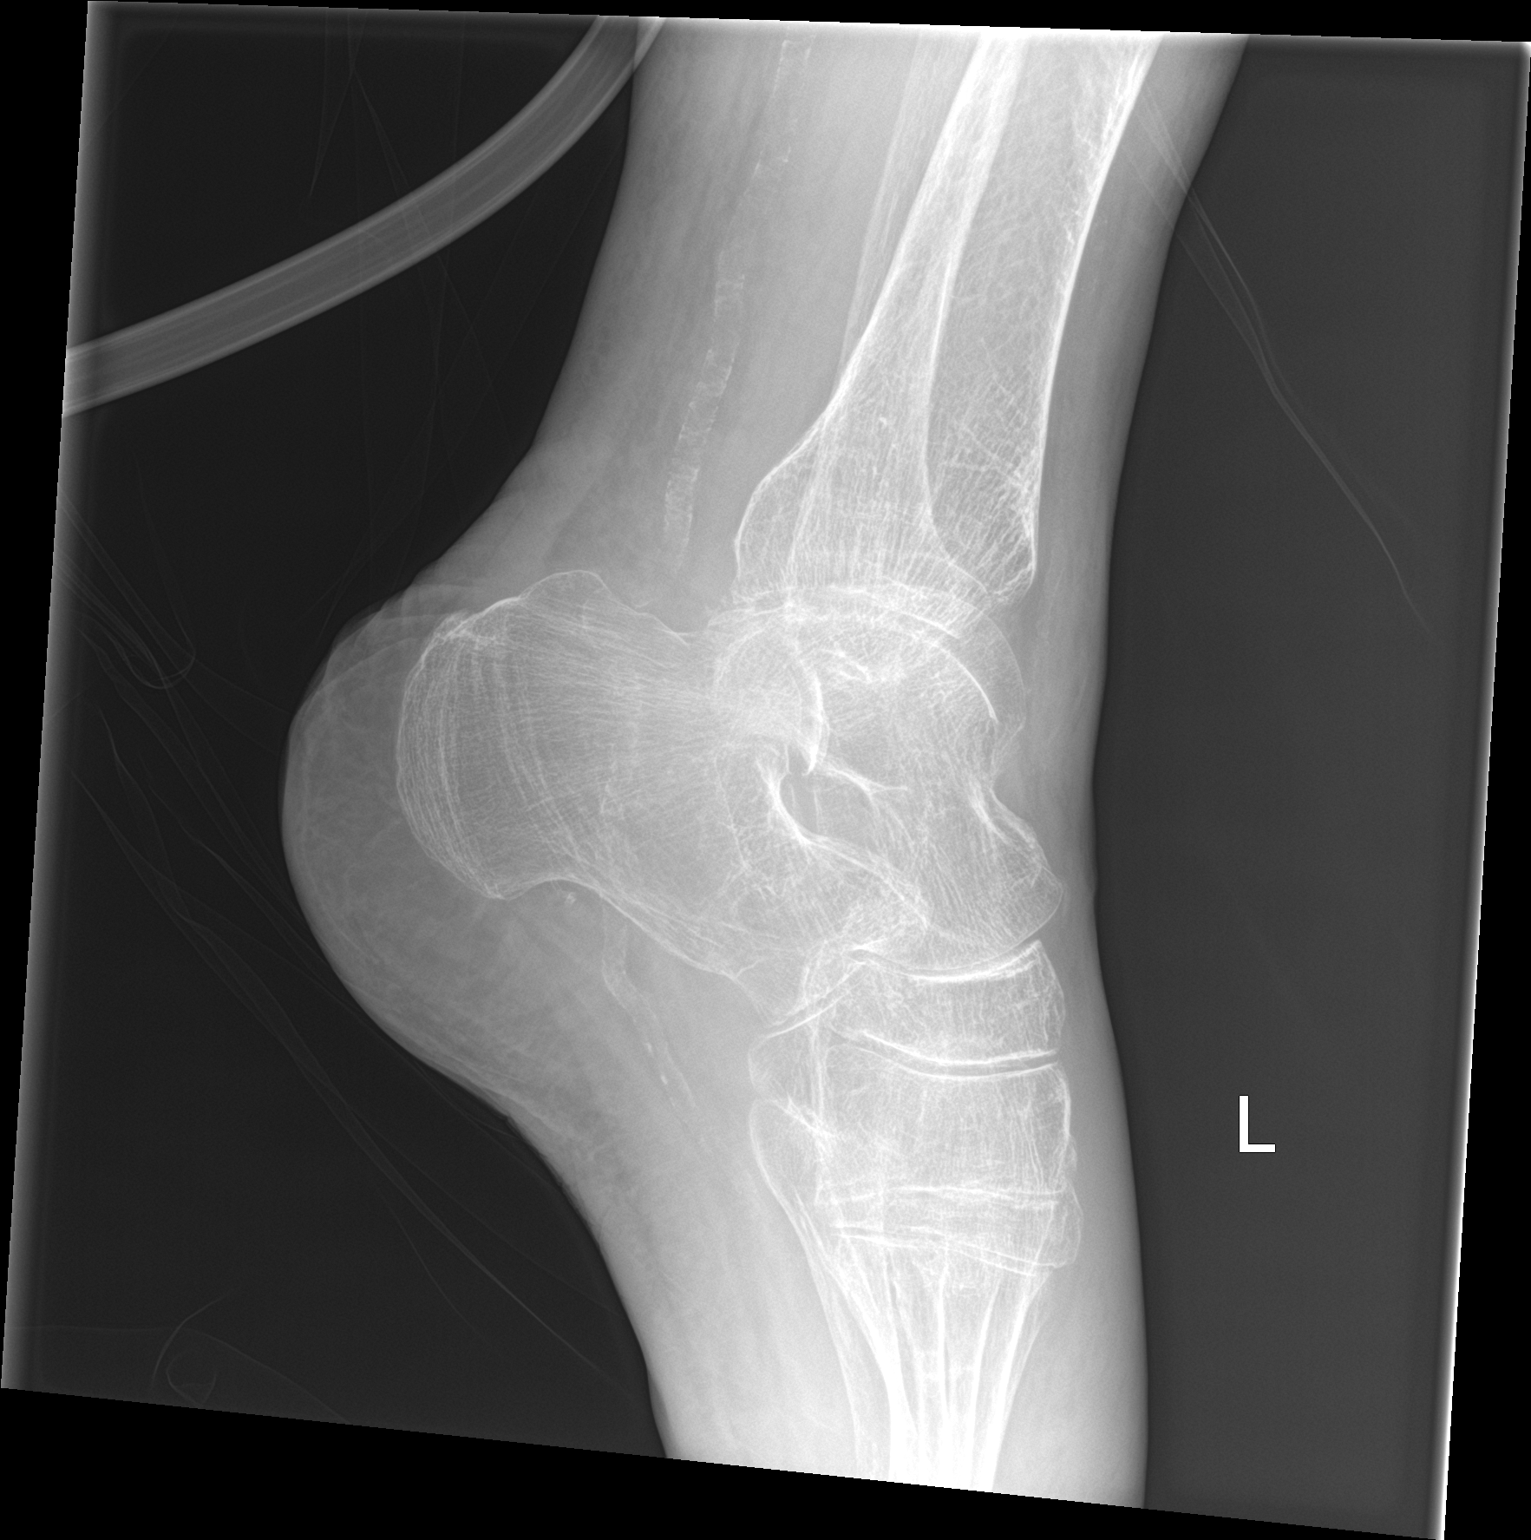

[ankle ap (2 of 2)]
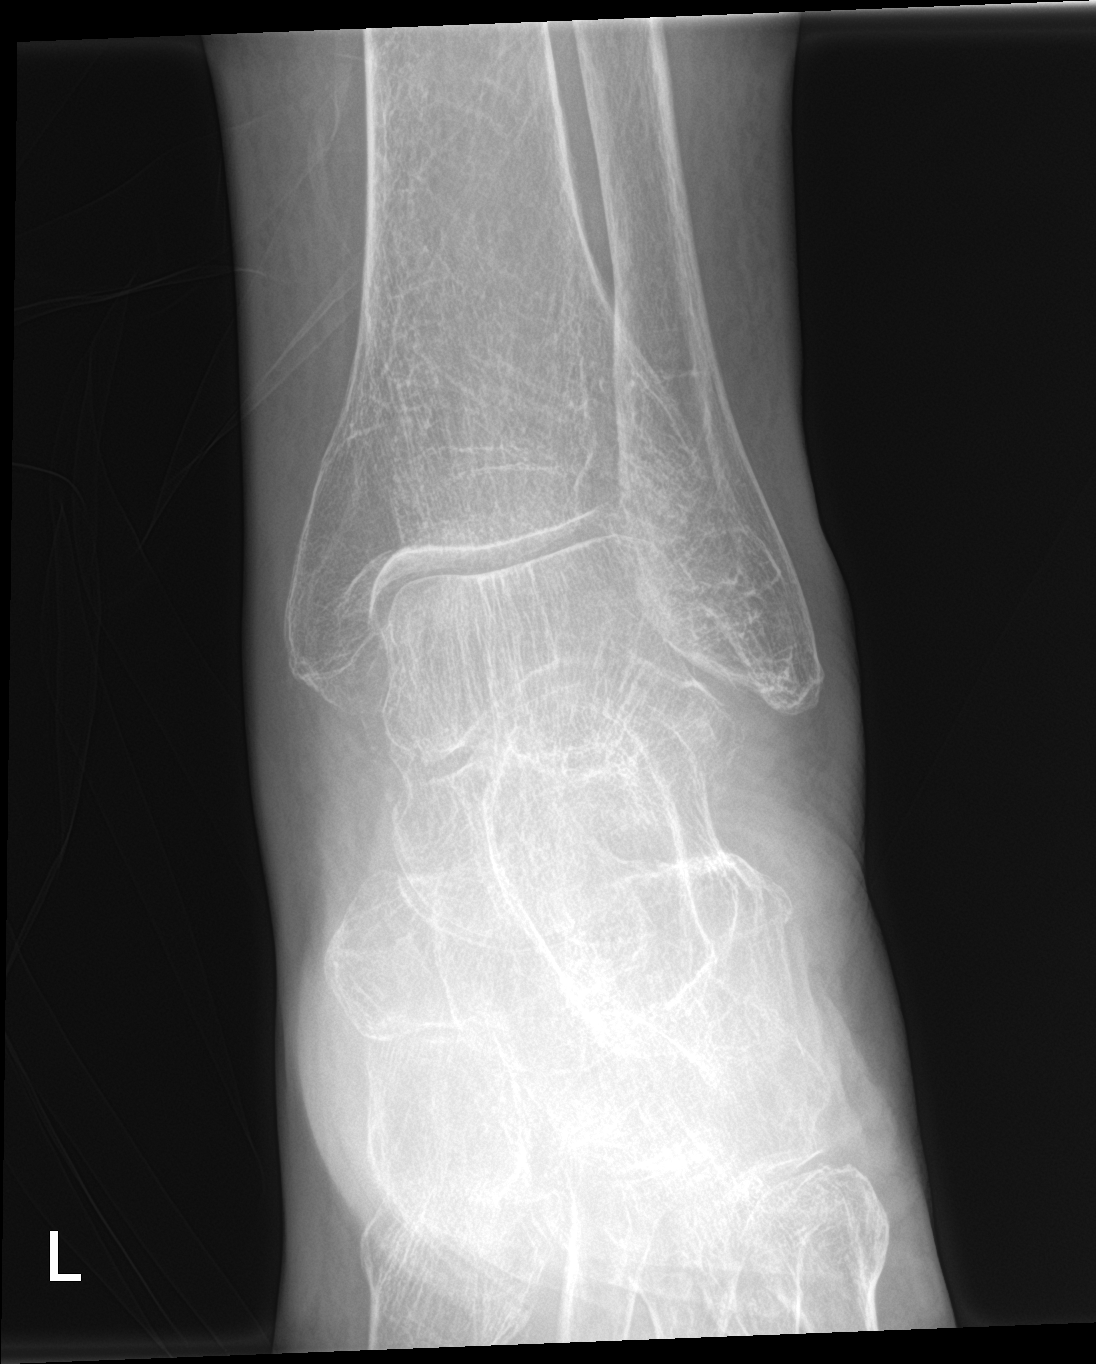

[3 of 3 positions shown; findings below may reference images not displayed]

FINDINGS: Mild diffuse soft tissue swelling. Diffuse osteopenia. Probable
posterior effusion. No fracture or dislocation seen. Atheromatous
arterial calcifications.
IMPRESSION: Mild diffuse soft tissue swelling and probable posterior effusion.
